# Patient Record
Sex: Female | Born: 1950
Health system: Southern US, Community
[De-identification: ages and names within clinical notes are randomized; demographics above are authoritative.]

## PROBLEM LIST (undated history)

## (undated) DIAGNOSIS — D649 Anemia, unspecified: Secondary | ICD-10-CM

## (undated) DIAGNOSIS — F32A Depression, unspecified: Secondary | ICD-10-CM

## (undated) DIAGNOSIS — T7840XA Allergy, unspecified, initial encounter: Secondary | ICD-10-CM

## (undated) DIAGNOSIS — E119 Type 2 diabetes mellitus without complications: Secondary | ICD-10-CM

## (undated) DIAGNOSIS — E785 Hyperlipidemia, unspecified: Secondary | ICD-10-CM

## (undated) DIAGNOSIS — I10 Essential (primary) hypertension: Secondary | ICD-10-CM

## (undated) DIAGNOSIS — N189 Chronic kidney disease, unspecified: Secondary | ICD-10-CM

## (undated) DIAGNOSIS — F329 Major depressive disorder, single episode, unspecified: Secondary | ICD-10-CM

## (undated) DIAGNOSIS — D631 Anemia in chronic kidney disease: Secondary | ICD-10-CM

## (undated) HISTORY — DX: Essential (primary) hypertension: I10

## (undated) HISTORY — DX: Chronic kidney disease, unspecified: N18.9

## (undated) HISTORY — DX: Allergy, unspecified, initial encounter: T78.40XA

## (undated) HISTORY — DX: Depression, unspecified: F32.A

## (undated) HISTORY — DX: Major depressive disorder, single episode, unspecified: F32.9

## (undated) HISTORY — DX: Hyperlipidemia, unspecified: E78.5

## (undated) HISTORY — DX: Anemia in chronic kidney disease: D63.1

## (undated) HISTORY — DX: Anemia, unspecified: D64.9

## (undated) HISTORY — PX: TUBAL LIGATION: SHX77

---

## 1999-12-31 ENCOUNTER — Emergency Department (HOSPITAL_COMMUNITY): Admission: EM | Admit: 1999-12-31 | Discharge: 1999-12-31 | Payer: Self-pay | Admitting: Emergency Medicine

## 2000-04-23 ENCOUNTER — Emergency Department (HOSPITAL_COMMUNITY): Admission: EM | Admit: 2000-04-23 | Discharge: 2000-04-23 | Payer: Self-pay | Admitting: Emergency Medicine

## 2000-05-08 ENCOUNTER — Encounter: Admission: RE | Admit: 2000-05-08 | Discharge: 2000-05-08 | Payer: Self-pay | Admitting: Internal Medicine

## 2001-12-27 ENCOUNTER — Emergency Department (HOSPITAL_COMMUNITY): Admission: EM | Admit: 2001-12-27 | Discharge: 2001-12-27 | Payer: Self-pay

## 2003-12-18 ENCOUNTER — Ambulatory Visit (HOSPITAL_COMMUNITY): Admission: RE | Admit: 2003-12-18 | Discharge: 2003-12-18 | Payer: Self-pay | Admitting: Internal Medicine

## 2004-02-25 ENCOUNTER — Ambulatory Visit: Payer: Self-pay | Admitting: *Deleted

## 2004-03-23 ENCOUNTER — Ambulatory Visit: Payer: Self-pay | Admitting: Family Medicine

## 2004-05-23 ENCOUNTER — Emergency Department (HOSPITAL_COMMUNITY): Admission: EM | Admit: 2004-05-23 | Discharge: 2004-05-23 | Payer: Self-pay | Admitting: Emergency Medicine

## 2004-05-28 ENCOUNTER — Ambulatory Visit: Payer: Self-pay | Admitting: Family Medicine

## 2004-06-08 ENCOUNTER — Ambulatory Visit: Payer: Self-pay | Admitting: Family Medicine

## 2004-08-06 ENCOUNTER — Ambulatory Visit: Payer: Self-pay | Admitting: Family Medicine

## 2004-08-11 ENCOUNTER — Encounter (HOSPITAL_BASED_OUTPATIENT_CLINIC_OR_DEPARTMENT_OTHER): Admission: RE | Admit: 2004-08-11 | Discharge: 2004-09-10 | Payer: Self-pay | Admitting: Internal Medicine

## 2005-01-14 ENCOUNTER — Ambulatory Visit: Payer: Self-pay | Admitting: Nurse Practitioner

## 2005-07-06 ENCOUNTER — Ambulatory Visit: Payer: Self-pay | Admitting: Family Medicine

## 2005-11-07 ENCOUNTER — Ambulatory Visit: Payer: Self-pay | Admitting: Family Medicine

## 2006-06-06 ENCOUNTER — Ambulatory Visit: Payer: Self-pay | Admitting: Family Medicine

## 2007-01-11 ENCOUNTER — Ambulatory Visit: Payer: Self-pay | Admitting: Family Medicine

## 2007-01-11 ENCOUNTER — Encounter (INDEPENDENT_AMBULATORY_CARE_PROVIDER_SITE_OTHER): Payer: Self-pay | Admitting: Nurse Practitioner

## 2007-01-11 LAB — CONVERTED CEMR LAB
Albumin: 4 g/dL (ref 3.5–5.2)
Basophils Absolute: 0 10*3/uL (ref 0.0–0.1)
Basophils Relative: 0 % (ref 0–1)
Chloride: 103 meq/L (ref 96–112)
Creatinine, Ser: 0.8 mg/dL (ref 0.40–1.20)
Eosinophils Relative: 2 % (ref 0–5)
Glucose, Bld: 223 mg/dL — ABNORMAL HIGH (ref 70–99)
Lymphocytes Relative: 34 % (ref 12–46)
Lymphs Abs: 1.9 10*3/uL (ref 0.7–3.3)
Neutro Abs: 3.2 10*3/uL (ref 1.7–7.7)
Platelets: 130 10*3/uL — ABNORMAL LOW (ref 150–400)
Sodium: 141 meq/L (ref 135–145)
Total Bilirubin: 0.9 mg/dL (ref 0.3–1.2)

## 2007-01-31 ENCOUNTER — Ambulatory Visit: Payer: Self-pay | Admitting: Internal Medicine

## 2007-02-27 DIAGNOSIS — N949 Unspecified condition associated with female genital organs and menstrual cycle: Secondary | ICD-10-CM

## 2007-02-27 DIAGNOSIS — N925 Other specified irregular menstruation: Secondary | ICD-10-CM | POA: Insufficient documentation

## 2007-02-27 DIAGNOSIS — E669 Obesity, unspecified: Secondary | ICD-10-CM

## 2007-02-27 DIAGNOSIS — G609 Hereditary and idiopathic neuropathy, unspecified: Secondary | ICD-10-CM | POA: Insufficient documentation

## 2007-02-27 DIAGNOSIS — E118 Type 2 diabetes mellitus with unspecified complications: Secondary | ICD-10-CM

## 2007-02-27 DIAGNOSIS — E785 Hyperlipidemia, unspecified: Secondary | ICD-10-CM | POA: Insufficient documentation

## 2007-02-27 DIAGNOSIS — I10 Essential (primary) hypertension: Secondary | ICD-10-CM

## 2007-03-07 ENCOUNTER — Encounter (INDEPENDENT_AMBULATORY_CARE_PROVIDER_SITE_OTHER): Payer: Self-pay | Admitting: *Deleted

## 2007-03-09 ENCOUNTER — Ambulatory Visit: Payer: Self-pay | Admitting: Internal Medicine

## 2007-04-04 ENCOUNTER — Ambulatory Visit: Payer: Self-pay | Admitting: Internal Medicine

## 2007-09-16 ENCOUNTER — Emergency Department (HOSPITAL_COMMUNITY): Admission: EM | Admit: 2007-09-16 | Discharge: 2007-09-16 | Payer: Self-pay | Admitting: Emergency Medicine

## 2007-09-18 ENCOUNTER — Ambulatory Visit: Payer: Self-pay | Admitting: Internal Medicine

## 2008-04-06 ENCOUNTER — Emergency Department (HOSPITAL_COMMUNITY): Admission: EM | Admit: 2008-04-06 | Discharge: 2008-04-06 | Payer: Self-pay | Admitting: Emergency Medicine

## 2008-04-17 ENCOUNTER — Inpatient Hospital Stay (HOSPITAL_COMMUNITY): Admission: AD | Admit: 2008-04-17 | Discharge: 2008-04-25 | Payer: Self-pay | Admitting: Cardiology

## 2008-04-17 ENCOUNTER — Encounter: Payer: Self-pay | Admitting: Emergency Medicine

## 2008-04-18 ENCOUNTER — Encounter (INDEPENDENT_AMBULATORY_CARE_PROVIDER_SITE_OTHER): Payer: Self-pay | Admitting: Cardiology

## 2008-05-29 ENCOUNTER — Encounter (INDEPENDENT_AMBULATORY_CARE_PROVIDER_SITE_OTHER): Payer: Self-pay | Admitting: Internal Medicine

## 2008-05-29 ENCOUNTER — Ambulatory Visit: Payer: Self-pay | Admitting: Family Medicine

## 2008-05-29 LAB — CONVERTED CEMR LAB
Basophils Relative: 0 % (ref 0–1)
Eosinophils Absolute: 0.2 10*3/uL (ref 0.0–0.7)
HCT: 37.2 % (ref 36.0–46.0)
Hemoglobin: 11.8 g/dL — ABNORMAL LOW (ref 12.0–15.0)
Lymphocytes Relative: 19 % (ref 12–46)
MCV: 70.6 fL — ABNORMAL LOW (ref 78.0–100.0)
Platelets: 168 10*3/uL (ref 150–400)
RBC: 5.27 M/uL — ABNORMAL HIGH (ref 3.87–5.11)
WBC: 11 10*3/uL — ABNORMAL HIGH (ref 4.0–10.5)

## 2008-06-05 ENCOUNTER — Ambulatory Visit: Payer: Self-pay | Admitting: Internal Medicine

## 2009-04-14 ENCOUNTER — Encounter (INDEPENDENT_AMBULATORY_CARE_PROVIDER_SITE_OTHER): Payer: Self-pay | Admitting: Internal Medicine

## 2009-04-14 ENCOUNTER — Ambulatory Visit: Payer: Self-pay | Admitting: Internal Medicine

## 2009-04-24 ENCOUNTER — Encounter (INDEPENDENT_AMBULATORY_CARE_PROVIDER_SITE_OTHER): Payer: Self-pay | Admitting: Internal Medicine

## 2009-04-24 ENCOUNTER — Ambulatory Visit: Payer: Self-pay | Admitting: Internal Medicine

## 2009-04-24 LAB — CONVERTED CEMR LAB
Cholesterol: 260 mg/dL — ABNORMAL HIGH (ref 0–200)
HDL: 63 mg/dL (ref >39)
LDL Cholesterol: 163 mg/dL — ABNORMAL HIGH (ref 0–99)
Total CHOL/HDL Ratio: 4.1
Triglycerides: 168 mg/dL — ABNORMAL HIGH (ref <150)
VLDL: 34 mg/dL (ref 0–40)

## 2009-04-29 ENCOUNTER — Ambulatory Visit: Payer: Self-pay | Admitting: Internal Medicine

## 2009-08-21 ENCOUNTER — Inpatient Hospital Stay (HOSPITAL_COMMUNITY): Admission: EM | Admit: 2009-08-21 | Discharge: 2009-08-24 | Payer: Self-pay | Admitting: Emergency Medicine

## 2009-08-21 ENCOUNTER — Ambulatory Visit: Payer: Self-pay | Admitting: Cardiology

## 2010-01-28 ENCOUNTER — Ambulatory Visit: Payer: Self-pay | Admitting: Internal Medicine

## 2010-09-12 LAB — CARDIAC PANEL(CRET KIN+CKTOT+MB+TROPI)
CK, MB: 0.7 ng/mL (ref 0.3–4.0)
CK, MB: 0.8 ng/mL (ref 0.3–4.0)
CK, MB: 0.9 ng/mL (ref 0.3–4.0)
CK, MB: 1 ng/mL (ref 0.3–4.0)
Relative Index: 0.8 (ref 0.0–2.5)
Relative Index: INVALID (ref 0.0–2.5)
Total CK: 105 U/L (ref 7–177)
Total CK: 86 U/L (ref 7–177)
Troponin I: 0.09 ng/mL — ABNORMAL HIGH (ref 0.00–0.06)
Troponin I: 0.1 ng/mL — ABNORMAL HIGH (ref 0.00–0.06)

## 2010-09-12 LAB — GLUCOSE, CAPILLARY
Glucose-Capillary: 137 mg/dL — ABNORMAL HIGH (ref 70–99)
Glucose-Capillary: 144 mg/dL — ABNORMAL HIGH (ref 70–99)
Glucose-Capillary: 216 mg/dL — ABNORMAL HIGH (ref 70–99)
Glucose-Capillary: 229 mg/dL — ABNORMAL HIGH (ref 70–99)
Glucose-Capillary: 247 mg/dL — ABNORMAL HIGH (ref 70–99)
Glucose-Capillary: 261 mg/dL — ABNORMAL HIGH (ref 70–99)
Glucose-Capillary: 272 mg/dL — ABNORMAL HIGH (ref 70–99)
Glucose-Capillary: 281 mg/dL — ABNORMAL HIGH (ref 70–99)
Glucose-Capillary: 333 mg/dL — ABNORMAL HIGH (ref 70–99)
Glucose-Capillary: 371 mg/dL — ABNORMAL HIGH (ref 70–99)
Glucose-Capillary: 382 mg/dL — ABNORMAL HIGH (ref 70–99)

## 2010-09-12 LAB — COMPREHENSIVE METABOLIC PANEL
ALT: 17 U/L (ref 0–35)
Albumin: 2.8 g/dL — ABNORMAL LOW (ref 3.5–5.2)
Alkaline Phosphatase: 75 U/L (ref 39–117)
BUN: 16 mg/dL (ref 6–23)
BUN: 16 mg/dL (ref 6–23)
BUN: 25 mg/dL — ABNORMAL HIGH (ref 6–23)
CO2: 27 mEq/L (ref 19–32)
Calcium: 8.3 mg/dL — ABNORMAL LOW (ref 8.4–10.5)
Calcium: 9 mg/dL (ref 8.4–10.5)
Chloride: 102 mEq/L (ref 96–112)
Creatinine, Ser: 0.87 mg/dL (ref 0.4–1.2)
Creatinine, Ser: 1.36 mg/dL — ABNORMAL HIGH (ref 0.4–1.2)
GFR calc non Af Amer: 46 mL/min — ABNORMAL LOW (ref >60)
Glucose, Bld: 264 mg/dL — ABNORMAL HIGH (ref 70–99)
Glucose, Bld: 316 mg/dL — ABNORMAL HIGH (ref 70–99)
Potassium: 3.4 mEq/L — ABNORMAL LOW (ref 3.5–5.1)
Potassium: 3.4 mEq/L — ABNORMAL LOW (ref 3.5–5.1)
Sodium: 135 mEq/L (ref 135–145)
Sodium: 136 mEq/L (ref 135–145)
Total Bilirubin: 1.2 mg/dL (ref 0.3–1.2)

## 2010-09-12 LAB — CBC
HCT: 38.5 % (ref 36.0–46.0)
Hemoglobin: 11.2 g/dL — ABNORMAL LOW (ref 12.0–15.0)
Hemoglobin: 11.9 g/dL — ABNORMAL LOW (ref 12.0–15.0)
Hemoglobin: 13 g/dL (ref 12.0–15.0)
MCHC: 30.4 g/dL (ref 30.0–36.0)
MCHC: 30.9 g/dL (ref 30.0–36.0)
MCHC: 32.9 g/dL (ref 30.0–36.0)
MCV: 71.6 fL — ABNORMAL LOW (ref 78.0–100.0)
MCV: 72.3 fL — ABNORMAL LOW (ref 78.0–100.0)
RBC: 4.81 MIL/uL (ref 3.87–5.11)
RBC: 5.27 MIL/uL — ABNORMAL HIGH (ref 3.87–5.11)
RDW: 15.2 % (ref 11.5–15.5)
RDW: 16.1 % — ABNORMAL HIGH (ref 11.5–15.5)
WBC: 6.8 10*3/uL (ref 4.0–10.5)

## 2010-09-12 LAB — BASIC METABOLIC PANEL
BUN: 16 mg/dL (ref 6–23)
CO2: 25 mEq/L (ref 19–32)
Calcium: 8.4 mg/dL (ref 8.4–10.5)
Creatinine, Ser: 0.76 mg/dL (ref 0.4–1.2)
Creatinine, Ser: 0.97 mg/dL (ref 0.4–1.2)
GFR calc Af Amer: >60 mL/min (ref >60)
Glucose, Bld: 206 mg/dL — ABNORMAL HIGH (ref 70–99)
Potassium: 3.5 mEq/L (ref 3.5–5.1)

## 2010-09-12 LAB — DIFFERENTIAL
Basophils Absolute: 0 10*3/uL (ref 0.0–0.1)
Basophils Absolute: 0.1 10*3/uL (ref 0.0–0.1)
Basophils Relative: 0 % (ref 0–1)
Eosinophils Absolute: 0.1 10*3/uL (ref 0.0–0.7)
Eosinophils Absolute: 0.1 10*3/uL (ref 0.0–0.7)
Eosinophils Absolute: 0.1 10*3/uL (ref 0.0–0.7)
Eosinophils Relative: 1 % (ref 0–5)
Lymphocytes Relative: 28 % (ref 12–46)
Lymphs Abs: 1.9 10*3/uL (ref 0.7–4.0)
Monocytes Absolute: 0.8 10*3/uL (ref 0.1–1.0)
Monocytes Relative: 10 % (ref 3–12)
Monocytes Relative: 11 % (ref 3–12)
Neutro Abs: 4 10*3/uL (ref 1.7–7.7)
Neutro Abs: 4.1 10*3/uL (ref 1.7–7.7)
Neutro Abs: 4.7 10*3/uL (ref 1.7–7.7)
Neutrophils Relative %: 59 % (ref 43–77)
Neutrophils Relative %: 61 % (ref 43–77)
Neutrophils Relative %: 61 % (ref 43–77)

## 2010-09-12 LAB — FOLATE: Folate: >20 ng/mL

## 2010-09-12 LAB — IRON AND TIBC
Saturation Ratios: 25 % (ref 20–55)
TIBC: 283 ug/dL (ref 250–470)

## 2010-09-12 LAB — MRSA PCR SCREENING: MRSA by PCR: NEGATIVE

## 2010-09-12 LAB — LIPID PANEL
HDL: 57 mg/dL (ref >39)
Triglycerides: 116 mg/dL (ref <150)
VLDL: 23 mg/dL (ref 0–40)

## 2010-09-12 LAB — POCT CARDIAC MARKERS

## 2010-09-12 LAB — TSH: TSH: 1.757 u[IU]/mL (ref 0.350–4.500)

## 2010-09-12 LAB — HEMOGLOBIN A1C: Hgb A1c MFr Bld: 14.4 % — ABNORMAL HIGH (ref 4.6–6.1)

## 2010-09-12 LAB — FERRITIN: Ferritin: 214 ng/mL (ref 10–291)

## 2010-11-02 NOTE — Consult Note (Signed)
NAMELAMETRIA, BIGALKE             ACCOUNT NO.:  [id number]   MEDICAL RECORD NO.:  [id number]          PATIENT TYPE:  INP   LOCATION:  [id number]                         FACILITY:  [facility]   PHYSICIAN:  Felizardo Hoffmann, M.D.  DATE OF BIRTH:  [birth year]   DATE OF CONSULTATION:  04/17/2008  DATE OF DISCHARGE:                                 CONSULTATION   REQUESTING PHYSICIAN:  Bryson Dames, M.D. of cardiology.   REASON FOR CONSULTATION:  Psychosis, agitation.   HISTORY OF PRESENT ILLNESS:  Phyllis Hernandez is a 60 year old female admitted  to the [facility] on October 29 due to chest pain, hyperglycemia and  hypertensive crisis.   She developed 4 days of acute mental status changes.  Her CBG was  initially 400.  She has been severely agitated, throwing objects.  She  has been making bizarre statements of delusions with ideas of reference  about channel 2 news but this has waxed and waned with her being calm 1  minute and then threatening the nurses the next minute.   Prior to admission her daughter found squalor at home where normally the  patient has things very neat.  The patient also was demonstrating very  poor self-care.  She was not taking her medications for days.  She had  developed paranoia.   FAMILY PSYCHIATRIC HISTORY:  None known.   PAST PSYCHIATRIC HISTORY:  The husband reported that 10 years prior the  patient had a major breakdown.  She does have a history of anxiety and  depression.   SOCIAL HISTORY:  Separated from husband.  No known alcohol or illegal  drugs.   PAST MEDICAL HISTORY:  No known drug allergies.  Hypertension, diabetes  mellitus.   MEDICATIONS:  Psychotropics.  MAR is reviewed.  Ambien 10 mg q.h.s.  p.r.n.   LABORATORY DATA:  Unremarkable.  Her QTC on serial EKGs has been running  between 500 and 520 milliseconds.  CT of the brain without contrast  showed no acute abnormalities.   REVIEW OF SYSTEMS:  Noncontributory.   PHYSICAL EXAMINATION:   VITAL SIGNS:  Temperature 98.4, pulse 114,  respiratory rate 32, blood pressure 225/118, O2 saturation on 2 liters  100%.  MENTAL STATUS:  Ms. Ross is lying in a supine position at the time of  exam with no abnormal involuntary movements.  She is mostly mute.  She  states that the month is September.  She is clearly having  hallucinations.  Thought process - disorganized; when asked what year it  is she states my year.  When she is asked about what month it is she  states [facility].  Her insight and judgment are impaired.  Her affect is  anxious.  Mood is anxious.  Memory is grossly impaired.  She does have  clouding of consciousness.   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  AXIS II:  Deferred.  AXIS III:  See past medical history.  AXIS IV:  General medical.  AXIS V:  15.   RECOMMENDATIONS:  1. Would discontinue the Xanax.  2. Ativan 2 mg b.i.d., hold for sedation.  3. Ativan 0.5 to 2 mg p.o. IM/IV q.1 hour p.r.n.  4. With the elevated QTC would allow more time for psychosis to clear      under general medical treatment and would consider proceeding with      cardiac procedures under sedation.   If the patient continues with severe psychosis with hallucinations,  ideas of reference, agitation, combativeness, the risks versus benefits  of starting Zyprexa would change to where the risk was worth the benefit  if the QTC was cleared by cardiology.   In that instance would utilize Zyprexa given that Zyprexa does have a  relatively lower risk of QTC prolongation compared to other  antipsychotics.  Zyprexa could be started at 2.5 mg p.o. or IM q.1600  with rechecking QTC.   If the QTC decreases in her hospital course this will make starting  Zyprexa much more indicated in the  context of continuing psychosis.   Would keep memory and orientation cues in the room and use low  stimulation ego support.  If the patient does continue with her  hallucinations, delusions, ideas of reference, agitation once she is  medically cleared would ask the social worker to obtain an inpatient  psychiatric bed for further evaluation and treatment.   If her psychosis clears and her mood is stable without suicidal ideation  or thoughts of harming others she could be discharged to one of the  outpatient clinics attached to [facility], [facility] or  [facility].      Felizardo Hoffmann, M.D.  Electronically Signed     JW/MEDQ  D:  04/18/2008  T:  04/18/2008  Job:  [id number]

## 2010-11-02 NOTE — Consult Note (Signed)
NAMECRESENCIA, Phyllis Hernandez             ACCOUNT NO.:  [id number]   MEDICAL RECORD NO.:  [id number]          PATIENT TYPE:  INP   LOCATION:  2013                         FACILITY:  [facility]   PHYSICIAN:  Felizardo Hoffmann, M.D.  DATE OF BIRTH:  [birth year]   DATE OF CONSULTATION:  04/24/2008  DATE OF DISCHARGE:                                 CONSULTATION   [name] has not required any antipsychotics.  She is not having any  thoughts of harming herself or others.  She is not having any delusions  or hallucinations.  Her hemoglobin is 9.9 and she is having significant  fatigue.  However, she has normal interests, future hope.  She looks  forward to getting back to church eventually and looks forward to  listening to church music and she also has benefited from fellowship  with church members and looks forward to talking to a chaplain in the  hospital.   [place]:  Phyllis Hernandez is alert.  Her eye contact is good.  Her  attention span is normal.  Her concentration is slightly decreased.  Affect is mildly flat at baseline but with a broad appropriate range.  Mood within normal limits.  She is oriented to all spheres.  Her memory  function is now intact to immediate recent and remote.  Speech is soft  but normal prosody.  There is no dysarthria.  Thought process is  logical, coherent, goal-directed.  No looseness of associations.  Thought content:  No thoughts of harming herself, no thoughts of harming  others, no delusions, no hallucinations.  Insight is good, judgment is  intact.   ASSESSMENT:  293.00  Delirium, not otherwise specified, now resolved.  Phyllis Hernandez agrees to call emergency services for any thoughts of harming  herself or others or any hallucinations.  She is psychiatrically cleared  for discharge.   No psychotropic medication needed unless she does reenter delirium  symptoms, which seems unlikely at this point of her general medical  course.   She is interested in ongoing  religious support and requests that a  chaplain come to see her while she is in the hospital.   There is no necessary scheduling of psychiatric follow-up as an  outpatient.  However, if desired, follow-up is available at one of the  clinics attached to [facility], [facility] or [facility].      Felizardo Hoffmann, M.D.  Electronically Signed     JW/MEDQ  D:  04/25/2008  T:  04/25/2008  Job:  [id number]

## 2010-11-02 NOTE — Discharge Summary (Signed)
NAMEHARKIRAT, DOBIAS             ACCOUNT NO.:  [id number]   MEDICAL RECORD NO.:  [id number]          PATIENT TYPE:  INP   LOCATION:  2013                         FACILITY:  [facility]   PHYSICIAN:  Shelva Majestic, M.D.     DATE OF BIRTH:  [birth year]   DATE OF ADMISSION:  04/17/2008  DATE OF DISCHARGE:  04/25/2008                               DISCHARGE SUMMARY   DISCHARGE DIAGNOSES:  1. Hypertensive crisis.  2. An ST elevation myocardial infarction secondary to hypertensive      crisis.  3. Cardiac catheterization revealing normal coronary arteries.  4. 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  She      has some minimal-to-moderate cavity obliteration.  Her ejection      fraction was greater than 70%.  5. Mitral valve prolapse.  6. Hypertension.  7. Hyperlipidemia.  8. Non-insulin-dependent diabetes mellitus.  The patient on the day of      discharge was placed back on p.o. medications.  When she was      admitted, her blood sugar was 435.  However, she had not taken any      of her medications for apparently quite some time.  She was placed      on Lantus insulin and sliding scale insulin.  However, after      repeated attempts at teaching her how to give herself an injection,      she was unable to do this.  Thus, we have put her back on p.o.      medications at this time.  9. Psychiatric disorder.  When she was admitted, she was agitated and      psychotic with hallucinations.  She was seen by Dr. Rhona Raider.  She      was stabilized.  She was placed on Ativan initially 2 mg b.i.d.      Her QTc was prolonged.  Thus, no antipsychotics were used.      Fortunately, she stabilized and hallucination ending.  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  10.Anemia.   LABORATORY DATA:  Hemoglobin 9.9, hematocrit 30.6, WBCs 8.0, and  platelets 124.  She received 1 unit of packed red blood cells on  April 23, 2008.  On April 23, 2008, hemoglobin 8.2, hematocrit 24.7,  WBC 7.9, and platelets 109.  Sodium 138, potassium 3.7, chloride 107,  CO2 of 24, glucose 130, BUN 5, and creatinine 0.80.  Ferritin 333.  Vitamin B12 is 895, total iron is 78, TIBC is 330, percent sat 24, and  UIBC is 252.  Head screen is negative.  Negative guaiac stool.  TSH  0.776 and a.m. cortisol 15.5.  Hemoglobin A1c is 12.4.  RPR is  nonreactive.  Total cholesterol is 166, triglycerides 81, HDL 49, and  LDL 101.  Chest x-ray, April 17, 2008, showed stable  mild  cardiomegaly, no acute cardiopulmonary disease.  CT of the head showed  no acute intracranial abnormalities, minimal and significant right  posterior ethmoid sinusitis.   DISCHARGE MEDICATIONS:  1. Lisinopril 5 mg daily.  2. Clonidine 0.1 mg twice per day.  3. Glipizide 5 mg twice per day.  4. Metformin 500 mg twice per day.  5. Aspirin 81 mg a day.  6. Pravachol 40 mg daily at bedtime.  7. Amlodipine 5 mg every day.  8. Lorazepam 1 mg 3 times per day.  9. Metoprolol 50 mg twice per day.   HOSPITAL COURSE:  Ms. Youngkin is a 60 year old African American female  patient who came into the emergency room at [facility] ED with slurred  speech, agitation, and hypertensive urgency with associated chest pain.  She stated that she had been treated at Urgent Care for flu symptoms for  approximately 2 weeks earlier.  At that time, her lipids and blood  sugars were good.  Later per her daughter, she stated that she had been  on a 40-day fast and had not been taking any of her medications for that  period of time.  She has apparently been under a lot of stress recently.  Her blood pressure on arrival to the ED was 220/174.  Nitroglycerin and  labetalol were given.  Her glucose was 450.  Her blood pressure came  down, and she  was given insulin.  Her blood pressure dropped to 120/75.  She apparently became more agitated and hysterical and a psychiatric  consult was called.  Her troponins were elevated.  Thus, she was  admitted to further rule out cardiac etiology.  She was also seen by  [facility] during her hospitalization for her diabetes.  She had  been started on an insulin drip.  This was discontinued when her blood  sugars were stabilized.  She was seen by Dr. Rhona Raider who recommended  stabilizing her with Ativan and because of her QTc not to give her any  antipsychotics, she did not stabilize, then a low dose of Zyprexa was  recommended.  However, the patient did stabilize and antipsychotics were  not used.  Her blood pressure did remain somewhat labile over the next  several days.  Medications were adjusted.  It was decided she should  undergo cardiac catheterization.  This was done on April 21, 2008, by  Dr. Shelva Majestic.  She had clean coronary arteries.  She had some  bridging of her LAD.  Dr. Claiborne Billings recommended adding Norvasc.  EF was  greater than 78%.  She did have some cavity obliteration and she also  had LVH and mitral valve prolapse per his preliminary report.  Please  see his complete report for details.  During her hospitalization, she  was seen by cardiac rehab team.  She was seen by nutrition.  She was  seen by the diabetic team, and she was seen by case managers and social  workers.  She has multiple social issues including problems with getting  health care because of her unemployed status and not having any finances  for her medications.  Apparently, per the patient, she has plight for  disability but has been denied.  On April 25, 2008, she was considered  stable for discharge home.  Her blood pressure was 133/81, heart rate  was 65, respirations were 18, temperature was 98.2, and O2 saturations  were 96%.  To prepare her for discharge, we again tried to teach her  how  to give  her insulin injection on the day of discharge, she was unable to  do this.  I called the [facility] and they recommended stopping  the insulin and putting her back on p.o. medications which we have done.  I also spoke with Dr. Rhona Raider about her Ativan.  She was on 2 mg  b.i.d.  He recommended decreasing it down to 1 mg 3 times per day and  that she should follow up with the [facility] first.  They will need to  help taper down her Ativan while she is getting cognitive behavioral  therapy through the [facility] at the [facility].  These appointments were made prior to her discharge, and we reiterated  again the importance of her going to the appointments that were made.  She has an appointment with [facility] on April 29, 2008, at 2:00  p.m. and with the [facility] Department on May 01, 2008,  at 12:45.  She was also told that if she does not go to these  appointments that we would not be able to give her any more anxiety  medications.  She was told also is she has any psychiatric or mental  crisis that she should just call 911 per  Dr. Daylene Katayama advice.  We have not made an appointment for her to be  seen back in Cardiology.  At this time, she has no coronary artery  disease.  If [facility] would like [place] to see her, they may refer her  back at any time.  They should be able to take care of her hypertension,  her diabetes, and her hyperlipidemia.      Cyndia Bent, N.P.    ______________________________  Shelva Majestic, M.D.    BB/MEDQ  D:  04/25/2008  T:  04/26/2008  Job:  [id number]   cc:   Felizardo Hoffmann, M.D.  [facility]

## 2010-11-02 NOTE — Cardiovascular Report (Signed)
NAMEWALDENE, HEGGER NO.:  [id number]   MEDICAL RECORD NO.:  [id number]          PATIENT TYPE:  INP   LOCATION:  2013                         FACILITY:  [facility]   PHYSICIAN:  Shelva Majestic, M.D.     DATE OF BIRTH:  [birth year]   DATE OF PROCEDURE:  DATE OF DISCHARGE:                            CARDIAC CATHETERIZATION   INDICATIONS:  Phyllis Hernandez is a 60 year old African American  female with a history of obesity, hypertension, diabetes mellitus, and  dyslipidemia.  She had been recently admitted with hypertensive urgency.  She did have some mild chest pain.  ECG showed LVH with nonspecific ST-T  changes.  Troponin was 0.13.  CK 251 and CK-MB 2.2.  The patient also  had mental status changes and also underwent psychiatric evaluation for  possible psychosis versus delirium.  She has stabilized.  She now is  referred for cardiac catheterization.   PROCEDURE:  After premedication with Versed 2 mg intravenously, the  patient was prepped and draped in the usual fashion.  Her right femoral  artery was punctured anteriorly and a 5-French sheath was inserted.  Diagnostic catheterization was done utilizing 5-French Judkins #4 left  and right coronary catheters.  A 200 mcg of intracoronary nitroglycerin  was selectively administered down the left coronary system to see if  this improved the midsystolic bridging that was noted.  A 5-French  pigtail catheter was used for biplane cine left ventriculography  as  well as distal aortography.  Distal aortography was done to make sure  she did not have any renovascular etiology to her hypertension and  recent hypertensive urgency.  Hemostasis was obtained by direct manual  pressure.  The patient tolerated the procedure well.   HEMODYNAMIC DATA:  Central aortic pressure was 120/63.  Left ventricular  pressure 120/13.   ANGIOGRAPHIC DATA:  The left main coronary artery was angiographically  normal and bifurcated into the LAD  and left circumflex system.   The LAD gave rise two major septal perforating arteries and one large  bifurcating diagonal vessel.  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  Following IC nitroglycerin, this did improve.  There was not any fixed  obstructive lesion.   Left circumflex coronary artery gave rise two marginal vessels and was  free of significant disease.   The right coronary artery was a large-caliber dominant vessel that  supplied the PDA and posterolateral vessel.   Biplane cine left ventriculography revealed left ventricular  hypertrophy.  The LV function was hyperdynamic and there was almost  midcavity obliteration.  Ejection fraction was greater than 70%.  There  was a suggestion of mild 1+ angiographic mitral valve prolapse.   Distal aortography did not reveal any renal artery stenosis.  There was  no significant aortoiliac disease.   IMPRESSION:  1. Left ventricle hypertrophy with hyperdynamic left ventricular      function and evidence for near moderate-to-mid systolic cavity      obliteration, ejection fraction greater than 70%.  2. Mild angiographic mitral valve prolapse.  3. 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   RECOMMENDATIONS:  Medical therapy.           ______________________________  Shelva Majestic, M.D.     TK/MEDQ  D:  04/21/2008  T:  04/22/2008  Job:  [id number]   cc:   Eden Lathe. Einar Gip, MD  [facility]

## 2010-11-02 NOTE — Consult Note (Signed)
NAMEERSIE, KUNTZ             ACCOUNT NO.:  [id number]   MEDICAL RECORD NO.:  [id number]          PATIENT TYPE:  INP   LOCATION:  [id number]                         FACILITY:  [facility]   PHYSICIAN:  Sharlet Salina, M.D.   DATE OF BIRTH:  04-19-1951   DATE OF CONSULTATION:  04/17/2008  DATE OF DISCHARGE:                                 CONSULTATION   Thanks to Dr. Einar Gip for this consult.   HISTORY OF PRESENT ILLNESS:  The patient is a 60 year old African  American female who was brought to the hospital by her family secondary  to chest pain.  The patient refuses to talk.  Most of the information  was from her daughter.  Per her daughter, she has been under a lot of  stress recently.  She has been separated from her husband and she has  moved in with her son, and her son causes her a lot of stress.  She also  has been trying to get medical help as far as medication for a while,  and has been unsuccessful.  She has been trying to get disability and  has not been successful.  Daughter states that she has been on a 40-day  fast and has not been taking her medications.  She stated that, when she  went over there to see her, yesterday evening, normally she has the  place well kept and everything was just everywhere, thrown all over the  place, which is very unusual for her.  The patient states that she is  under a lot of stress and depressed, and does not really want to talk.  She was admitted to the ER with elevated blood pressures and increased  troponin, and has been admitted to the cardiology service for further  workup, possibly cath.  The patient is not sure of what her dose of  Glucotrol or metformin are on an outpatient basis.  She has not taken  them for a while.   PAST MEDICAL HISTORY:  1. Significant for hypertension.  2. Diabetes.  3. Dyslipidemia.  4. Obesity.   FAMILY HISTORY:  Mother had heart failure, hypertension, diabetes.   SOCIAL HISTORY:  She does not smoke.  She  does not drink alcohol.  She  has 3 sons and a daughter, no tobacco or alcohol use, or any drug use.   MEDICATIONS:  1. Clonidine 0.1 twice daily.  2. Toprol XL 50 mg daily.  3. Glucotrol unsure of the dose.  4. Metformin unsure of the dose.   ALLERGIES:  No known drug allergies.   REVIEW OF SYSTEMS:  The patient complains of some diffuse abdominal pain  that has been going on for a long time and sometimes knee pain that has  been going on for a long time.  Otherwise, she is without any other  significant complaints.   PHYSICAL EXAM:  VITAL SIGNS:  Temperature 98.4, pulse 95, respirations  22, blood pressure 142/70, pulse ox 100% on 2L.  Blood sugar over 300.  GENERAL:  The patient is lying on the bed.  Does not appear to be in any  acute  distress.  HEENT:  Normocephalic, atraumatic.  Pupils are reactive to light.  CARDIOVASCULAR:  She is slightly tachycardic.  LUNGS:  Clear bilaterally.  ABDOMEN:  Soft, nontender, nondistended.  Positive bowel sounds.  EXTREMITIES:  Without edema.   LABORATORY STUDIES:  Head CT no acute disease.  Troponin 0.12.  Magnesium 1.8.  Sodium 142, potassium 4, chloride 103, CO2 26, glucose  339, BUN 12, creatinine 0.97.  TSH and lipid panel pending.   ASSESSMENT AND PLAN:  1. Diabetes.  2. Acute change in mental status.  3. Chest pain/non-ST-elevation myocardial infarction.  4. Hypertensive urgency.  5. Depression.  6. Anxiety.  7. Obesity.  8. Gilbert's syndrome.   PLAN:  To start on an insulin drip for stabilization of her blood sugar.  When blood sugar is stabilized, will switch her to Lantus on sliding  scale.  Prior to discharge, will change her back to her p.o. meds and  try to adjust accordingly.  Will check for her mental status changes.  I  discussed with family, it is very acute.  She normally is very pleasant,  never raises her voice, and all this just started yesterday, so we will  check RBC, folate, cortisol level.  TSH already  checked.  Will check B12  level for any possible cause of acute mental status changes.  Her head  CT was fine.  Will also get Dr. Rhona Raider to see her.  Discussed this  with her family.  Also, will get care manager to help with medications  as the family states it has been a problem for her to try to get some  help.      Sharlet Salina, M.D.  Electronically Signed     NJ/MEDQ  D:  04/17/2008  T:  04/17/2008  Job:  [id number]   cc:   Eden Lathe. Einar Gip, MD

## 2010-11-05 NOTE — Discharge Summary (Signed)
Phyllis Hernandez, Phyllis Hernandez             ACCOUNT NO.:  [id number]   MEDICAL RECORD NO.:  [id number]          PATIENT TYPE:  REC   LOCATION:  FOOT                         FACILITY:  [facility]   PHYSICIAN:  Epifania Gore. Nils Pyle, M.D.DATE OF BIRTH:  1951-03-05   DATE OF ADMISSION:  08/11/2004  DATE OF DISCHARGE:  09/09/2004                                 DISCHARGE SUMMARY   REFERRING DIAGNOSES:  Nonhealing ulceration right lower extremity.   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   HISTORY OF PRESENT ILLNESS:  [name] is a 60 year old female who is  initially referred for a right leg venous stasis ulcer.  Complicating  factors included psoriasis and diabetes.  The patient underwent a course of  leg wrappings utilizing both pro-4 and an Engineer, civil (consulting).  At the time  of this dictation, the ulcerations have completely resolved.   DISPOSITION:  We have counseled the patient regarding venous hypertension  and its relationship with the development of ulcerations.  We have  prescribed bilateral below the knee 30 to 40 mmHg compression hose.  We  provided the patient with a prescription and she will procure these garments  independently.  We expressed the willingness to reevaluate her for  recurrence of an ulceration.  In the interim, we have encouraged her to  continue to keep her appointments with [facility] for the management of  her medical problems including her diabetes and her psoriasis.  The patient  seems to understand and is appreciative.      HAN/MEDQ  D:  09/09/2004  T:  09/09/2004  Job:  [id number]   cc:   [facility]

## 2010-11-05 NOTE — Consult Note (Signed)
NAMEBRENLIE, Phyllis Hernandez             ACCOUNT NO.:  [id number]   MEDICAL RECORD NO.:  [id number]          PATIENT TYPE:  REC   LOCATION:  FOOT                         FACILITY:  [facility]   PHYSICIAN:  Epifania Gore. Nils Pyle, M.D.DATE OF BIRTH:  05/23/1951   DATE OF CONSULTATION:  08/11/2004  DATE OF DISCHARGE:                                   CONSULTATION   REASON FOR CONSULTATION:  Phyllis Hernandez is a 60 year old female referred by  the [facility] through Dr. __________ for evaluation of ulceration  of her right lower extremity.   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   RECOMMENDATIONS:  Moist compression wrap with debridement (sequential) and  if the above measures do not result in stabilization/resolution, we will  recommend proceeding with a skin biopsy and/or dermatologic consultation.   SUBJECTIVE:  Phyllis Hernandez is a 60 year old female who is unemployed who has  complained of severe itching and scaling of her right lower extremity.  These lesions have occurred over the past several years, have become  somewhat worse on the right lower extremity.  She has similar lesions  present on the left as well as on her trunk and the intertriginous areas.   PAST MEDICAL HISTORY:  1.  Hypertension.  2.  Diabetes.  3.  Exogenous obesity.  4.  She has been treated for venous stasis in the past.   PAST SURGICAL HISTORY:  Tubal ligation.   ALLERGIES:  She denies allergies.   CURRENT MEDICATIONS:  1.  Actos 30 mg daily.  2.  Prinzide 25 mg.  3.  Glucotrol XL 10 mg daily.  4.  Ferrous sulfate.  5.  Atarax.  6.  Triamcinolone cream.   FAMILY HISTORY:  Positive for diabetes, hypertension and stroke.   SOCIAL HISTORY:  She is single with children.   REVIEW OF SYSTEMS:  Remarkable in that the patient has never checked her  blood sugars at home.  She is somewhat elusive when questioned about the  ownership of a Glucometer or its  utilization.  She denies syncope. She  denies chest pain. She denies bowel or bladder dysfunction.  The remainder  of the review of systems is negative.   PHYSICAL EXAMINATION:  GENERAL:  She is alert with a flat affect.  EXTREMITIES:  Markedly abnormal. There is severe exfoliative changes in the  right lower extremity with areas of malodor and separation beneath an easily  separated layer of desquamated skin.  These areas were debrided without  difficulty and cleansed with antiseptic soap.  The pedal pulses are 3+  bilaterally. There is 1+ edema.  The protective sensation is preserved.   DISCUSSION:  We will proceed with a Unna Boot protocol, applying a  lubricating wetting agent directly to the skin.  The calamine __________ gel  should result in some relief of the itching.  Over the course of serial  changes and debridements, we should achieve a stabilization.  We have  explained this approach to management to the patient in terms that she seems  to understand.  She is agreeable with our recommendations  and appears to be  appreciative.      HAN/MEDQ  D:  08/12/2004  T:  08/12/2004  Job:  [id number]   cc:   [place], M.D.  [facility]

## 2011-03-21 LAB — POCT I-STAT, CHEM 8
BUN: 6
Chloride: 104
Creatinine, Ser: 0.8
Creatinine, Ser: 1.1
Glucose, Bld: 487 — ABNORMAL HIGH
Hemoglobin: 14.3
Potassium: 3.9
Potassium: 5.3 — ABNORMAL HIGH
Sodium: 138

## 2011-03-21 LAB — GLUCOSE, CAPILLARY
Glucose-Capillary: 174 — ABNORMAL HIGH
Glucose-Capillary: 182 — ABNORMAL HIGH
Glucose-Capillary: 214 — ABNORMAL HIGH
Glucose-Capillary: 229 — ABNORMAL HIGH
Glucose-Capillary: 232 — ABNORMAL HIGH
Glucose-Capillary: 232 — ABNORMAL HIGH
Glucose-Capillary: 255 — ABNORMAL HIGH
Glucose-Capillary: 272 — ABNORMAL HIGH
Glucose-Capillary: 295 — ABNORMAL HIGH
Glucose-Capillary: 309 — ABNORMAL HIGH

## 2011-03-21 LAB — CK TOTAL AND CKMB (NOT AT ARMC)
CK, MB: 1.8
CK, MB: 2.2
Relative Index: 0.8
Relative Index: 0.9
Relative Index: 0.9

## 2011-03-21 LAB — LIPID PANEL
LDL Cholesterol: 101 — ABNORMAL HIGH
Triglycerides: 81
VLDL: 16

## 2011-03-21 LAB — FOLATE: Folate: 14.9

## 2011-03-21 LAB — CBC
HCT: 33.4 — ABNORMAL LOW
HCT: 39.8
Hemoglobin: 10.6 — ABNORMAL LOW
Hemoglobin: 10.8 — ABNORMAL LOW
Hemoglobin: 12.7
MCHC: 31.5
MCHC: 31.6
MCHC: 31.9
MCV: 72.2 — ABNORMAL LOW
MCV: 72.3 — ABNORMAL LOW
RBC: 4.63
RBC: 4.74
RBC: 5.57 — ABNORMAL HIGH
RDW: 16.4 — ABNORMAL HIGH
WBC: 8.1

## 2011-03-21 LAB — BASIC METABOLIC PANEL
BUN: 17
CO2: 25
CO2: 25
CO2: 25
Calcium: 8.1 — ABNORMAL LOW
Calcium: 8.5
Calcium: 9.7
Chloride: 104
Chloride: 104
Creatinine, Ser: 1.19
Creatinine, Ser: 1.7 — ABNORMAL HIGH
Creatinine, Ser: 1.06
GFR calc Af Amer: 38 — ABNORMAL LOW
Glucose, Bld: 112 — ABNORMAL HIGH
Glucose, Bld: 472 — ABNORMAL HIGH
Sodium: 141

## 2011-03-21 LAB — DIFFERENTIAL
Basophils Relative: 0
Lymphocytes Relative: 21
Monocytes Relative: 9
Neutro Abs: 6.5

## 2011-03-21 LAB — COMPREHENSIVE METABOLIC PANEL
ALT: 20
AST: 24
Alkaline Phosphatase: 78
CO2: 26
Chloride: 103
Creatinine, Ser: 0.97
GFR calc Af Amer: >60
GFR calc non Af Amer: 60 — ABNORMAL LOW
Potassium: 4
Sodium: 142
Total Bilirubin: 2 — ABNORMAL HIGH

## 2011-03-21 LAB — POCT CARDIAC MARKERS: Troponin i, poc: <0.05

## 2011-03-21 LAB — HEPARIN LEVEL (UNFRACTIONATED): Heparin Unfractionated: 0.61

## 2011-03-21 LAB — CORTISOL-AM, BLOOD: Cortisol - AM: 15.5

## 2011-03-21 LAB — KETONES, QUALITATIVE: Acetone, Bld: NEGATIVE

## 2011-03-21 LAB — TROPONIN I: Troponin I: 0.13 — ABNORMAL HIGH

## 2011-03-21 LAB — IRON AND TIBC: Saturation Ratios: 24

## 2011-03-22 LAB — BASIC METABOLIC PANEL
BUN: 10
CO2: 21
CO2: 24
CO2: 25
Calcium: 8.2 — ABNORMAL LOW
Calcium: 8.8
Chloride: 107
Chloride: 107
Chloride: 112
GFR calc Af Amer: >60
GFR calc Af Amer: >60
GFR calc Af Amer: >60
GFR calc non Af Amer: >60
Glucose, Bld: 130 — ABNORMAL HIGH
Glucose, Bld: 156 — ABNORMAL HIGH
Potassium: 3.5
Potassium: 3.5
Potassium: 3.7
Sodium: 138
Sodium: 138
Sodium: 141

## 2011-03-22 LAB — CBC
HCT: 24.7 — ABNORMAL LOW
HCT: 30.6 — ABNORMAL LOW
HCT: 32 — ABNORMAL LOW
Hemoglobin: 10.2 — ABNORMAL LOW
Hemoglobin: 10.3 — ABNORMAL LOW
Hemoglobin: 8.2 — ABNORMAL LOW
Hemoglobin: 8.5 — ABNORMAL LOW
MCHC: 32.3
MCHC: 32.5
MCHC: 33.1
MCV: 72 — ABNORMAL LOW
MCV: 74.3 — ABNORMAL LOW
Platelets: 124 — ABNORMAL LOW
RBC: 3.43 — ABNORMAL LOW
RBC: 3.6 — ABNORMAL LOW
RBC: 4.42
RDW: 16 — ABNORMAL HIGH
RDW: 16.4 — ABNORMAL HIGH
WBC: 5.8
WBC: 8
WBC: 8.6

## 2011-03-22 LAB — IRON AND TIBC
Saturation Ratios: 24
TIBC: 330
UIBC: 252

## 2011-03-22 LAB — GLUCOSE, CAPILLARY
Glucose-Capillary: 165 — ABNORMAL HIGH
Glucose-Capillary: 166 — ABNORMAL HIGH
Glucose-Capillary: 170 — ABNORMAL HIGH
Glucose-Capillary: 172 — ABNORMAL HIGH
Glucose-Capillary: 181 — ABNORMAL HIGH
Glucose-Capillary: 202 — ABNORMAL HIGH
Glucose-Capillary: 206 — ABNORMAL HIGH
Glucose-Capillary: 210 — ABNORMAL HIGH
Glucose-Capillary: 211 — ABNORMAL HIGH
Glucose-Capillary: 212 — ABNORMAL HIGH
Glucose-Capillary: 293 — ABNORMAL HIGH

## 2011-03-22 LAB — DIFFERENTIAL
Basophils Relative: 0
Eosinophils Absolute: 0.1
Eosinophils Relative: 1
Lymphs Abs: 1.9
Neutrophils Relative %: 63

## 2011-03-22 LAB — CROSSMATCH

## 2011-03-22 LAB — VITAMIN B12: Vitamin B-12: 895 (ref 211–911)

## 2011-03-22 LAB — OCCULT BLOOD X 1 CARD TO LAB, STOOL: Fecal Occult Bld: NEGATIVE

## 2015-01-19 ENCOUNTER — Ambulatory Visit: Payer: Medicaid Other | Admitting: Dietician

## 2015-02-17 ENCOUNTER — Encounter: Payer: Medicaid Other | Attending: *Deleted | Admitting: Dietician

## 2015-02-17 ENCOUNTER — Encounter: Payer: Self-pay | Admitting: Dietician

## 2015-02-17 VITALS — Ht 64.0 in | Wt 214.0 lb

## 2015-02-17 DIAGNOSIS — Z713 Dietary counseling and surveillance: Secondary | ICD-10-CM | POA: Diagnosis not present

## 2015-02-17 DIAGNOSIS — E669 Obesity, unspecified: Secondary | ICD-10-CM | POA: Diagnosis not present

## 2015-02-17 DIAGNOSIS — Z794 Long term (current) use of insulin: Secondary | ICD-10-CM | POA: Diagnosis not present

## 2015-02-17 DIAGNOSIS — E118 Type 2 diabetes mellitus with unspecified complications: Secondary | ICD-10-CM | POA: Diagnosis not present

## 2015-02-17 DIAGNOSIS — Z6836 Body mass index (BMI) 36.0-36.9, adult: Secondary | ICD-10-CM | POA: Insufficient documentation

## 2015-02-17 NOTE — Progress Notes (Signed)
Diabetes Self-Management Education  Visit Type: First/Initial  Appt. Start Time: 1100 Appt. End Time: 1230  02/17/2015  [name], identified by name and date of birth, is a 64 y.o. female with a diagnosis of Diabetes: Type 2.   Patient is here alone.  She would like to lose weight to reduce her medication needs.  Hx includes type 2 diabetes since 2004 (HgbA1C is not known and patient is to get labs tomorrow.  It was >14 in 2011.)  HTN, hyperlipidemia, states that she needs a colonoscopy and has been referred to a nephrologist.  She also struggles with anemia.  She had   ASSESSMENT  Height 5\' 4"  (1.626 m), weight 214 lb (97.07 kg). Body mass index is 36.72 kg/(m^2).      Diabetes Self-Management Education - 02/17/15 1118    Visit Information   Visit Type First/Initial   Initial Visit   Diabetes Type Type 2   Are you currently following a meal plan? No  "just cutting back"   Are you taking your medications as prescribed? Yes   Date Diagnosed 2004   Health Coping   How would you rate your overall health? Fair   Psychosocial Assessment   Patient Belief/Attitude about Diabetes Motivated to manage diabetes  but also frustrated and afraid at times.  She is motivated to manage to get off some of the meds   Self-care barriers Unable to determine   Self-management support Doctor's office;Friends;Family   Other persons present Patient   Patient Concerns Weight Control;Other (comment);Nutrition/Meal planning  Anemia   Special Needs None   Preferred Learning Style No preference indicated   Learning Readiness Ready   How often do you need to have someone help you when you read instructions, pamphlets, or other written materials from your doctor or pharmacy? 2 - Rarely  sometimes does not understand what she reads.   What is the last grade level you completed in school? 11th grade education   Complications   How often do you check your blood sugar? 1-2 times/day  difficulty  getting strips because [employer] has been out of stock of the Relion brand   Fasting Blood glucose range (mg/dL) 70-129   Number of hypoglycemic episodes per month 3   Can you tell when your blood sugar is low? Yes   What do you do if your blood sugar is low? drink juice or eat   Number of hyperglycemic episodes per week 0   Have you had a dilated eye exam in the past 12 months? No   Have you had a dental exam in the past 12 months? No   Are you checking your feet? Yes   How many days per week are you checking your feet? 1   Dietary Intake   Breakfast skips if too busy (2 times per week), OR canteloup and cottage cheese OR mini bagel with strawberry cream cheese, [place] bacon or sausage and boiled egg OR soup (chicken noodle soup) and crackers and fruit OR applesauce and crackers this am so she could take meds   10-after 12   Snack (morning) none   Lunch homemade chicken salad with [employer] bread or crackers with banana   Snack (afternoon) container of regular banana pudding OR sugar free jello or popcorn   Dinner frozen meal ($1 each)  before 8:00 in front of TV in bedroom   Snack (evening) crackers   Beverage(s) water, sweet tea (1X per week), coffee with cream and splenda  Exercise   Exercise Type Other (comment);Light (walking / raking leaves)  squats and arm exercises occasionally.  has not been feeling well.   How many days per week to you exercise? 1   How many minutes per day do you exercise? 20   Total minutes per week of exercise 20   Patient Education   Previous Diabetes Education No   Disease state  Definition of diabetes, type 1 and 2, and the diagnosis of diabetes;Factors that contribute to the development of diabetes   Nutrition management  Role of diet in the treatment of diabetes and the relationship between the three main macronutrients and blood glucose level;Food label reading, portion sizes and measuring food.;Carbohydrate counting   Physical activity and exercise  Role of  exercise on diabetes management, blood pressure control and cardiac health.;Helped patient identify appropriate exercises in relation to his/her diabetes, diabetes complications and other health issue.   Monitoring Identified appropriate SMBG and/or A1C goals.;Daily foot exams;Yearly dilated eye exam   Acute complications Taught treatment of hypoglycemia - the 15 rule.   Chronic complications Relationship between chronic complications and blood glucose control;Dental care;Retinopathy and reason for yearly dilated eye exams   Psychosocial adjustment Role of stress on diabetes   Personal strategies to promote health Lifestyle issues that need to be addressed for better diabetes care   Individualized Goals (developed by patient)   Nutrition General guidelines for healthy choices and portions discussed;Follow meal plan discussed   Physical Activity Exercise 3-5 times per week;15 minutes per day;30 minutes per day   Medications take my medication as prescribed   Monitoring  test my blood glucose as discussed   Reducing Risk do foot checks daily;get labs drawn;treat hypoglycemia with 15 grams of carbs if blood glucose less than 70mg /dL   Outcomes   Expected Outcomes Demonstrated interest in learning. Expect positive outcomes   Future DMSE 4-6 wks   Program Status Completed      Individualized Plan for Diabetes Self-Management Training:   Learning Objective:  Patient will have a greater understanding of diabetes self-management. Patient education plan is to attend individual and/or group sessions per assessed needs and concerns.   Plan:   Patient Instructions  Avoid drinking carbohydrates. Consider walking.  Start with 15 minutes and increase as tolerated to 30 minutes most days of the week.   Aim for 2-3 Carb Choices per meal (30-45 grams) +/- 1 either way  Aim for 0-1 Carbs per snack if hungry  Include protein in moderation with your meals and snacks Consider reading food labels for Total  Carbohydrate and Fat Grams of foods Consider checking BG at alternate times per day as directed by MD  Consider taking medication as directed by MD    Expected Outcomes:  Demonstrated interest in learning. Expect positive outcomes  Education material provided: Living Well with Diabetes, Food label handouts, A1C conversion sheet, Meal plan card, My Plate and Snack sheet, Breakfast  If problems or questions, patient to contact team via:  Phone  Future DSME appointment: 4-6 wks

## 2015-02-17 NOTE — Patient Instructions (Signed)
Avoid drinking carbohydrates. Consider walking.  Start with 15 minutes and increase as tolerated to 30 minutes most days of the week.   Aim for 2-3 Carb Choices per meal (30-45 grams) +/- 1 either way  Aim for 0-1 Carbs per snack if hungry  Include protein in moderation with your meals and snacks Consider reading food labels for Total Carbohydrate and Fat Grams of foods Consider checking BG at alternate times per day as directed by MD  Consider taking medication as directed by MD

## 2015-03-04 ENCOUNTER — Other Ambulatory Visit: Payer: Self-pay | Admitting: Nephrology

## 2015-03-04 DIAGNOSIS — N184 Chronic kidney disease, stage 4 (severe): Secondary | ICD-10-CM

## 2015-03-11 ENCOUNTER — Other Ambulatory Visit: Payer: Medicaid Other

## 2015-04-07 ENCOUNTER — Ambulatory Visit: Payer: Medicaid Other | Admitting: Dietician

## 2015-06-23 ENCOUNTER — Other Ambulatory Visit: Payer: Medicaid Other

## 2016-02-29 ENCOUNTER — Other Ambulatory Visit (HOSPITAL_COMMUNITY): Payer: Self-pay | Admitting: *Deleted

## 2016-02-29 ENCOUNTER — Encounter: Payer: Self-pay | Admitting: Nephrology

## 2016-03-01 ENCOUNTER — Inpatient Hospital Stay (HOSPITAL_COMMUNITY): Admission: RE | Admit: 2016-03-01 | Payer: Medicaid Other | Source: Ambulatory Visit

## 2016-12-09 ENCOUNTER — Inpatient Hospital Stay (HOSPITAL_COMMUNITY)
Admission: EM | Admit: 2016-12-09 | Discharge: 2016-12-28 | DRG: 264 | Disposition: A | Discharge status: Home Health | Payer: Medicaid Other | Attending: Internal Medicine | Admitting: Internal Medicine

## 2016-12-09 ENCOUNTER — Encounter (HOSPITAL_COMMUNITY): Payer: Self-pay

## 2016-12-09 ENCOUNTER — Emergency Department (HOSPITAL_COMMUNITY): Payer: Medicaid Other

## 2016-12-09 DIAGNOSIS — E669 Obesity, unspecified: Secondary | ICD-10-CM | POA: Diagnosis present

## 2016-12-09 DIAGNOSIS — N184 Chronic kidney disease, stage 4 (severe): Secondary | ICD-10-CM | POA: Diagnosis not present

## 2016-12-09 DIAGNOSIS — Z419 Encounter for procedure for purposes other than remedying health state, unspecified: Secondary | ICD-10-CM

## 2016-12-09 DIAGNOSIS — N2581 Secondary hyperparathyroidism of renal origin: Secondary | ICD-10-CM | POA: Diagnosis present

## 2016-12-09 DIAGNOSIS — E1122 Type 2 diabetes mellitus with diabetic chronic kidney disease: Secondary | ICD-10-CM | POA: Diagnosis present

## 2016-12-09 DIAGNOSIS — D631 Anemia in chronic kidney disease: Secondary | ICD-10-CM | POA: Diagnosis present

## 2016-12-09 DIAGNOSIS — I5033 Acute on chronic diastolic (congestive) heart failure: Secondary | ICD-10-CM | POA: Diagnosis present

## 2016-12-09 DIAGNOSIS — I161 Hypertensive emergency: Secondary | ICD-10-CM | POA: Diagnosis present

## 2016-12-09 DIAGNOSIS — Z833 Family history of diabetes mellitus: Secondary | ICD-10-CM

## 2016-12-09 DIAGNOSIS — J810 Acute pulmonary edema: Secondary | ICD-10-CM

## 2016-12-09 DIAGNOSIS — N186 End stage renal disease: Secondary | ICD-10-CM

## 2016-12-09 DIAGNOSIS — N185 Chronic kidney disease, stage 5: Secondary | ICD-10-CM | POA: Diagnosis not present

## 2016-12-09 DIAGNOSIS — E118 Type 2 diabetes mellitus with unspecified complications: Secondary | ICD-10-CM | POA: Diagnosis present

## 2016-12-09 DIAGNOSIS — I248 Other forms of acute ischemic heart disease: Secondary | ICD-10-CM | POA: Diagnosis present

## 2016-12-09 DIAGNOSIS — K5900 Constipation, unspecified: Secondary | ICD-10-CM

## 2016-12-09 DIAGNOSIS — D696 Thrombocytopenia, unspecified: Secondary | ICD-10-CM | POA: Diagnosis not present

## 2016-12-09 DIAGNOSIS — E871 Hypo-osmolality and hyponatremia: Secondary | ICD-10-CM | POA: Diagnosis present

## 2016-12-09 DIAGNOSIS — I10 Essential (primary) hypertension: Secondary | ICD-10-CM | POA: Diagnosis not present

## 2016-12-09 DIAGNOSIS — E872 Acidosis, unspecified: Secondary | ICD-10-CM

## 2016-12-09 DIAGNOSIS — Z7984 Long term (current) use of oral hypoglycemic drugs: Secondary | ICD-10-CM

## 2016-12-09 DIAGNOSIS — E785 Hyperlipidemia, unspecified: Secondary | ICD-10-CM | POA: Diagnosis present

## 2016-12-09 DIAGNOSIS — Z7982 Long term (current) use of aspirin: Secondary | ICD-10-CM | POA: Diagnosis not present

## 2016-12-09 DIAGNOSIS — N171 Acute kidney failure with acute cortical necrosis: Secondary | ICD-10-CM | POA: Diagnosis not present

## 2016-12-09 DIAGNOSIS — I509 Heart failure, unspecified: Secondary | ICD-10-CM | POA: Diagnosis not present

## 2016-12-09 DIAGNOSIS — Z794 Long term (current) use of insulin: Secondary | ICD-10-CM | POA: Diagnosis not present

## 2016-12-09 DIAGNOSIS — N179 Acute kidney failure, unspecified: Secondary | ICD-10-CM | POA: Diagnosis present

## 2016-12-09 DIAGNOSIS — R778 Other specified abnormalities of plasma proteins: Secondary | ICD-10-CM

## 2016-12-09 DIAGNOSIS — N189 Chronic kidney disease, unspecified: Secondary | ICD-10-CM

## 2016-12-09 DIAGNOSIS — Z6836 Body mass index (BMI) 36.0-36.9, adult: Secondary | ICD-10-CM

## 2016-12-09 DIAGNOSIS — Z8249 Family history of ischemic heart disease and other diseases of the circulatory system: Secondary | ICD-10-CM

## 2016-12-09 DIAGNOSIS — I132 Hypertensive heart and chronic kidney disease with heart failure and with stage 5 chronic kidney disease, or end stage renal disease: Secondary | ICD-10-CM | POA: Diagnosis present

## 2016-12-09 DIAGNOSIS — R7989 Other specified abnormal findings of blood chemistry: Secondary | ICD-10-CM

## 2016-12-09 DIAGNOSIS — F329 Major depressive disorder, single episode, unspecified: Secondary | ICD-10-CM | POA: Diagnosis present

## 2016-12-09 DIAGNOSIS — I701 Atherosclerosis of renal artery: Secondary | ICD-10-CM | POA: Diagnosis not present

## 2016-12-09 DIAGNOSIS — Z01810 Encounter for preprocedural cardiovascular examination: Secondary | ICD-10-CM | POA: Diagnosis not present

## 2016-12-09 DIAGNOSIS — R601 Generalized edema: Secondary | ICD-10-CM | POA: Diagnosis not present

## 2016-12-09 DIAGNOSIS — Z9851 Tubal ligation status: Secondary | ICD-10-CM | POA: Diagnosis not present

## 2016-12-09 DIAGNOSIS — N183 Chronic kidney disease, stage 3 (moderate): Secondary | ICD-10-CM | POA: Diagnosis not present

## 2016-12-09 DIAGNOSIS — Z9889 Other specified postprocedural states: Secondary | ICD-10-CM

## 2016-12-09 DIAGNOSIS — E1165 Type 2 diabetes mellitus with hyperglycemia: Secondary | ICD-10-CM | POA: Diagnosis present

## 2016-12-09 DIAGNOSIS — D638 Anemia in other chronic diseases classified elsewhere: Secondary | ICD-10-CM

## 2016-12-09 DIAGNOSIS — N3289 Other specified disorders of bladder: Secondary | ICD-10-CM | POA: Diagnosis present

## 2016-12-09 DIAGNOSIS — E1121 Type 2 diabetes mellitus with diabetic nephropathy: Secondary | ICD-10-CM | POA: Diagnosis not present

## 2016-12-09 DIAGNOSIS — R748 Abnormal levels of other serum enzymes: Secondary | ICD-10-CM | POA: Diagnosis not present

## 2016-12-09 DIAGNOSIS — Z452 Encounter for adjustment and management of vascular access device: Secondary | ICD-10-CM

## 2016-12-09 DIAGNOSIS — N170 Acute kidney failure with tubular necrosis: Secondary | ICD-10-CM | POA: Diagnosis not present

## 2016-12-09 DIAGNOSIS — Z992 Dependence on renal dialysis: Secondary | ICD-10-CM

## 2016-12-09 DIAGNOSIS — Z79899 Other long term (current) drug therapy: Secondary | ICD-10-CM

## 2016-12-09 LAB — COMPREHENSIVE METABOLIC PANEL
ALT: 21 U/L (ref 14–54)
AST: 24 U/L (ref 15–41)
Albumin: 3.6 g/dL (ref 3.5–5.0)
Alkaline Phosphatase: 73 U/L (ref 38–126)
Anion gap: 12 (ref 5–15)
BILIRUBIN TOTAL: 0.6 mg/dL (ref 0.3–1.2)
BUN: 57 mg/dL — AB (ref 6–20)
CO2: 18 mmol/L — ABNORMAL LOW (ref 22–32)
CREATININE: 5.16 mg/dL — AB (ref 0.44–1.00)
Calcium: 9.3 mg/dL (ref 8.9–10.3)
Chloride: 107 mmol/L (ref 101–111)
GFR, EST AFRICAN AMERICAN: 9 mL/min — AB (ref >60)
GFR, EST NON AFRICAN AMERICAN: 8 mL/min — AB (ref >60)
Glucose, Bld: 219 mg/dL — ABNORMAL HIGH (ref 65–99)
POTASSIUM: 4.9 mmol/L (ref 3.5–5.1)
Sodium: 137 mmol/L (ref 135–145)
TOTAL PROTEIN: 8 g/dL (ref 6.5–8.1)

## 2016-12-09 LAB — GLUCOSE, CAPILLARY
GLUCOSE-CAPILLARY: 213 mg/dL — AB (ref 65–99)
Glucose-Capillary: 198 mg/dL — ABNORMAL HIGH (ref 65–99)
Glucose-Capillary: 170 mg/dL — ABNORMAL HIGH (ref 65–99)
Glucose-Capillary: 185 mg/dL — ABNORMAL HIGH (ref 65–99)

## 2016-12-09 LAB — I-STAT ARTERIAL BLOOD GAS, ED
Acid-base deficit: 8 mmol/L — ABNORMAL HIGH (ref 0.0–2.0)
Bicarbonate: 17.5 mmol/L — ABNORMAL LOW (ref 20.0–28.0)
O2 SAT: 100 %
PH ART: 7.29 — AB (ref 7.350–7.450)
TCO2: 19 mmol/L (ref 0–100)
pCO2 arterial: 36.4 mmHg (ref 32.0–48.0)
pO2, Arterial: 195 mmHg — ABNORMAL HIGH (ref 83.0–108.0)

## 2016-12-09 LAB — CBC WITH DIFFERENTIAL/PLATELET
Basophils Absolute: 0 10*3/uL (ref 0.0–0.1)
Basophils Relative: 0 %
Eosinophils Absolute: 0.1 10*3/uL (ref 0.0–0.7)
Eosinophils Relative: 1 %
HCT: 34.4 % — ABNORMAL LOW (ref 36.0–46.0)
Hemoglobin: 10.9 g/dL — ABNORMAL LOW (ref 12.0–15.0)
LYMPHS ABS: 1.4 10*3/uL (ref 0.7–4.0)
LYMPHS PCT: 14 %
MCH: 22.6 pg — AB (ref 26.0–34.0)
MCHC: 31.7 g/dL (ref 30.0–36.0)
MCV: 71.2 fL — AB (ref 78.0–100.0)
MONO ABS: 0.4 10*3/uL (ref 0.1–1.0)
Monocytes Relative: 3 %
Neutro Abs: 8.5 10*3/uL — ABNORMAL HIGH (ref 1.7–7.7)
Neutrophils Relative %: 82 %
PLATELETS: 148 10*3/uL — AB (ref 150–400)
RBC: 4.83 MIL/uL (ref 3.87–5.11)
RDW: 16.1 % — AB (ref 11.5–15.5)
WBC: 10.4 10*3/uL (ref 4.0–10.5)

## 2016-12-09 LAB — I-STAT CHEM 8, ED
BUN: 55 mg/dL — ABNORMAL HIGH (ref 6–20)
CALCIUM ION: 1.16 mmol/L (ref 1.15–1.40)
CHLORIDE: 109 mmol/L (ref 101–111)
Creatinine, Ser: 5.3 mg/dL — ABNORMAL HIGH (ref 0.44–1.00)
Glucose, Bld: 219 mg/dL — ABNORMAL HIGH (ref 65–99)
HEMATOCRIT: 36 % (ref 36.0–46.0)
Hemoglobin: 12.2 g/dL (ref 12.0–15.0)
Potassium: 4.8 mmol/L (ref 3.5–5.1)
SODIUM: 140 mmol/L (ref 135–145)
TCO2: 20 mmol/L (ref 0–100)

## 2016-12-09 LAB — LACTIC ACID, PLASMA
LACTIC ACID, VENOUS: 3.1 mmol/L — AB (ref 0.5–1.9)
Lactic Acid, Venous: 3.4 mmol/L (ref 0.5–1.9)

## 2016-12-09 LAB — MRSA PCR SCREENING: MRSA by PCR: NEGATIVE

## 2016-12-09 LAB — I-STAT TROPONIN, ED: TROPONIN I, POC: 0.01 ng/mL (ref 0.00–0.08)

## 2016-12-09 LAB — I-STAT CG4 LACTIC ACID, ED: Lactic Acid, Venous: 2.2 mmol/L (ref 0.5–1.9)

## 2016-12-09 LAB — BRAIN NATRIURETIC PEPTIDE: B Natriuretic Peptide: 170.3 pg/mL — ABNORMAL HIGH (ref 0.0–100.0)

## 2016-12-09 LAB — CG4 I-STAT (LACTIC ACID): Lactic Acid, Venous: 2.1 mmol/L (ref 0.5–1.9)

## 2016-12-09 LAB — TROPONIN I
TROPONIN I: 0.13 ng/mL — AB (ref <0.03)
Troponin I: 0.13 ng/mL (ref <0.03)

## 2016-12-09 MED ORDER — INSULIN ASPART 100 UNIT/ML ~~LOC~~ SOLN
0.0000 [IU] | Freq: Every day | SUBCUTANEOUS | Status: DC
  Administered 2016-12-10 – 2016-12-12 (×2): 2 [IU] via SUBCUTANEOUS
  Administered 2016-12-13 – 2016-12-14 (×2): 3 [IU] via SUBCUTANEOUS
  Administered 2016-12-15: 2 [IU] via SUBCUTANEOUS
  Administered 2016-12-16: 3 [IU] via SUBCUTANEOUS
  Administered 2016-12-17 – 2016-12-27 (×6): 2 [IU] via SUBCUTANEOUS

## 2016-12-09 MED ORDER — INSULIN ASPART 100 UNIT/ML ~~LOC~~ SOLN
0.0000 [IU] | Freq: Three times a day (TID) | SUBCUTANEOUS | Status: DC
  Administered 2016-12-09: 5 [IU] via SUBCUTANEOUS
  Administered 2016-12-09 (×2): 3 [IU] via SUBCUTANEOUS
  Administered 2016-12-10: 8 [IU] via SUBCUTANEOUS
  Administered 2016-12-10: 5 [IU] via SUBCUTANEOUS
  Administered 2016-12-10: 3 [IU] via SUBCUTANEOUS
  Administered 2016-12-11: 5 [IU] via SUBCUTANEOUS
  Administered 2016-12-11 (×2): 3 [IU] via SUBCUTANEOUS
  Administered 2016-12-12: 5 [IU] via SUBCUTANEOUS
  Administered 2016-12-12: 3 [IU] via SUBCUTANEOUS
  Administered 2016-12-12: 2 [IU] via SUBCUTANEOUS
  Administered 2016-12-13: 3 [IU] via SUBCUTANEOUS
  Administered 2016-12-13: 8 [IU] via SUBCUTANEOUS
  Administered 2016-12-13: 3 [IU] via SUBCUTANEOUS
  Administered 2016-12-14: 11 [IU] via SUBCUTANEOUS
  Administered 2016-12-14: 3 [IU] via SUBCUTANEOUS
  Administered 2016-12-14: 2 [IU] via SUBCUTANEOUS
  Administered 2016-12-15 (×2): 3 [IU] via SUBCUTANEOUS
  Administered 2016-12-15: 8 [IU] via SUBCUTANEOUS
  Administered 2016-12-17: 5 [IU] via SUBCUTANEOUS
  Administered 2016-12-17: 8 [IU] via SUBCUTANEOUS
  Administered 2016-12-18: 5 [IU] via SUBCUTANEOUS
  Administered 2016-12-18 – 2016-12-19 (×3): 3 [IU] via SUBCUTANEOUS
  Administered 2016-12-19: 2 [IU] via SUBCUTANEOUS
  Administered 2016-12-19: 3 [IU] via SUBCUTANEOUS
  Administered 2016-12-20: 2 [IU] via SUBCUTANEOUS
  Administered 2016-12-20: 5 [IU] via SUBCUTANEOUS
  Administered 2016-12-20: 2 [IU] via SUBCUTANEOUS
  Administered 2016-12-21 (×2): 3 [IU] via SUBCUTANEOUS
  Administered 2016-12-21: 2 [IU] via SUBCUTANEOUS
  Administered 2016-12-22: 3 [IU] via SUBCUTANEOUS
  Administered 2016-12-22: 5 [IU] via SUBCUTANEOUS
  Administered 2016-12-23 (×2): 2 [IU] via SUBCUTANEOUS
  Administered 2016-12-24: 3 [IU] via SUBCUTANEOUS
  Administered 2016-12-24 – 2016-12-25 (×2): 2 [IU] via SUBCUTANEOUS
  Administered 2016-12-25: 3 [IU] via SUBCUTANEOUS
  Administered 2016-12-26: 5 [IU] via SUBCUTANEOUS
  Administered 2016-12-26: 3 [IU] via SUBCUTANEOUS
  Administered 2016-12-26 – 2016-12-27 (×2): 2 [IU] via SUBCUTANEOUS
  Administered 2016-12-27: 5 [IU] via SUBCUTANEOUS

## 2016-12-09 MED ORDER — NITROGLYCERIN IN D5W 200-5 MCG/ML-% IV SOLN
10.0000 ug/min | INTRAVENOUS | Status: DC
  Administered 2016-12-09 (×2): 10 ug/min via INTRAVENOUS
  Administered 2016-12-10: 70 ug/min via INTRAVENOUS
  Administered 2016-12-10: 80 ug/min via INTRAVENOUS
  Filled 2016-12-09 (×4): qty 250

## 2016-12-09 MED ORDER — POTASSIUM CHLORIDE CRYS ER 20 MEQ PO TBCR
40.0000 meq | EXTENDED_RELEASE_TABLET | Freq: Every day | ORAL | Status: DC
  Administered 2016-12-09: 40 meq via ORAL
  Filled 2016-12-09: qty 2

## 2016-12-09 MED ORDER — ENOXAPARIN SODIUM 30 MG/0.3ML ~~LOC~~ SOLN
30.0000 mg | Freq: Every day | SUBCUTANEOUS | Status: DC
  Administered 2016-12-09 – 2016-12-11 (×4): 30 mg via SUBCUTANEOUS
  Filled 2016-12-09 (×4): qty 0.3

## 2016-12-09 MED ORDER — ACETAMINOPHEN 325 MG PO TABS
650.0000 mg | ORAL_TABLET | Freq: Four times a day (QID) | ORAL | Status: DC | PRN
  Administered 2016-12-11 – 2016-12-25 (×2): 650 mg via ORAL
  Filled 2016-12-09 (×2): qty 2

## 2016-12-09 MED ORDER — HYDRALAZINE HCL 20 MG/ML IJ SOLN
10.0000 mg | Freq: Once | INTRAMUSCULAR | Status: AC
  Administered 2016-12-09: 10 mg via INTRAVENOUS
  Filled 2016-12-09: qty 1

## 2016-12-09 MED ORDER — ORAL CARE MOUTH RINSE
15.0000 mL | Freq: Two times a day (BID) | OROMUCOSAL | Status: DC
  Administered 2016-12-09 – 2016-12-27 (×20): 15 mL via OROMUCOSAL

## 2016-12-09 MED ORDER — FUROSEMIDE 10 MG/ML IJ SOLN
80.0000 mg | Freq: Every day | INTRAMUSCULAR | Status: DC
  Administered 2016-12-09 – 2016-12-10 (×2): 80 mg via INTRAVENOUS
  Filled 2016-12-09 (×2): qty 8

## 2016-12-09 MED ORDER — FUROSEMIDE 10 MG/ML IJ SOLN
40.0000 mg | Freq: Once | INTRAMUSCULAR | Status: AC
  Administered 2016-12-09: 40 mg via INTRAVENOUS
  Filled 2016-12-09: qty 4

## 2016-12-09 MED ORDER — PRAVASTATIN SODIUM 40 MG PO TABS
40.0000 mg | ORAL_TABLET | Freq: Every day | ORAL | Status: DC
  Administered 2016-12-09 – 2016-12-28 (×20): 40 mg via ORAL
  Filled 2016-12-09 (×20): qty 1

## 2016-12-09 MED ORDER — SODIUM CHLORIDE 0.9% FLUSH
3.0000 mL | Freq: Two times a day (BID) | INTRAVENOUS | Status: DC
  Administered 2016-12-09 – 2016-12-27 (×32): 3 mL via INTRAVENOUS

## 2016-12-09 MED ORDER — ASPIRIN 81 MG PO CHEW
81.0000 mg | CHEWABLE_TABLET | Freq: Every day | ORAL | Status: DC
  Administered 2016-12-09 – 2016-12-28 (×20): 81 mg via ORAL
  Filled 2016-12-09 (×20): qty 1

## 2016-12-09 MED ORDER — ACETAMINOPHEN 650 MG RE SUPP: 650.0000 mg | Freq: Four times a day (QID) | RECTAL | Status: DC | PRN

## 2016-12-09 NOTE — Progress Notes (Signed)
Lab called critical lactic acid 3.1. Improved from 3.4

## 2016-12-09 NOTE — H&P (Signed)
History and Physical  Patient Name: Phyllis Hernandez     ZJQ:[id number]    DOB: 1950/08/01    DOA: 12/09/2016 PCP: Everardo Beals, NP  Patient coming from: Home  Chief Complaint: Dyspnea      HPI: Phyllis Hernandez is a 66 y.o. female with a past medical history significant for NIDDM and HTN and CKD IV? who presents with acute dyspnea.  The patient was in her usual state of health until a week or so ago when she was talking on the phone, had an episode which she could hardly breathing had to sit down, and the feeling slowly passed by itself.  Since then she has been fine, had noticed a little leg swelling yesterday, but has been taking her BP meds normally, going about her normal business today (went to the store, walked around, did some housework, all without difficulty) then tonight around midnight, she was getting ready for bed and started to feel SOB, sweaty and nervous.  She tried to cough but couldn't cough anything up, and the SOB got worse and worse until she panicked and called 9-1-1.  EMS found her SpO2 70% and BP 250/140 and transported.  ED course: -Afebrile, heart rate 91, respirations 25, blood pressure 253/143, pulse oximetry 100% on BiPAP -Na 137, K 4.9, Cr 5.16 (baseline unknown, she thinks she recalls "4" from Dr. Sanda Klein office), WBC 10K, Hgb 10.9 -Troponin normal -BNP 170 -Lactate 2.2 -CXR showed edema -ECG showed sinus tachycardia -She was started on Nitroglycerin gtt and given IV Lasix, her breathing was comfortable off BiPAP,  and TRH were asked to evaluate for hypertensive urgency/emergency     ROS: Review of Systems  Constitutional: Negative for chills, fever and malaise/fatigue.  Respiratory: Positive for shortness of breath. Negative for cough and sputum production.   Cardiovascular: Positive for leg swelling. Negative for chest pain, orthopnea and PND.  Genitourinary: Negative for dysuria, frequency and urgency.  All other systems reviewed and are  negative.         Past Medical History:  Diagnosis Date  . Allergy   . Anemia   . Anemia in CKD (chronic kidney disease)   . Depression   . Hyperlipidemia   . Hypertension     Past Surgical History:  Procedure Laterality Date  . TUBAL LIGATION      Social History: Patient lives with her son.  The patient walks unassisted.  Nonsmoker.  She is from [place].  Worked in Ambulance person.  No Known Allergies  Family history: family history includes Diabetes in her maternal grandmother and mother; Hypertension in her maternal grandmother and mother.  Prior to Admission medications   Medication Sig Start Date End Date Taking? Authorizing Provider  amLODipine (NORVASC) 10 MG tablet Take 10 mg by mouth daily.   Yes [provider]  aspirin 81 MG chewable tablet Chew 81 mg by mouth daily.   Yes [provider]  ferrous sulfate 325 (65 FE) MG tablet Take 325 mg by mouth daily with breakfast.   Yes [provider]  glipiZIDE (GLUCOTROL) 10 MG tablet Take 10 mg by mouth 2 (two) times daily before a meal.   Yes [provider]  indapamide (LOZOL) 1.25 MG tablet Take 1.25 mg by mouth daily.   Yes [provider]  metFORMIN (GLUCOPHAGE) 1000 MG tablet Take 1,000 mg by mouth 2 (two) times daily with a meal.   Yes [provider]  metoprolol (LOPRESSOR) 50 MG tablet Take 50 mg  by mouth 2 (two) times daily.   Yes [provider]  pravastatin (PRAVACHOL) 40 MG tablet Take 40 mg by mouth daily.   Yes [provider]       Physical Exam: BP (!) 203/97 (BP Location: Right Arm)   Pulse 89   Temp 98.7 F (37.1 C) (Oral)   Resp (!) 29   Ht 5\' 5"  (1.651 m)   Wt 102 kg (224 lb 12.8 oz)   SpO2 97%   BMI 37.41 kg/m  General appearance: Well-developed, obese adult female, alert and in mild distress from dyspnea.   Eyes: Anicteric, conjunctiva pink, lids and lashes normal. PERRL.    ENT: No nasal deformity, discharge,  epistaxis.  Hearing normal. OP moist without lesions.   Neck: No neck masses.  Trachea midline.  No thyromegaly/tenderness. Lymph: No cervical or supraclavicular lymphadenopathy. Skin: Warm and dry.  No suspicious rashes or lesions. Cardiac: RRR, nl S1-S2, no murmurs appreciated.  Capillary refill is brisk.  EJs distended.  1+ LE edema.  Radial and DP pulses 2+ and symmetric. Respiratory: Normal respiratory rate and rhythm.  No wheezing.  Crackles at bases. Abdomen: Abdomen soft.  No TTP. No ascites, distension, hepatosplenomegaly.   MSK: No deformities or effusions.  No cyanosis or clubbing. Neuro: Cranial nerves normal.  Sensation intact to light touch. Speech is fluent.  Muscle strength normal.    Psych: Sensorium intact and responding to questions, attention normal.  Behavior appropriate.  Affect nromal.  Judgment and insight appear normal, memory seems a little impaired "out of it".     Labs on Admission:  I have personally reviewed following labs and imaging studies: CBC:  Recent Labs Lab 12/09/16 0148 12/09/16 0424  WBC  --  10.4  NEUTROABS  --  8.5*  HGB 12.2 10.9*  HCT 36.0 34.4*  MCV  --  71.2*  PLT  --  409*   Basic Metabolic Panel:  Recent Labs Lab 12/09/16 0148  NA 137  140  K 4.9  4.8  CL 107  109  CO2 18*  GLUCOSE 219*  219*  BUN 57*  55*  CREATININE 5.16*  5.30*  CALCIUM 9.3   GFR: Estimated Creatinine Clearance: 13.2 mL/min (A) (by C-G formula based on SCr of 5.16 mg/dL (H)).  Liver Function Tests:  Recent Labs Lab 12/09/16 0148  AST 24  ALT 21  ALKPHOS 73  BILITOT 0.6  PROT 8.0  ALBUMIN 3.6   Sepsis Labs: Lactate 2.2      Radiological Exams on Admission: Personally reviewed CXR shows edema: Dg Chest Portable 1 View  Result Date: 12/09/2016 CLINICAL DATA:  Acute onset of respiratory distress. Initial encounter. EXAM: PORTABLE CHEST 1 VIEW COMPARISON:  Chest radiograph performed 04/17/2008 FINDINGS: The lungs are well-aerated.  Vascular congestion is noted. Bilateral airspace opacification likely reflects pulmonary edema. No pleural effusion or pneumothorax is seen. The cardiomediastinal silhouette is borderline enlarged. No acute osseous abnormalities are seen. A loose body is noted at the inferior right glenoid. IMPRESSION: Vascular congestion and borderline cardiomegaly. Bilateral airspace opacification likely reflects pulmonary edema. Electronically Signed   By: Garald Balding M.D.   On: 12/09/2016 01:34    EKG: Independently reviewed. Rate 101, QTc 479, sinus.  Echocardiogram 2004: Report reviewed EF 55-60% Moderate concentric LVH        Assessment/Plan  1. Hypertensive emergency with acute diastolic CHF:    -Nitroglycerin gtt -Goal BP 20% reduction reached already, goal additional 15% reduction to 170/100 with nitroglycerin gtt  over next 23 hours -Obtain echocardiogram -Lasix 100 mg IV  -Strict I/Os, daily weights -Daily BMP -Cycle enzymes   2. Acute on chronic renal failure:  Unclear baseline in our records, she suspects baseline Cr is ~4?  Follows with nephrology.  Mild non-gap acidosis. -Consult to Nephrology  3. Hypertension:  -Hold indapamide, metoprolol, amlodipine for now pending nitro goals above -Continue aspirin, statin  4. Diabetes:  -Hold orals -SSI with meals           DVT prophylaxis: Lovenox  Code Status: FULL  Family Communication: Sons at bedside  Disposition Plan: Anticipate IV nitro drip, goal reduction to 170/100 for now, can begin oral BP meds over next 24 hours Consults called: Nephrology Admission status: INPATIENT    Medical decision making: Patient seen at 3:30 AM on 12/09/2016.  The patient was discussed with Dr. Leonides Schanz.  What exists of the patient's chart was reviewed in depth and summarized above.  Clinical condition: stable, improving.        Edwin Dada [facility] Pager [phone]

## 2016-12-09 NOTE — Consult Note (Signed)
[facility] Consult Note     Date: 12/09/2016                  Patient Name:  Phyllis Hernandez  MRN: [id number]  DOB: 1950-10-25  Age / Sex: 66 y.o., female         PCP: Everardo Beals, NP                 Service Requesting Consult: [facility] Dr Sherral Hammers                 Reason for Consult: Acute kidney injury superimposed on CKD            Chief Complaint: dyspnea  HPI: Pt is a 72F with a PMH significant for HTN, DM II, and dyspnea who is now seen in consultation at the request of Dr. Sherral Hammers for evaluation and recommendations surrounding acute on chronic kidney injury.    It appears that pt's baseline creatinine is 2.67 noted in our clinic 01/2016.    Pt presented with dyspnea and HTNsive emergency.  She's on a nitro gtt.  Her creatinine on admission was 5.3.  She's making urine.  She notes that her legs were swelling about a week ago.    She doesn't have any complaints now.  No CP, HA, SOB.    Past Medical History:  Diagnosis Date  . Allergy   . Anemia   . Anemia in CKD (chronic kidney disease)   . Depression   . Hyperlipidemia   . Hypertension     Past Surgical History:  Procedure Laterality Date  . TUBAL LIGATION      Family History  Problem Relation Age of Onset  . Hypertension Mother   . Diabetes Mother   . Hypertension Maternal Grandmother   . Diabetes Maternal Grandmother    Social History:  reports that she has never smoked. She has never used smokeless tobacco. Her alcohol and drug histories are not on file.  Allergies: No Known Allergies  Medications Prior to Admission  Medication Sig Dispense Refill  . amLODipine (NORVASC) 10 MG tablet Take 10 mg by mouth daily.    Marland Kitchen aspirin 81 MG chewable tablet Chew 81 mg by mouth daily.    . ferrous sulfate 325 (65 FE) MG tablet Take 325 mg by mouth daily with breakfast.    . glipiZIDE (GLUCOTROL) 10 MG tablet Take 10 mg by mouth 2 (two) times daily before a meal.    . indapamide  (LOZOL) 1.25 MG tablet Take 1.25 mg by mouth daily.    . metFORMIN (GLUCOPHAGE) 1000 MG tablet Take 1,000 mg by mouth 2 (two) times daily with a meal.    . metoprolol (LOPRESSOR) 50 MG tablet Take 50 mg by mouth 2 (two) times daily.    . pravastatin (PRAVACHOL) 40 MG tablet Take 40 mg by mouth daily.      Results for orders placed or performed during the hospital encounter of 12/09/16 (from the past 48 hour(s))  I-Stat Arterial Blood Gas, ED - (order at [facility] and [facility] only)     Status: Abnormal   Collection Time: 12/09/16  1:33 AM  Result Value Ref Range   pH, Arterial 7.290 (L) 7.350 - 7.450   pCO2 arterial 36.4 32.0 - 48.0 mmHg   pO2, Arterial 195.0 (H) 83.0 - 108.0 mmHg   Bicarbonate 17.5 (L) 20.0 - 28.0 mmol/L   TCO2 19 0 - 100 mmol/L   O2 Saturation 100.0 %  Acid-base deficit 8.0 (H) 0.0 - 2.0 mmol/L   Patient temperature 98.6 F    Collection site RADIAL, ALLEN'S TEST ACCEPTABLE    Drawn by RT    Sample type ARTERIAL   I-stat troponin, ED     Status: None   Collection Time: 12/09/16  1:47 AM  Result Value Ref Range   Troponin i, poc 0.01 0.00 - 0.08 ng/mL   Comment 3            Comment: Due to the release kinetics of cTnI, a negative result within the first hours of the onset of symptoms does not rule out myocardial infarction with certainty. If myocardial infarction is still suspected, repeat the test at appropriate intervals.   Comprehensive metabolic panel     Status: Abnormal   Collection Time: 12/09/16  1:48 AM  Result Value Ref Range   Sodium 137 135 - 145 mmol/L   Potassium 4.9 3.5 - 5.1 mmol/L   Chloride 107 101 - 111 mmol/L   CO2 18 (L) 22 - 32 mmol/L   Glucose, Bld 219 (H) 65 - 99 mg/dL   BUN 57 (H) 6 - 20 mg/dL   Creatinine, Ser 5.16 (H) 0.44 - 1.00 mg/dL   Calcium 9.3 8.9 - 10.3 mg/dL   Total Protein 8.0 6.5 - 8.1 g/dL   Albumin 3.6 3.5 - 5.0 g/dL   AST 24 15 - 41 U/L   ALT 21 14 - 54 U/L   Alkaline Phosphatase 73 38 - 126 U/L   Total Bilirubin 0.6 0.3 -  1.2 mg/dL   GFR calc non Af Amer 8 (L) >60 mL/min   GFR calc Af Amer 9 (L) >60 mL/min    Comment: (NOTE) The eGFR has been calculated using the CKD EPI equation. This calculation has not been validated in all clinical situations. eGFR's persistently <60 mL/min signify possible Chronic Kidney Disease.    Anion gap 12 5 - 15  I-stat chem 8, ed     Status: Abnormal   Collection Time: 12/09/16  1:48 AM  Result Value Ref Range   Sodium 140 135 - 145 mmol/L   Potassium 4.8 3.5 - 5.1 mmol/L   Chloride 109 101 - 111 mmol/L   BUN 55 (H) 6 - 20 mg/dL   Creatinine, Ser 5.30 (H) 0.44 - 1.00 mg/dL   Glucose, Bld 219 (H) 65 - 99 mg/dL   Calcium, Ion 1.16 1.15 - 1.40 mmol/L   TCO2 20 0 - 100 mmol/L   Hemoglobin 12.2 12.0 - 15.0 g/dL   HCT 36.0 36.0 - 46.0 %  Brain natriuretic peptide     Status: Abnormal   Collection Time: 12/09/16  1:48 AM  Result Value Ref Range   B Natriuretic Peptide 170.3 (H) 0.0 - 100.0 pg/mL  I-Stat CG4 Lactic Acid, ED     Status: Abnormal   Collection Time: 12/09/16  1:49 AM  Result Value Ref Range   Lactic Acid, Venous 2.20 (HH) 0.5 - 1.9 mmol/L   Comment NOTIFIED PHYSICIAN   CBC with Differential/Platelet     Status: Abnormal   Collection Time: 12/09/16  4:24 AM  Result Value Ref Range   WBC 10.4 4.0 - 10.5 K/uL   RBC 4.83 3.87 - 5.11 MIL/uL   Hemoglobin 10.9 (L) 12.0 - 15.0 g/dL   HCT 34.4 (L) 36.0 - 46.0 %   MCV 71.2 (L) 78.0 - 100.0 fL   MCH 22.6 (L) 26.0 - 34.0 pg   MCHC 31.7 30.0 -  36.0 g/dL   RDW 16.1 (H) 11.5 - 15.5 %   Platelets 148 (L) 150 - 400 K/uL   Neutrophils Relative % 82 %   Neutro Abs 8.5 (H) 1.7 - 7.7 K/uL   Lymphocytes Relative 14 %   Lymphs Abs 1.4 0.7 - 4.0 K/uL   Monocytes Relative 3 %   Monocytes Absolute 0.4 0.1 - 1.0 K/uL   Eosinophils Relative 1 %   Eosinophils Absolute 0.1 0.0 - 0.7 K/uL   Basophils Relative 0 %   Basophils Absolute 0.0 0.0 - 0.1 K/uL  CG4 I-STAT (Lactic acid)     Status: Abnormal   Collection Time:  12/09/16  4:33 AM  Result Value Ref Range   Lactic Acid, Venous 2.10 (HH) 0.5 - 1.9 mmol/L   Comment NOTIFIED PHYSICIAN   MRSA PCR Screening     Status: None   Collection Time: 12/09/16  4:55 AM  Result Value Ref Range   MRSA by PCR NEGATIVE NEGATIVE    Comment:        The GeneXpert MRSA Assay (FDA approved for NASAL specimens only), is one component of a comprehensive MRSA colonization surveillance program. It is not intended to diagnose MRSA infection nor to guide or monitor treatment for MRSA infections.   Glucose, capillary     Status: Abnormal   Collection Time: 12/09/16  7:20 AM  Result Value Ref Range   Glucose-Capillary 213 (H) 65 - 99 mg/dL  Troponin I (q 6hr x 3)     Status: Abnormal   Collection Time: 12/09/16 10:29 AM  Result Value Ref Range   Troponin I 0.13 (HH) <0.03 ng/mL    Comment: CRITICAL RESULT CALLED TO, READ BACK BY AND VERIFIED WITH: TURNER,L RN @ 2549 12/09/16 LEONARD,A   Glucose, capillary     Status: Abnormal   Collection Time: 12/09/16 11:07 AM  Result Value Ref Range   Glucose-Capillary 198 (H) 65 - 99 mg/dL   Dg Chest Portable 1 View  Result Date: 12/09/2016 CLINICAL DATA:  Acute onset of respiratory distress. Initial encounter. EXAM: PORTABLE CHEST 1 VIEW COMPARISON:  Chest radiograph performed 04/17/2008 FINDINGS: The lungs are well-aerated. Vascular congestion is noted. Bilateral airspace opacification likely reflects pulmonary edema. No pleural effusion or pneumothorax is seen. The cardiomediastinal silhouette is borderline enlarged. No acute osseous abnormalities are seen. A loose body is noted at the inferior right glenoid. IMPRESSION: Vascular congestion and borderline cardiomegaly. Bilateral airspace opacification likely reflects pulmonary edema. Electronically Signed   By: Garald Balding M.D.   On: 12/09/2016 01:34    ROS: all other systems reviewed and are negative except as per HPI  Blood pressure (!) 186/92, pulse 98, temperature 98.8  F (37.1 C), temperature source Oral, resp. rate 19, height 5' 5" (1.651 m), weight 102 kg (224 lb 12.8 oz), SpO2 94 %. Physical Exam  GEN NAD, lying in bed HEENT sclerae anicteric NECK no JVD PULM scattered crackles CV RRR ABD soft EXT 2+ LE edema NEURO nonfocal SKIN no rashes  Assessment/Plan  1.  AKI on CKD: likely due to [facility] emergency.  Fortunately is making urine.  Expect some improvement with control of BP.  No uremic symptoms, no indication for dialysis at present.    2.  HTNsive emergency: pt reports taking meds.  Home meds include indapamide, amlodipine, metop.  Agree with Lasix as you are doing.  Could consider switching metop for coreg when oral meds started.     3.  DM II: per primary  4.  Troponemia: likely demand.  Trending, per primary  Madelon Lips, MD [facility] pgr [phone] 12/09/2016, 4:19 PM

## 2016-12-09 NOTE — ED Triage Notes (Signed)
Pt comes from home via [facility], became SOB, rales in all fields. PTA received 2 nitro, denies CP

## 2016-12-09 NOTE — Progress Notes (Signed)
PROGRESS NOTE    Phyllis Hernandez  OEU:[id number] DOB: 06/26/1950 DOA: 12/09/2016 PCP: Everardo Beals, NP   Brief Narrative:  Phyllis Hernandez is a 66 y.o. BF PMHx NIDDM and HTN and CKD IV? who presents with acute dyspnea.  The patient was in her usual state of health until a week or so ago when she was talking on the phone, had an episode which she could hardly breathing had to sit down, and the feeling slowly passed by itself.  Since then she has been fine, had noticed a little leg swelling yesterday, but has been taking her BP meds normally, going about her normal business today (went to the store, walked around, did some housework, all without difficulty) then tonight around midnight, she was getting ready for bed and started to feel SOB, sweaty and nervous.  She tried to cough but couldn't cough anything up, and the SOB got worse and worse until she panicked and called 9-1-1.  EMS found her SpO2 70% and BP 250/140 and transported.  ED course: -Afebrile, heart rate 91, respirations 25, blood pressure 253/143, pulse oximetry 100% on BiPAP -Na 137, K 4.9, Cr 5.16 (baseline unknown, she thinks she recalls "4" from Dr. Sanda Klein office), WBC 10K, Hgb 10.9 -Troponin normal -BNP 170 -Lactate 2.2 -CXR showed edema -ECG showed sinus tachycardia -She was started on Nitroglycerin gtt and given IV Lasix, her breathing was comfortable off BiPAP,  and TRH were asked to evaluate for hypertensive urgency/emergency   Subjective: Patient admitted on 6/22 A/O 4, negative CP, negative SOB, negative N/V. States was taking her BP medication appropriately. Not on home O2.   Assessment & Plan:   Principal Problem:   Acute on chronic renal failure (HCC) Active Problems:   Type 2 diabetes mellitus with complication, without long-term current use of insulin ([place])   Essential hypertension   Hypertensive emergency   1. Hypertensive emergency with acute diastolic CHF:    -Nitroglycerin  gtt -Goal BP 20% reduction reached already, goal additional 15% reduction to 170/100 with nitroglycerin gtt over next 23 hours -Obtain echocardiogram -Lasix 100 mg IV  -Strict I/Os, daily weights -Daily BMP -Cycle enzymes   2. Acute on chronic renal failure:  Unclear baseline in our records, she suspects baseline Cr is ~4?  Follows with nephrology.  Mild non-gap acidosis. -Consult to Nephrology  3. Hypertension:  -Hold indapamide, metoprolol, amlodipine for now pending nitro goals above -Continue aspirin, statin  4. Diabetes:  -Hold orals -SSI with meals   DVT prophylaxis: Lovenox Code Status: Full Family Communication: None Disposition Plan: TBD   Consultants:  None  Procedures/Significant Events:  None   VENTILATOR SETTINGS: None   Cultures None  Antimicrobials: Anti-infectives    None       Devices None   LINES / TUBES:  None    Continuous Infusions: . nitroGLYCERIN 10 mcg/min (12/09/16 1606)     Objective: Vitals:   12/09/16 0845 12/09/16 1149 12/09/16 1414 12/09/16 1944  BP: (!) 184/89 (!) 170/89 (!) 186/92 (!) 166/102  Pulse: 78 98  91  Resp:  19  20  Temp: 97.7 F (36.5 C)  98.8 F (37.1 C) 99.2 F (37.3 C)  TempSrc: Oral  Oral Oral  SpO2:  94%  99%  Weight:      Height:        Intake/Output Summary (Last 24 hours) at 12/09/16 2055 Last data filed at 12/09/16 0854  Gross per 24 hour  Intake  513.73 ml  Output             1700 ml  Net         -1186.27 ml   Filed Weights   12/09/16 0453  Weight: 224 lb 12.8 oz (102 kg)    Examination:  General: A/O 4, No acute respiratory distress Eyes: negative scleral hemorrhage, negative anisocoria, negative icterus ENT: Negative Runny nose, negative gingival bleeding, Neck:  Negative scars, masses, torticollis, lymphadenopathy, JVD Lungs: Clear to auscultation bilaterally without wheezes or crackles Cardiovascular: Regular rate and rhythm without murmur gallop or  rub normal S1 and S2 Abdomen: negative abdominal pain, nondistended, positive soft, bowel sounds, no rebound, no ascites, no appreciable mass Extremities: No significant cyanosis, clubbing, or edema bilateral lower extremities  .     Data Reviewed: Care during the described time interval was provided by me .  I have reviewed this patient's available data, including medical history, events of note, physical examination, and all test results as part of my evaluation. I have personally reviewed and interpreted all radiology studies.  CBC:  Recent Labs Lab 12/09/16 0148 12/09/16 0424  WBC  --  10.4  NEUTROABS  --  8.5*  HGB 12.2 10.9*  HCT 36.0 34.4*  MCV  --  71.2*  PLT  --  569*   Basic Metabolic Panel:  Recent Labs Lab 12/09/16 0148  NA 137  140  K 4.9  4.8  CL 107  109  CO2 18*  GLUCOSE 219*  219*  BUN 57*  55*  CREATININE 5.16*  5.30*  CALCIUM 9.3   GFR: Estimated Creatinine Clearance: 13.2 mL/min (A) (by C-G formula based on SCr of 5.16 mg/dL (H)). Liver Function Tests:  Recent Labs Lab 12/09/16 0148  AST 24  ALT 21  ALKPHOS 73  BILITOT 0.6  PROT 8.0  ALBUMIN 3.6   No results for input(s): LIPASE, AMYLASE in the last 168 hours. No results for input(s): AMMONIA in the last 168 hours. Coagulation Profile: No results for input(s): INR, PROTIME in the last 168 hours. Cardiac Enzymes:  Recent Labs Lab 12/09/16 1029 12/09/16 1800  TROPONINI 0.13* 0.13*   BNP (last 3 results) No results for input(s): PROBNP in the last 8760 hours. HbA1C: No results for input(s): HGBA1C in the last 72 hours. CBG:  Recent Labs Lab 12/09/16 0720 12/09/16 1107 12/09/16 1619  GLUCAP 213* 198* 170*   Lipid Profile: No results for input(s): CHOL, HDL, LDLCALC, TRIG, CHOLHDL, LDLDIRECT in the last 72 hours. Thyroid Function Tests: No results for input(s): TSH, T4TOTAL, FREET4, T3FREE, THYROIDAB in the last 72 hours. Anemia Panel: No results for input(s):  VITAMINB12, FOLATE, FERRITIN, TIBC, IRON, RETICCTPCT in the last 72 hours. Urine analysis: No results found for: COLORURINE, APPEARANCEUR, LABSPEC, PHURINE, GLUCOSEU, HGBUR, BILIRUBINUR, KETONESUR, PROTEINUR, UROBILINOGEN, NITRITE, LEUKOCYTESUR Sepsis Labs: @LABRCNTIP (procalcitonin:4,lacticidven:4)  ) Recent Results (from the past 240 hour(s))  MRSA PCR Screening     Status: None   Collection Time: 12/09/16  4:55 AM  Result Value Ref Range Status   MRSA by PCR NEGATIVE NEGATIVE Final    Comment:        The GeneXpert MRSA Assay (FDA approved for NASAL specimens only), is one component of a comprehensive MRSA colonization surveillance program. It is not intended to diagnose MRSA infection nor to guide or monitor treatment for MRSA infections.          Radiology Studies: Dg Chest Portable 1 View  Result Date: 12/09/2016 CLINICAL DATA:  Acute  onset of respiratory distress. Initial encounter. EXAM: PORTABLE CHEST 1 VIEW COMPARISON:  Chest radiograph performed 04/17/2008 FINDINGS: The lungs are well-aerated. Vascular congestion is noted. Bilateral airspace opacification likely reflects pulmonary edema. No pleural effusion or pneumothorax is seen. The cardiomediastinal silhouette is borderline enlarged. No acute osseous abnormalities are seen. A loose body is noted at the inferior right glenoid. IMPRESSION: Vascular congestion and borderline cardiomegaly. Bilateral airspace opacification likely reflects pulmonary edema. Electronically Signed   By: Garald Balding M.D.   On: 12/09/2016 01:34        Scheduled Meds: . aspirin  81 mg Oral Daily  . enoxaparin (LOVENOX) injection  30 mg Subcutaneous Daily  . furosemide  80 mg Intravenous Daily  . insulin aspart  0-15 Units Subcutaneous TID WC  . insulin aspart  0-5 Units Subcutaneous QHS  . mouth rinse  15 mL Mouth Rinse BID  . potassium chloride  40 mEq Oral Daily  . pravastatin  40 mg Oral Daily  . sodium chloride flush  3 mL  Intravenous Q12H   Continuous Infusions: . nitroGLYCERIN 10 mcg/min (12/09/16 1606)     LOS: 0 days    Time spent: 10 minutes    Tyke Outman, Geraldo Docker, MD [facility] Pager [phone]   If 7PM-7AM, please contact night-coverage [email or web address] Password [email or web address] 12/09/2016, 8:55 PM

## 2016-12-09 NOTE — ED Notes (Signed)
Attempted report 

## 2016-12-09 NOTE — ED Notes (Signed)
Admitting at bedside 

## 2016-12-09 NOTE — Progress Notes (Signed)
Reviewed elevated troponin with Dr Clydene Laming, also reviewed BP, nitroglycerin drip.

## 2016-12-09 NOTE — ED Provider Notes (Signed)
By signing my name below, I, Theresia Bough, attest that this documentation has been prepared under the direction and in the presence of Akayla Brass, Delice Bison, DO. Electronically Signed: Theresia Bough, ED Scribe. 12/09/16. 1:29 AM.  TIME SEEN: 1:20 AM  CHIEF COMPLAINT: SOB  HPI: HPI Comments: Phyllis Hernandez is a 66 y.o. female with a PMHx of HTN, chronic kidney disease who presents to the Emergency Department via EMS, complaining of constant, severe SOB onset this evening. Pt reports associated dry cough, and bilateral feet and leg swelling for 1 week. Pt states she has never had SOB this bad before. Pt was given 2 NTG PTA with EMS. Pt states she is compliant with her HTN medications. Pt denies use of O2 at home. Pt denies hx of asthma, COPD or CHF. Pt denies tobacco use. Pt denies fever, chills or any other complaints at this time. States she has had a dry cough.  With EMS, patient was found to have an oxygen level of 73% on room air. Her blood pressures when the 240/120s. She was placed on C Pap as they heard diffuse rales.  ROS: LEVEL 5 CAVEAT DUE TO Respiratory distress   PAST MEDICAL HISTORY/PAST SURGICAL HISTORY:  Past Medical History:  Diagnosis Date  . Allergy   . Anemia   . Anemia in CKD (chronic kidney disease)   . Depression   . Hyperlipidemia   . Hypertension     MEDICATIONS:  Prior to Admission medications   Medication Sig Start Date End Date Taking? Authorizing Provider  amLODipine (NORVASC) 10 MG tablet Take 10 mg by mouth daily.    [provider]  ferrous sulfate (FER-IN-SOL) 75 (15 FE) MG/ML SOLN Take by mouth.    [provider]  glipiZIDE (GLUCOTROL) 10 MG tablet Take 10 mg by mouth 2 (two) times daily before a meal.    [provider]  indapamide (LOZOL) 1.25 MG tablet Take 1.25 mg by mouth daily.    [provider]  insulin detemir (LEVEMIR) 100 UNIT/ML injection Inject 10 Units into the skin at bedtime.    [provider]  metFORMIN (GLUCOPHAGE) 1000 MG tablet Take 1,000 mg by mouth 2 (two) times daily with a meal.    [provider]  metoprolol (LOPRESSOR) 50 MG tablet Take 50 mg by mouth 2 (two) times daily.    [provider]  pravastatin (PRAVACHOL) 40 MG tablet Take 40 mg by mouth daily.    [provider]    ALLERGIES:  Not on File  SOCIAL HISTORY:  Social History  Substance Use Topics  . Smoking status: Not on file  . Smokeless tobacco: Not on file  . Alcohol use Not on file    FAMILY HISTORY: No family history on file.  EXAM: BP (!) 253/143   Pulse 91   Temp 98.7 F (37.1 C) (Oral)   Resp (!) 25   Ht 5\' 5"  (1.651 m)   Wt 102 kg (224 lb 12.8 oz)   SpO2 98%   BMI 37.41 kg/m  CONSTITUTIONAL: Alert and oriented and responds appropriately to questions. Obese, in severe respiratory distress HEAD: Normocephalic EYES: Conjunctivae clear, pupils appear equal, EOMI ENT: normal nose; moist mucous membranes NECK: Supple, no meningismus, no nuchal rigidity, no LAD; I do not appreciate any JVD CARD: RRR; S1 and S2 appreciated; no murmurs, no clicks, no rubs, no gallops RESP: Patient in severe respiratory distress, only able to answer one-word at a time or nod/shake her head  to questioning, she is tachypneic, hypoxic on room air, currently on C Pap, diffuse rales and rhonchi, no wheezing ABD/GI: Normal bowel sounds; non-distended; soft, non-tender, no rebound, no guarding, no peritoneal signs, no hepatosplenomegaly BACK:  The back appears normal and is non-tender to palpation, there is no CVA tenderness EXT: Normal ROM in all joints; non-tender to palpation; no edema; normal capillary refill; no cyanosis, no calf tenderness or swelling    SKIN: Normal color for age and race; warm; no rash NEURO: Moves all extremities equally PSYCH: The patient's mood and manner are appropriate. Grooming and personal hygiene are appropriate.  MEDICAL DECISION MAKING:  Patient here with hypertensive emergency causing CHF exacerbation. Last echocardiogram was in 2009. Will obtain labs, chest x-ray. We'll give IV Lasix, nitroglycerin drip. Will continue BiPAP. She states at this time she is not sure if she would want intubation if needed. She will need admission.  ED PROGRESS: Patient's labs show creatinine of 5.16. She has a metabolic acidosis likely from uremia. Lactate mildly elevated likely in the setting of respiratory failure. She is unsure what her creatinine normally runs but states that she is followed by Dr. Lorrene Reid and they have discussed the possibility of dialysis in the future. She does not currently have a dialysis catheter, fistula. Her blood gas shows a PCO2 of 36.4, PO2 of 195, pH of 7.29 and a bicarbonate of 17.5 again suggestive of metabolic acidosis. Her chest x-ray shows vascular congestion and borderline cardiomegaly. There is bilateral airspace opacities reflecting pulmonary edema. Patient's blood pressure slowly improving with nitroglycerin. Will continue to increase the rate as appropriate and give a dose of IV hydralazine.   Patient seems to be improving significantly. Her rales have improved as well as her tachypnea and she is now able to speak full sentences on BiPAP.  Blood pressure is now 180/90's. She is diuresing. We'll discuss with hospitalist for admission to step down.   3:23 AM Discussed patient's case with hospitalist, Dr. Loleta Books.  I have recommended admission and patient (and family if present) agree with this plan. Admitting physician will place admission orders.   I reviewed all nursing notes, vitals, pertinent previous records, EKGs, lab and urine results, imaging (as available).    EKG Interpretation  Date/Time:  Friday December 09 2016 01:19:41 EDT Ventricular Rate:  101 PR Interval:    QRS Duration: 90 QT Interval:  369 QTC Calculation: 479 R Axis:   -15 Text Interpretation:  Sinus tachycardia Borderline left axis  deviation Borderline repolarization abnormality Confirmed by Pryor Curia [phone]) on 12/09/2016 1:24:08 AM        CRITICAL CARE Performed by: Nyra Jabs   Total critical care time: 55 minutes  Critical care time was exclusive of separately billable procedures and treating other patients.  Critical care was necessary to treat or prevent imminent or life-threatening deterioration.  Critical care was time spent personally by me on the following activities: development of treatment plan with patient and/or surrogate as well as nursing, discussions with consultants, evaluation of patient's response to treatment, examination of patient, obtaining history from patient or surrogate, ordering and performing treatments and interventions, ordering and review of laboratory studies, ordering and review of radiographic studies, pulse oximetry and re-evaluation of patient's condition.  I personally performed the services described in this documentation, which was scribed in my presence. The recorded information has been reviewed and is accurate.      Laverna Dossett, Delice Bison, DO 12/09/16 [phone]

## 2016-12-09 NOTE — Care Management Note (Addendum)
Case Management Note  Patient Details  Name: Phyllis Hernandez MRN: [id number] Date of Birth: October 03, 1950  Subjective/Objective: Pt presented for Dyspnea-Acute on Chronic Renal Failure/ CHF. Pt initiated on IV Lasix. Pt is from home alone. Pt has Assigned PCP at [facility]. Pt is able to get medications without any problems.                   Action/Plan: CM did provide pt information in regards to Life Alert. CM did discuss the need for a scale to weigh daily. Pt is without a scale. Unable to get a scale from the clinic at this time. Pt may benefit from [facility] once stable. CM did make a referral to [facility] [place] resources. CM will continue to monitor.   Expected Discharge Date:                  Expected Discharge Plan:  [place]  In-House Referral:  Clinical Social Worker [employer] logistics/support/administrative officer)  Discharge planning Services  CM Consult  Post Acute Care Choice:   Home Health Choice offered to:   Patient  DME Arranged:   N/A DME Agency:   N/A  HH Arranged:    RN [place] Agency:   Alvis Lemmings  Status of Service:  Completed, signed off If discussed at [place] of Stay Meetings, dates discussed:  12-15-16, 12-20-16, 12-22-16, 12-27-16  Additional Comments: 12-28-16 [place], [place] [phone] Plan for d/c home today. Pt will be restarted on Insulin Pens- Preferred with Medicaid List. Pt states she has used the Pens in the past. Patient states she may need HH RN to come out to make visits for Medication management initially. CM did provide pt with an agency list-pt chose [facility]. Referral made to Liaison [facility] and [facility] to begin 12-29-16. CSW was consulted for transportation needs, however pt has a ride home now.    12-28-16 [address] [place] [phone] Staff RN noted that pt needs PCP- Pt has a Medicaid Assigned PCP via the [facility].    1512 12-27-16 Jacqlyn Krauss, RN,BSN  [phone] Per Adela Lank in HD:  Accepted at [facility] 1st treatment Friday July 13,2018 at 09:55 am. Tentative schedule is  Monday,Wednesday,Friday- Chairtime at 10:15am 2nd shift    1354 12-27-16 Jacqlyn Krauss, [place] [phone] 3rd HD treatment 12-27-16. Awaiting Clip Process. CM will continue to monitor for additional needs.    1553 12-23-16 Jacqlyn Krauss, RN,BSN [phone] Pt is now agreeable to start HD treatments. Pt will need CLIP process and access to be placed 12-23-16. CM will continue to monitor for additional needs.     1656 12-20-16 Jacqlyn Krauss, RN,BSN [phone] Cr increased- Nephrology following. Question Vein Mapping. Pt may benefit from [facility] RN for medication management.  CM will continue to monitor.    [address], [place] Plan for Renal Artery Stent. CM will continue to monitor for additional needs. Bethena Roys, RN 12/09/2016, 3:43 PM

## 2016-12-09 NOTE — Progress Notes (Signed)
Jonette Eva paged re: lactic acid 3.4, returned page, acknowledged level, and reviewed BP

## 2016-12-09 NOTE — Progress Notes (Signed)
   12/09/16 1000  Clinical Encounter Type  Visited With Patient  Visit Type Other (Comment) ([place] consult)  Spiritual Encounters  Spiritual Needs Emotional  Stress Factors  Patient Stress Factors None identified  Introduction to Pt. Explained AD. Pt reported she did not request documentation. No other needs.

## 2016-12-10 ENCOUNTER — Inpatient Hospital Stay (HOSPITAL_COMMUNITY): Payer: Medicaid Other

## 2016-12-10 DIAGNOSIS — I509 Heart failure, unspecified: Secondary | ICD-10-CM

## 2016-12-10 LAB — GLUCOSE, CAPILLARY
GLUCOSE-CAPILLARY: 224 mg/dL — AB (ref 65–99)
GLUCOSE-CAPILLARY: 203 mg/dL — AB (ref 65–99)
Glucose-Capillary: 261 mg/dL — ABNORMAL HIGH (ref 65–99)
Glucose-Capillary: 194 mg/dL — ABNORMAL HIGH (ref 65–99)

## 2016-12-10 LAB — BASIC METABOLIC PANEL
Anion gap: 11 (ref 5–15)
BUN: 59 mg/dL — AB (ref 6–20)
CALCIUM: 9.2 mg/dL (ref 8.9–10.3)
CHLORIDE: 105 mmol/L (ref 101–111)
CO2: 17 mmol/L — AB (ref 22–32)
CREATININE: 5.28 mg/dL — AB (ref 0.44–1.00)
GFR calc Af Amer: 9 mL/min — ABNORMAL LOW (ref >60)
GFR calc non Af Amer: 8 mL/min — ABNORMAL LOW (ref >60)
Glucose, Bld: 246 mg/dL — ABNORMAL HIGH (ref 65–99)
Potassium: 5.1 mmol/L (ref 3.5–5.1)
Sodium: 133 mmol/L — ABNORMAL LOW (ref 135–145)

## 2016-12-10 LAB — ECHOCARDIOGRAM COMPLETE
Height: 65 in
Weight: 3547.2 oz

## 2016-12-10 LAB — CBC WITH DIFFERENTIAL/PLATELET
Basophils Absolute: 0 10*3/uL (ref 0.0–0.1)
Basophils Relative: 0 %
Eosinophils Absolute: 0 10*3/uL (ref 0.0–0.7)
Eosinophils Relative: 0 %
HEMATOCRIT: 30.1 % — AB (ref 36.0–46.0)
HEMOGLOBIN: 9.4 g/dL — AB (ref 12.0–15.0)
LYMPHS ABS: 1.2 10*3/uL (ref 0.7–4.0)
LYMPHS PCT: 12 %
MCH: 22 pg — AB (ref 26.0–34.0)
MCHC: 31.2 g/dL (ref 30.0–36.0)
MCV: 70.3 fL — AB (ref 78.0–100.0)
Monocytes Absolute: 1.1 10*3/uL — ABNORMAL HIGH (ref 0.1–1.0)
Monocytes Relative: 11 %
NEUTROS ABS: 7.6 10*3/uL (ref 1.7–7.7)
NEUTROS PCT: 77 %
Platelets: 140 10*3/uL — ABNORMAL LOW (ref 150–400)
RBC: 4.28 MIL/uL (ref 3.87–5.11)
RDW: 15.6 % — ABNORMAL HIGH (ref 11.5–15.5)
WBC: 9.9 10*3/uL (ref 4.0–10.5)

## 2016-12-10 LAB — MAGNESIUM: Magnesium: 1.5 mg/dL — ABNORMAL LOW (ref 1.7–2.4)

## 2016-12-10 LAB — HIV ANTIBODY (ROUTINE TESTING W REFLEX): HIV Screen 4th Generation wRfx: NONREACTIVE

## 2016-12-10 MED ORDER — MAGNESIUM SULFATE 2 GM/50ML IV SOLN
2.0000 g | Freq: Once | INTRAVENOUS | Status: AC
  Administered 2016-12-10: 2 g via INTRAVENOUS
  Filled 2016-12-10: qty 50

## 2016-12-10 MED ORDER — ISOSORB DINITRATE-HYDRALAZINE 20-37.5 MG PO TABS
1.0000 | ORAL_TABLET | Freq: Three times a day (TID) | ORAL | Status: DC
  Administered 2016-12-11 – 2016-12-18 (×22): 1 via ORAL
  Filled 2016-12-10 (×23): qty 1

## 2016-12-10 MED ORDER — CARVEDILOL 6.25 MG PO TABS
6.2500 mg | ORAL_TABLET | Freq: Two times a day (BID) | ORAL | Status: DC
  Administered 2016-12-10 – 2016-12-11 (×2): 6.25 mg via ORAL
  Filled 2016-12-10 (×2): qty 1

## 2016-12-10 MED ORDER — FUROSEMIDE 80 MG PO TABS
80.0000 mg | ORAL_TABLET | Freq: Two times a day (BID) | ORAL | Status: DC
  Administered 2016-12-11: 80 mg via ORAL
  Filled 2016-12-10: qty 1

## 2016-12-10 MED ORDER — INSULIN GLARGINE 100 UNIT/ML ~~LOC~~ SOLN
10.0000 [IU] | Freq: Every day | SUBCUTANEOUS | Status: DC
  Administered 2016-12-10: 10 [IU] via SUBCUTANEOUS
  Filled 2016-12-10: qty 0.1

## 2016-12-10 MED ORDER — CARVEDILOL 3.125 MG PO TABS: 3.1250 mg | ORAL_TABLET | Freq: Two times a day (BID) | ORAL | Status: DC

## 2016-12-10 NOTE — Progress Notes (Signed)
  Echocardiogram 2D Echocardiogram has been performed.  Phyllis Hernandez 12/10/2016, 2:35 PM

## 2016-12-10 NOTE — Progress Notes (Signed)
[facility] Progress Note    Assessment/ Plan:   1.  AKI on CKD: likely due to [facility] emergency.  Fortunately is making urine.  Expect some improvement with control of BP.  No uremic symptoms, no indication for dialysis at present.  Creatinine is stable.  Diuresing well; will switch to oral Lasix for tomorrow.    2.  HTNsive emergency: pt reports taking meds; occurring in the setting of acute diastolic CHF exac.  Home meds include indapamide, amlodipine, metop.  Have switched to coreg from metop.  Would not restart indapamide at present; Lasix to supplant.  Renal function prohibitive of RAAS blockade.     3.  DM II: per primary  4.  Troponemia: likely demand.  TTE performed 6/23, read pending.  Subjective:    No complaints today.  Remains on nitro gtt, started coreg.  Weight is down.     Objective:   BP (!) 146/91 (BP Location: Right Arm)   Pulse (!) 104   Temp 98.6 F (37 C) (Oral)   Resp (!) 21   Ht 5\' 5"  (1.651 m)   Wt 100.6 kg (221 lb 11.2 oz)   SpO2 98%   BMI 36.89 kg/m   Intake/Output Summary (Last 24 hours) at 12/10/16 1411 Last data filed at 12/10/16 1200  Gross per 24 hour  Intake           690.95 ml  Output              900 ml  Net          -209.05 ml   Weight change: -1.406 kg (-3 lb 1.6 oz)  Physical Exam: GEN NAD, lying in bed HEENT sclerae anicteric NECK no JVD PULM clear bilaterally CV RRR ABD soft EXT 1+ LE edema NEURO nonfocal SKIN no rashes, some dry skin on legs  Imaging: Dg Chest Portable 1 View  Result Date: 12/09/2016 CLINICAL DATA:  Acute onset of respiratory distress. Initial encounter. EXAM: PORTABLE CHEST 1 VIEW COMPARISON:  Chest radiograph performed 04/17/2008 FINDINGS: The lungs are well-aerated. Vascular congestion is noted. Bilateral airspace opacification likely reflects pulmonary edema. No pleural effusion or pneumothorax is seen. The cardiomediastinal silhouette is borderline enlarged. No acute osseous  abnormalities are seen. A loose body is noted at the inferior right glenoid. IMPRESSION: Vascular congestion and borderline cardiomegaly. Bilateral airspace opacification likely reflects pulmonary edema. Electronically Signed   By: Garald Balding M.D.   On: 12/09/2016 01:34    Labs: BMET  Recent Labs Lab 12/09/16 0148 12/10/16 0322  NA 137  140 133*  K 4.9  4.8 5.1  CL 107  109 105  CO2 18* 17*  GLUCOSE 219*  219* 246*  BUN 57*  55* 59*  CREATININE 5.16*  5.30* 5.28*  CALCIUM 9.3 9.2   CBC  Recent Labs Lab 12/09/16 0148 12/09/16 0424 12/10/16 0322  WBC  --  10.4 9.9  NEUTROABS  --  8.5* 7.6  HGB 12.2 10.9* 9.4*  HCT 36.0 34.4* 30.1*  MCV  --  71.2* 70.3*  PLT  --  148* 140*    Medications:    . aspirin  81 mg Oral Daily  . carvedilol  6.25 mg Oral BID WC  . enoxaparin (LOVENOX) injection  30 mg Subcutaneous Daily  . furosemide  80 mg Intravenous Daily  . insulin aspart  0-15 Units Subcutaneous TID WC  . insulin aspart  0-5 Units Subcutaneous QHS  . insulin glargine  10 Units Subcutaneous QHS  . [  START ON 12/11/2016] isosorbide-hydrALAZINE  1 tablet Oral TID  . mouth rinse  15 mL Mouth Rinse BID  . pravastatin  40 mg Oral Daily  . sodium chloride flush  3 mL Intravenous Q12H      Madelon Lips, MD [facility] pgr [phone] 12/10/2016, 2:11 PM

## 2016-12-10 NOTE — Progress Notes (Signed)
[place] TEAM 1 - Stepdown/ICU TEAM  LORINDA COPLAND  QIH:[id number] DOB: 1951-04-02 DOA: 12/09/2016 PCP: Everardo Beals, NP    Brief Narrative:  66 y.o.F Hx DM, HTN, and CKD IV who presented with acute dyspnea.  started to feel SOB, sweaty and nervous. She summoned EMS, who found her SpO2 to be 70% and BP 250/140.  Subjective: Patient is resting comfortably in bed.  She denies shortness of breath and states her breathing is improved significant.  She denies chest pain or abdominal pain.  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 Baseline crt 2.67 as of Aug 2017 - nephrology following - diurese - follow renal function  Recent Labs Lab 12/09/16 0148 12/10/16 0322  CREATININE 5.16*  5.30* 5.28*   DM2 CBG not at goal - adjust tx and follow - on oral tx only at home   Hypomagnesemia Replace and follow - goal 2.0  Elevated troponin  Most c/w demand ischemia in setting of HTN emergency - look for focal WMA on TTE   DVT prophylaxis: Lovenox Code Status: FULL CODE Family Communication: no family present at time of exam  Disposition Plan:   Consultants:  Nephrology   Procedures: None  Antimicrobials:  None  Objective: Blood pressure (!) 146/91, pulse (!) 104, temperature 98.6 F (37 C), temperature source Oral, resp. rate (!) 21, height 5\' 5"  (1.651 m), weight 100.6 kg (221 lb 11.2 oz), SpO2 98 %.  Intake/Output Summary (Last 24 hours) at 12/10/16 1339 Last data filed at 12/10/16 1200  Gross per 24 hour  Intake           690.95 ml  Output              900 ml  Net          -209.05 ml   Filed Weights   12/09/16 0453 12/10/16 0500  Weight: 102 kg (224 lb 12.8 oz) 100.6 kg (221 lb 11.2 oz)    Examination: General:  No acute respiratory distress Lungs: Clear to auscultation bilaterally without wheezes or crackles Cardiovascular: Regular rate and rhythm without murmur gallop or rub normal S1 and S2 Abdomen: Nontender, nondistended, soft, bowel sounds positive, no rebound, no ascites, no appreciable mass Extremities: 1+ edema bilateral lower extremities  CBC:  Recent Labs Lab 12/09/16 0148 12/09/16 0424 12/10/16 0322  WBC  --  10.4 9.9  NEUTROABS  --  8.5* 7.6  HGB 12.2 10.9* 9.4*  HCT 36.0 34.4* 30.1*  MCV  --  71.2* 70.3*  PLT  --  148* 875*   Basic Metabolic Panel:  Recent Labs Lab 12/09/16 0148 12/10/16 0322  NA 137  140 133*  K 4.9  4.8 5.1  CL 107  109 105  CO2 18* 17*  GLUCOSE 219*  219* 246*  BUN 57*  55* 59*  CREATININE 5.16*  5.30* 5.28*  CALCIUM 9.3 9.2  MG  --  1.5*   GFR: Estimated Creatinine Clearance: 12.8 mL/min (A) (by C-G formula based on SCr of 5.28 mg/dL (H)).  Liver Function Tests:  Recent Labs Lab 12/09/16 0148  AST 24  ALT 21  ALKPHOS 73  BILITOT 0.6  PROT 8.0  ALBUMIN 3.6   Cardiac Enzymes:  Recent Labs Lab 12/09/16 1029 12/09/16 1800  TROPONINI  0.13* 0.13*    CBG:  Recent Labs Lab 12/09/16 1107 12/09/16 1619 12/09/16 2201 12/10/16 0759 12/10/16 1118  GLUCAP 198* 170* 185* 261* 194*    Recent Results (from the past 240 hour(s))  MRSA PCR Screening     Status: None   Collection Time: 12/09/16  4:55 AM  Result Value Ref Range Status   MRSA by PCR NEGATIVE NEGATIVE Final    Comment:        The GeneXpert MRSA Assay (FDA approved for NASAL specimens only), is one component of a comprehensive MRSA colonization surveillance program. It is not intended to diagnose MRSA infection nor to guide or monitor treatment for MRSA infections.      Scheduled Meds: . aspirin  81 mg Oral Daily  . enoxaparin (LOVENOX) injection  30 mg Subcutaneous Daily  . furosemide  80 mg Intravenous Daily  . insulin aspart  0-15 Units  Subcutaneous TID WC  . insulin aspart  0-5 Units Subcutaneous QHS  . mouth rinse  15 mL Mouth Rinse BID  . pravastatin  40 mg Oral Daily  . sodium chloride flush  3 mL Intravenous Q12H   Continuous Infusions: . nitroGLYCERIN 10 mcg/min (12/10/16 1113)     LOS: 1 day   Cherene Altes, MD [facility] Office  [phone] Pager - Text Page per Amion as per below:  On-Call/Text Page:      [email or web address]      password [email or web address]  If 7PM-7AM, please contact night-coverage [email or web address] Password [email or web address] 12/10/2016, 1:39 PM

## 2016-12-11 LAB — RENAL FUNCTION PANEL
Albumin: 3 g/dL — ABNORMAL LOW (ref 3.5–5.0)
Anion gap: 10 (ref 5–15)
BUN: 58 mg/dL — ABNORMAL HIGH (ref 6–20)
CHLORIDE: 104 mmol/L (ref 101–111)
CO2: 18 mmol/L — ABNORMAL LOW (ref 22–32)
Calcium: 9 mg/dL (ref 8.9–10.3)
Creatinine, Ser: 5.83 mg/dL — ABNORMAL HIGH (ref 0.44–1.00)
GFR, EST AFRICAN AMERICAN: 8 mL/min — AB (ref >60)
GFR, EST NON AFRICAN AMERICAN: 7 mL/min — AB (ref >60)
Glucose, Bld: 166 mg/dL — ABNORMAL HIGH (ref 65–99)
POTASSIUM: 4.8 mmol/L (ref 3.5–5.1)
Phosphorus: 4.3 mg/dL (ref 2.5–4.6)
Sodium: 132 mmol/L — ABNORMAL LOW (ref 135–145)

## 2016-12-11 LAB — GLUCOSE, CAPILLARY
GLUCOSE-CAPILLARY: 178 mg/dL — AB (ref 65–99)
GLUCOSE-CAPILLARY: 176 mg/dL — AB (ref 65–99)
Glucose-Capillary: 163 mg/dL — ABNORMAL HIGH (ref 65–99)
Glucose-Capillary: 224 mg/dL — ABNORMAL HIGH (ref 65–99)

## 2016-12-11 LAB — CBC
HEMATOCRIT: 29.9 % — AB (ref 36.0–46.0)
Hemoglobin: 9.4 g/dL — ABNORMAL LOW (ref 12.0–15.0)
MCH: 22 pg — ABNORMAL LOW (ref 26.0–34.0)
MCHC: 31.4 g/dL (ref 30.0–36.0)
MCV: 69.9 fL — AB (ref 78.0–100.0)
Platelets: 123 10*3/uL — ABNORMAL LOW (ref 150–400)
RBC: 4.28 MIL/uL (ref 3.87–5.11)
RDW: 15.3 % (ref 11.5–15.5)
WBC: 9.5 10*3/uL (ref 4.0–10.5)

## 2016-12-11 LAB — HEMOGLOBIN A1C
Hgb A1c MFr Bld: 7.3 % — ABNORMAL HIGH (ref 4.8–5.6)
Mean Plasma Glucose: 163 mg/dL

## 2016-12-11 LAB — MAGNESIUM: Magnesium: 2.1 mg/dL (ref 1.7–2.4)

## 2016-12-11 MED ORDER — CARVEDILOL 3.125 MG PO TABS
3.1250 mg | ORAL_TABLET | Freq: Two times a day (BID) | ORAL | Status: DC
  Administered 2016-12-11 – 2016-12-12 (×3): 3.125 mg via ORAL
  Filled 2016-12-11 (×3): qty 1

## 2016-12-11 MED ORDER — INSULIN GLARGINE 100 UNIT/ML ~~LOC~~ SOLN
18.0000 [IU] | Freq: Every day | SUBCUTANEOUS | Status: DC
  Administered 2016-12-11 – 2016-12-13 (×3): 18 [IU] via SUBCUTANEOUS
  Filled 2016-12-11 (×4): qty 0.18

## 2016-12-11 MED ORDER — HEPARIN SODIUM (PORCINE) 5000 UNIT/ML IJ SOLN: 5000.0000 [IU] | Freq: Three times a day (TID) | INTRAMUSCULAR | Status: DC

## 2016-12-11 MED ORDER — HEPARIN SODIUM (PORCINE) 5000 UNIT/ML IJ SOLN
5000.0000 [IU] | Freq: Three times a day (TID) | INTRAMUSCULAR | Status: DC
  Administered 2016-12-12 – 2016-12-15 (×10): 5000 [IU] via SUBCUTANEOUS
  Filled 2016-12-11 (×11): qty 1

## 2016-12-11 MED ORDER — INSULIN GLARGINE 100 UNIT/ML ~~LOC~~ SOLN
14.0000 [IU] | Freq: Every day | SUBCUTANEOUS | Status: DC
  Filled 2016-12-11: qty 0.14

## 2016-12-11 MED ORDER — FUROSEMIDE 40 MG PO TABS: 40.0000 mg | ORAL_TABLET | Freq: Every day | ORAL | Status: DC

## 2016-12-11 NOTE — Progress Notes (Signed)
[facility] Progress Note    Assessment/ Plan:   1.  AKI on CKD: likely due to [facility] emergency.  Fortunately is making urine.  Expect some improvement with control of BP.  No uremic symptoms, no indication for dialysis at present.  Cr with a little bump today; Weights are down, diuresing well, will hold Lasix dosing for tomorrow and will add back if appropriate --> would do Lasix 40 mg daily especially with the addition of new BP meds.   2.  HTNsive emergency: pt reports taking meds; occurring in the setting of acute diastolic CHF exac.  Home meds include indapamide, amlodipine, metop.  Have switched to coreg from metop; bidil added today 6/24.  Would not restart indapamide at present; Lasix to supplant.  Renal function prohibitive of RAAS blockade.     3.  DM II: per primary  4.  Troponemia: likely demand.  TTE performed 6/23, EF 60-65% without WMA and mild LVH.  Subjective:    No complaints.  Off nitro gtt.      Objective:   BP (!) 109/50   Pulse 84   Temp 98.5 F (36.9 C) (Oral)   Resp (!) 24   Ht 5\' 5"  (1.651 m)   Wt 99.8 kg (220 lb)   SpO2 95%   BMI 36.61 kg/m   Intake/Output Summary (Last 24 hours) at 12/11/16 0918 Last data filed at 12/11/16 0047  Gross per 24 hour  Intake           1171.4 ml  Output             2500 ml  Net          -1328.6 ml   Weight change: -0.771 kg (-1 lb 11.2 oz)  Physical Exam: GEN NAD, lying in bed HEENT sclerae anicteric NECK no JVD PULM clear bilaterally CV RRR ABD soft EXT 1+ LE edema NEURO nonfocal SKIN no rashes, some dry skin on legs  Imaging: No results found.  Labs: BMET  Recent Labs Lab 12/09/16 0148 12/10/16 0322 12/11/16 0424  NA 137  140 133* 132*  K 4.9  4.8 5.1 4.8  CL 107  109 105 104  CO2 18* 17* 18*  GLUCOSE 219*  219* 246* 166*  BUN 57*  55* 59* 58*  CREATININE 5.16*  5.30* 5.28* 5.83*  CALCIUM 9.3 9.2 9.0  PHOS  --   --  4.3   CBC  Recent Labs Lab 12/09/16 0148  12/09/16 0424 12/10/16 0322 12/11/16 0424  WBC  --  10.4 9.9 9.5  NEUTROABS  --  8.5* 7.6  --   HGB 12.2 10.9* 9.4* 9.4*  HCT 36.0 34.4* 30.1* 29.9*  MCV  --  71.2* 70.3* 69.9*  PLT  --  148* 140* 123*    Medications:    . aspirin  81 mg Oral Daily  . carvedilol  6.25 mg Oral BID WC  . [START ON 12/12/2016] furosemide  40 mg Oral Daily  . heparin subcutaneous  5,000 Units Subcutaneous Q8H  . insulin aspart  0-15 Units Subcutaneous TID WC  . insulin aspart  0-5 Units Subcutaneous QHS  . insulin glargine  10 Units Subcutaneous QHS  . isosorbide-hydrALAZINE  1 tablet Oral TID  . mouth rinse  15 mL Mouth Rinse BID  . pravastatin  40 mg Oral Daily  . sodium chloride flush  3 mL Intravenous Q12H      Madelon Lips, MD [facility], [place] pgr [phone] 12/11/2016, 9:18 AM

## 2016-12-11 NOTE — Progress Notes (Signed)
[place] TEAM 1 - Stepdown/ICU TEAM  Phyllis Hernandez  NAT:[id number] DOB: [birth year] DOA: 12/09/2016 PCP: Everardo Beals, NP    Brief Narrative:  66 y.o.F Hx DM, HTN, and CKD IV who presented with acute dyspnea.  started to feel SOB, sweaty and nervous. She summoned EMS, who found her SpO2 to be 70% and BP 250/140.  Subjective: The patient is resting comfortably in bed.  She feels somewhat better in general.  She denies significant shortness of breath at this time.  She denies chest pain nausea or vomiting.  Assessment & Plan:  Hypertensive emergency with acute diastolic CHF exacerbation BP improved - transitioned to oral medications - attempt to avoid Norvasc with patient's complaints of lower extremity edema - no ACEi/ARB due to kidney disease - avoid overaggressive correction   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 Baseline crt 2.67 as of Aug 2017 - Nephrology following - diurese - follow renal function - hoping for recovery - no indication for HD at this time   Recent Labs Lab 12/09/16 0148 12/10/16 0322 12/11/16 0424  CREATININE 5.16*  5.30* 5.28* 5.83*   DM2 CBG remains variable - adjust tx again and follow   Hypomagnesemia Corrected and now at goal   Elevated troponin  Most c/w demand ischemia in setting of HTN emergency - no WMA on TTE - no indication for further inpt w/u   DVT prophylaxis: Lovenox Code Status: FULL CODE Family Communication: no family present at time of exam  Disposition Plan: transfer to tele - follow BP - ambulate - follow renal fxn   Consultants:  Nephrology   Procedures: None  Antimicrobials:  None  Objective: Blood pressure (!) 115/52, pulse 81, temperature 98.5 F (36.9 C), temperature source Oral, resp. rate (!) 24, height 5\' 5"  (1.651 m), weight 99.8 kg (220 lb), SpO2 100 %.  Intake/Output Summary (Last 24 hours) at 12/11/16 1327 Last data filed at 12/11/16 0900  Gross per 24 hour  Intake           1200.45 ml  Output             2750 ml  Net         -1549.55 ml   Filed Weights   12/09/16 0453 12/10/16 0500 12/11/16 0546  Weight: 102 kg (224 lb 12.8 oz) 100.6 kg (221 lb 11.2 oz) 99.8 kg (220 lb)    Examination: General: No acute respiratory distress - alert  Lungs: CTA B w/o wheeze  Cardiovascular: RRR w/o M or rub  Abdomen: Nontender, nondistended, soft, bowel sounds positive, no rebound, no ascites, no appreciable mass Extremities: 1+ edema bilateral lower extremities persists   CBC:  Recent Labs Lab 12/09/16 0148 12/09/16 0424 12/10/16 0322 12/11/16 0424  WBC  --  10.4 9.9 9.5  NEUTROABS  --  8.5* 7.6  --   HGB 12.2 10.9* 9.4* 9.4*  HCT 36.0 34.4* 30.1* 29.9*  MCV  --  71.2* 70.3* 69.9*  PLT  --  148* 140* 427*   Basic Metabolic Panel:  Recent Labs Lab 12/09/16 0148 12/10/16 0322 12/11/16 0424  NA 137  140 133* 132*  K 4.9  4.8 5.1 4.8  CL 107  109 105 104  CO2 18* 17* 18*  GLUCOSE 219*  219* 246* 166*  BUN 57*  55* 59* 58*  CREATININE 5.16*  5.30* 5.28* 5.83*  CALCIUM 9.3 9.2 9.0  MG  --  1.5* 2.1  PHOS  --   --  4.3   GFR: Estimated Creatinine Clearance: 11.6 mL/min (A) (by C-G formula based on SCr of 5.83 mg/dL (H)).  Liver Function Tests:  Recent Labs Lab 12/09/16 0148 12/11/16 0424  AST 24  --   ALT 21  --   ALKPHOS 73  --   BILITOT 0.6  --   PROT 8.0  --   ALBUMIN 3.6 3.0*   Cardiac Enzymes:  Recent Labs Lab 12/09/16 1029 12/09/16 1800  TROPONINI 0.13* 0.13*    CBG:  Recent Labs Lab 12/10/16 1118 12/10/16 1652 12/10/16 2047 12/11/16 0749 12/11/16 1122  GLUCAP 194* 224* 203* 163* 224*    Recent Results (from the past 240 hour(s))  MRSA PCR Screening     Status: None   Collection Time: 12/09/16  4:55 AM  Result Value Ref Range Status   MRSA by PCR NEGATIVE NEGATIVE Final    Comment:        The GeneXpert MRSA Assay (FDA approved for NASAL specimens only), is one component of a comprehensive MRSA  colonization surveillance program. It is not intended to diagnose MRSA infection nor to guide or monitor treatment for MRSA infections.      Scheduled Meds: . aspirin  81 mg Oral Daily  . carvedilol  6.25 mg Oral BID WC  . [START ON 12/12/2016] heparin subcutaneous  5,000 Units Subcutaneous Q8H  . insulin aspart  0-15 Units Subcutaneous TID WC  . insulin aspart  0-5 Units Subcutaneous QHS  . insulin glargine  10 Units Subcutaneous QHS  . isosorbide-hydrALAZINE  1 tablet Oral TID  . mouth rinse  15 mL Mouth Rinse BID  . pravastatin  40 mg Oral Daily  . sodium chloride flush  3 mL Intravenous Q12H     LOS: 2 days   Cherene Altes, MD [facility] Office  [phone] Pager - Text Page per Amion as per below:  On-Call/Text Page:      [email or web address]      password [email or web address]  If 7PM-7AM, please contact night-coverage [email or web address] Password [email or web address] 12/11/2016, 1:27 PM

## 2016-12-12 LAB — RENAL FUNCTION PANEL
Albumin: 2.6 g/dL — ABNORMAL LOW (ref 3.5–5.0)
Anion gap: 12 (ref 5–15)
BUN: 67 mg/dL — ABNORMAL HIGH (ref 6–20)
CALCIUM: 8.3 mg/dL — AB (ref 8.9–10.3)
CHLORIDE: 103 mmol/L (ref 101–111)
CO2: 17 mmol/L — AB (ref 22–32)
CREATININE: 6.51 mg/dL — AB (ref 0.44–1.00)
GFR calc non Af Amer: 6 mL/min — ABNORMAL LOW (ref >60)
GFR, EST AFRICAN AMERICAN: 7 mL/min — AB (ref >60)
GLUCOSE: 148 mg/dL — AB (ref 65–99)
Phosphorus: 5.5 mg/dL — ABNORMAL HIGH (ref 2.5–4.6)
Potassium: 4.4 mmol/L (ref 3.5–5.1)
SODIUM: 132 mmol/L — AB (ref 135–145)

## 2016-12-12 LAB — GLUCOSE, CAPILLARY
GLUCOSE-CAPILLARY: 187 mg/dL — AB (ref 65–99)
GLUCOSE-CAPILLARY: 232 mg/dL — AB (ref 65–99)
Glucose-Capillary: 151 mg/dL — ABNORMAL HIGH (ref 65–99)
Glucose-Capillary: 229 mg/dL — ABNORMAL HIGH (ref 65–99)

## 2016-12-12 NOTE — Progress Notes (Signed)
[place] TEAM 1 - Stepdown/ICU TEAM  Phyllis Hernandez  EQA:[id number] DOB: Dec 23, 1950 DOA: 12/09/2016 PCP: Everardo Beals, NP    Brief Narrative:  66 y.o.F Hx DM, HTN, and CKD IV who presented with acute dyspnea.  started to feel SOB, sweaty and nervous. She summoned EMS, who found her SpO2 to be 70% and BP 250/140.  Subjective: The patient denies shortness of breath or chest pain at this time.  Assessment & Plan:  Hypertensive emergency with acute diastolic CHF exacerbation BP improved - transitioned to oral medications - attempt to avoid Norvasc with patient's complaints of lower extremity edema - no ACEi/ARB due to kidney disease - avoid over-aggressive correction   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 Baseline crt 2.67 as of Aug 2017 - Nephrology following - diurese - follow renal function - hoping for recovery but presently creatinine worsening - no indication for HD at this time   Recent Labs Lab 12/09/16 0148 12/10/16 0322 12/11/16 0424 12/12/16 0440  CREATININE 5.16*  5.30* 5.28* 5.83* 6.51*   DM2 no change in tx plan today  Hypomagnesemia Corrected   Elevated troponin  Most c/w demand ischemia in setting of HTN emergency - no WMA on TTE - no indication for further inpt w/u   DVT prophylaxis: Lovenox Code Status: FULL CODE Family Communication: no family present at time of exam  Disposition Plan: follow renal fxn   Consultants:  Nephrology   Procedures: None  Antimicrobials:  None  Objective: Blood pressure 138/69, pulse 89, temperature 99.5 F (37.5 C), temperature source Oral, resp. rate 18, height 5\' 5"  (1.651 m), weight 99.8 kg (220 lb), SpO2 98 %. No intake or output data in the 24 hours ending 12/12/16 1620 Filed Weights   12/09/16 0453 12/10/16 0500 12/11/16 0546  Weight: 102 kg (224 lb 12.8 oz) 100.6 kg (221 lb 11.2 oz) 99.8 kg (220 lb)    Examination: General: No acute respiratory distress  Lungs: CTA B w/o  wheeze  Cardiovascular: RRR   CBC:  Recent Labs Lab 12/09/16 0148 12/09/16 0424 12/10/16 0322 12/11/16 0424  WBC  --  10.4 9.9 9.5  NEUTROABS  --  8.5* 7.6  --   HGB 12.2 10.9* 9.4* 9.4*  HCT 36.0 34.4* 30.1* 29.9*  MCV  --  71.2* 70.3* 69.9*  PLT  --  148* 140* 979*   Basic Metabolic Panel:  Recent Labs Lab 12/09/16 0148 12/10/16 0322 12/11/16 0424 12/12/16 0440  NA 137  140 133* 132* 132*  K 4.9  4.8 5.1 4.8 4.4  CL 107  109 105 104 103  CO2 18* 17* 18* 17*  GLUCOSE 219*  219* 246* 166* 148*  BUN 57*  55* 59* 58* 67*  CREATININE 5.16*  5.30* 5.28* 5.83* 6.51*  CALCIUM 9.3 9.2 9.0 8.3*  MG  --  1.5* 2.1  --   PHOS  --   --  4.3 5.5*   GFR: Estimated Creatinine Clearance: 10.3 mL/min (A) (by C-G formula based on SCr of 6.51 mg/dL (H)).  Liver Function Tests:  Recent Labs Lab 12/09/16 0148 12/11/16 0424 12/12/16 0440  AST 24  --   --   ALT 21  --   --   ALKPHOS 73  --   --   BILITOT 0.6  --   --   PROT 8.0  --   --   ALBUMIN 3.6 3.0* 2.6*   Cardiac Enzymes:  Recent Labs Lab 12/09/16 1029 12/09/16 1800  TROPONINI 0.13* 0.13*    CBG:  Recent Labs Lab 12/11/16 1122 12/11/16 1701 12/11/16 2130 12/12/16 0850 12/12/16 1252  GLUCAP 224* 178* 176* 187* 151*    Recent Results (from the past 240 hour(s))  MRSA PCR Screening     Status: None   Collection Time: 12/09/16  4:55 AM  Result Value Ref Range Status   MRSA by PCR NEGATIVE NEGATIVE Final    Comment:        The GeneXpert MRSA Assay (FDA approved for NASAL specimens only), is one component of a comprehensive MRSA colonization surveillance program. It is not intended to diagnose MRSA infection nor to guide or monitor treatment for MRSA infections.      Scheduled Meds: . aspirin  81 mg Oral Daily  . carvedilol  3.125 mg Oral BID WC  . heparin subcutaneous  5,000 Units Subcutaneous Q8H  . insulin aspart  0-15 Units Subcutaneous TID WC  . insulin aspart  0-5 Units  Subcutaneous QHS  . insulin glargine  18 Units Subcutaneous QHS  . isosorbide-hydrALAZINE  1 tablet Oral TID  . mouth rinse  15 mL Mouth Rinse BID  . pravastatin  40 mg Oral Daily  . sodium chloride flush  3 mL Intravenous Q12H     LOS: 3 days   Cherene Altes, MD [facility] Office  [phone] Pager - Text Page per Amion as per below:  On-Call/Text Page:      [email or web address]      password [email or web address]  If 7PM-7AM, please contact night-coverage [email or web address] Password [email or web address] 12/12/2016, 4:20 PM

## 2016-12-12 NOTE — Progress Notes (Signed)
  Assessment/ Plan:   1. AKI on CKD: likely due to [facility] emergency. Worse--- Fortunately is making urine. Will watch for plateau. 2. HTNsive emergency: with acute diastolic CHF exac.  3. DM II: per primary 4. Troponinemia: likely demand.  TTE performed 6/23, EF 60-65% without WMA and mild LVH.  Subjective: Interval History: Good UOP  Objective: Vital signs in last 24 hours: Temp:  [97.7 F (36.5 C)-99.5 F (37.5 C)] 99.5 F (37.5 C) (06/25 0538) Pulse Rate:  [73-95] 89 (06/25 1017) Resp:  [16-21] 18 (06/25 0538) BP: (91-160)/(50-90) 138/69 (06/25 1017) SpO2:  [97 %-100 %] 98 % (06/25 0538) Weight change:   Intake/Output from previous day: 06/24 0701 - 06/25 0700 In: 840 [P.O.:840] Out: 700 [Urine:700] Intake/Output this shift: No intake/output data recorded.  General appearance: alert and cooperative Resp: clear to auscultation bilaterally Chest wall: no tenderness Cardio: regular rate and rhythm, S1, S2 normal, no murmur, click, rub or gallop Extremities: edema tr  Lab Results:  Recent Labs  12/10/16 0322 12/11/16 0424  WBC 9.9 9.5  HGB 9.4* 9.4*  HCT 30.1* 29.9*  PLT 140* 123*   BMET:  Recent Labs  12/11/16 0424 12/12/16 0440  NA 132* 132*  K 4.8 4.4  CL 104 103  CO2 18* 17*  GLUCOSE 166* 148*  BUN 58* 67*  CREATININE 5.83* 6.51*  CALCIUM 9.0 8.3*   No results for input(s): PTH in the last 72 hours. Iron Studies: No results for input(s): IRON, TIBC, TRANSFERRIN, FERRITIN in the last 72 hours. Studies/Results: No results found.  Scheduled: . aspirin  81 mg Oral Daily  . carvedilol  3.125 mg Oral BID WC  . heparin subcutaneous  5,000 Units Subcutaneous Q8H  . insulin aspart  0-15 Units Subcutaneous TID WC  . insulin aspart  0-5 Units Subcutaneous QHS  . insulin glargine  18 Units Subcutaneous QHS  . isosorbide-hydrALAZINE  1 tablet Oral TID  . mouth rinse  15 mL Mouth Rinse BID  . pravastatin  40 mg Oral Daily  . sodium chloride flush   3 mL Intravenous Q12H     LOS: 3 days   Caffie Sotto C 12/12/2016,11:23 AM

## 2016-12-13 DIAGNOSIS — E1121 Type 2 diabetes mellitus with diabetic nephropathy: Secondary | ICD-10-CM

## 2016-12-13 DIAGNOSIS — N170 Acute kidney failure with tubular necrosis: Secondary | ICD-10-CM

## 2016-12-13 DIAGNOSIS — E1165 Type 2 diabetes mellitus with hyperglycemia: Secondary | ICD-10-CM

## 2016-12-13 LAB — RENAL FUNCTION PANEL
ALBUMIN: 2.7 g/dL — AB (ref 3.5–5.0)
ANION GAP: 11 (ref 5–15)
BUN: 71 mg/dL — ABNORMAL HIGH (ref 6–20)
CALCIUM: 8.5 mg/dL — AB (ref 8.9–10.3)
CO2: 18 mmol/L — ABNORMAL LOW (ref 22–32)
Chloride: 102 mmol/L (ref 101–111)
Creatinine, Ser: 6.67 mg/dL — ABNORMAL HIGH (ref 0.44–1.00)
GFR calc Af Amer: 7 mL/min — ABNORMAL LOW (ref >60)
GFR, EST NON AFRICAN AMERICAN: 6 mL/min — AB (ref >60)
GLUCOSE: 174 mg/dL — AB (ref 65–99)
PHOSPHORUS: 5.6 mg/dL — AB (ref 2.5–4.6)
Potassium: 4.3 mmol/L (ref 3.5–5.1)
SODIUM: 131 mmol/L — AB (ref 135–145)

## 2016-12-13 LAB — GLUCOSE, CAPILLARY
GLUCOSE-CAPILLARY: 173 mg/dL — AB (ref 65–99)
Glucose-Capillary: 200 mg/dL — ABNORMAL HIGH (ref 65–99)
Glucose-Capillary: 259 mg/dL — ABNORMAL HIGH (ref 65–99)
Glucose-Capillary: 259 mg/dL — ABNORMAL HIGH (ref 65–99)

## 2016-12-13 MED ORDER — METOPROLOL TARTRATE 50 MG PO TABS
50.0000 mg | ORAL_TABLET | Freq: Two times a day (BID) | ORAL | Status: DC
  Administered 2016-12-13 – 2016-12-28 (×31): 50 mg via ORAL
  Filled 2016-12-13 (×8): qty 1
  Filled 2016-12-13: qty 2
  Filled 2016-12-13 (×22): qty 1

## 2016-12-13 NOTE — Progress Notes (Signed)
Inpatient Diabetes Program Recommendations  AACE/ADA: New Consensus Statement on Inpatient Glycemic Control (2015)  Target Ranges:  Prepandial:   less than 140 mg/dL      Peak postprandial:   less than 180 mg/dL (1-2 hours)      Critically ill patients:  140 - 180 mg/dL   Lab Results  Component Value Date   GLUCAP 259 (H) 12/13/2016   HGBA1C 7.3 (H) 12/10/2016    Review of Glycemic ControlResults for IDONIA, ZOLLINGER (MRN [id number]) as of 12/13/2016 12:18  Ref. Range 12/12/2016 17:38 12/12/2016 22:21 12/13/2016 08:03 12/13/2016 11:26  Glucose-Capillary Latest Ref Range: 65 - 99 mg/dL 232 (H) 229 (H) 200 (H) 259 (H)   Diabetes history: Type 2 diabetes Outpatient Diabetes medications: Glipizide 10 mg bid, Metformin 1000 mg bid Current orders for Inpatient glycemic control:  Novolog moderate tid with meals and HS Lanuts 18 units q HS  Inpatient Diabetes Program Recommendations:   Consider adding Novolog meal coverage 3 units tid with meals (hold if patient eats less than 50%).  Thanks, Adah Perl, RN, BC-ADM Inpatient Diabetes Coordinator Pager [phone] (8a-5p)

## 2016-12-13 NOTE — Evaluation (Signed)
Occupational Therapy Evaluation Patient Details Name: Phyllis Hernandez MRN: [id number] DOB: February 22, 1951 Today's Date: 12/13/2016    History of Present Illness Patient is a 66 y/o female who presents from home with dyspnea. Found to have HTN emergency with acute diastolic CHF. PMH includes HTN, HLD, depression.   Clinical Impression   Pt was independent prior to admission, but reports fatiguing easily and struggling to manage LB dressing and IADL. Pt requires and extended amount of time and fatigues with donning socks this visit. She requires supervision for mobility due to extended hospitalization. Pt lives alone and needs to function independently to return home. Will educate in energy conservation and LB AE. Do not anticipate pt will need post acute OT. Recommended mobility tech see this pt to RN.    Follow Up Recommendations  No OT follow up    Equipment Recommendations  None recommended by OT    Recommendations for Other Services       Precautions / Restrictions Precautions Precautions: Fall Restrictions Weight Bearing Restrictions: No      Mobility Bed Mobility Overal bed mobility: Modified Independent              Transfers Overall transfer level: Needs assistance Equipment used: None Transfers: Sit to/from Stand Sit to Stand: Supervision         General transfer comment: Supervision for safety.     Balance Overall balance assessment: Needs assistance Sitting-balance support: Feet supported;No upper extremity supported Sitting balance-Leahy Scale: Fair     Standing balance support: During functional activity Standing balance-Leahy Scale: Fair                             ADL either performed or assessed with clinical judgement   ADL Overall ADL's : Needs assistance/impaired Eating/Feeding: Independent;Sitting   Grooming: Supervision/safety;Standing;Wash/dry hands   Upper Body Bathing: Set up;Sitting   Lower Body Bathing:  Supervison/ safety;Sitting/lateral leans;Sit to/from stand   Upper Body Dressing : Set up;Sitting   Lower Body Dressing: Supervision/safety;Sit to/from stand   Toilet Transfer: Min guard;Ambulation;BSC   Toileting- Water quality scientist and Hygiene: Supervision/safety;Sit to/from stand       Functional mobility during ADLs: Min guard General ADL Comments: Pt with decreased activity tolerance and requires an extended period of time for LB dressing.     Vision Patient Visual Report: No change from baseline Vision Assessment?: No apparent visual deficits     Perception     Praxis      Pertinent Vitals/Pain Pain Assessment: No/denies pain     Hand Dominance Right   Extremity/Trunk Assessment Upper Extremity Assessment Upper Extremity Assessment: Overall WFL for tasks assessed   Lower Extremity Assessment Lower Extremity Assessment: Defer to PT evaluation RLE Sensation: decreased light touch LLE Sensation: decreased light touch       Communication Communication Communication: No difficulties   Cognition Arousal/Alertness: Awake/alert Behavior During Therapy: Flat affect Overall Cognitive Status: Within Functional Limits for tasks assessed                                 General Comments: Slow to respond at times, but seems [facility] for basic tasks.   General Comments  RR up to 45. Sitting BP 150/73; 87 bpm. Post activity BP 195/68, 109 bpm.    Exercises     Shoulder Instructions      Home Living Family/patient expects to be discharged  to:: Private residence Living Arrangements: Alone Available Help at Discharge: Available PRN/intermittently Type of Home: House Home Access: Level entry     [place]: One level     Bathroom Shower/Tub: Tub/shower unit;Curtain   [place]: Grab bars - tub/shower   Additional Comments: pt prefers to get down in tub to bathe, does not like a shower      Prior  Functioning/Environment Level of Independence: Independent        Comments: Does not drive.         OT Problem List: Decreased strength;Decreased activity tolerance;Impaired balance (sitting and/or standing);Cardiopulmonary status limiting activity;Obesity;Decreased knowledge of use of DME or AE      OT Treatment/Interventions: Self-care/ADL training;DME and/or AE instruction;Energy conservation;Patient/family education;Balance training;Therapeutic activities    OT Goals(Current goals can be found in the care plan section) Acute Rehab OT Goals Patient Stated Goal: to feel better OT Goal Formulation: With patient Time For Goal Achievement: 12/27/16 Potential to Achieve Goals: Good ADL Goals Pt Will Perform Grooming: Independently;standing Pt Will Perform Lower Body Bathing: with modified independence;sit to/from stand;with adaptive equipment Pt Will Perform Lower Body Dressing: with modified independence;with adaptive equipment;sit to/from stand Pt Will Transfer to Toilet: with modified independence;ambulating;regular height toilet Pt Will Perform Toileting - Clothing Manipulation and hygiene: with modified independence;sit to/from stand Pt Will Perform Tub/Shower Transfer: Tub transfer;with modified independence;ambulating Additional ADL Goal #1: Pt will state at least 3 technique for conserving energy as instructed.  OT Frequency: Min 2X/week   Barriers to D/C:            Co-evaluation              AM-PAC PT "6 Clicks" Daily Activity     Outcome Measure Help from another person eating meals?: None Help from another person taking care of personal grooming?: A Little Help from another person toileting, which includes using toliet, bedpan, or urinal?: A Little Help from another person bathing (including washing, rinsing, drying)?: A Little Help from another person to put on and taking off regular upper body clothing?: None Help from another person to put on and taking  off regular lower body clothing?: A Little 6 Click Score: 20   End of Session    Activity Tolerance: Patient tolerated treatment well Patient left: in bed;with call bell/phone within reach  OT Visit Diagnosis: Unsteadiness on feet (R26.81);Muscle weakness (generalized) (M62.81) (decreased activity tolerance)                Time: 2263-3354 OT Time Calculation (min): 16 min Charges:  OT General Charges $OT Visit: 1 Procedure OT Evaluation $OT Eval Moderate Complexity: 1 Procedure G-Codes:      Malka So 12/13/2016, 2:45 PM  [phone]

## 2016-12-13 NOTE — Progress Notes (Signed)
Tech offered Pt a bath. Pt stated she would like to wait until later.

## 2016-12-13 NOTE — Evaluation (Signed)
Physical Therapy Evaluation Patient Details Name: CHARNITA TRUDEL MRN: [id number] DOB: 1951-06-03 Today's Date: 12/13/2016   History of Present Illness  Patient is a 66 y/o female who presents from home with dyspnea. Found to have HTN emergency with acute diastolic CHF. PMH includes HTN, HLD, depression.  Clinical Impression  Patient presents with generalized weakness, dyspnea on exertion and impaired mobility s/p above. Tolerated gait training with Min guard assist for safety with staggering and instability noted but no overt LOB. Pt independent PTA but does not drive. Will plan for higher level balance activities next session to ensure safe discharge home alone. Pt with elevated BP and RR during mobility. Will follow to maximize independence and mobility.    Follow Up Recommendations No PT follow up;Supervision - Intermittent    Equipment Recommendations  None recommended by PT    Recommendations for Other Services OT consult     Precautions / Restrictions Precautions Precautions: Fall Restrictions Weight Bearing Restrictions: No      Mobility  Bed Mobility               General bed mobility comments: Sitting EOB upon PT arrival.   Transfers Overall transfer level: Needs assistance Equipment used: None Transfers: Sit to/from Stand Sit to Stand: Supervision         General transfer comment: Supervision for safety.   Ambulation/Gait Ambulation/Gait assistance: Min guard Ambulation Distance (Feet): 140 Feet Assistive device: None Gait Pattern/deviations: Step-through pattern;Decreased stride length;Staggering left;Staggering right Gait velocity: decreased   General Gait Details: Slow, mildly unsteady gait with staggering noted left/right but no overt LOB. 2/4 DOE.  Stairs            Wheelchair Mobility    Modified Rankin (Stroke Patients Only)       Balance Overall balance assessment: Needs assistance Sitting-balance support: Feet  supported;No upper extremity supported Sitting balance-Leahy Scale: Good     Standing balance support: During functional activity Standing balance-Leahy Scale: Fair                               Pertinent Vitals/Pain Pain Assessment: No/denies pain    Home Living Family/patient expects to be discharged to:: Private residence Living Arrangements: Alone Available Help at Discharge: Available PRN/intermittently Type of Home: House Home Access: Level entry     Home Layout: One level Home Equipment: None      Prior Function Level of Independence: Independent         Comments: Does not drive.      Hand Dominance        Extremity/Trunk Assessment   Upper Extremity Assessment Upper Extremity Assessment: Defer to OT evaluation    Lower Extremity Assessment Lower Extremity Assessment: Generalized weakness;LLE deficits/detail;RLE deficits/detail RLE Sensation: decreased light touch LLE Sensation: decreased light touch       Communication   Communication: No difficulties  Cognition Arousal/Alertness: Awake/alert Behavior During Therapy: Flat affect Overall Cognitive Status: Within Functional Limits for tasks assessed                                 General Comments: Slow to respond at times, but seems [facility] for basic tasks.      General Comments General comments (skin integrity, edema, etc.): RR up to 45. Sitting BP 150/73; 87 bpm. Post activity BP 195/68, 109 bpm.    Exercises  Assessment/Plan    PT Assessment Patient needs continued PT services  PT Problem List Decreased mobility;Impaired sensation;Decreased balance;Cardiopulmonary status limiting activity;Decreased activity tolerance       PT Treatment Interventions Therapeutic activities;Gait training;Therapeutic exercise;Patient/family education;Balance training;Functional mobility training    PT Goals (Current goals can be found in the Care Plan section)  Acute Rehab PT  Goals Patient Stated Goal: to feel better PT Goal Formulation: With patient Time For Goal Achievement: 12/27/16 Potential to Achieve Goals: Fair    Frequency Min 3X/week   Barriers to discharge Decreased caregiver support lives alone    Co-evaluation               AM-PAC PT "6 Clicks" Daily Activity  Outcome Measure Difficulty turning over in bed (including adjusting bedclothes, sheets and blankets)?: None Difficulty moving from lying on back to sitting on the side of the bed? : None Difficulty sitting down on and standing up from a chair with arms (e.g., wheelchair, bedside commode, etc,.)?: None Help needed moving to and from a bed to chair (including a wheelchair)?: A Little Help needed walking in hospital room?: A Little Help needed climbing 3-5 steps with a railing? : A Little 6 Click Score: 21    End of Session Equipment Utilized During Treatment: Gait belt Activity Tolerance: Patient limited by fatigue Patient left: in bed;with call bell/phone within reach (sitting EOB.) Nurse Communication: Mobility status PT Visit Diagnosis: Unsteadiness on feet (R26.81)    Time: 6333-5456 PT Time Calculation (min) (ACUTE ONLY): 17 min   Charges:   PT Evaluation $PT Eval Low Complexity: 1 Procedure     PT G Codes:        Wray Kearns, PT, DPT [phone]    Lia.Love A Cyniah Gossard 12/13/2016, 11:44 AM

## 2016-12-13 NOTE — Progress Notes (Signed)
PROGRESS NOTE    Phyllis Hernandez  FBP:[id number] DOB: June 12, 1951 DOA: 12/09/2016 PCP: Everardo Beals, NP   Brief Narrative:  Phyllis Hernandez is a 66 y.o. BF PMHx NIDDM and HTN and CKD IV? who presents with acute dyspnea.  The patient was in her usual state of health until a week or so ago when she was talking on the phone, had an episode which she could hardly breathing had to sit down, and the feeling slowly passed by itself.  Since then she has been fine, had noticed a little leg swelling yesterday, but has been taking her BP meds normally, going about her normal business today (went to the store, walked around, did some housework, all without difficulty) then tonight around midnight, she was getting ready for bed and started to feel SOB, sweaty and nervous.  She tried to cough but couldn't cough anything up, and the SOB got worse and worse until she panicked and called 9-1-1.  EMS found her SpO2 70% and BP 250/140 and transported.    Subjective: 6/26  A/O 4, negative CP, negative SOB, negative N/V. States was taking her BP medication appropriately. Not on home O2.   Assessment & Plan:   Principal Problem:   Acute on chronic renal failure (HCC) Active Problems:   Type 2 diabetes mellitus with complication, without long-term current use of insulin (HCC)   Essential hypertension   Hypertensive emergency   Hypertensive emergency  -Off Nitroglycerin gtt -Echocardiogram: Normal see results below -Strict I/Os, since admission -2.7 L -daily weights Filed Weights   12/11/16 0546 12/12/16 2000 12/13/16 0500  Weight: 220 lb (99.8 kg) 228 lb 9.9 oz (103.7 kg) 221 lb 9.6 oz (100.5 kg)     Hypertension:  -Bidil 20-30 7.5 mg TID -Metoprolol 50 mg BID  Acute on chronic renal failure (Baseline Cr 2.67 as of Aug 2017)    -Unclear baseline in our records, she suspects baseline Cr is ~4?  Follows with nephrology.  Mild non-gap acidosis.  Lab Results  Component Value Date   CREATININE 6.67 (H) 12/13/2016   CREATININE 6.51 (H) 12/12/2016   CREATININE 5.83 (H) 12/11/2016  -nephrology following HD not indicated at this point   DM type II   uncontrolled with renal complications -Lantus 18 units daily -Moderate SSI    DVT prophylaxis: Lovenox Code Status: Full Family Communication: None Disposition Plan: TBD   Consultants:  Nephrology Dr. Erling Cruz    Procedures/Significant Events:  6/23 Echocardiogram: LVEF=  60% to 65%.    VENTILATOR SETTINGS: None   Cultures None  Antimicrobials: Anti-infectives    None       Devices None   LINES / TUBES:  None    Continuous Infusions:    Objective: Vitals:   12/12/16 2221 12/12/16 2244 12/13/16 0000 12/13/16 0500  BP: (!) 132/117 (!) 142/59 (!) 161/86 (!) 142/82  Pulse: 79  71 90  Resp: (!) 22  (!) 26 (!) 21  Temp: 98.7 F (37.1 C)  98.6 F (37 C) 99.6 F (37.6 C)  TempSrc: Oral  Oral Oral  SpO2: 100%  100% 100%  Weight:    221 lb 9.6 oz (100.5 kg)  Height:        Intake/Output Summary (Last 24 hours) at 12/13/16 0801 Last data filed at 12/13/16 0600  Gross per 24 hour  Intake                0 ml  Output  1 ml  Net               -1 ml   Filed Weights   12/11/16 0546 12/12/16 2000 12/13/16 0500  Weight: 220 lb (99.8 kg) 228 lb 9.9 oz (103.7 kg) 221 lb 9.6 oz (100.5 kg)    Examination:  General: A/O 4, No acute respiratory distress Eyes: negative scleral hemorrhage, negative anisocoria, negative icterus ENT: Negative Runny nose, negative gingival bleeding, Neck:  Negative scars, masses, torticollis, lymphadenopathy, JVD Lungs: Clear to auscultation bilaterally without wheezes or crackles Cardiovascular: Regular rate and rhythm without murmur gallop or rub normal S1 and S2 Abdomen: negative abdominal pain, nondistended, positive soft, bowel sounds, no rebound, no ascites, no appreciable mass Extremities: No significant cyanosis, clubbing, or edema  bilateral lower extremities  .     Data Reviewed: Care during the described time interval was provided by me .  I have reviewed this patient's available data, including medical history, events of note, physical examination, and all test results as part of my evaluation. I have personally reviewed and interpreted all radiology studies.  CBC:  Recent Labs Lab 12/09/16 0148 12/09/16 0424 12/10/16 0322 12/11/16 0424  WBC  --  10.4 9.9 9.5  NEUTROABS  --  8.5* 7.6  --   HGB 12.2 10.9* 9.4* 9.4*  HCT 36.0 34.4* 30.1* 29.9*  MCV  --  71.2* 70.3* 69.9*  PLT  --  148* 140* 536*   Basic Metabolic Panel:  Recent Labs Lab 12/09/16 0148 12/10/16 0322 12/11/16 0424 12/12/16 0440 12/13/16 0544  NA 137  140 133* 132* 132* 131*  K 4.9  4.8 5.1 4.8 4.4 4.3  CL 107  109 105 104 103 102  CO2 18* 17* 18* 17* 18*  GLUCOSE 219*  219* 246* 166* 148* 174*  BUN 57*  55* 59* 58* 67* 71*  CREATININE 5.16*  5.30* 5.28* 5.83* 6.51* 6.67*  CALCIUM 9.3 9.2 9.0 8.3* 8.5*  MG  --  1.5* 2.1  --   --   PHOS  --   --  4.3 5.5* 5.6*   GFR: Estimated Creatinine Clearance: 10.1 mL/min (A) (by C-G formula based on SCr of 6.67 mg/dL (H)). Liver Function Tests:  Recent Labs Lab 12/09/16 0148 12/11/16 0424 12/12/16 0440 12/13/16 0544  AST 24  --   --   --   ALT 21  --   --   --   ALKPHOS 73  --   --   --   BILITOT 0.6  --   --   --   PROT 8.0  --   --   --   ALBUMIN 3.6 3.0* 2.6* 2.7*   No results for input(s): LIPASE, AMYLASE in the last 168 hours. No results for input(s): AMMONIA in the last 168 hours. Coagulation Profile: No results for input(s): INR, PROTIME in the last 168 hours. Cardiac Enzymes:  Recent Labs Lab 12/09/16 1029 12/09/16 1800  TROPONINI 0.13* 0.13*   BNP (last 3 results) No results for input(s): PROBNP in the last 8760 hours. HbA1C: No results for input(s): HGBA1C in the last 72 hours. CBG:  Recent Labs Lab 12/11/16 2130 12/12/16 0850 12/12/16 1252  12/12/16 1738 12/12/16 2221  GLUCAP 176* 187* 151* 232* 229*   Lipid Profile: No results for input(s): CHOL, HDL, LDLCALC, TRIG, CHOLHDL, LDLDIRECT in the last 72 hours. Thyroid Function Tests: No results for input(s): TSH, T4TOTAL, FREET4, T3FREE, THYROIDAB in the last 72 hours. Anemia Panel: No results for  input(s): VITAMINB12, FOLATE, FERRITIN, TIBC, IRON, RETICCTPCT in the last 72 hours. Urine analysis: No results found for: COLORURINE, APPEARANCEUR, LABSPEC, PHURINE, GLUCOSEU, HGBUR, BILIRUBINUR, KETONESUR, PROTEINUR, UROBILINOGEN, NITRITE, LEUKOCYTESUR Sepsis Labs: @LABRCNTIP (procalcitonin:4,lacticidven:4)  ) Recent Results (from the past 240 hour(s))  MRSA PCR Screening     Status: None   Collection Time: 12/09/16  4:55 AM  Result Value Ref Range Status   MRSA by PCR NEGATIVE NEGATIVE Final    Comment:        The GeneXpert MRSA Assay (FDA approved for NASAL specimens only), is one component of a comprehensive MRSA colonization surveillance program. It is not intended to diagnose MRSA infection nor to guide or monitor treatment for MRSA infections.          Radiology Studies: No results found.      Scheduled Meds: . aspirin  81 mg Oral Daily  . carvedilol  3.125 mg Oral BID WC  . heparin subcutaneous  5,000 Units Subcutaneous Q8H  . insulin aspart  0-15 Units Subcutaneous TID WC  . insulin aspart  0-5 Units Subcutaneous QHS  . insulin glargine  18 Units Subcutaneous QHS  . isosorbide-hydrALAZINE  1 tablet Oral TID  . mouth rinse  15 mL Mouth Rinse BID  . pravastatin  40 mg Oral Daily  . sodium chloride flush  3 mL Intravenous Q12H   Continuous Infusions:    LOS: 4 days    Time spent: 10 minutes    Fynn Vanblarcom, Geraldo Docker, MD [facility] Pager [phone]   If 7PM-7AM, please contact night-coverage [email or web address] Password [email or web address] 12/13/2016, 8:01 AM

## 2016-12-13 NOTE — Progress Notes (Signed)
Assessment/ Plan:   1. AKI on CKD: due to [facility] emergency. Non-oliguric and plateauing  2. HTNsive emergency: with acute diastolic CHF exac, much improved on minimal meds (atypical); will check duplex renal scan.    Subjective: Interval History: Ok  Objective: Vital signs in last 24 hours: Temp:  [97.8 F (36.6 C)-99.6 F (37.6 C)] 98.8 F (37.1 C) (06/26 0804) Pulse Rate:  [71-90] 82 (06/26 0804) Resp:  [20-26] 20 (06/26 0804) BP: (132-175)/(58-117) 137/58 (06/26 0804) SpO2:  [96 %-100 %] 98 % (06/26 0804) Weight:  [100.5 kg (221 lb 9.6 oz)-103.7 kg (228 lb 9.9 oz)] 100.5 kg (221 lb 9.6 oz) (06/26 0500) Weight change:   Intake/Output from previous day: 06/25 0701 - 06/26 0700 In: -  Out: 1 [Urine:1] Intake/Output this shift: No intake/output data recorded.  General appearance: alert and cooperative Resp: clear to auscultation bilaterally Cardio: regular rate and rhythm, S1, S2 normal, no murmur, click, rub or gallop Extremities: edema tr chronic changes on right  Lab Results:  Recent Labs  12/11/16 0424  WBC 9.5  HGB 9.4*  HCT 29.9*  PLT 123*   BMET:  Recent Labs  12/12/16 0440 12/13/16 0544  NA 132* 131*  K 4.4 4.3  CL 103 102  CO2 17* 18*  GLUCOSE 148* 174*  BUN 67* 71*  CREATININE 6.51* 6.67*  CALCIUM 8.3* 8.5*   No results for input(s): PTH in the last 72 hours. Iron Studies: No results for input(s): IRON, TIBC, TRANSFERRIN, FERRITIN in the last 72 hours. Studies/Results: No results found.  Scheduled: . aspirin  81 mg Oral Daily  . carvedilol  3.125 mg Oral BID WC  . heparin subcutaneous  5,000 Units Subcutaneous Q8H  . insulin aspart  0-15 Units Subcutaneous TID WC  . insulin aspart  0-5 Units Subcutaneous QHS  . insulin glargine  18 Units Subcutaneous QHS  . isosorbide-hydrALAZINE  1 tablet Oral TID  . mouth rinse  15 mL Mouth Rinse BID  . pravastatin  40 mg Oral Daily  . sodium chloride flush  3 mL Intravenous Q12H    LOS: 4 days    Angelik Walls C 12/13/2016,8:08 AM

## 2016-12-13 NOTE — Plan of Care (Signed)
Problem: Safety: Goal: Ability to remain free from injury will improve Outcome: Progressing No falls during this admission. Call bell within reach. Bed in low and locked position. Patient alert and oriented. Nonskid footwear being utilized. 3/4 siderails in place. Clean and clear environment maintained. Patient verbalized understanding of safety instruction.  Problem: Activity: Goal: Capacity to carry out activities will improve Outcome: Progressing Patient able to move around her room independently and without SOB.

## 2016-12-14 ENCOUNTER — Inpatient Hospital Stay (HOSPITAL_COMMUNITY): Payer: Medicaid Other

## 2016-12-14 DIAGNOSIS — N179 Acute kidney failure, unspecified: Secondary | ICD-10-CM

## 2016-12-14 DIAGNOSIS — R601 Generalized edema: Secondary | ICD-10-CM

## 2016-12-14 LAB — CBC
HCT: 29.1 % — ABNORMAL LOW (ref 36.0–46.0)
HEMOGLOBIN: 9.4 g/dL — AB (ref 12.0–15.0)
MCH: 22.8 pg — AB (ref 26.0–34.0)
MCHC: 32.3 g/dL (ref 30.0–36.0)
MCV: 70.6 fL — ABNORMAL LOW (ref 78.0–100.0)
Platelets: 160 10*3/uL (ref 150–400)
RBC: 4.12 MIL/uL (ref 3.87–5.11)
RDW: 15.2 % (ref 11.5–15.5)
WBC: 7 10*3/uL (ref 4.0–10.5)

## 2016-12-14 LAB — RENAL FUNCTION PANEL
ANION GAP: 11 (ref 5–15)
Albumin: 2.7 g/dL — ABNORMAL LOW (ref 3.5–5.0)
BUN: 70 mg/dL — ABNORMAL HIGH (ref 6–20)
CALCIUM: 8.5 mg/dL — AB (ref 8.9–10.3)
CO2: 18 mmol/L — AB (ref 22–32)
Chloride: 100 mmol/L — ABNORMAL LOW (ref 101–111)
Creatinine, Ser: 6.51 mg/dL — ABNORMAL HIGH (ref 0.44–1.00)
GFR calc non Af Amer: 6 mL/min — ABNORMAL LOW (ref >60)
GFR, EST AFRICAN AMERICAN: 7 mL/min — AB (ref >60)
Glucose, Bld: 408 mg/dL — ABNORMAL HIGH (ref 65–99)
Phosphorus: 6.1 mg/dL — ABNORMAL HIGH (ref 2.5–4.6)
Potassium: 4.6 mmol/L (ref 3.5–5.1)
SODIUM: 129 mmol/L — AB (ref 135–145)

## 2016-12-14 LAB — GLUCOSE, CAPILLARY
GLUCOSE-CAPILLARY: 149 mg/dL — AB (ref 65–99)
Glucose-Capillary: 153 mg/dL — ABNORMAL HIGH (ref 65–99)
Glucose-Capillary: 325 mg/dL — ABNORMAL HIGH (ref 65–99)
Glucose-Capillary: 287 mg/dL — ABNORMAL HIGH (ref 65–99)

## 2016-12-14 MED ORDER — INSULIN GLARGINE 100 UNIT/ML ~~LOC~~ SOLN
20.0000 [IU] | Freq: Every day | SUBCUTANEOUS | Status: DC
  Administered 2016-12-14 – 2016-12-17 (×4): 20 [IU] via SUBCUTANEOUS
  Filled 2016-12-14 (×5): qty 0.2

## 2016-12-14 MED ORDER — FUROSEMIDE 80 MG PO TABS
80.0000 mg | ORAL_TABLET | Freq: Once | ORAL | Status: AC
  Administered 2016-12-14: 80 mg via ORAL
  Filled 2016-12-14: qty 1

## 2016-12-14 MED ORDER — INSULIN ASPART 100 UNIT/ML ~~LOC~~ SOLN
3.0000 [IU] | Freq: Three times a day (TID) | SUBCUTANEOUS | Status: DC
  Administered 2016-12-15 – 2016-12-18 (×7): 3 [IU] via SUBCUTANEOUS

## 2016-12-14 NOTE — Progress Notes (Signed)
Assessment/ Plan:   1. AKI on CKD: due to [facility] emergency.Non-oliguric and plateauing  2. HTNsive emergency: withacute diastolic CHF exac, much improved on minimal meds (atypical); will F/u duplex renal scan.  3. Hyponatremia.  Furosemide x 1, cont fluid restrict  Subjective: Interval History: UOP not properly recorded  Objective: Vital signs in last 24 hours: Temp:  [97.5 F (36.4 C)-98.7 F (37.1 C)] 98.5 F (36.9 C) (06/27 0741) Pulse Rate:  [60-69] 69 (06/27 0741) Resp:  [20-24] 24 (06/27 0741) BP: (122-162)/(58-78) 162/66 (06/27 0741) SpO2:  [96 %-100 %] 96 % (06/27 0741) Weight:  [101.8 kg (224 lb 6.4 oz)] 101.8 kg (224 lb 6.4 oz) (06/27 0508) Weight change: -1.913 kg (-4 lb 3.5 oz)  Intake/Output from previous day: 06/26 0701 - 06/27 0700 In: 240 [P.O.:240] Out: -  Intake/Output this shift: No intake/output data recorded.  General appearance: alert and cooperative Resp: clear to auscultation bilaterally Chest wall: no tenderness Cardio: regular rate and rhythm, S1, S2 normal, no murmur, click, rub or gallop  Lab Results: No results for input(s): WBC, HGB, HCT, PLT in the last 72 hours. BMET:  Recent Labs  12/12/16 0440 12/13/16 0544  NA 132* 131*  K 4.4 4.3  CL 103 102  CO2 17* 18*  GLUCOSE 148* 174*  BUN 67* 71*  CREATININE 6.51* 6.67*  CALCIUM 8.3* 8.5*   6/27 BUN 70, creat 6.51  No results for input(s): PTH in the last 72 hours. Iron Studies: No results for input(s): IRON, TIBC, TRANSFERRIN, FERRITIN in the last 72 hours. Studies/Results: No results found.  Scheduled: . aspirin  81 mg Oral Daily  . heparin subcutaneous  5,000 Units Subcutaneous Q8H  . insulin aspart  0-15 Units Subcutaneous TID WC  . insulin aspart  0-5 Units Subcutaneous QHS  . insulin glargine  18 Units Subcutaneous QHS  . isosorbide-hydrALAZINE  1 tablet Oral TID  . mouth rinse  15 mL Mouth Rinse BID  . metoprolol tartrate  50 mg Oral BID  . pravastatin  40 mg  Oral Daily  . sodium chloride flush  3 mL Intravenous Q12H     LOS: 5 days   Jaizon Deroos C 12/14/2016,10:09 AM

## 2016-12-14 NOTE — Progress Notes (Signed)
Physical Therapy Treatment Patient Details Name: Phyllis Hernandez MRN: [id number] DOB: 1950/11/12 Today's Date: 12/14/2016    History of Present Illness Patient is a 67 y/o female who presents from home with dyspnea. Found to have HTN emergency with acute diastolic CHF. PMH includes HTN, HLD, depression.    PT Comments    Patient tolerated higher level balance challenges with deviations in gait and staggering noted but no overt LOB. Pt continues to demonstrate dyspnea on exertion- 3/4 during activity today. Fatigues quickly and not able to tolerate distances >100 feet very well. Education re: energy conservation techniques and importance of slowly increasing activity level/exercise as well as doing shorter bouts of activity. Will continue to follow.   Follow Up Recommendations  No PT follow up;Supervision - Intermittent     Equipment Recommendations  None recommended by PT    Recommendations for Other Services       Precautions / Restrictions Precautions Precautions: Fall Restrictions Weight Bearing Restrictions: No    Mobility  Bed Mobility Overal bed mobility: Modified Independent             General bed mobility comments: Sitting EOB upon PT arrival.   Transfers Overall transfer level: Needs assistance Equipment used: None Transfers: Sit to/from Stand Sit to Stand: Supervision         General transfer comment: Supervision for safety/lines.  Ambulation/Gait Ambulation/Gait assistance: Min guard Ambulation Distance (Feet): 200 Feet Assistive device: None Gait Pattern/deviations: Step-through pattern;Decreased stride length;Drifts right/left Gait velocity: decreased Gait velocity interpretation: Below normal speed for age/gender General Gait Details: Slow, mildly unsteady gait with drifting noted in both directions- reports toes feel stiff. 3/4 DOE. vSS. Fatigues.    Stairs            Wheelchair Mobility    Modified Rankin (Stroke Patients  Only)       Balance Overall balance assessment: Needs assistance Sitting-balance support: Feet supported;No upper extremity supported Sitting balance-Leahy Scale: Good     Standing balance support: During functional activity Standing balance-Leahy Scale: Fair               High level balance activites: Sudden stops;Head turns;Turns;Direction changes High Level Balance Comments: tolerated above with deviations in gait path and staggering but no overt LOB. Standardized Balance Assessment Standardized Balance Assessment : Dynamic Gait Index   Dynamic Gait Index Level Surface: Mild Impairment Gait with Horizontal Head Turns: Mild Impairment Gait with Vertical Head Turns: Mild Impairment Gait and Pivot Turn: Mild Impairment      Cognition Arousal/Alertness: Awake/alert Behavior During Therapy: Flat affect Overall Cognitive Status: Within Functional Limits for tasks assessed                                 General Comments: continues to have some slow response speed      Exercises      General Comments        Pertinent Vitals/Pain Pain Assessment: No/denies pain    Home Living                      Prior Function            PT Goals (current goals can now be found in the care plan section) Acute Rehab PT Goals Patient Stated Goal: to feel better Progress towards PT goals: Progressing toward goals    Frequency    Min 3X/week  PT Plan Current plan remains appropriate    Co-evaluation              AM-PAC PT "6 Clicks" Daily Activity  Outcome Measure  Difficulty turning over in bed (including adjusting bedclothes, sheets and blankets)?: None Difficulty moving from lying on back to sitting on the side of the bed? : None Difficulty sitting down on and standing up from a chair with arms (e.g., wheelchair, bedside commode, etc,.)?: None Help needed moving to and from a bed to chair (including a wheelchair)?: None Help  needed walking in hospital room?: A Little Help needed climbing 3-5 steps with a railing? : A Little 6 Click Score: 22    End of Session Equipment Utilized During Treatment: Gait belt Activity Tolerance: Patient limited by fatigue;Other (comment) (SOB.) Patient left: in bed;with call bell/phone within reach;Other (comment) (Blood draw tech) Nurse Communication: Mobility status PT Visit Diagnosis: Unsteadiness on feet (R26.81)     Time: 0071-2197 PT Time Calculation (min) (ACUTE ONLY): 13 min  Charges:  $Gait Training: 8-22 mins                    G Codes:       Wray Kearns, PT, DPT [phone]     [place] 12/14/2016, 1:48 PM

## 2016-12-14 NOTE — Progress Notes (Signed)
PT Cancellation Note  Patient Details Name: Phyllis Hernandez MRN: [id number] DOB: 1950/08/15   Cancelled Treatment:    Reason Eval/Treat Not Completed: Patient at procedure or test/unavailable Pt off floor at Vascular. Will follow up as time allows.   Marguarite Arbour A Ryleigh Esqueda 12/14/2016, 11:00 AM Wray Kearns, [place], DPT [phone]

## 2016-12-14 NOTE — Plan of Care (Signed)
Problem: Safety: Goal: Ability to remain free from injury will improve Outcome: Progressing Fall risk bundle in place. No injuries, falls or skin breakdown this shift. Will continue to monitor this shift.

## 2016-12-14 NOTE — Progress Notes (Signed)
Occupational Therapy Treatment Patient Details Name: Phyllis Hernandez MRN: [id number] DOB: April 22, 1951 Today's Date: 12/14/2016    History of present illness Patient is a 66 y/o female who presents from home with dyspnea. Found to have HTN emergency with acute diastolic CHF. PMH includes HTN, HLD, depression.   OT comments  Educated in energy conservation, provided handout. Pt issued and educated in use of AE and benefits of seated showering. Pt agreeable to recommendation for shower seat.   Follow Up Recommendations  No OT follow up    Equipment Recommendations  Tub/shower seat    Recommendations for Other Services      Precautions / Restrictions         Mobility Bed Mobility Overal bed mobility: Modified Independent                Transfers Overall transfer level: Needs assistance Equipment used: None Transfers: Sit to/from Stand Sit to Stand: Supervision         General transfer comment: Supervision for safety/lines    Balance     Sitting balance-Leahy Scale: Good     Standing balance support: During functional activity Standing balance-Leahy Scale: Fair                             ADL either performed or assessed with clinical judgement   ADL Overall ADL's : Needs assistance/impaired               Lower Body Bathing Details (indicate cue type and reason): educated in use of long handled bath sponge and reacher for washing and drying feet       Lower Body Dressing Details (indicate cue type and reason): educated in use of reacher, sock aide and long shoe horn               General ADL Comments: Instructed in energy conservation and reinforced with handout. Educated in benefits of seated showering. Pt agreeable to shower seat.     Vision       Perception     Praxis      Cognition Arousal/Alertness: Awake/alert Behavior During Therapy: Flat affect Overall Cognitive Status: Within Functional Limits for tasks  assessed                                 General Comments: continues to have some slow response speed        Exercises     Shoulder Instructions       General Comments      Pertinent Vitals/ Pain       Pain Assessment: No/denies pain  Home Living                                          Prior Functioning/Environment              Frequency  Min 2X/week        Progress Toward Goals  OT Goals(current goals can now be found in the care plan section)  Progress towards OT goals: Progressing toward goals  Acute Rehab OT Goals Patient Stated Goal: to feel better OT Goal Formulation: With patient Time For Goal Achievement: 12/27/16 Potential to Achieve Goals: Good  Plan Discharge plan remains appropriate    Co-evaluation  AM-PAC PT "6 Clicks" Daily Activity     Outcome Measure   Help from another person eating meals?: None Help from another person taking care of personal grooming?: A Little Help from another person toileting, which includes using toliet, bedpan, or urinal?: A Little Help from another person bathing (including washing, rinsing, drying)?: A Little Help from another person to put on and taking off regular upper body clothing?: None Help from another person to put on and taking off regular lower body clothing?: A Little 6 Click Score: 20    End of Session    OT Visit Diagnosis: Unsteadiness on feet (R26.81);Muscle weakness (generalized) (M62.81)   Activity Tolerance Patient tolerated treatment well   Patient Left in bed;with call bell/phone within reach   Nurse Communication          Time: 1610-9604 OT Time Calculation (min): 19 min  Charges: OT General Charges $OT Visit: 1 Procedure OT Treatments $Self Care/Home Management : 8-22 mins    Malka So 12/14/2016, 11:14 AM  [phone]

## 2016-12-14 NOTE — Progress Notes (Signed)
*  PRELIMINARY RESULTS* Vascular Ultrasound Renal Artery Duplex has been completed.   Findings suggest 1-59% right renal artery stenosis and >60% left renal artery stenosis. 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  12/14/2016 10:58 AM Maudry Mayhew, BS, RVT, RDCS, RDMS

## 2016-12-14 NOTE — Progress Notes (Signed)
[place] TEAM 1 - Stepdown/ICU TEAM  THEREASA Hernandez  YQM:[id number] DOB: 1950/12/28 DOA: 12/09/2016 PCP: Everardo Beals, NP    Brief Narrative:  66 y.o.F Hx DM, HTN, and CKD IV who presented with acute dyspnea. She summoned EMS, who found her SpO2 to be 70% and BP 250/140.  Subjective: Phyllis patient is resting comfortably in bed.  She is disappointed that she is not yet ready for discharge home.  She denies shortness of breath or chest pain.  She reports a good appetite.  Assessment & Plan:  Hypertensive emergency with acute diastolic CHF exacerbation BP improved - transitioned to oral medications - attempt to avoid Norvasc with patient's complaints of lower extremity edema - no ACEi/ARB due to kidney disease - avoid over-aggressive correction - net negative ~4L since admit  Filed Weights   12/12/16 2000 12/13/16 0500 12/14/16 0508  Weight: 103.7 kg (228 lb 9.9 oz) 100.5 kg (221 lb 9.6 oz) 101.8 kg (224 lb 6.4 oz)  .  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 Baseline crt 2.67 as of Aug 2017 - Nephrology following - hopeful that crt has now plateaud - cont to follow - Neph suggests trending further before d/c home    Recent Labs Lab 12/10/16 0322 12/11/16 0424 12/12/16 0440 12/13/16 0544 12/14/16 1417  CREATININE 5.28* 5.83* 6.51* 6.67* 6.51*   DM2 CBG not at goal - adjust tx and follow   Hypomagnesemia Corrected   Elevated troponin  Most c/w demand ischemia in setting of HTN emergency - no WMA on TTE - no indication for further inpt w/u   DVT prophylaxis: Lovenox Code Status: FULL CODE Family Communication: no family present at time of exam  Disposition Plan: follow renal fxn   Consultants:  Nephrology   Procedures: None  Antimicrobials:  None  Objective: Blood pressure (!) 148/67, pulse 67, temperature 98.2 F (36.8 C), temperature source Oral, resp. rate 14, height 5\' 5"  (1.651 m), weight 101.8 kg (224 lb 6.4 oz), SpO2 100  %.  Intake/Output Summary (Last 24 hours) at 12/14/16 1615 Last data filed at 12/14/16 1500  Gross per 24 hour  Intake                0 ml  Output             1300 ml  Net            -1300 ml   Filed Weights   12/12/16 2000 12/13/16 0500 12/14/16 0508  Weight: 103.7 kg (228 lb 9.9 oz) 100.5 kg (221 lb 9.6 oz) 101.8 kg (224 lb 6.4 oz)    Examination: General: No acute respiratory distress - alert  Lungs: CTA th/o w/o wheezing  Cardiovascular: RRR  Ext: Trace B LE edema   CBC:  Recent Labs Lab 12/09/16 0148 12/09/16 0424 12/10/16 0322 12/11/16 0424 12/14/16 1417  WBC  --  10.4 9.9 9.5 7.0  NEUTROABS  --  8.5* 7.6  --   --   HGB 12.2 10.9* 9.4* 9.4* 9.4*  HCT 36.0 34.4* 30.1* 29.9* 29.1*  MCV  --  71.2* 70.3* 69.9* 70.6*  PLT  --  148* 140* 123* 528   Basic Metabolic Panel:  Recent Labs Lab 12/10/16 0322 12/11/16 0424 12/12/16 0440 12/13/16 0544 12/14/16 1417  NA 133* 132* 132* 131* 129*  K 5.1 4.8 4.4 4.3 4.6  CL 105 104 103 102 100*  CO2 17* 18* 17* 18* 18*  GLUCOSE 246* 166* 148* 174* 408*  BUN 59* 58* 67* 71* 70*  CREATININE 5.28* 5.83* 6.51* 6.67* 6.51*  CALCIUM 9.2 9.0 8.3* 8.5* 8.5*  MG 1.5* 2.1  --   --   --   PHOS  --  4.3 5.5* 5.6* 6.1*   GFR: Estimated Creatinine Clearance: 10.5 mL/min (A) (by C-G formula based on SCr of 6.51 mg/dL (H)).  Liver Function Tests:  Recent Labs Lab 12/09/16 0148 12/11/16 0424 12/12/16 0440 12/13/16 0544 12/14/16 1417  AST 24  --   --   --   --   ALT 21  --   --   --   --   ALKPHOS 73  --   --   --   --   BILITOT 0.6  --   --   --   --   PROT 8.0  --   --   --   --   ALBUMIN 3.6 3.0* 2.6* 2.7* 2.7*   Cardiac Enzymes:  Recent Labs Lab 12/09/16 1029 12/09/16 1800  TROPONINI 0.13* 0.13*    CBG:  Recent Labs Lab 12/13/16 1126 12/13/16 1624 12/13/16 2134 12/14/16 0740 12/14/16 1142  GLUCAP 259* 173* 259* 153* 149*    Recent Results (from Phyllis past 240 hour(s))  MRSA PCR Screening     Status:  None   Collection Time: 12/09/16  4:55 AM  Result Value Ref Range Status   MRSA by PCR NEGATIVE NEGATIVE Final    Comment:        Phyllis GeneXpert MRSA Assay (FDA approved for NASAL specimens only), is one component of a comprehensive MRSA colonization surveillance program. It is not intended to diagnose MRSA infection nor to guide or monitor treatment for MRSA infections.      Scheduled Meds: . aspirin  81 mg Oral Daily  . furosemide  80 mg Oral Once  . heparin subcutaneous  5,000 Units Subcutaneous Q8H  . insulin aspart  0-15 Units Subcutaneous TID WC  . insulin aspart  0-5 Units Subcutaneous QHS  . insulin glargine  18 Units Subcutaneous QHS  . isosorbide-hydrALAZINE  1 tablet Oral TID  . mouth rinse  15 mL Mouth Rinse BID  . metoprolol tartrate  50 mg Oral BID  . pravastatin  40 mg Oral Daily  . sodium chloride flush  3 mL Intravenous Q12H     LOS: 5 days   Cherene Altes, MD [facility] Office  [phone] Pager - Text Page per Amion as per below:  On-Call/Text Page:      [email or web address]      password [email or web address]  If 7PM-7AM, please contact night-coverage [email or web address] Password [facility] 12/14/2016, 4:15 PM

## 2016-12-15 ENCOUNTER — Encounter (HOSPITAL_COMMUNITY): Payer: Self-pay | Admitting: General Surgery

## 2016-12-15 DIAGNOSIS — N183 Chronic kidney disease, stage 3 (moderate): Secondary | ICD-10-CM

## 2016-12-15 LAB — BASIC METABOLIC PANEL
ANION GAP: 12 (ref 5–15)
BUN: 73 mg/dL — AB (ref 6–20)
CALCIUM: 8.6 mg/dL — AB (ref 8.9–10.3)
CO2: 17 mmol/L — AB (ref 22–32)
Chloride: 102 mmol/L (ref 101–111)
Creatinine, Ser: 6.63 mg/dL — ABNORMAL HIGH (ref 0.44–1.00)
GFR calc Af Amer: 7 mL/min — ABNORMAL LOW (ref >60)
GFR calc non Af Amer: 6 mL/min — ABNORMAL LOW (ref >60)
GLUCOSE: 157 mg/dL — AB (ref 65–99)
Potassium: 4.3 mmol/L (ref 3.5–5.1)
Sodium: 131 mmol/L — ABNORMAL LOW (ref 135–145)

## 2016-12-15 LAB — GLUCOSE, CAPILLARY
GLUCOSE-CAPILLARY: 155 mg/dL — AB (ref 65–99)
GLUCOSE-CAPILLARY: 289 mg/dL — AB (ref 65–99)
GLUCOSE-CAPILLARY: 218 mg/dL — AB (ref 65–99)
Glucose-Capillary: 166 mg/dL — ABNORMAL HIGH (ref 65–99)

## 2016-12-15 LAB — PROTIME-INR
INR: 1.01
Prothrombin Time: 13.3 seconds (ref 11.4–15.2)

## 2016-12-15 MED ORDER — HEPARIN SODIUM (PORCINE) 5000 UNIT/ML IJ SOLN
5000.0000 [IU] | Freq: Three times a day (TID) | INTRAMUSCULAR | Status: DC
  Administered 2016-12-16 – 2016-12-22 (×15): 5000 [IU] via SUBCUTANEOUS
  Filled 2016-12-15 (×15): qty 1

## 2016-12-15 MED ORDER — LORAZEPAM 2 MG/ML IJ SOLN
1.0000 mg | Freq: Once | INTRAMUSCULAR | Status: DC
  Filled 2016-12-15 (×2): qty 1

## 2016-12-15 NOTE — Consult Note (Signed)
Chief Complaint: RAS  Referring Physician:Dr. Erling Cruz  Supervising Physician: Sandi Mariscal  Patient Status: [facility] - In-pt  HPI: Phyllis Hernandez is a 66 y.o. female who was admitted after feeling SOB, sweaty, and nervous.  She called 911 and her O2 was found to be 70% and her BP was 250/140.  She was admitted to the hospital for a hypertensive emergency.  She has been found to have acute on chronic renal failure as well.  She had a renal duplex that suggested RAS.  We have been asked to see the patient for possible angiogram and stenting.    Past Medical History:  Past Medical History:  Diagnosis Date  . Allergy   . Anemia   . Anemia in CKD (chronic kidney disease)   . Depression   . Hyperlipidemia   . Hypertension     Past Surgical History:  Past Surgical History:  Procedure Laterality Date  . TUBAL LIGATION      Family History:  Family History  Problem Relation Age of Onset  . Hypertension Mother   . Diabetes Mother   . Hypertension Maternal Grandmother   . Diabetes Maternal Grandmother     Social History:  reports that she has never smoked. She has never used smokeless tobacco. Her alcohol and drug histories are not on file.  Allergies: No Known Allergies  Medications: Medications reviewed in epic  Please HPI for pertinent positives, otherwise complete 10 system ROS negative.  Physical Exam: BP (!) 115/42 (BP Location: Right Arm)   Pulse 65   Temp 97.9 F (36.6 C) (Oral)   Resp 17   Ht '5\' 5"'  (1.651 m)   Wt 224 lb 8 oz (101.8 kg)   SpO2 98%   BMI 37.36 kg/m  Body mass index is 37.36 kg/m. General: pleasant, obese black female who is laying in bed in NAD HEENT: head is normocephalic, atraumatic.  Sclera are noninjected.  PERRL.  Ears and nose without any masses or lesions.  Mouth is pink and moist Heart: regular, rate, and rhythm.  Normal s1,s2. No obvious murmurs, gallops, or rubs noted.  Palpable radial and pedal pulses bilaterally Lungs: CTAB,  no wheezes, rhonchi, or rales noted.  Respiratory effort nonlabored Abd: soft, NT, ND, +BS, no masses, hernias, or organomegaly Psych: A&Ox3 with an appropriate affect.   Labs: Results for orders placed or performed during the hospital encounter of 12/09/16 (from the past 48 hour(s))  Glucose, capillary     Status: Abnormal   Collection Time: 12/13/16  4:24 PM  Result Value Ref Range   Glucose-Capillary 173 (H) 65 - 99 mg/dL  Glucose, capillary     Status: Abnormal   Collection Time: 12/13/16  9:34 PM  Result Value Ref Range   Glucose-Capillary 259 (H) 65 - 99 mg/dL  Glucose, capillary     Status: Abnormal   Collection Time: 12/14/16  7:40 AM  Result Value Ref Range   Glucose-Capillary 153 (H) 65 - 99 mg/dL   Comment 1 Document in Chart   Glucose, capillary     Status: Abnormal   Collection Time: 12/14/16 11:42 AM  Result Value Ref Range   Glucose-Capillary 149 (H) 65 - 99 mg/dL   Comment 1 Document in Chart   Renal function panel     Status: Abnormal   Collection Time: 12/14/16  2:17 PM  Result Value Ref Range   Sodium 129 (L) 135 - 145 mmol/L   Potassium 4.6 3.5 - 5.1 mmol/L  Chloride 100 (L) 101 - 111 mmol/L   CO2 18 (L) 22 - 32 mmol/L   Glucose, Bld 408 (H) 65 - 99 mg/dL   BUN 70 (H) 6 - 20 mg/dL   Creatinine, Ser 6.51 (H) 0.44 - 1.00 mg/dL   Calcium 8.5 (L) 8.9 - 10.3 mg/dL   Phosphorus 6.1 (H) 2.5 - 4.6 mg/dL   Albumin 2.7 (L) 3.5 - 5.0 g/dL   GFR calc non Af Amer 6 (L) >60 mL/min   GFR calc Af Amer 7 (L) >60 mL/min    Comment: (NOTE) The eGFR has been calculated using the CKD EPI equation. This calculation has not been validated in all clinical situations. eGFR's persistently <60 mL/min signify possible Chronic Kidney Disease.    Anion gap 11 5 - 15  CBC     Status: Abnormal   Collection Time: 12/14/16  2:17 PM  Result Value Ref Range   WBC 7.0 4.0 - 10.5 K/uL   RBC 4.12 3.87 - 5.11 MIL/uL   Hemoglobin 9.4 (L) 12.0 - 15.0 g/dL   HCT 29.1 (L) 36.0 - 46.0 %     MCV 70.6 (L) 78.0 - 100.0 fL   MCH 22.8 (L) 26.0 - 34.0 pg   MCHC 32.3 30.0 - 36.0 g/dL   RDW 15.2 11.5 - 15.5 %   Platelets 160 150 - 400 K/uL  Glucose, capillary     Status: Abnormal   Collection Time: 12/14/16  4:20 PM  Result Value Ref Range   Glucose-Capillary 325 (H) 65 - 99 mg/dL   Comment 1 Notify RN    Comment 2 Document in Chart   Glucose, capillary     Status: Abnormal   Collection Time: 12/14/16  9:13 PM  Result Value Ref Range   Glucose-Capillary 287 (H) 65 - 99 mg/dL  Basic metabolic panel     Status: Abnormal   Collection Time: 12/15/16  5:20 AM  Result Value Ref Range   Sodium 131 (L) 135 - 145 mmol/L   Potassium 4.3 3.5 - 5.1 mmol/L   Chloride 102 101 - 111 mmol/L   CO2 17 (L) 22 - 32 mmol/L   Glucose, Bld 157 (H) 65 - 99 mg/dL   BUN 73 (H) 6 - 20 mg/dL   Creatinine, Ser 6.63 (H) 0.44 - 1.00 mg/dL   Calcium 8.6 (L) 8.9 - 10.3 mg/dL   GFR calc non Af Amer 6 (L) >60 mL/min   GFR calc Af Amer 7 (L) >60 mL/min    Comment: (NOTE) The eGFR has been calculated using the CKD EPI equation. This calculation has not been validated in all clinical situations. eGFR's persistently <60 mL/min signify possible Chronic Kidney Disease.    Anion gap 12 5 - 15  Glucose, capillary     Status: Abnormal   Collection Time: 12/15/16  7:19 AM  Result Value Ref Range   Glucose-Capillary 166 (H) 65 - 99 mg/dL  Protime-INR     Status: None   Collection Time: 12/15/16  9:39 AM  Result Value Ref Range   Prothrombin Time 13.3 11.4 - 15.2 seconds   INR 1.01   Glucose, capillary     Status: Abnormal   Collection Time: 12/15/16 11:08 AM  Result Value Ref Range   Glucose-Capillary 155 (H) 65 - 99 mg/dL   Comment 1 Document in Chart     Imaging: No results found.  Assessment/Plan 1. Renal artery stenosis  I have discussed this case with Dr. Pascal Lux and Dr. Laurence Ferrari.  Dr. Laurence Ferrari has requested an MRA without contrast of her abdomen to assess her anatomy prior to any type of  intervention.  This will help him plan her procedure.  We will make her NPO p MN in case this can be done tomorrow.  Heparin will be held as well.    The patient had initially requested this procedure be done as an outpatient, but Dr. Florene Glen would like for it to be done as an inpatient.  The patient will need to be seen again and consented for this procedure.  Thank you for this interesting consult.  I greatly enjoyed meeting Phyllis Hernandez and look forward to participating in their care.  A copy of this report was sent to the requesting provider on this date.  Electronically Signed: Henreitta Cea 12/15/2016, 1:09 PM   I spent a total of 20 Minutes    in face to face in clinical consultation, greater than 50% of which was counseling/coordinating care for renal artery stenosis

## 2016-12-15 NOTE — Progress Notes (Signed)
I spoke with Claiborne Billings PA with Interventional Radiology who states patient wants Renal Angiogram done as OP.  Dr. Florene Glen paged and notified.  Sanda Linger

## 2016-12-15 NOTE — Plan of Care (Signed)
Problem: Physical Regulation: Goal: Ability to maintain clinical measurements within normal limits will improve Outcome: Not Progressing Vital Signs stable throughout shift. Denies any CP or SOB. Educated in diet and Fluid Restriction. Labs monitored.

## 2016-12-15 NOTE — Progress Notes (Signed)
Notified interventional radiology for preprocedure  Concerns. Patient is to remain NPO until radiology PA come up to discuss procedure and will obtain consent at that time. Time of procedure is unknown at this time. Patient is remaining NPO.

## 2016-12-15 NOTE — Progress Notes (Signed)
Assessment/ Plan:   1. AKI on CKD: due to [facility] emergency.Non-oliguric and plateauing  2. HTNsive emergency: withacute diastolic CHF exac, much improved on minimal meds (atypical); Duplex scan suggest > 60%L RAS and some R RAS.  Given clinical presentation and impending ESRD potential, I think CO2 renal angio worth pursuing.  IR consulted 3. Hyponatremia, improving  Subjective: Interval History: ok  Objective: Vital signs in last 24 hours: Temp:  [97.8 F (36.6 C)-98.7 F (37.1 C)] 97.9 F (36.6 C) (06/28 0721) Pulse Rate:  [62-72] 65 (06/28 0721) Resp:  [14-24] 17 (06/28 0721) BP: (115-148)/(42-67) 115/42 (06/28 0721) SpO2:  [95 %-100 %] 98 % (06/28 0721) Weight:  [101.8 kg (224 lb 8 oz)] 101.8 kg (224 lb 8 oz) (06/28 0406) Weight change: 0.045 kg (1.6 oz)  Intake/Output from previous day: 06/27 0701 - 06/28 0700 In: 940 [P.O.:940] Out: 2150 [Urine:2150] Intake/Output this shift: No intake/output data recorded.  General appearance: alert and cooperative Resp: clear to auscultation bilaterally Chest wall: no tenderness Cardio: regular rate and rhythm, S1, S2 normal, no murmur, click, rub or gallop  Lab Results:  Recent Labs  12/14/16 1417  WBC 7.0  HGB 9.4*  HCT 29.1*  PLT 160   BMET:  Recent Labs  12/14/16 1417 12/15/16 0520  NA 129* 131*  K 4.6 4.3  CL 100* 102  CO2 18* 17*  GLUCOSE 408* 157*  BUN 70* 73*  CREATININE 6.51* 6.63*  CALCIUM 8.5* 8.6*   No results for input(s): PTH in the last 72 hours. Iron Studies: No results for input(s): IRON, TIBC, TRANSFERRIN, FERRITIN in the last 72 hours. Studies/Results: No results found.  Scheduled: . aspirin  81 mg Oral Daily  . heparin subcutaneous  5,000 Units Subcutaneous Q8H  . insulin aspart  0-15 Units Subcutaneous TID WC  . insulin aspart  0-5 Units Subcutaneous QHS  . insulin aspart  3 Units Subcutaneous TID WC  . insulin glargine  20 Units Subcutaneous QHS  . isosorbide-hydrALAZINE  1  tablet Oral TID  . mouth rinse  15 mL Mouth Rinse BID  . metoprolol tartrate  50 mg Oral BID  . pravastatin  40 mg Oral Daily  . sodium chloride flush  3 mL Intravenous Q12H     LOS: 6 days   Messiah Rovira C 12/15/2016,10:53 AM

## 2016-12-15 NOTE — Progress Notes (Signed)
PROGRESS NOTE    Phyllis Hernandez  YJE:[id number] DOB: 22-Sep-1950 DOA: 12/09/2016 PCP: Everardo Beals, NP   Brief Narrative:  Phyllis Hernandez is a 66 y.o. BF PMHx NIDDM and HTN and CKD IV? who presents with acute dyspnea.  The patient was in her usual state of health until a week or so ago when she was talking on the phone, had an episode which she could hardly breathing had to sit down, and the feeling slowly passed by itself.  Since then she has been fine, had noticed a little leg swelling yesterday, but has been taking her BP meds normally, going about her normal business today (went to the store, walked around, did some housework, all without difficulty) then tonight around midnight, she was getting ready for bed and started to feel SOB, sweaty and nervous.  She tried to cough but couldn't cough anything up, and the SOB got worse and worse until she panicked and called 9-1-1.  EMS found her SpO2 70% and BP 250/140 and transported.    Subjective: 6/28  A/O 4, negative CP, negative SOB, negative N/V. States was taking her BP medication appropriately. Not on home O2. Patient refusing to have recommended CO2 renal angiogram. Stating she does not want to make any sort of decision. Dr. Adriana Mccallum Nephrology has been notified by RN   Assessment & Plan:   Principal Problem:   Acute on chronic renal failure [facility]) Active Problems:   Type 2 diabetes mellitus with complication, without long-term current use of insulin [facility])   Essential hypertension   Hypertensive emergency   Hypertensive emergency  -Off Nitroglycerin gtt -Echocardiogram: Normal see results below -Strict I/Os, since admission -5.3 L -daily weights Filed Weights   12/13/16 0500 12/14/16 0508 12/15/16 0406  Weight: 221 lb 9.6 oz (100.5 kg) 224 lb 6.4 oz (101.8 kg) 224 lb 8 oz (101.8 kg)  -Bidil 20-30 7.5 mg TID -Metoprolol 50 mg BID  Acute on chronic renal failure (Baseline Cr 2.67 as of Aug 2017)    -Unclear  baseline in our records, she suspects baseline Cr is ~4?  Follows with nephrology.  Mild non-gap acidosis.  Lab Results  Component Value Date   CREATININE 6.63 (H) 12/15/2016   CREATININE 6.51 (H) 12/14/2016   CREATININE 6.67 (H) 12/13/2016  -Patient has serious risk of ESRD, nephrology has recommendedCO2 Renal Angiogram. Patient refusing study, stating she wants to have study performed as an outpatient. -Counseled patient strongly that considering her current kidney function she should have procedure performed now as an inpatient and that waiting for an outpatient appointment put her at GRAVE risk of ESRD. - Nephrology has been paged, will await further recommendation  DM type II   uncontrolled with renal complications -Lantus 20 units daily -NovoLog 3 units QAC -Moderate SSI    DVT prophylaxis: Lovenox Code Status: Full Family Communication: None Disposition Plan: TBD   Consultants:  Nephrology Dr. Erling Cruz    Procedures/Significant Events:  6/23 Echocardiogram: LVEF=  60% to 65%.    VENTILATOR SETTINGS: None   Cultures None  Antimicrobials: Anti-infectives    None       Devices None   LINES / TUBES:  None    Continuous Infusions:    Objective: Vitals:   12/14/16 2141 12/14/16 2339 12/15/16 0406 12/15/16 0721  BP: (!) 146/64 129/60 (!) 129/57 (!) 115/42  Pulse: 72 62 63 65  Resp:   19 17  Temp:  98.6 F (37 C) 98.7 F (37.1 C)  97.9 F (36.6 C)  TempSrc:  Oral Oral Oral  SpO2:  100% 100% 98%  Weight:   224 lb 8 oz (101.8 kg)   Height:        Intake/Output Summary (Last 24 hours) at 12/15/16 0800 Last data filed at 12/15/16 0414  Gross per 24 hour  Intake              940 ml  Output             1850 ml  Net             -910 ml   Filed Weights   12/13/16 0500 12/14/16 0508 12/15/16 0406  Weight: 221 lb 9.6 oz (100.5 kg) 224 lb 6.4 oz (101.8 kg) 224 lb 8 oz (101.8 kg)    Examination:  General: A/O 4, No acute respiratory  distress Eyes: negative scleral hemorrhage, negative anisocoria, negative icterus ENT: Negative Runny nose, negative gingival bleeding, Neck:  Negative scars, masses, torticollis, lymphadenopathy, JVD Lungs: Clear to auscultation bilaterally without wheezes or crackles Cardiovascular: Regular rate and rhythm without murmur gallop or rub normal S1 and S2 Abdomen: negative abdominal pain, nondistended, positive soft, bowel sounds, no rebound, no ascites, no appreciable mass Extremities: No significant cyanosis, clubbing, or edema bilateral lower extremities  .     Data Reviewed: Care during the described time interval was provided by me .  I have reviewed this patient's available data, including medical history, events of note, physical examination, and all test results as part of my evaluation. I have personally reviewed and interpreted all radiology studies.  CBC:  Recent Labs Lab 12/09/16 0148 12/09/16 0424 12/10/16 0322 12/11/16 0424 12/14/16 1417  WBC  --  10.4 9.9 9.5 7.0  NEUTROABS  --  8.5* 7.6  --   --   HGB 12.2 10.9* 9.4* 9.4* 9.4*  HCT 36.0 34.4* 30.1* 29.9* 29.1*  MCV  --  71.2* 70.3* 69.9* 70.6*  PLT  --  148* 140* 123* 373   Basic Metabolic Panel:  Recent Labs Lab 12/10/16 0322 12/11/16 0424 12/12/16 0440 12/13/16 0544 12/14/16 1417 12/15/16 0520  NA 133* 132* 132* 131* 129* 131*  K 5.1 4.8 4.4 4.3 4.6 4.3  CL 105 104 103 102 100* 102  CO2 17* 18* 17* 18* 18* 17*  GLUCOSE 246* 166* 148* 174* 408* 157*  BUN 59* 58* 67* 71* 70* 73*  CREATININE 5.28* 5.83* 6.51* 6.67* 6.51* 6.63*  CALCIUM 9.2 9.0 8.3* 8.5* 8.5* 8.6*  MG 1.5* 2.1  --   --   --   --   PHOS  --  4.3 5.5* 5.6* 6.1*  --    GFR: Estimated Creatinine Clearance: 10.3 mL/min (A) (by C-G formula based on SCr of 6.63 mg/dL (H)). Liver Function Tests:  Recent Labs Lab 12/09/16 0148 12/11/16 0424 12/12/16 0440 12/13/16 0544 12/14/16 1417  AST 24  --   --   --   --   ALT 21  --   --   --    --   ALKPHOS 73  --   --   --   --   BILITOT 0.6  --   --   --   --   PROT 8.0  --   --   --   --   ALBUMIN 3.6 3.0* 2.6* 2.7* 2.7*   No results for input(s): LIPASE, AMYLASE in the last 168 hours. No results for input(s): AMMONIA in the last 168 hours. Coagulation  Profile: No results for input(s): INR, PROTIME in the last 168 hours. Cardiac Enzymes:  Recent Labs Lab 12/09/16 1029 12/09/16 1800  TROPONINI 0.13* 0.13*   BNP (last 3 results) No results for input(s): PROBNP in the last 8760 hours. HbA1C: No results for input(s): HGBA1C in the last 72 hours. CBG:  Recent Labs Lab 12/14/16 0740 12/14/16 1142 12/14/16 1620 12/14/16 2113 12/15/16 0719  GLUCAP 153* 149* 325* 287* 166*   Lipid Profile: No results for input(s): CHOL, HDL, LDLCALC, TRIG, CHOLHDL, LDLDIRECT in the last 72 hours. Thyroid Function Tests: No results for input(s): TSH, T4TOTAL, FREET4, T3FREE, THYROIDAB in the last 72 hours. Anemia Panel: No results for input(s): VITAMINB12, FOLATE, FERRITIN, TIBC, IRON, RETICCTPCT in the last 72 hours. Urine analysis: No results found for: COLORURINE, APPEARANCEUR, LABSPEC, PHURINE, GLUCOSEU, HGBUR, BILIRUBINUR, KETONESUR, PROTEINUR, UROBILINOGEN, NITRITE, LEUKOCYTESUR Sepsis Labs: @LABRCNTIP (procalcitonin:4,lacticidven:4)  ) Recent Results (from the past 240 hour(s))  MRSA PCR Screening     Status: None   Collection Time: 12/09/16  4:55 AM  Result Value Ref Range Status   MRSA by PCR NEGATIVE NEGATIVE Final    Comment:        The GeneXpert MRSA Assay (FDA approved for NASAL specimens only), is one component of a comprehensive MRSA colonization surveillance program. It is not intended to diagnose MRSA infection nor to guide or monitor treatment for MRSA infections.          Radiology Studies: No results found.      Scheduled Meds: . aspirin  81 mg Oral Daily  . heparin subcutaneous  5,000 Units Subcutaneous Q8H  . insulin aspart  0-15  Units Subcutaneous TID WC  . insulin aspart  0-5 Units Subcutaneous QHS  . insulin aspart  3 Units Subcutaneous TID WC  . insulin glargine  20 Units Subcutaneous QHS  . isosorbide-hydrALAZINE  1 tablet Oral TID  . mouth rinse  15 mL Mouth Rinse BID  . metoprolol tartrate  50 mg Oral BID  . pravastatin  40 mg Oral Daily  . sodium chloride flush  3 mL Intravenous Q12H   Continuous Infusions:    LOS: 6 days    Time spent: 10 minutes    WOODS, Geraldo Docker, MD [facility] Pager [phone]   If 7PM-7AM, please contact night-coverage [email or web address] Password [email or web address] 12/15/2016, 8:00 AM

## 2016-12-15 NOTE — Progress Notes (Signed)
Patient has some anxiety over having Interventional renal procedure, notified Doctor Florene Glen that patient needed to speak with concerning procedure. Dr. Florene Glen promptly, less than a minute called patient to discuss procedure.

## 2016-12-16 LAB — GLUCOSE, CAPILLARY
GLUCOSE-CAPILLARY: 275 mg/dL — AB (ref 65–99)
Glucose-Capillary: 112 mg/dL — ABNORMAL HIGH (ref 65–99)
Glucose-Capillary: 139 mg/dL — ABNORMAL HIGH (ref 65–99)
Glucose-Capillary: 116 mg/dL — ABNORMAL HIGH (ref 65–99)

## 2016-12-16 LAB — RENAL FUNCTION PANEL
Albumin: 2.9 g/dL — ABNORMAL LOW (ref 3.5–5.0)
Anion gap: 11 (ref 5–15)
BUN: 82 mg/dL — AB (ref 6–20)
CHLORIDE: 107 mmol/L (ref 101–111)
CO2: 15 mmol/L — ABNORMAL LOW (ref 22–32)
Calcium: 9.1 mg/dL (ref 8.9–10.3)
Creatinine, Ser: 6.85 mg/dL — ABNORMAL HIGH (ref 0.44–1.00)
GFR calc Af Amer: 7 mL/min — ABNORMAL LOW (ref >60)
GFR calc non Af Amer: 6 mL/min — ABNORMAL LOW (ref >60)
GLUCOSE: 140 mg/dL — AB (ref 65–99)
POTASSIUM: 5.3 mmol/L — AB (ref 3.5–5.1)
Phosphorus: 7 mg/dL — ABNORMAL HIGH (ref 2.5–4.6)
Sodium: 133 mmol/L — ABNORMAL LOW (ref 135–145)

## 2016-12-16 LAB — PROTIME-INR
INR: 1.03
PROTHROMBIN TIME: 13.6 s (ref 11.4–15.2)

## 2016-12-16 MED ORDER — ONDANSETRON HCL 4 MG/2ML IJ SOLN
4.0000 mg | Freq: Four times a day (QID) | INTRAMUSCULAR | Status: DC | PRN
  Administered 2016-12-25: 4 mg via INTRAVENOUS
  Filled 2016-12-16 (×2): qty 2

## 2016-12-16 NOTE — Progress Notes (Signed)
PT Cancellation Note  Patient Details Name: Phyllis Hernandez MRN: [id number] DOB: 1950/08/04   Cancelled Treatment:    Reason Eval/Treat Not Completed: Fatigue/lethargy limiting ability to participate;Other (comment) (NPO for a renal a stent, declined the walk).  Nursing stated the procedure is not definite, pt will wait until she can eat again to walk.   Ramond Dial 12/16/2016, 3:30 PM   Mee Hives, PT MS Acute Rehab Dept. Number: [place] and [place]

## 2016-12-16 NOTE — Plan of Care (Signed)
Problem: Physical Regulation: Goal: Ability to maintain clinical measurements within normal limits will improve Outcome: Progressing VSS throughout night. Denies any CP or SOB. Currently NPO for MRA and possible IR today. Continue to monitor.

## 2016-12-16 NOTE — Progress Notes (Signed)
Patient ID: Phyllis Hernandez, female   DOB: [birth year], 66 y.o.   MRN: [id number]    Referring Physician(s): Dr. Erling Cruz  Supervising Physician: Jacqulynn Cadet  Patient Status: [facility] - In-pt  Chief Complaint: RAS  Subjective: Patient with no complaints this morning.    Allergies: Patient has no known allergies.  Medications: Prior to Admission medications   Medication Sig Start Date End Date Taking? Authorizing Provider  amLODipine (NORVASC) 10 MG tablet Take 10 mg by mouth daily.   Yes [provider]  aspirin 81 MG chewable tablet Chew 81 mg by mouth daily.   Yes [provider]  ferrous sulfate 325 (65 FE) MG tablet Take 325 mg by mouth daily with breakfast.   Yes [provider]  glipiZIDE (GLUCOTROL) 10 MG tablet Take 10 mg by mouth 2 (two) times daily before a meal.   Yes [provider]  indapamide (LOZOL) 1.25 MG tablet Take 1.25 mg by mouth daily.   Yes [provider]  metFORMIN (GLUCOPHAGE) 1000 MG tablet Take 1,000 mg by mouth 2 (two) times daily with a meal.   Yes [provider]  metoprolol (LOPRESSOR) 50 MG tablet Take 50 mg by mouth 2 (two) times daily.   Yes [provider]  pravastatin (PRAVACHOL) 40 MG tablet Take 40 mg by mouth daily.   Yes [provider]    Vital Signs: BP 136/60 (BP Location: Right Arm)   Pulse 64   Temp 98.5 F (36.9 C) (Oral)   Resp (!) 21   Ht 5\' 5"  (1.651 m)   Wt 223 lb 11.2 oz (101.5 kg)   SpO2 98%   BMI 37.23 kg/m   Physical Exam: Heart: regular Lungs: CTAB Abd: soft, NT, obese  Imaging: No results found.  Labs:  CBC:  Recent Labs  12/09/16 0424 12/10/16 0322 12/11/16 0424 12/14/16 1417  WBC 10.4 9.9 9.5 7.0  HGB 10.9* 9.4* 9.4* 9.4*  HCT 34.4* 30.1* 29.9* 29.1*  PLT 148* 140* 123* 160    COAGS:  Recent Labs  12/15/16 0939 12/16/16 0335  INR 1.01 1.03    BMP:  Recent Labs  12/12/16 0440 12/13/16 0544  12/14/16 1417 12/15/16 0520  NA 132* 131* 129* 131*  K 4.4 4.3 4.6 4.3  CL 103 102 100* 102  CO2 17* 18* 18* 17*  GLUCOSE 148* 174* 408* 157*  BUN 67* 71* 70* 73*  CALCIUM 8.3* 8.5* 8.5* 8.6*  CREATININE 6.51* 6.67* 6.51* 6.63*  GFRNONAA 6* 6* 6* 6*  GFRAA 7* 7* 7* 7*    LIVER FUNCTION TESTS:  Recent Labs  12/09/16 0148 12/11/16 0424 12/12/16 0440 12/13/16 0544 12/14/16 1417  BILITOT 0.6  --   --   --   --   AST 24  --   --   --   --   ALT 21  --   --   --   --   ALKPHOS 73  --   --   --   --   PROT 8.0  --   --   --   --   ALBUMIN 3.6 3.0* 2.6* 2.7* 2.7*    Assessment and Plan: 1. RAS  The MRA has not been able to be completed thus far.  Dr. Laurence Ferrari would like to have this to help with anatomy planning for this procedure, but given it is unclear when this will be done, we will cancel this.  We will plan to proceed today with renal  angiogram and possible stenting if scheduling allows.  She is NPO and all of her labs have been reviewed.  Risks and Benefits discussed with the patient including, but not limited to bleeding, infection, vascular injury or contrast induced renal failure. All of the patient's questions were answered, patient is agreeable to proceed. Consent signed and in chart.  Electronically Signed: Henreitta Cea 12/16/2016, 8:32 AM   I spent a total of 15 Minutes at the the patient's bedside AND on the patient's hospital floor or unit, greater than 50% of which was counseling/coordinating care for RAS

## 2016-12-16 NOTE — Progress Notes (Signed)
Assessment/ Plan:   1. AKI on CKD: due to [facility] emergency.Non-oliguric and plateauing  2. HTNsive emergency: withacute diastolic CHF exac, much improved on minimal meds (atypical); Duplex scan suggest > 60%L RAS and some R RAS.  Given clinical presentation and impending ESRD potential, I think CO2 renal angio worth pursuing.  IR has planned for today. 3. Hyponatremia, improving  Subjective: Interval History: agrees to angio  Objective: Vital signs in last 24 hours: Temp:  [98 F (36.7 C)-98.5 F (36.9 C)] 98.1 F (36.7 C) (06/29 0847) Pulse Rate:  [61-64] 63 (06/29 0847) Resp:  [18-21] 21 (06/29 0551) BP: (123-148)/(55-62) 148/62 (06/29 0847) SpO2:  [98 %-100 %] 100 % (06/29 0847) Weight:  [101.5 kg (223 lb 11.2 oz)] 101.5 kg (223 lb 11.2 oz) (06/29 0551) Weight change: -0.363 kg (-12.8 oz)  Intake/Output from previous day: 06/28 0701 - 06/29 0700 In: 500 [P.O.:500] Out: 2950 [Urine:2950] Intake/Output this shift: No intake/output data recorded.  General appearance: alert and cooperative Resp: clear to auscultation bilaterally Cardio: regular rate and rhythm, S1, S2 normal, no murmur, click, rub or gallop Extremities: edema 1+  Lab Results:  Recent Labs  12/14/16 1417  WBC 7.0  HGB 9.4*  HCT 29.1*  PLT 160   BMET:  Recent Labs  12/14/16 1417 12/15/16 0520  NA 129* 131*  K 4.6 4.3  CL 100* 102  CO2 18* 17*  GLUCOSE 408* 157*  BUN 70* 73*  CREATININE 6.51* 6.63*  CALCIUM 8.5* 8.6*   No results for input(s): PTH in the last 72 hours. Iron Studies: No results for input(s): IRON, TIBC, TRANSFERRIN, FERRITIN in the last 72 hours. Studies/Results: No results found.  Scheduled: . aspirin  81 mg Oral Daily  . heparin subcutaneous  5,000 Units Subcutaneous Q8H  . insulin aspart  0-15 Units Subcutaneous TID WC  . insulin aspart  0-5 Units Subcutaneous QHS  . insulin aspart  3 Units Subcutaneous TID WC  . insulin glargine  20 Units Subcutaneous QHS  .  isosorbide-hydrALAZINE  1 tablet Oral TID  . LORazepam  1 mg Intravenous Once  . mouth rinse  15 mL Mouth Rinse BID  . metoprolol tartrate  50 mg Oral BID  . pravastatin  40 mg Oral Daily  . sodium chloride flush  3 mL Intravenous Q12H    LOS: 7 days   Charlee Squibb C 12/16/2016,10:39 AM

## 2016-12-16 NOTE — Progress Notes (Signed)
PROGRESS NOTE    Phyllis Hernandez  UXN:[id number] DOB: August 05, 1950 DOA: 12/09/2016 PCP: Everardo Beals, NP   Brief Narrative:  Phyllis Hernandez is a 66 y.o. BF PMHx NIDDM and HTN and CKD IV? who presents with acute dyspnea.  The patient was in her usual state of health until a week or so ago when she was talking on the phone, had an episode which she could hardly breathing had to sit down, and the feeling slowly passed by itself.  Since then she has been fine, had noticed a little leg swelling yesterday, but has been taking her BP meds normally, going about her normal business today (went to the store, walked around, did some housework, all without difficulty) then tonight around midnight, she was getting ready for bed and started to feel SOB, sweaty and nervous.  She tried to cough but couldn't cough anything up, and the SOB got worse and worse until she panicked and called 9-1-1.  EMS found her SpO2 70% and BP 250/140 and transported.    Subjective: 6/29  A/O 4, negative CP, negative SOB, negative N/V. States was taking her BP medication appropriately. Not on home O2. Patient refusing to have recommended CO2 renal angiogram. Stating she has changed her mind and will accept recommendation by nephrology to have sCO2 renal angiogram. Scheduled for today      Assessment & Plan:   Principal Problem:   Acute on chronic renal failure (HCC) Active Problems:   Type 2 diabetes mellitus with complication, without long-term current use of insulin (HCC)   Essential hypertension   Hypertensive emergency   Hypertensive emergency  -Off Nitroglycerin gtt -Echocardiogram: Normal see results below -Strict I/Os, since admission - 7.8 L -daily weights Filed Weights   12/14/16 0508 12/15/16 0406 12/16/16 0551  Weight: 224 lb 6.4 oz (101.8 kg) 224 lb 8 oz (101.8 kg) 223 lb 11.2 oz (101.5 kg)  -Bidil 20-30 7.5 mg TID -Metoprolol 50 mg BID  Acute on chronic renal failure (Baseline Cr 2.67  as of Aug 2017)    -Unclear baseline in our records, she suspects baseline Cr is ~4?  Follows with nephrology.  Mild non-gap acidosis.  Lab Results  Component Value Date   CREATININE 6.85 (H) 12/16/2016   CREATININE 6.63 (H) 12/15/2016   CREATININE 6.51 (H) 12/14/2016  -Patient has serious risk of ESRD, nephrology has recommendedCO2 Renal Angiogram. -Patient has reconsidered and will have CO2 Renal Angiogram this afternoon  DM type II   uncontrolled with renal complications -Lantus 20 units daily -NovoLog 3 units QAC -Moderate SSI    DVT prophylaxis: Lovenox Code Status: Full Family Communication: None Disposition Plan: TBD   Consultants:  Nephrology Dr. Erling Cruz    Procedures/Significant Events:  6/23 Echocardiogram: LVEF=  60% to 65%.    VENTILATOR SETTINGS: None   Cultures None  Antimicrobials: Anti-infectives    None       Devices None   LINES / TUBES:  None    Continuous Infusions:    Objective: Vitals:   12/16/16 0551 12/16/16 0847 12/16/16 1232 12/16/16 1730  BP: 136/60 (!) 148/62 (!) 124/52 135/61  Pulse: 64 63    Resp: (!) 21   19  Temp: 98.5 F (36.9 C) 98.1 F (36.7 C) 98.5 F (36.9 C)   TempSrc: Oral Oral Oral   SpO2: 98% 100% 100% 96%  Weight: 223 lb 11.2 oz (101.5 kg)     Height:        Intake/Output Summary (Last  24 hours) at 12/16/16 1931 Last data filed at 12/16/16 1800  Gross per 24 hour  Intake              222 ml  Output             2700 ml  Net            -2478 ml   Filed Weights   12/14/16 0508 12/15/16 0406 12/16/16 0551  Weight: 224 lb 6.4 oz (101.8 kg) 224 lb 8 oz (101.8 kg) 223 lb 11.2 oz (101.5 kg)    Examination:  General: A/O 4, No acute respiratory distress Eyes: negative scleral hemorrhage, negative anisocoria, negative icterus ENT: Negative Runny nose, negative gingival bleeding, Neck:  Negative scars, masses, torticollis, lymphadenopathy, JVD Lungs: Clear to auscultation bilaterally  without wheezes or crackles Cardiovascular: Regular rate and rhythm without murmur gallop or rub normal S1 and S2 Abdomen: negative abdominal pain, nondistended, positive soft, bowel sounds, no rebound, no ascites, no appreciable mass Extremities: No significant cyanosis, clubbing, or edema bilateral lower extremities  .     Data Reviewed: Care during the described time interval was provided by me .  I have reviewed this patient's available data, including medical history, events of note, physical examination, and all test results as part of my evaluation. I have personally reviewed and interpreted all radiology studies.  CBC:  Recent Labs Lab 12/10/16 0322 12/11/16 0424 12/14/16 1417  WBC 9.9 9.5 7.0  NEUTROABS 7.6  --   --   HGB 9.4* 9.4* 9.4*  HCT 30.1* 29.9* 29.1*  MCV 70.3* 69.9* 70.6*  PLT 140* 123* 761   Basic Metabolic Panel:  Recent Labs Lab 12/10/16 0322 12/11/16 0424 12/12/16 0440 12/13/16 0544 12/14/16 1417 12/15/16 0520 12/16/16 1325  NA 133* 132* 132* 131* 129* 131* 133*  K 5.1 4.8 4.4 4.3 4.6 4.3 5.3*  CL 105 104 103 102 100* 102 107  CO2 17* 18* 17* 18* 18* 17* 15*  GLUCOSE 246* 166* 148* 174* 408* 157* 140*  BUN 59* 58* 67* 71* 70* 73* 82*  CREATININE 5.28* 5.83* 6.51* 6.67* 6.51* 6.63* 6.85*  CALCIUM 9.2 9.0 8.3* 8.5* 8.5* 8.6* 9.1  MG 1.5* 2.1  --   --   --   --   --   PHOS  --  4.3 5.5* 5.6* 6.1*  --  7.0*   GFR: Estimated Creatinine Clearance: 9.9 mL/min (A) (by C-G formula based on SCr of 6.85 mg/dL (H)). Liver Function Tests:  Recent Labs Lab 12/11/16 0424 12/12/16 0440 12/13/16 0544 12/14/16 1417 12/16/16 1325  ALBUMIN 3.0* 2.6* 2.7* 2.7* 2.9*   No results for input(s): LIPASE, AMYLASE in the last 168 hours. No results for input(s): AMMONIA in the last 168 hours. Coagulation Profile:  Recent Labs Lab 12/15/16 0939 12/16/16 0335  INR 1.01 1.03   Cardiac Enzymes: No results for input(s): CKTOTAL, CKMB, CKMBINDEX, TROPONINI  in the last 168 hours. BNP (last 3 results) No results for input(s): PROBNP in the last 8760 hours. HbA1C: No results for input(s): HGBA1C in the last 72 hours. CBG:  Recent Labs Lab 12/15/16 1654 12/15/16 2037 12/16/16 0740 12/16/16 1131 12/16/16 1718  GLUCAP 289* 218* 112* 139* 116*   Lipid Profile: No results for input(s): CHOL, HDL, LDLCALC, TRIG, CHOLHDL, LDLDIRECT in the last 72 hours. Thyroid Function Tests: No results for input(s): TSH, T4TOTAL, FREET4, T3FREE, THYROIDAB in the last 72 hours. Anemia Panel: No results for input(s): VITAMINB12, FOLATE, FERRITIN, TIBC, IRON, RETICCTPCT  in the last 72 hours. Urine analysis: No results found for: COLORURINE, APPEARANCEUR, LABSPEC, PHURINE, GLUCOSEU, HGBUR, BILIRUBINUR, KETONESUR, PROTEINUR, UROBILINOGEN, NITRITE, LEUKOCYTESUR Sepsis Labs: @LABRCNTIP (procalcitonin:4,lacticidven:4)  ) Recent Results (from the past 240 hour(s))  MRSA PCR Screening     Status: None   Collection Time: 12/09/16  4:55 AM  Result Value Ref Range Status   MRSA by PCR NEGATIVE NEGATIVE Final    Comment:        The GeneXpert MRSA Assay (FDA approved for NASAL specimens only), is one component of a comprehensive MRSA colonization surveillance program. It is not intended to diagnose MRSA infection nor to guide or monitor treatment for MRSA infections.          Radiology Studies: No results found.      Scheduled Meds: . aspirin  81 mg Oral Daily  . heparin subcutaneous  5,000 Units Subcutaneous Q8H  . insulin aspart  0-15 Units Subcutaneous TID WC  . insulin aspart  0-5 Units Subcutaneous QHS  . insulin aspart  3 Units Subcutaneous TID WC  . insulin glargine  20 Units Subcutaneous QHS  . isosorbide-hydrALAZINE  1 tablet Oral TID  . LORazepam  1 mg Intravenous Once  . mouth rinse  15 mL Mouth Rinse BID  . metoprolol tartrate  50 mg Oral BID  . pravastatin  40 mg Oral Daily  . sodium chloride flush  3 mL Intravenous Q12H    Continuous Infusions:    LOS: 7 days    Time spent: 10 minutes    Jacqulyn Barresi, Geraldo Docker, MD [facility] Pager [phone]   If 7PM-7AM, please contact night-coverage [email or web address] Password [facility] 12/16/2016, 7:31 PM

## 2016-12-17 DIAGNOSIS — I701 Atherosclerosis of renal artery: Secondary | ICD-10-CM

## 2016-12-17 LAB — BASIC METABOLIC PANEL
Anion gap: 12 (ref 5–15)
BUN: 82 mg/dL — AB (ref 6–20)
CALCIUM: 9 mg/dL (ref 8.9–10.3)
CHLORIDE: 106 mmol/L (ref 101–111)
CO2: 18 mmol/L — ABNORMAL LOW (ref 22–32)
CREATININE: 6.66 mg/dL — AB (ref 0.44–1.00)
GFR calc Af Amer: 7 mL/min — ABNORMAL LOW (ref >60)
GFR calc non Af Amer: 6 mL/min — ABNORMAL LOW (ref >60)
Glucose, Bld: 128 mg/dL — ABNORMAL HIGH (ref 65–99)
Potassium: 4.2 mmol/L (ref 3.5–5.1)
SODIUM: 136 mmol/L (ref 135–145)

## 2016-12-17 LAB — MAGNESIUM: MAGNESIUM: 2.3 mg/dL (ref 1.7–2.4)

## 2016-12-17 LAB — GLUCOSE, CAPILLARY
GLUCOSE-CAPILLARY: 256 mg/dL — AB (ref 65–99)
GLUCOSE-CAPILLARY: 208 mg/dL — AB (ref 65–99)
Glucose-Capillary: 115 mg/dL — ABNORMAL HIGH (ref 65–99)
Glucose-Capillary: 232 mg/dL — ABNORMAL HIGH (ref 65–99)
Glucose-Capillary: 303 mg/dL — ABNORMAL HIGH (ref 65–99)

## 2016-12-17 MED ORDER — CALCIUM ACETATE (PHOS BINDER) 667 MG PO CAPS
1334.0000 mg | ORAL_CAPSULE | Freq: Three times a day (TID) | ORAL | Status: DC
  Administered 2016-12-17 – 2016-12-28 (×27): 1334 mg via ORAL
  Filled 2016-12-17 (×28): qty 2

## 2016-12-17 NOTE — Progress Notes (Signed)
Assessment/ Plan:   1. AKI on CKD: due to [facility] emergency.Non-oliguric and plateauing  2. HTNsive emergency: withacute diastolic CHF exac, much improved on minimal meds (atypical); Duplex scan suggest > 60%L RAS and some R RAS. Given clinical presentation and impending ESRD potential, I think CO2 renal angio worth pursuing. IR has planned for planned for MRA first. 3. Hyponatremia, improving   Subjective: Interval History: Frustrated by plans.  Objective: Vital signs in last 24 hours: Temp:  [98.1 F (36.7 C)-99.1 F (37.3 C)] 98.3 F (36.8 C) (06/30 1216) Pulse Rate:  [58-70] 68 (06/30 1216) Resp:  [15-19] 19 (06/30 1216) BP: (135-176)/(53-69) 176/63 (06/30 1216) SpO2:  [96 %-100 %] 98 % (06/30 1216) Weight change:   Intake/Output from previous day: 06/29 0701 - 06/30 0700 In: 222 [P.O.:222] Out: 2200 [Urine:2200] Intake/Output this shift: Total I/O In: 480 [P.O.:480] Out: 650 [Urine:650]  General appearance: alert and cooperative Back: symmetric, no curvature. ROM normal. No CVA tenderness. Resp: clear to auscultation bilaterally Cardio: regular rate and rhythm, S1, S2 normal, no murmur, click, rub or gallop Extremities: extremities normal, atraumatic, no cyanosis or edema  Lab Results: No results for input(s): WBC, HGB, HCT, PLT in the last 72 hours. BMET:  Recent Labs  12/16/16 1325 12/17/16 0825  NA 133* 136  K 5.3* 4.2  CL 107 106  CO2 15* 18*  GLUCOSE 140* 128*  BUN 82* 82*  CREATININE 6.85* 6.66*  CALCIUM 9.1 9.0   No results for input(s): PTH in the last 72 hours. Iron Studies: No results for input(s): IRON, TIBC, TRANSFERRIN, FERRITIN in the last 72 hours. Studies/Results: No results found.  Scheduled: . aspirin  81 mg Oral Daily  . heparin subcutaneous  5,000 Units Subcutaneous Q8H  . insulin aspart  0-15 Units Subcutaneous TID WC  . insulin aspart  0-5 Units Subcutaneous QHS  . insulin aspart  3 Units Subcutaneous TID WC  . insulin  glargine  20 Units Subcutaneous QHS  . isosorbide-hydrALAZINE  1 tablet Oral TID  . LORazepam  1 mg Intravenous Once  . mouth rinse  15 mL Mouth Rinse BID  . metoprolol tartrate  50 mg Oral BID  . pravastatin  40 mg Oral Daily  . sodium chloride flush  3 mL Intravenous Q12H    LOS: 8 days   Paxten Appelt C 12/17/2016,2:22 PM

## 2016-12-17 NOTE — Progress Notes (Signed)
Patient ID: Phyllis Hernandez, female   DOB: 1951-04-27, 66 y.o.   MRN: [id number]    Referring Physician(s): Dr. Erling Cruz  Supervising Physician: Markus Daft  Patient Status: [facility] - In-pt  Chief Complaint: HTN/RAS  Subjective: Patient has no complaints.  She would like to eat.  Allergies: Patient has no known allergies.  Medications: Prior to Admission medications   Medication Sig Start Date End Date Taking? Authorizing Provider  amLODipine (NORVASC) 10 MG tablet Take 10 mg by mouth daily.   Yes [provider]  aspirin 81 MG chewable tablet Chew 81 mg by mouth daily.   Yes [provider]  ferrous sulfate 325 (65 FE) MG tablet Take 325 mg by mouth daily with breakfast.   Yes [provider]  glipiZIDE (GLUCOTROL) 10 MG tablet Take 10 mg by mouth 2 (two) times daily before a meal.   Yes [provider]  indapamide (LOZOL) 1.25 MG tablet Take 1.25 mg by mouth daily.   Yes [provider]  metFORMIN (GLUCOPHAGE) 1000 MG tablet Take 1,000 mg by mouth 2 (two) times daily with a meal.   Yes [provider]  metoprolol (LOPRESSOR) 50 MG tablet Take 50 mg by mouth 2 (two) times daily.   Yes [provider]  pravastatin (PRAVACHOL) 40 MG tablet Take 40 mg by mouth daily.   Yes [provider]    Vital Signs: BP (!) 169/69   Pulse 69   Temp 98.4 F (36.9 C) (Oral)   Resp 15   Ht 5\' 5"  (1.651 m)   Wt 223 lb 11.2 oz (101.5 kg)   SpO2 98%   BMI 37.23 kg/m   Physical Exam: Heart: RRR Lungs: CTAB  Imaging: No results found.  Labs:  CBC:  Recent Labs  12/09/16 0424 12/10/16 0322 12/11/16 0424 12/14/16 1417  WBC 10.4 9.9 9.5 7.0  HGB 10.9* 9.4* 9.4* 9.4*  HCT 34.4* 30.1* 29.9* 29.1*  PLT 148* 140* 123* 160    COAGS:  Recent Labs  12/15/16 0939 12/16/16 0335  INR 1.01 1.03    BMP:  Recent Labs  12/14/16 1417 12/15/16 0520 12/16/16 1325 12/17/16 0825  NA 129* 131* 133* 136  K  4.6 4.3 5.3* 4.2  CL 100* 102 107 106  CO2 18* 17* 15* 18*  GLUCOSE 408* 157* 140* 128*  BUN 70* 73* 82* 82*  CALCIUM 8.5* 8.6* 9.1 9.0  CREATININE 6.51* 6.63* 6.85* 6.66*  GFRNONAA 6* 6* 6* 6*  GFRAA 7* 7* 7* 7*    LIVER FUNCTION TESTS:  Recent Labs  12/09/16 0148  12/12/16 0440 12/13/16 0544 12/14/16 1417 12/16/16 1325  BILITOT 0.6  --   --   --   --   --   AST 24  --   --   --   --   --   ALT 21  --   --   --   --   --   ALKPHOS 73  --   --   --   --   --   PROT 8.0  --   --   --   --   --   ALBUMIN 3.6  < > 2.6* 2.7* 2.7* 2.9*  < > = values in this interval not displayed.  Assessment and Plan: 1. Left-sided RAS, HTN emergency on admission  Dr. Anselm Pancoast has spoken to Dr. Jonnie Finner today.  This is not felt to be an emergency that needs to be done over the  weekend as she is currently well managed medically as her BP is controlled.  Given this isn't going to be done this weekend, we would like to once again ordered an MRA of her abdomen to evaluate her anatomy.  The patient has been made aware and understands.  Electronically Signed: Henreitta Cea 12/17/2016, 9:44 AM   I spent a total of 15 Minutes at the the patient's bedside AND on the patient's hospital floor or unit, greater than 50% of which was counseling/coordinating care for HTN, RAS

## 2016-12-17 NOTE — Progress Notes (Signed)
PROGRESS NOTE    Phyllis Hernandez  DZH:[id number] DOB: 1951-05-23 DOA: 12/09/2016 PCP: Everardo Beals, NP   Brief Narrative:  Phyllis Hernandez is a 66 y.o. BF PMHx NIDDM and HTN and CKD IV? who presents with acute dyspnea.  The patient was in her usual state of health until a week or so ago when she was talking on the phone, had an episode which she could hardly breathing had to sit down, and the feeling slowly passed by itself.  Since then she has been fine, had noticed a little leg swelling yesterday, but has been taking her BP meds normally, going about her normal business today (went to the store, walked around, did some housework, all without difficulty) then tonight around midnight, she was getting ready for bed and started to feel SOB, sweaty and nervous.  She tried to cough but couldn't cough anything up, and the SOB got worse and worse until she panicked and called 9-1-1.  EMS found her SpO2 70% and BP 250/140 and transported.    Subjective: 6/30  A/O 4, negative CP, negative SOB, negative N/V. States was taking her BP medication appropriately. Not on home O2. Nephrology has delayed CO2 renal angiogram, and will obtain abdominal MRA first. Patient frustrated with change plans      Assessment & Plan:   Principal Problem:   Acute on chronic renal failure (HCC) Active Problems:   Type 2 diabetes mellitus with complication, without long-term current use of insulin (HCC)   Essential hypertension   Hypertensive emergency   Hypertensive emergency  -Off Nitroglycerin gtt -Echocardiogram: Normal see results below -Strict I/Os, since admission - 8.7 L -daily weights Filed Weights   12/14/16 0508 12/15/16 0406 12/16/16 0551  Weight: 224 lb 6.4 oz (101.8 kg) 224 lb 8 oz (101.8 kg) 223 lb 11.2 oz (101.5 kg)  -Bidil 20-30 7.5 mg TID -Metoprolol 50 mg BID  Acute on chronic renal failure (Baseline Cr 2.67 as of Aug 2017)    -Unclear baseline in our records, she suspects  baseline Cr is ~4?  Follows with nephrology.  Mild non-gap acidosis. Lab Results  Component Value Date   CREATININE 6.66 (H) 12/17/2016   CREATININE 6.85 (H) 12/16/2016   CREATININE 6.63 (H) 12/15/2016  -Patient has serious risk of ESRD, nephrology has recommendedCO2 Renal Angiogram. -Patient has reconsidered and will have CO2 Renal Angiogram. Unfortunately nephrology has postponed study until they obtain abdomen MRA  DM type II   uncontrolled with renal complications -Lantus 20 units daily -NovoLog 3 units QAC -Moderate SSI    DVT prophylaxis: Lovenox Code Status: Full Family Communication: None Disposition Plan: TBD   Consultants:  Nephrology Dr. Erling Cruz    Procedures/Significant Events:  6/23 Echocardiogram: LVEF=  60% to 65%.    VENTILATOR SETTINGS: None   Cultures None  Antimicrobials: Anti-infectives    None       Devices None   LINES / TUBES:  None    Continuous Infusions:    Objective: Vitals:   12/16/16 1730 12/16/16 1953 12/17/16 0043 12/17/16 0451  BP: 135/61 (!) 149/69 (!) 141/53 140/61  Pulse:  70 (!) 58 62  Resp: 19 18 18 15   Temp:  98.1 F (36.7 C) 99.1 F (37.3 C) 98.4 F (36.9 C)  TempSrc:  Oral Oral Oral  SpO2: 96% 100% 99% 98%  Weight:      Height:        Intake/Output Summary (Last 24 hours) at 12/17/16 0756 Last data filed at  12/17/16 0300  Gross per 24 hour  Intake              222 ml  Output             2200 ml  Net            -1978 ml   Filed Weights   12/14/16 0508 12/15/16 0406 12/16/16 0551  Weight: 224 lb 6.4 oz (101.8 kg) 224 lb 8 oz (101.8 kg) 223 lb 11.2 oz (101.5 kg)    Examination:  General: A/O 4, No acute respiratory distress Eyes: negative scleral hemorrhage, negative anisocoria, negative icterus ENT: Negative Runny nose, negative gingival bleeding, Neck:  Negative scars, masses, torticollis, lymphadenopathy, JVD Lungs: Clear to auscultation bilaterally without wheezes or  crackles Cardiovascular: Regular rate and rhythm without murmur gallop or rub normal S1 and S2 Abdomen: negative abdominal pain, nondistended, positive soft, bowel sounds, no rebound, no ascites, no appreciable mass Extremities: No significant cyanosis, clubbing, or edema bilateral lower extremities  .     Data Reviewed: Care during the described time interval was provided by me .  I have reviewed this patient's available data, including medical history, events of note, physical examination, and all test results as part of my evaluation. I have personally reviewed and interpreted all radiology studies.  CBC:  Recent Labs Lab 12/11/16 0424 12/14/16 1417  WBC 9.5 7.0  HGB 9.4* 9.4*  HCT 29.9* 29.1*  MCV 69.9* 70.6*  PLT 123* 270   Basic Metabolic Panel:  Recent Labs Lab 12/11/16 0424 12/12/16 0440 12/13/16 0544 12/14/16 1417 12/15/16 0520 12/16/16 1325  NA 132* 132* 131* 129* 131* 133*  K 4.8 4.4 4.3 4.6 4.3 5.3*  CL 104 103 102 100* 102 107  CO2 18* 17* 18* 18* 17* 15*  GLUCOSE 166* 148* 174* 408* 157* 140*  BUN 58* 67* 71* 70* 73* 82*  CREATININE 5.83* 6.51* 6.67* 6.51* 6.63* 6.85*  CALCIUM 9.0 8.3* 8.5* 8.5* 8.6* 9.1  MG 2.1  --   --   --   --   --   PHOS 4.3 5.5* 5.6* 6.1*  --  7.0*   GFR: Estimated Creatinine Clearance: 9.9 mL/min (A) (by C-G formula based on SCr of 6.85 mg/dL (H)). Liver Function Tests:  Recent Labs Lab 12/11/16 0424 12/12/16 0440 12/13/16 0544 12/14/16 1417 12/16/16 1325  ALBUMIN 3.0* 2.6* 2.7* 2.7* 2.9*   No results for input(s): LIPASE, AMYLASE in the last 168 hours. No results for input(s): AMMONIA in the last 168 hours. Coagulation Profile:  Recent Labs Lab 12/15/16 0939 12/16/16 0335  INR 1.01 1.03   Cardiac Enzymes: No results for input(s): CKTOTAL, CKMB, CKMBINDEX, TROPONINI in the last 168 hours. BNP (last 3 results) No results for input(s): PROBNP in the last 8760 hours. HbA1C: No results for input(s): HGBA1C in  the last 72 hours. CBG:  Recent Labs Lab 12/15/16 2037 12/16/16 0740 12/16/16 1131 12/16/16 1718 12/16/16 2148  GLUCAP 218* 112* 139* 116* 275*   Lipid Profile: No results for input(s): CHOL, HDL, LDLCALC, TRIG, CHOLHDL, LDLDIRECT in the last 72 hours. Thyroid Function Tests: No results for input(s): TSH, T4TOTAL, FREET4, T3FREE, THYROIDAB in the last 72 hours. Anemia Panel: No results for input(s): VITAMINB12, FOLATE, FERRITIN, TIBC, IRON, RETICCTPCT in the last 72 hours. Urine analysis: No results found for: COLORURINE, APPEARANCEUR, LABSPEC, PHURINE, GLUCOSEU, HGBUR, BILIRUBINUR, KETONESUR, PROTEINUR, UROBILINOGEN, NITRITE, LEUKOCYTESUR Sepsis Labs: @LABRCNTIP (procalcitonin:4,lacticidven:4)  ) Recent Results (from the past 240 hour(s))  MRSA PCR Screening  Status: None   Collection Time: 12/09/16  4:55 AM  Result Value Ref Range Status   MRSA by PCR NEGATIVE NEGATIVE Final    Comment:        The GeneXpert MRSA Assay (FDA approved for NASAL specimens only), is one component of a comprehensive MRSA colonization surveillance program. It is not intended to diagnose MRSA infection nor to guide or monitor treatment for MRSA infections.          Radiology Studies: No results found.      Scheduled Meds: . aspirin  81 mg Oral Daily  . heparin subcutaneous  5,000 Units Subcutaneous Q8H  . insulin aspart  0-15 Units Subcutaneous TID WC  . insulin aspart  0-5 Units Subcutaneous QHS  . insulin aspart  3 Units Subcutaneous TID WC  . insulin glargine  20 Units Subcutaneous QHS  . isosorbide-hydrALAZINE  1 tablet Oral TID  . LORazepam  1 mg Intravenous Once  . mouth rinse  15 mL Mouth Rinse BID  . metoprolol tartrate  50 mg Oral BID  . pravastatin  40 mg Oral Daily  . sodium chloride flush  3 mL Intravenous Q12H   Continuous Infusions:    LOS: 8 days    Time spent: 10 minutes    WOODS, Geraldo Docker, MD [facility] Pager [phone]   If  7PM-7AM, please contact night-coverage [email or web address] Password [email or web address] 12/17/2016, 7:56 AM

## 2016-12-18 ENCOUNTER — Inpatient Hospital Stay (HOSPITAL_COMMUNITY): Payer: Medicaid Other

## 2016-12-18 LAB — GLUCOSE, CAPILLARY
GLUCOSE-CAPILLARY: 172 mg/dL — AB (ref 65–99)
GLUCOSE-CAPILLARY: 168 mg/dL — AB (ref 65–99)
GLUCOSE-CAPILLARY: 198 mg/dL — AB (ref 65–99)
Glucose-Capillary: 233 mg/dL — ABNORMAL HIGH (ref 65–99)

## 2016-12-18 MED ORDER — INSULIN ASPART 100 UNIT/ML ~~LOC~~ SOLN
8.0000 [IU] | Freq: Three times a day (TID) | SUBCUTANEOUS | Status: DC
  Administered 2016-12-19 – 2016-12-28 (×25): 8 [IU] via SUBCUTANEOUS

## 2016-12-18 MED ORDER — ISOSORB DINITRATE-HYDRALAZINE 20-37.5 MG PO TABS
2.0000 | ORAL_TABLET | Freq: Three times a day (TID) | ORAL | Status: DC
  Administered 2016-12-18 – 2016-12-27 (×28): 2 via ORAL
  Filled 2016-12-18 (×31): qty 2

## 2016-12-18 MED ORDER — INSULIN GLARGINE 100 UNIT/ML ~~LOC~~ SOLN
22.0000 [IU] | Freq: Every day | SUBCUTANEOUS | Status: DC
  Administered 2016-12-18 – 2016-12-19 (×2): 22 [IU] via SUBCUTANEOUS
  Filled 2016-12-18 (×4): qty 0.22

## 2016-12-18 NOTE — Progress Notes (Signed)
PROGRESS NOTE    ANGELLY SPEARING  ZJI:[id number] DOB: [birth year] DOA: 12/09/2016 PCP: Everardo Beals, NP   Brief Narrative:  Phyllis Hernandez is a 66 y.o. BF PMHx NIDDM and HTN and CKD IV? who presents with acute dyspnea.  The patient was in her usual state of health until a week or so ago when she was talking on the phone, had an episode which she could hardly breathing had to sit down, and the feeling slowly passed by itself.  Since then she has been fine, had noticed a little leg swelling yesterday, but has been taking her BP meds normally, going about her normal business today (went to the store, walked around, did some housework, all without difficulty) then tonight around midnight, she was getting ready for bed and started to feel SOB, sweaty and nervous.  She tried to cough but couldn't cough anything up, and the SOB got worse and worse until she panicked and called 9-1-1.  EMS found her SpO2 70% and BP 250/140 and transported.    Subjective: 7/1   A/O 4, negative CP, negative SOB, negative N/V. States was taking her BP medication appropriately. Not on home O2. Abdominal MRA obtained showed no significant renal artery stenosis.     Assessment & Plan:   Principal Problem:   Acute on chronic renal failure (HCC) Active Problems:   Type 2 diabetes mellitus with complication, without long-term current use of insulin (HCC)   Essential hypertension   Hypertensive emergency   Hypertensive emergency  -Off Nitroglycerin gtt -Echocardiogram: Normal see results below -Strict I/Os, since admission - 9.6 L -daily weights Filed Weights   12/15/16 0406 12/16/16 0551 12/18/16 0653  Weight: 224 lb 8 oz (101.8 kg) 223 lb 11.2 oz (101.5 kg) 222 lb 9.6 oz (101 kg)  -7/1 increased Bidil 20-30 7.5 mg  (2 tablets )TID -Metoprolol 50 mg BID  Acute on chronic renal failure (Baseline Cr 2.67 as of Aug 2017)    -Unclear baseline in our records, she suspects baseline Cr is ~4?  Follows  with nephrology.  Mild non-gap acidosis. Lab Results  Component Value Date   CREATININE 6.66 (H) 12/17/2016   CREATININE 6.85 (H) 12/16/2016   CREATININE 6.63 (H) 12/15/2016  -Patient has serious risk of ESRD, nephrology has recommendedCO2 Renal Angiogram. -Abdomen MRA shows no significant renal artery stenosis see results below -Per Nephrologist he may discharge in 24-48 hours with close follow-up  DM type II   uncontrolled with renal complications -7/1 increased Lantus 22 units daily -7/1 increased NovoLog 8 units QAC -Moderate SSI    DVT prophylaxis: Lovenox Code Status: Full Family Communication: None Disposition Plan: TBD   Consultants:  Nephrology Dr. Erling Cruz    Procedures/Significant Events:  6/23 Echocardiogram: LVEF=  60% to 65%. 7/1 MRI abdomen WO contrast: -Multiple renal arteries. No significant stenosis involving the bilateral main renal arteries.  -There may be a stenosis involving the accessory left renal artery which could account for the abnormality on the renal duplex.   VENTILATOR SETTINGS: None   Cultures None  Antimicrobials: Anti-infectives    None       Devices None   LINES / TUBES:  None    Continuous Infusions:    Objective: Vitals:   12/17/16 2105 12/18/16 0054 12/18/16 0515 12/18/16 0653  BP: (!) 144/53 (!) 161/75 (!) 150/61   Pulse:   (!) 59   Resp: 17 (!) 22 15   Temp: 98.4 F (36.9 C) 98.2 F (36.8 C)  98.1 F (36.7 C)   TempSrc: Oral Oral Oral   SpO2: 98% 99% 98%   Weight:    222 lb 9.6 oz (101 kg)  Height:        Intake/Output Summary (Last 24 hours) at 12/18/16 0744 Last data filed at 12/18/16 0654  Gross per 24 hour  Intake              840 ml  Output             1950 ml  Net            -1110 ml   Filed Weights   12/15/16 0406 12/16/16 0551 12/18/16 0653  Weight: 224 lb 8 oz (101.8 kg) 223 lb 11.2 oz (101.5 kg) 222 lb 9.6 oz (101 kg)    Examination:  General: A/O 4, No acute  respiratory distress Eyes: negative scleral hemorrhage, negative anisocoria, negative icterus ENT: Negative Runny nose, negative gingival bleeding, Neck:  Negative scars, masses, torticollis, lymphadenopathy, JVD Lungs: Clear to auscultation bilaterally without wheezes or crackles Cardiovascular: Regular rate and rhythm without murmur gallop or rub normal S1 and S2 Abdomen: negative abdominal pain, nondistended, positive soft, bowel sounds, no rebound, no ascites, no appreciable mass Extremities: No significant cyanosis, clubbing, or edema bilateral lower extremities  .     Data Reviewed: Care during the described time interval was provided by me .  I have reviewed this patient's available data, including medical history, events of note, physical examination, and all test results as part of my evaluation. I have personally reviewed and interpreted all radiology studies.  CBC:  Recent Labs Lab 12/14/16 1417  WBC 7.0  HGB 9.4*  HCT 29.1*  MCV 70.6*  PLT 161   Basic Metabolic Panel:  Recent Labs Lab 12/12/16 0440 12/13/16 0544 12/14/16 1417 12/15/16 0520 12/16/16 1325 12/17/16 0825  NA 132* 131* 129* 131* 133* 136  K 4.4 4.3 4.6 4.3 5.3* 4.2  CL 103 102 100* 102 107 106  CO2 17* 18* 18* 17* 15* 18*  GLUCOSE 148* 174* 408* 157* 140* 128*  BUN 67* 71* 70* 73* 82* 82*  CREATININE 6.51* 6.67* 6.51* 6.63* 6.85* 6.66*  CALCIUM 8.3* 8.5* 8.5* 8.6* 9.1 9.0  MG  --   --   --   --   --  2.3  PHOS 5.5* 5.6* 6.1*  --  7.0*  --    GFR: Estimated Creatinine Clearance: 10.2 mL/min (A) (by C-G formula based on SCr of 6.66 mg/dL (H)). Liver Function Tests:  Recent Labs Lab 12/12/16 0440 12/13/16 0544 12/14/16 1417 12/16/16 1325  ALBUMIN 2.6* 2.7* 2.7* 2.9*   No results for input(s): LIPASE, AMYLASE in the last 168 hours. No results for input(s): AMMONIA in the last 168 hours. Coagulation Profile:  Recent Labs Lab 12/15/16 0939 12/16/16 0335  INR 1.01 1.03   Cardiac  Enzymes: No results for input(s): CKTOTAL, CKMB, CKMBINDEX, TROPONINI in the last 168 hours. BNP (last 3 results) No results for input(s): PROBNP in the last 8760 hours. HbA1C: No results for input(s): HGBA1C in the last 72 hours. CBG:  Recent Labs Lab 12/17/16 0815 12/17/16 1215 12/17/16 1641 12/17/16 1732 12/17/16 2120  GLUCAP 115* 232* 303* 256* 208*   Lipid Profile: No results for input(s): CHOL, HDL, LDLCALC, TRIG, CHOLHDL, LDLDIRECT in the last 72 hours. Thyroid Function Tests: No results for input(s): TSH, T4TOTAL, FREET4, T3FREE, THYROIDAB in the last 72 hours. Anemia Panel: No results for input(s): VITAMINB12, FOLATE, FERRITIN, TIBC,  IRON, RETICCTPCT in the last 72 hours. Urine analysis: No results found for: COLORURINE, APPEARANCEUR, LABSPEC, PHURINE, GLUCOSEU, HGBUR, BILIRUBINUR, KETONESUR, PROTEINUR, UROBILINOGEN, NITRITE, LEUKOCYTESUR Sepsis Labs: @LABRCNTIP (procalcitonin:4,lacticidven:4)  ) Recent Results (from the past 240 hour(s))  MRSA PCR Screening     Status: None   Collection Time: 12/09/16  4:55 AM  Result Value Ref Range Status   MRSA by PCR NEGATIVE NEGATIVE Final    Comment:        The GeneXpert MRSA Assay (FDA approved for NASAL specimens only), is one component of a comprehensive MRSA colonization surveillance program. It is not intended to diagnose MRSA infection nor to guide or monitor treatment for MRSA infections.          Radiology Studies: No results found.      Scheduled Meds: . aspirin  81 mg Oral Daily  . calcium acetate  1,334 mg Oral TID WC  . heparin subcutaneous  5,000 Units Subcutaneous Q8H  . insulin aspart  0-15 Units Subcutaneous TID WC  . insulin aspart  0-5 Units Subcutaneous QHS  . insulin aspart  3 Units Subcutaneous TID WC  . insulin glargine  20 Units Subcutaneous QHS  . isosorbide-hydrALAZINE  1 tablet Oral TID  . LORazepam  1 mg Intravenous Once  . mouth rinse  15 mL Mouth Rinse BID  . metoprolol  tartrate  50 mg Oral BID  . pravastatin  40 mg Oral Daily  . sodium chloride flush  3 mL Intravenous Q12H   Continuous Infusions:    LOS: 9 days    Time spent: 10 minutes    Mattelyn Imhoff, Geraldo Docker, MD [facility] Pager [phone]   If 7PM-7AM, please contact night-coverage [email or web address] Password [facility] 12/18/2016, 7:44 AM

## 2016-12-18 NOTE — Progress Notes (Signed)
Patient underwent her MRA yesterday to evaluate her renal arteries prior to pursuing a renal angiogram.  She was actually found to have 5 renal arteries, 3 on the right and 2 on the left.  Both main renal arteries show no significant evidence of stenosis.  The accessory vessel on the left does show some stenosis which is likely what was seen on the duplex.    "IMPRESSION: Multiple renal arteries. No significant stenosis involving the bilateral main renal arteries. There may be a stenosis involving the accessory left renal artery which could account for the abnormality on the renal duplex."  Because of these findings, a renal angiogram with intervention for revascularization is not warranted.  No plans for renal angiogram.  This will be discussed with the patient.  Phyllis Hernandez E 8:30 AM 12/18/2016

## 2016-12-18 NOTE — Progress Notes (Signed)
Assessment/ Plan:   1. AKI on CKD: due to [facility] emergency.Non-oliguric and plateauing  2. HTNsive emergency: withacute diastolic CHF exac, much improved on minimal meds (atypical);MRI does not support duplex findings(see below) 3. Hyponatremia, improving  Plan: I think if renal fct stable or improving pt can be released for OP f/u in next day or so.  I d/w her eventual ned for dialysis, access placement and perhaps transplantation.  She reports, " I'm not claiming that"  Subjective: Interval History: Feels ok  Objective: Vital signs in last 24 hours: Temp:  [98 F (36.7 C)-98.4 F (36.9 C)] 98.1 F (36.7 C) (07/01 0515) Pulse Rate:  [59-68] 65 (07/01 0811) Resp:  [15-22] 15 (07/01 0515) BP: (144-176)/(53-75) 151/70 (07/01 0811) SpO2:  [96 %-99 %] 98 % (07/01 0515) Weight:  [101 kg (222 lb 9.6 oz)] 101 kg (222 lb 9.6 oz) (07/01 0653) Weight change:   Intake/Output from previous day: 06/30 0701 - 07/01 0700 In: 840 [P.O.:840] Out: 1950 [Urine:1950] Intake/Output this shift: No intake/output data recorded.  PE Alert Lungs clear Cor RRR Abd soft, obese Ext No CCE  Lab Results: No results for input(s): WBC, HGB, HCT, PLT in the last 72 hours. BMET:  Recent Labs  12/16/16 1325 12/17/16 0825  NA 133* 136  K 5.3* 4.2  CL 107 106  CO2 15* 18*  GLUCOSE 140* 128*  BUN 82* 82*  CREATININE 6.85* 6.66*  CALCIUM 9.1 9.0   No results for input(s): PTH in the last 72 hours. Iron Studies: No results for input(s): IRON, TIBC, TRANSFERRIN, FERRITIN in the last 72 hours. Studies/Results: Mr Jodene Nam Abdomen Wo Contrast  Result Date: 12/18/2016 CLINICAL DATA:  66 year old with hypertension and worsening renal function. Concern for left renal artery stenosis based on renal artery duplex. EXAM: MRA ABDOMEN WITH CONTRAST TECHNIQUE: Multiplanar, multiecho pulse sequences of the abdomen and pelvis were obtained with intravenous contrast. Angiographic images of abdomen and pelvis were  obtained using MRA technique with intravenous contrast. CONTRAST:  None COMPARISON:  Renal artery duplex report 12/14/2016 FINDINGS: Vascular structures This is a high quality noncontrast examination. Normal caliber of the abdominal aorta. Celiac trunk and main branch vessels are patent. SMA is patent. Proximal IMA is patent. Normal caliber of the proximal common iliac arteries. There are multiple renal arteries. Evidence for at least three right renal arteries. The main right renal artery is patent without significant narrowing. Main right renal artery is at the 10 o'clock position on the aorta. There is an accessory inferior and superior right renal artery. Evidence for two left renal arteries. The main left renal artery appears to be widely patent. There accessory left renal artery is just caudal to the main left renal artery. There appears to be a stenosis involving this accessory left renal artery. Main left renal artery is at the 4 o'clock position and the accessory left renal artery is at the 2 o'clock position. Nonvascular structures No gross abnormality to the liver or gallbladder. Normal appearance of the pancreas and spleen. Normal appearance of the kidneys without hydronephrosis. Adrenals appear to be normal. No large pleural effusions or significant ascites. IMPRESSION: Multiple renal arteries. No significant stenosis involving the bilateral main renal arteries. There may be a stenosis involving the accessory left renal artery which could account for the abnormality on the renal duplex. Electronically Signed   By: Markus Daft M.D.   On: 12/18/2016 08:07    Scheduled: . aspirin  81 mg Oral Daily  . calcium acetate  1,334 mg Oral TID WC  . heparin subcutaneous  5,000 Units Subcutaneous Q8H  . insulin aspart  0-15 Units Subcutaneous TID WC  . insulin aspart  0-5 Units Subcutaneous QHS  . insulin aspart  3 Units Subcutaneous TID WC  . insulin glargine  20 Units Subcutaneous QHS  .  isosorbide-hydrALAZINE  1 tablet Oral TID  . LORazepam  1 mg Intravenous Once  . mouth rinse  15 mL Mouth Rinse BID  . metoprolol tartrate  50 mg Oral BID  . pravastatin  40 mg Oral Daily  . sodium chloride flush  3 mL Intravenous Q12H   Continuous:   LOS: 9 days   Ronella Plunk C 12/18/2016,9:54 AM

## 2016-12-19 LAB — BASIC METABOLIC PANEL
Anion gap: 11 (ref 5–15)
BUN: 85 mg/dL — AB (ref 6–20)
CHLORIDE: 105 mmol/L (ref 101–111)
CO2: 17 mmol/L — AB (ref 22–32)
CREATININE: 6.57 mg/dL — AB (ref 0.44–1.00)
Calcium: 9 mg/dL (ref 8.9–10.3)
GFR calc Af Amer: 7 mL/min — ABNORMAL LOW (ref >60)
GFR calc non Af Amer: 6 mL/min — ABNORMAL LOW (ref >60)
Glucose, Bld: 156 mg/dL — ABNORMAL HIGH (ref 65–99)
POTASSIUM: 4.5 mmol/L (ref 3.5–5.1)
Sodium: 133 mmol/L — ABNORMAL LOW (ref 135–145)

## 2016-12-19 LAB — MAGNESIUM: MAGNESIUM: 2.3 mg/dL (ref 1.7–2.4)

## 2016-12-19 LAB — GLUCOSE, CAPILLARY
GLUCOSE-CAPILLARY: 141 mg/dL — AB (ref 65–99)
GLUCOSE-CAPILLARY: 185 mg/dL — AB (ref 65–99)
GLUCOSE-CAPILLARY: 193 mg/dL — AB (ref 65–99)
Glucose-Capillary: 169 mg/dL — ABNORMAL HIGH (ref 65–99)

## 2016-12-19 NOTE — Progress Notes (Signed)
Physical Therapy Treatment Patient Details Name: Phyllis Hernandez MRN: [id number] DOB: 01/31/1951 Today's Date: 12/19/2016    History of Present Illness Patient is a 66 y/o female who presents from home with dyspnea. Found to have HTN emergency with acute diastolic CHF. PMH includes HTN, HLD, depression.    PT Comments    Pt is able to walk with min guard assist and has some missteps but no LOB.  Her plan is to get home at baseline, but recommend she continue to walk regularly, may need to implement formal PT if her progress stalls at home.  Follow acutely for strengthening and gait as she is still off her feet most of the day.   Follow Up Recommendations  No PT follow up;Supervision - Intermittent     Equipment Recommendations  None recommended by PT    Recommendations for Other Services OT consult     Precautions / Restrictions Precautions Precautions: None Restrictions Weight Bearing Restrictions: No    Mobility  Bed Mobility Overal bed mobility: Modified Independent                Transfers Overall transfer level: Modified independent Equipment used: None Transfers: Sit to/from Stand Sit to Stand: Supervision         General transfer comment: Supervision for safety/lines.  Ambulation/Gait Ambulation/Gait assistance: Min guard Ambulation Distance (Feet): 200 Feet Assistive device: None Gait Pattern/deviations: Step-through pattern;Decreased stride length;Drifts right/left Gait velocity: decreased Gait velocity interpretation: Below normal speed for age/gender General Gait Details: mild drift to misstep but no outright LOB.     Stairs            Wheelchair Mobility    Modified Rankin (Stroke Patients Only)       Balance Overall balance assessment: Needs assistance Sitting-balance support: Feet supported Sitting balance-Leahy Scale: Good     Standing balance support: During functional activity Standing balance-Leahy Scale:  Good Standing balance comment: fair+ with gait                            Cognition Arousal/Alertness: Awake/alert Behavior During Therapy: WFL for tasks assessed/performed Overall Cognitive Status: Within Functional Limits for tasks assessed                                        Exercises General Exercises - Lower Extremity Ankle Circles/Pumps: AROM;Both;5 reps Long Arc Quad: Strengthening;Both;10 reps Hip ABduction/ADduction: Strengthening;Both;10 reps    General Comments        Pertinent Vitals/Pain Pain Assessment: No/denies pain    Home Living                      Prior Function            PT Goals (current goals can now be found in the care plan section) Acute Rehab PT Goals Patient Stated Goal: get home and feel stronger Progress towards PT goals: Progressing toward goals    Frequency    Min 3X/week      PT Plan Current plan remains appropriate    Co-evaluation              AM-PAC PT "6 Clicks" Daily Activity  Outcome Measure  Difficulty turning over in bed (including adjusting bedclothes, sheets and blankets)?: None Difficulty moving from lying on back to sitting on the side of the bed? :  None Difficulty sitting down on and standing up from a chair with arms (e.g., wheelchair, bedside commode, etc,.)?: None Help needed moving to and from a bed to chair (including a wheelchair)?: None Help needed walking in hospital room?: A Little Help needed climbing 3-5 steps with a railing? : A Little 6 Click Score: 22    End of Session Equipment Utilized During Treatment: Gait belt Activity Tolerance: Patient limited by fatigue Patient left: in chair;with call bell/phone within reach Nurse Communication: Mobility status PT Visit Diagnosis: Unsteadiness on feet (R26.81)     Time: 5397-6734 PT Time Calculation (min) (ACUTE ONLY): 18 min  Charges:  $Gait Training: 8-22 mins                    G Codes:   Functional Assessment Tool Used: AM-PAC 6 Clicks Basic Mobility     Ramond Dial 12/19/2016, 5:55 PM   Mee Hives, PT MS Acute Rehab Dept. Number: [facility]

## 2016-12-19 NOTE — Progress Notes (Signed)
Occupational Therapy Treatment Patient Details Name: Phyllis Hernandez MRN: [id number] DOB: 10/11/1950 Today's Date: 12/19/2016    History of present illness Patient is a 66 y/o female who presents from home with dyspnea. Found to have HTN emergency with acute diastolic CHF. PMH includes HTN, HLD, depression.   OT comments  Pt demonstrating ability to perform self care and toileting at a modified independent level. Pt placed on portable telemetry so she could move about her room with ease. Agreeable to staying up in recliner at end of session and in much better spirits. VSS throughout session. Will follow to reinforce energy conservation strategies and practice tub transfer.  Follow Up Recommendations  No OT follow up    Equipment Recommendations  Tub/shower seat    Recommendations for Other Services      Precautions / Restrictions Precautions Precautions: None       Mobility Bed Mobility Overal bed mobility: Modified Independent                Transfers Overall transfer level: Modified independent Equipment used: None                  Balance     Sitting balance-Leahy Scale: Good       Standing balance-Leahy Scale: Good                             ADL either performed or assessed with clinical judgement   ADL Overall ADL's : Modified independent                         Toilet Transfer: Modified Independent;Ambulation;Regular Toilet   Toileting- Clothing Manipulation and Hygiene: Modified independent;Sit to/from stand       Functional mobility during ADLs: Modified independent General ADL Comments: Pt placed on portable telemetry and demonstrating modified independence in ability to perform toileting, standing grooming and UB dressing modified independently. Pt has been routinely using BSC independently and performing self care with set up.      Vision       Perception     Praxis      Cognition Arousal/Alertness:  Awake/alert Behavior During Therapy: WFL for tasks assessed/performed Overall Cognitive Status: Within Functional Limits for tasks assessed                                          Exercises     Shoulder Instructions       General Comments      Pertinent Vitals/ Pain       Pain Assessment: No/denies pain  Home Living                                          Prior Functioning/Environment              Frequency  Min 2X/week        Progress Toward Goals  OT Goals(current goals can now be found in the care plan section)  Progress towards OT goals: Progressing toward goals  Acute Rehab OT Goals Patient Stated Goal: to go home OT Goal Formulation: With patient Time For Goal Achievement: 12/27/16 Potential to Achieve Goals: Good  Plan Discharge plan remains appropriate  Co-evaluation                 AM-PAC PT "6 Clicks" Daily Activity     Outcome Measure   Help from another person eating meals?: None Help from another person taking care of personal grooming?: None Help from another person toileting, which includes using toliet, bedpan, or urinal?: None Help from another person bathing (including washing, rinsing, drying)?: None Help from another person to put on and taking off regular upper body clothing?: None Help from another person to put on and taking off regular lower body clothing?: A Little 6 Click Score: 23    End of Session    OT Visit Diagnosis: Other (comment) (decreased activity tolerance)   Activity Tolerance Patient tolerated treatment well   Patient Left in chair;with call bell/phone within reach   Nurse Communication Other (comment) (placed pt on telemetry box)        Time: 1440-1505 OT Time Calculation (min): 25 min  Charges: OT General Charges $OT Visit: 1 Procedure OT Treatments $Self Care/Home Management : 23-37 mins   Malka So 12/19/2016, 3:13 PM  [phone]

## 2016-12-19 NOTE — Progress Notes (Signed)
Patient ID: JEWELIA BOCCHINO, female   DOB: [birth year], 66 y.o.   MRN: [id number] S:No complaints O:BP (!) 182/77 (BP Location: Right Arm)   Pulse 69   Temp 98.4 F (36.9 C) (Oral)   Resp 18   Ht 5\' 5"  (1.651 m)   Wt 102 kg (224 lb 12.8 oz)   SpO2 100%   BMI 37.41 kg/m   Intake/Output Summary (Last 24 hours) at 12/19/16 1455 Last data filed at 12/18/16 2207  Gross per 24 hour  Intake              480 ml  Output             2300 ml  Net            -1820 ml   Intake/Output: I/O last 3 completed shifts: In: 600 [P.O.:600] Out: 3600 [Urine:3600]  Intake/Output this shift:  No intake/output data recorded. Weight change: 0.998 kg (2 lb 3.2 oz) Gen: WD obese AAF in NAD CVS:no rub Resp:cta XTG:GYIRSW Ext: no edema   Recent Labs Lab 12/13/16 0544 12/14/16 1417 12/15/16 0520 12/16/16 1325 12/17/16 0825 12/19/16 0732  NA 131* 129* 131* 133* 136 133*  K 4.3 4.6 4.3 5.3* 4.2 4.5  CL 102 100* 102 107 106 105  CO2 18* 18* 17* 15* 18* 17*  GLUCOSE 174* 408* 157* 140* 128* 156*  BUN 71* 70* 73* 82* 82* 85*  CREATININE 6.67* 6.51* 6.63* 6.85* 6.66* 6.57*  ALBUMIN 2.7* 2.7*  --  2.9*  --   --   CALCIUM 8.5* 8.5* 8.6* 9.1 9.0 9.0  PHOS 5.6* 6.1*  --  7.0*  --   --    Liver Function Tests:  Recent Labs Lab 12/13/16 0544 12/14/16 1417 12/16/16 1325  ALBUMIN 2.7* 2.7* 2.9*   No results for input(s): LIPASE, AMYLASE in the last 168 hours. No results for input(s): AMMONIA in the last 168 hours. CBC:  Recent Labs Lab 12/14/16 1417  WBC 7.0  HGB 9.4*  HCT 29.1*  MCV 70.6*  PLT 160   Cardiac Enzymes: No results for input(s): CKTOTAL, CKMB, CKMBINDEX, TROPONINI in the last 168 hours. CBG:  Recent Labs Lab 12/18/16 1337 12/18/16 1627 12/18/16 2048 12/19/16 0744 12/19/16 1153  GLUCAP 233* 168* 198* 141* 185*    Iron Studies: No results for input(s): IRON, TIBC, TRANSFERRIN, FERRITIN in the last 72 hours. Studies/Results: Mr Jodene Nam Abdomen Wo Contrast  Result  Date: 12/18/2016 CLINICAL DATA:  66 year old with hypertension and worsening renal function. Concern for left renal artery stenosis based on renal artery duplex. EXAM: MRA ABDOMEN WITH CONTRAST TECHNIQUE: Multiplanar, multiecho pulse sequences of the abdomen and pelvis were obtained with intravenous contrast. Angiographic images of abdomen and pelvis were obtained using MRA technique with intravenous contrast. CONTRAST:  None COMPARISON:  Renal artery duplex report 12/14/2016 FINDINGS: Vascular structures This is a high quality noncontrast examination. Normal caliber of the abdominal aorta. Celiac trunk and main branch vessels are patent. SMA is patent. Proximal IMA is patent. Normal caliber of the proximal common iliac arteries. There are multiple renal arteries. Evidence for at least three right renal arteries. The main right renal artery is patent without significant narrowing. Main right renal artery is at the 10 o'clock position on the aorta. There is an accessory inferior and superior right renal artery. Evidence for two left renal arteries. The main left renal artery appears to be widely patent. There accessory left renal artery is just caudal to the main left renal  artery. There appears to be a stenosis involving this accessory left renal artery. Main left renal artery is at the 4 o'clock position and the accessory left renal artery is at the 2 o'clock position. Nonvascular structures No gross abnormality to the liver or gallbladder. Normal appearance of the pancreas and spleen. Normal appearance of the kidneys without hydronephrosis. Adrenals appear to be normal. No large pleural effusions or significant ascites. IMPRESSION: Multiple renal arteries. No significant stenosis involving the bilateral main renal arteries. There may be a stenosis involving the accessory left renal artery which could account for the abnormality on the renal duplex. Electronically Signed   By: Markus Daft M.D.   On: 12/18/2016 08:07    . aspirin  81 mg Oral Daily  . calcium acetate  1,334 mg Oral TID WC  . heparin subcutaneous  5,000 Units Subcutaneous Q8H  . insulin aspart  0-15 Units Subcutaneous TID WC  . insulin aspart  0-5 Units Subcutaneous QHS  . insulin aspart  8 Units Subcutaneous TID WC  . insulin glargine  22 Units Subcutaneous QHS  . isosorbide-hydrALAZINE  2 tablet Oral TID  . LORazepam  1 mg Intravenous Once  . mouth rinse  15 mL Mouth Rinse BID  . metoprolol tartrate  50 mg Oral BID  . pravastatin  40 mg Oral Daily  . sodium chloride flush  3 mL Intravenous Q12H    BMET    Component Value Date/Time   NA 133 (L) 12/19/2016 0732   K 4.5 12/19/2016 0732   CL 105 12/19/2016 0732   CO2 17 (L) 12/19/2016 0732   GLUCOSE 156 (H) 12/19/2016 0732   BUN 85 (H) 12/19/2016 0732   CREATININE 6.57 (H) 12/19/2016 0732   CALCIUM 9.0 12/19/2016 0732   GFRNONAA 6 (L) 12/19/2016 0732   GFRAA 7 (L) 12/19/2016 0732   CBC    Component Value Date/Time   WBC 7.0 12/14/2016 1417   RBC 4.12 12/14/2016 1417   HGB 9.4 (L) 12/14/2016 1417   HCT 29.1 (L) 12/14/2016 1417   PLT 160 12/14/2016 1417   MCV 70.6 (L) 12/14/2016 1417   MCH 22.8 (L) 12/14/2016 1417   MCHC 32.3 12/14/2016 1417   RDW 15.2 12/14/2016 1417   LYMPHSABS 1.2 12/10/2016 0322   MONOABS 1.1 (H) 12/10/2016 0322   EOSABS 0.0 12/10/2016 0322   BASOSABS 0.0 12/10/2016 0322     Assessment/Plan:  1. AKI/CKD vs progressive CKD.  Scr was 3.32 in March 2017 and did not have any follow up with her PCP since.  Likely has progressive CKD and is now nearing ESRD.  I discussed this with the patient as did Dr. Florene Glen, however I believe she is in denial of the severity of her condition and not sure she will follow up once stable for discharge.  Will continue to educate and stress the importance of close monitoring and compliance with BP meds 2. Hypertensive urgency- MRA reveals multiple renal arteries, however no stenosis in main arteries (2 renal arteries to  left kidney with stenosis involving the accessory and 3 renal arteries to the right kidney without stenosis).   3. Acute diastolic CHF- improved with diuresis 4. Hyponatremia- due to CKD and CHF 5. Anemia of CKD- will check iron stores and start ESA 6. SHPTH- will follow ca/phos/iPTH and treat accordingly 7. Nutrition- renal diet 8. DM- per primary 9. Vascular access- will need vein mapping and access placement if she is amenable  Donetta Potts, MD [facility] (  [phone]  

## 2016-12-19 NOTE — Progress Notes (Signed)
[facility] TEAM 1 - Stepdown/ICU TEAM  Phyllis Hernandez  PYK:[id number] DOB: 02-21-1951 DOA: 12/09/2016 PCP: Everardo Beals, NP    Brief Narrative:  66 y.o.F Hx DM, HTN, and CKD IV who presented with acute dyspnea. She summoned EMS, who found her SpO2 to be 70% and BP 250/140.  Subjective: The patient is working with therapy at the time of my visit.  She denies cp, sob, or abdom pain.    Assessment & Plan:  Hypertensive emergency with acute diastolic CHF exacerbation BP variable but improved overall - avoid Norvasc with patient's complaints of lower extremity edema - no ACEi/ARB due to kidney disease Filed Weights   12/16/16 0551 12/18/16 0653 12/19/16 0429  Weight: 101.5 kg (223 lb 11.2 oz) 101 kg (222 lb 9.6 oz) 102 kg (224 lb 12.8 oz)  .  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 Baseline crt 2.67 as of Aug 2017 - Nephrology following and reports pt may be ready for d/c by AM  Recent Labs Lab 12/14/16 1417 12/15/16 0520 12/16/16 1325 12/17/16 0825 12/19/16 0732  CREATININE 6.51* 6.63* 6.85* 6.66* 6.57*   DM2 CBG improved - follow w/o change today   Hypomagnesemia Corrected   Elevated troponin  Most c/w demand ischemia in setting of HTN emergency - no WMA on TTE - no indication for further inpt w/u   DVT prophylaxis: Lovenox Code Status: FULL CODE Family Communication: no family present at time of exam  Disposition Plan: plan for d/c home 7/3 if crt stable and pt does well w/ PT/OT today  Consultants:  Nephrology   Procedures: None  Antimicrobials:  None  Objective: Blood pressure (!) 182/77, pulse 69, temperature 98.4 F (36.9 C), temperature source Oral, resp. rate 18, height 5\' 5"  (1.651 m), weight 102 kg (224 lb 12.8 oz), SpO2 100 %.  Intake/Output Summary (Last 24 hours) at 12/19/16 1456 Last data filed at 12/18/16 2207  Gross per 24 hour  Intake              480 ml  Output             2300 ml  Net            -1820 ml   Filed  Weights   12/16/16 0551 12/18/16 0653 12/19/16 0429  Weight: 101.5 kg (223 lb 11.2 oz) 101 kg (222 lb 9.6 oz) 102 kg (224 lb 12.8 oz)    Examination: General: alert and pleasant  Lungs: CTA w/ no wheezing or crackles Cardiovascular: RRR   CBC:  Recent Labs Lab 12/14/16 1417  WBC 7.0  HGB 9.4*  HCT 29.1*  MCV 70.6*  PLT 539   Basic Metabolic Panel:  Recent Labs Lab 12/13/16 0544 12/14/16 1417 12/15/16 0520 12/16/16 1325 12/17/16 0825 12/19/16 0732  NA 131* 129* 131* 133* 136 133*  K 4.3 4.6 4.3 5.3* 4.2 4.5  CL 102 100* 102 107 106 105  CO2 18* 18* 17* 15* 18* 17*  GLUCOSE 174* 408* 157* 140* 128* 156*  BUN 71* 70* 73* 82* 82* 85*  CREATININE 6.67* 6.51* 6.63* 6.85* 6.66* 6.57*  CALCIUM 8.5* 8.5* 8.6* 9.1 9.0 9.0  MG  --   --   --   --  2.3 2.3  PHOS 5.6* 6.1*  --  7.0*  --   --    GFR: Estimated Creatinine Clearance: 10.4 mL/min (A) (by C-G formula based on SCr of 6.57 mg/dL (H)).  Liver Function Tests:  Recent Labs  Lab 12/13/16 0544 12/14/16 1417 12/16/16 1325  ALBUMIN 2.7* 2.7* 2.9*   CBG:  Recent Labs Lab 12/18/16 1337 12/18/16 1627 12/18/16 2048 12/19/16 0744 12/19/16 1153  GLUCAP 233* 168* 198* 141* 185*    Scheduled Meds: . aspirin  81 mg Oral Daily  . calcium acetate  1,334 mg Oral TID WC  . heparin subcutaneous  5,000 Units Subcutaneous Q8H  . insulin aspart  0-15 Units Subcutaneous TID WC  . insulin aspart  0-5 Units Subcutaneous QHS  . insulin aspart  8 Units Subcutaneous TID WC  . insulin glargine  22 Units Subcutaneous QHS  . isosorbide-hydrALAZINE  2 tablet Oral TID  . LORazepam  1 mg Intravenous Once  . mouth rinse  15 mL Mouth Rinse BID  . metoprolol tartrate  50 mg Oral BID  . pravastatin  40 mg Oral Daily  . sodium chloride flush  3 mL Intravenous Q12H     LOS: 10 days   Cherene Altes, MD [facility] Office  [phone] Pager - Text Page per Amion as per below:  On-Call/Text Page:      [email or web address]       password [email or web address]  If 7PM-7AM, please contact night-coverage [email or web address] Password [email or web address] 12/19/2016, 2:56 PM

## 2016-12-20 ENCOUNTER — Inpatient Hospital Stay (HOSPITAL_COMMUNITY): Payer: Medicaid Other

## 2016-12-20 DIAGNOSIS — N185 Chronic kidney disease, stage 5: Secondary | ICD-10-CM

## 2016-12-20 DIAGNOSIS — N171 Acute kidney failure with acute cortical necrosis: Secondary | ICD-10-CM

## 2016-12-20 LAB — BASIC METABOLIC PANEL
ANION GAP: 11 (ref 5–15)
BUN: 90 mg/dL — ABNORMAL HIGH (ref 6–20)
CALCIUM: 9 mg/dL (ref 8.9–10.3)
CO2: 18 mmol/L — ABNORMAL LOW (ref 22–32)
CREATININE: 6.9 mg/dL — AB (ref 0.44–1.00)
Chloride: 104 mmol/L (ref 101–111)
GFR calc Af Amer: 7 mL/min — ABNORMAL LOW (ref >60)
GFR, EST NON AFRICAN AMERICAN: 6 mL/min — AB (ref >60)
Glucose, Bld: 179 mg/dL — ABNORMAL HIGH (ref 65–99)
Potassium: 4.7 mmol/L (ref 3.5–5.1)
SODIUM: 133 mmol/L — AB (ref 135–145)

## 2016-12-20 LAB — GLUCOSE, CAPILLARY
GLUCOSE-CAPILLARY: 143 mg/dL — AB (ref 65–99)
GLUCOSE-CAPILLARY: 148 mg/dL — AB (ref 65–99)
GLUCOSE-CAPILLARY: 221 mg/dL — AB (ref 65–99)
Glucose-Capillary: 232 mg/dL — ABNORMAL HIGH (ref 65–99)

## 2016-12-20 LAB — CBC
HEMATOCRIT: 30.4 % — AB (ref 36.0–46.0)
Hemoglobin: 9.3 g/dL — ABNORMAL LOW (ref 12.0–15.0)
MCH: 21.7 pg — ABNORMAL LOW (ref 26.0–34.0)
MCHC: 30.6 g/dL (ref 30.0–36.0)
MCV: 70.9 fL — ABNORMAL LOW (ref 78.0–100.0)
PLATELETS: 262 10*3/uL (ref 150–400)
RBC: 4.29 MIL/uL (ref 3.87–5.11)
RDW: 15.1 % (ref 11.5–15.5)
WBC: 7.5 10*3/uL (ref 4.0–10.5)

## 2016-12-20 MED ORDER — INSULIN GLARGINE 100 UNIT/ML ~~LOC~~ SOLN
25.0000 [IU] | Freq: Every day | SUBCUTANEOUS | Status: DC
  Administered 2016-12-20 – 2016-12-27 (×8): 25 [IU] via SUBCUTANEOUS
  Filled 2016-12-20 (×9): qty 0.25

## 2016-12-20 MED ORDER — DARBEPOETIN ALFA 60 MCG/0.3ML IJ SOSY
60.0000 ug | PREFILLED_SYRINGE | INTRAMUSCULAR | Status: DC
  Filled 2016-12-20 (×2): qty 0.3

## 2016-12-20 NOTE — Progress Notes (Signed)
Patient ID: Phyllis Hernandez, female   DOB: 03-10-1951, 66 y.o.   MRN: [id number] S:No new complaints O:BP 135/63 (BP Location: Right Arm)   Pulse 70   Temp 98.1 F (36.7 C) (Oral)   Resp 18   Ht 5\' 5"  (1.651 m)   Wt 101.6 kg (224 lb)   SpO2 100%   BMI 37.28 kg/m   Intake/Output Summary (Last 24 hours) at 12/20/16 1026 Last data filed at 12/19/16 2115  Gross per 24 hour  Intake              240 ml  Output                0 ml  Net              240 ml   Intake/Output: I/O last 3 completed shifts: In: 480 [P.O.:480] Out: 900 [Urine:900]  Intake/Output this shift:  No intake/output data recorded. Weight change: -0.363 kg (-12.8 oz) Gen:NAD CVS:no rub Resp:cta CBJ:SEGBTD VVO:HYWVPXT edema   Recent Labs Lab 12/14/16 1417 12/15/16 0520 12/16/16 1325 12/17/16 0825 12/19/16 0732 12/20/16 0308  NA 129* 131* 133* 136 133* 133*  K 4.6 4.3 5.3* 4.2 4.5 4.7  CL 100* 102 107 106 105 104  CO2 18* 17* 15* 18* 17* 18*  GLUCOSE 408* 157* 140* 128* 156* 179*  BUN 70* 73* 82* 82* 85* 90*  CREATININE 6.51* 6.63* 6.85* 6.66* 6.57* 6.90*  ALBUMIN 2.7*  --  2.9*  --   --   --   CALCIUM 8.5* 8.6* 9.1 9.0 9.0 9.0  PHOS 6.1*  --  7.0*  --   --   --    Liver Function Tests:  Recent Labs Lab 12/14/16 1417 12/16/16 1325  ALBUMIN 2.7* 2.9*   No results for input(s): LIPASE, AMYLASE in the last 168 hours. No results for input(s): AMMONIA in the last 168 hours. CBC:  Recent Labs Lab 12/14/16 1417  WBC 7.0  HGB 9.4*  HCT 29.1*  MCV 70.6*  PLT 160   Cardiac Enzymes: No results for input(s): CKTOTAL, CKMB, CKMBINDEX, TROPONINI in the last 168 hours. CBG:  Recent Labs Lab 12/19/16 0744 12/19/16 1153 12/19/16 1632 12/19/16 1942 12/20/16 0723  GLUCAP 141* 185* 169* 193* 143*    Iron Studies: No results for input(s): IRON, TIBC, TRANSFERRIN, FERRITIN in the last 72 hours. Studies/Results: No results found. Marland Kitchen aspirin  81 mg Oral Daily  . calcium acetate  1,334 mg  Oral TID WC  . heparin subcutaneous  5,000 Units Subcutaneous Q8H  . insulin aspart  0-15 Units Subcutaneous TID WC  . insulin aspart  0-5 Units Subcutaneous QHS  . insulin aspart  8 Units Subcutaneous TID WC  . insulin glargine  22 Units Subcutaneous QHS  . isosorbide-hydrALAZINE  2 tablet Oral TID  . mouth rinse  15 mL Mouth Rinse BID  . metoprolol tartrate  50 mg Oral BID  . pravastatin  40 mg Oral Daily  . sodium chloride flush  3 mL Intravenous Q12H    BMET    Component Value Date/Time   NA 133 (L) 12/20/2016 0308   K 4.7 12/20/2016 0308   CL 104 12/20/2016 0308   CO2 18 (L) 12/20/2016 0308   GLUCOSE 179 (H) 12/20/2016 0308   BUN 90 (H) 12/20/2016 0308   CREATININE 6.90 (H) 12/20/2016 0308   CALCIUM 9.0 12/20/2016 0308   GFRNONAA 6 (L) 12/20/2016 0308   GFRAA 7 (L) 12/20/2016 0308   CBC  Component Value Date/Time   WBC 7.0 12/14/2016 1417   RBC 4.12 12/14/2016 1417   HGB 9.4 (L) 12/14/2016 1417   HCT 29.1 (L) 12/14/2016 1417   PLT 160 12/14/2016 1417   MCV 70.6 (L) 12/14/2016 1417   MCH 22.8 (L) 12/14/2016 1417   MCHC 32.3 12/14/2016 1417   RDW 15.2 12/14/2016 1417   LYMPHSABS 1.2 12/10/2016 0322   MONOABS 1.1 (H) 12/10/2016 0322   EOSABS 0.0 12/10/2016 0322   BASOSABS 0.0 12/10/2016 0322     Assessment/Plan:  1. Non-oliguric AKI/CKD4 vs progressive CKD.  Scr was 3.32 in March 2017 and did not have any follow up with her PCP since.  She has been seen by Dr. Lorrene Reid in our practice, however has not followed up in a year.  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  I discussed this with the patient as did Dr. Florene Glen, however I believe she is in denial of the severity of her condition and not sure she will follow up once stable for discharge.  I discussed the need to proceed with vein mapping and vascular access placement as well as education and preparation to initiate dialysis.  I also stressed compliance with BP meds 2. Hypertensive  urgency- MRA reveals multiple renal arteries, however no stenosis in main arteries (2 renal arteries to left kidney with stenosis involving the accessory and 3 renal arteries to the right kidney without stenosis).   3. Acute diastolic CHF- improved with diuresis 4. Hyponatremia- due to CKD and CHF 5. Anemia of CKD- will check iron stores and start ESA 6. SHPTH- will follow ca/phos/iPTH and treat accordingly 7. Nutrition- renal diet 8. DM- per primary 9. Vascular access- will need vein mapping and access placement if she is amenable  Donetta Potts, MD [employer] [phone]

## 2016-12-20 NOTE — Progress Notes (Signed)
PROGRESS NOTE    Phyllis Hernandez  HUT:[id number] DOB: [birth year] DOA: 12/09/2016 PCP: Everardo Beals, NP   Brief Narrative:  Phyllis Hernandez is a 66 y.o. BF PMHx NIDDM and HTN and CKD IV? who presents with acute dyspnea.  The patient was in her usual state of health until a week or so ago when she was talking on the phone, had an episode which she could hardly breathing had to sit down, and the feeling slowly passed by itself.  Since then she has been fine, had noticed a little leg swelling yesterday, but has been taking her BP meds normally, going about her normal business today (went to the store, walked around, did some housework, all without difficulty) then tonight around midnight, she was getting ready for bed and started to feel SOB, sweaty and nervous.  She tried to cough but couldn't cough anything up, and the SOB got worse and worse until she panicked and called 9-1-1.  EMS found her SpO2 70% and BP 250/140 and transported.    Subjective: 7/3   A/O 4, negative CP, negative SOB, negative N/V. Patient procrastinating on making decision on whether to accept HD.      Assessment & Plan:   Principal Problem:   Acute on chronic renal failure (HCC) Active Problems:   Type 2 diabetes mellitus with complication, without long-term current use of insulin (HCC)   Essential hypertension   Hypertensive emergency   Hypertensive emergency  -Off Nitroglycerin gtt -Echocardiogram: Normal see results below -Strict I/Os, since admission - 12.2 L -daily weights Filed Weights   12/18/16 0653 12/19/16 0429 12/20/16 0511  Weight: 222 lb 9.6 oz (101 kg) 224 lb 12.8 oz (102 kg) 224 lb (101.6 kg)  -Bidil 20-30 7.5 mg  (2 tablets )TID -Metoprolol 50 mg BID  Acute on chronic renal failure (Baseline Cr 2.67 as of Aug 2017)    -Unclear baseline in our records, she suspects baseline Cr is ~4?  Follows with nephrology.  Mild non-gap acidosis. Lab Results  Component Value Date   CREATININE 6.90 (H) 12/20/2016   CREATININE 6.57 (H) 12/19/2016   CREATININE 6.66 (H) 12/17/2016  -Patient has serious risk of ESRD, nephrology has recommendedCO2 Renal Angiogram. -Abdomen MRA shows no significant renal artery stenosis see results below -Counseled patient at length that on 7/4 she needed to decide if she going to proceed with HD or refused treatment. Counseled patient on sequela of refusing treatment, consequences being DEATH. Nephrology has also counseled patient on the need to proceed with HD.  DM type II   uncontrolled with renal complications -7/3 increased Lantus 25 units daily -NovoLog 8 units QAC -Moderate SSI    DVT prophylaxis: Lovenox Code Status: Full Family Communication: None Disposition Plan: TBD   Consultants:  Nephrology Dr. Erling Cruz    Procedures/Significant Events:  6/23 Echocardiogram: LVEF=  60% to 65%. 7/1 MRI abdomen WO contrast: -Multiple renal arteries. No significant stenosis involving the bilateral main renal arteries.  -There may be a stenosis involving the accessory left renal artery which could account for the abnormality on the renal duplex.   VENTILATOR SETTINGS: None   Cultures None  Antimicrobials: Anti-infectives    None       Devices None   LINES / TUBES:  None    Continuous Infusions:    Objective: Vitals:   12/20/16 0511 12/20/16 0900 12/20/16 1300 12/20/16 1725  BP: 135/63   (!) 125/51  Pulse: 70   77  Resp: 18 18  18  Temp: 98.1 F (36.7 C) 98.6 F (37 C) 98 F (36.7 C) 98 F (36.7 C)  TempSrc: Oral Oral Oral Oral  SpO2: 100%  100% 100%  Weight: 224 lb (101.6 kg)     Height:        Intake/Output Summary (Last 24 hours) at 12/20/16 1924 Last data filed at 12/20/16 1700  Gross per 24 hour  Intake              600 ml  Output             1400 ml  Net             -800 ml   Filed Weights   12/18/16 0653 12/19/16 0429 12/20/16 0511  Weight: 222 lb 9.6 oz (101 kg) 224 lb 12.8 oz  (102 kg) 224 lb (101.6 kg)    Examination:  General: A/O 4, No acute respiratory distress Eyes: negative scleral hemorrhage, negative anisocoria, negative icterus ENT: Negative Runny nose, negative gingival bleeding, Neck:  Negative scars, masses, torticollis, lymphadenopathy, JVD Lungs: Clear to auscultation bilaterally without wheezes or crackles Cardiovascular: Regular rate and rhythm without murmur gallop or rub normal S1 and S2 Abdomen: negative abdominal pain, nondistended, positive soft, bowel sounds, no rebound, no ascites, no appreciable mass Extremities: No significant cyanosis, clubbing, or edema bilateral lower extremities  .     Data Reviewed: Care during the described time interval was provided by me .  I have reviewed this patient's available data, including medical history, events of note, physical examination, and all test results as part of my evaluation. I have personally reviewed and interpreted all radiology studies.  CBC:  Recent Labs Lab 12/14/16 1417 12/20/16 1239  WBC 7.0 7.5  HGB 9.4* 9.3*  HCT 29.1* 30.4*  MCV 70.6* 70.9*  PLT 160 818   Basic Metabolic Panel:  Recent Labs Lab 12/14/16 1417 12/15/16 0520 12/16/16 1325 12/17/16 0825 12/19/16 0732 12/20/16 0308  NA 129* 131* 133* 136 133* 133*  K 4.6 4.3 5.3* 4.2 4.5 4.7  CL 100* 102 107 106 105 104  CO2 18* 17* 15* 18* 17* 18*  GLUCOSE 408* 157* 140* 128* 156* 179*  BUN 70* 73* 82* 82* 85* 90*  CREATININE 6.51* 6.63* 6.85* 6.66* 6.57* 6.90*  CALCIUM 8.5* 8.6* 9.1 9.0 9.0 9.0  MG  --   --   --  2.3 2.3  --   PHOS 6.1*  --  7.0*  --   --   --    GFR: Estimated Creatinine Clearance: 9.9 mL/min (A) (by C-G formula based on SCr of 6.9 mg/dL (H)). Liver Function Tests:  Recent Labs Lab 12/14/16 1417 12/16/16 1325  ALBUMIN 2.7* 2.9*   No results for input(s): LIPASE, AMYLASE in the last 168 hours. No results for input(s): AMMONIA in the last 168 hours. Coagulation Profile:  Recent  Labs Lab 12/15/16 0939 12/16/16 0335  INR 1.01 1.03   Cardiac Enzymes: No results for input(s): CKTOTAL, CKMB, CKMBINDEX, TROPONINI in the last 168 hours. BNP (last 3 results) No results for input(s): PROBNP in the last 8760 hours. HbA1C: No results for input(s): HGBA1C in the last 72 hours. CBG:  Recent Labs Lab 12/19/16 1632 12/19/16 1942 12/20/16 0723 12/20/16 1202 12/20/16 1644  GLUCAP 169* 193* 143* 148* 232*   Lipid Profile: No results for input(s): CHOL, HDL, LDLCALC, TRIG, CHOLHDL, LDLDIRECT in the last 72 hours. Thyroid Function Tests: No results for input(s): TSH, T4TOTAL, FREET4, T3FREE, THYROIDAB in  the last 72 hours. Anemia Panel: No results for input(s): VITAMINB12, FOLATE, FERRITIN, TIBC, IRON, RETICCTPCT in the last 72 hours. Urine analysis: No results found for: COLORURINE, APPEARANCEUR, LABSPEC, PHURINE, GLUCOSEU, HGBUR, BILIRUBINUR, KETONESUR, PROTEINUR, UROBILINOGEN, NITRITE, LEUKOCYTESUR Sepsis Labs: @LABRCNTIP (procalcitonin:4,lacticidven:4)  ) No results found for this or any previous visit (from the past 240 hour(s)).       Radiology Studies: US Renal  Result Date: 12/20/2016 CLINICAL DATA:  Acute renal failure EXAM: RENAL / URINARY TRACT ULTRASOUND COMPLETE COMPARISON:  Abdominal MRI December 18, 2016 FINDINGS: Right Kidney: Length: 11.2 cm. Echogenicity is borderline increased. Renal cortical thickness is within normal limits. No mass, perinephric fluid, or hydronephrosis visualized. No sonographically demonstrable calculus or ureterectasis. Left Kidney: Length: 12.4 cm. Echogenicity is increased. Renal cortical thickness is normal. No mass, perinephric fluid, or hydronephrosis visualized. No sonographically demonstrable calculus or ureterectasis. Bladder: Appears normal for degree of bladder distention. IMPRESSION: Increase in renal echogenicity, more notable on the left than on the right. This is a finding that may be seen with medical renal disease.  No obstructing focus in either kidney. Study otherwise unremarkable. Electronically Signed   By: Lowella Grip III M.D.   On: 12/20/2016 13:54        Scheduled Meds: . aspirin  81 mg Oral Daily  . calcium acetate  1,334 mg Oral TID WC  . darbepoetin (ARANESP) injection - NON-DIALYSIS  60 mcg Subcutaneous Q Tue-1800  . heparin subcutaneous  5,000 Units Subcutaneous Q8H  . insulin aspart  0-15 Units Subcutaneous TID WC  . insulin aspart  0-5 Units Subcutaneous QHS  . insulin aspart  8 Units Subcutaneous TID WC  . insulin glargine  22 Units Subcutaneous QHS  . isosorbide-hydrALAZINE  2 tablet Oral TID  . mouth rinse  15 mL Mouth Rinse BID  . metoprolol tartrate  50 mg Oral BID  . pravastatin  40 mg Oral Daily  . sodium chloride flush  3 mL Intravenous Q12H   Continuous Infusions:    LOS: 11 days    Time spent: 10 minutes    WOODS, Geraldo Docker, MD [facility] Pager [phone]   If 7PM-7AM, please contact night-coverage [email or web address] Password [facility] 12/20/2016, 7:24 PM

## 2016-12-21 LAB — BASIC METABOLIC PANEL
ANION GAP: 15 (ref 5–15)
BUN: 88 mg/dL — ABNORMAL HIGH (ref 6–20)
CALCIUM: 9.4 mg/dL (ref 8.9–10.3)
CO2: 16 mmol/L — ABNORMAL LOW (ref 22–32)
Chloride: 105 mmol/L (ref 101–111)
Creatinine, Ser: 6.6 mg/dL — ABNORMAL HIGH (ref 0.44–1.00)
GFR, EST AFRICAN AMERICAN: 7 mL/min — AB (ref >60)
GFR, EST NON AFRICAN AMERICAN: 6 mL/min — AB (ref >60)
Glucose, Bld: 144 mg/dL — ABNORMAL HIGH (ref 65–99)
POTASSIUM: 4.7 mmol/L (ref 3.5–5.1)
SODIUM: 136 mmol/L (ref 135–145)

## 2016-12-21 LAB — GLUCOSE, CAPILLARY
GLUCOSE-CAPILLARY: 140 mg/dL — AB (ref 65–99)
GLUCOSE-CAPILLARY: 173 mg/dL — AB (ref 65–99)
GLUCOSE-CAPILLARY: 151 mg/dL — AB (ref 65–99)
GLUCOSE-CAPILLARY: 190 mg/dL — AB (ref 65–99)

## 2016-12-21 MED ORDER — MENTHOL 3 MG MT LOZG
1.0000 | LOZENGE | OROMUCOSAL | Status: DC | PRN
  Administered 2016-12-21: 3 mg via ORAL
  Filled 2016-12-21: qty 9

## 2016-12-21 NOTE — Plan of Care (Signed)
Problem: Activity: Goal: Risk for activity intolerance will decrease Outcome: Progressing Tolerates ambulation in room. Participates with PT.

## 2016-12-21 NOTE — Progress Notes (Signed)
Physical Therapy Treatment/DC Note Patient Details Name: Phyllis Hernandez MRN: [id number] DOB: 02/19/1951 Today's Date: 12/21/2016    History of Present Illness Patient is a 66 y/o female who presents from home with dyspnea. Found to have HTN emergency with acute diastolic CHF. PMH includes HTN, HLD, depression.    PT Comments    Pt doing well with mobility and no further PT needed.  Ready for dc from PT standpoint.     Follow Up Recommendations  No PT follow up     Equipment Recommendations  None recommended by PT    Recommendations for Other Services       Precautions / Restrictions Precautions Precautions: None    Mobility  Bed Mobility Overal bed mobility: Modified Independent                Transfers Overall transfer level: Modified independent Equipment used: None                Ambulation/Gait Ambulation/Gait assistance: Modified independent (Device/Increase time) Ambulation Distance (Feet): 350 Feet Assistive device: None Gait Pattern/deviations: WFL(Within Functional Limits)     General Gait Details: Steady gait    Stairs            Wheelchair Mobility    Modified Rankin (Stroke Patients Only)       Balance Overall balance assessment: Modified Independent                                          Cognition Arousal/Alertness: Awake/alert Behavior During Therapy: Flat affect Overall Cognitive Status: Within Functional Limits for tasks assessed                                        Exercises      General Comments        Pertinent Vitals/Pain Pain Assessment: No/denies pain    Home Living                      Prior Function            PT Goals (current goals can now be found in the care plan section) Acute Rehab PT Goals Patient Stated Goal: get home and feel stronger Progress towards PT goals: Goals met/education completed, patient discharged from PT     Frequency           PT Plan Current plan remains appropriate    Co-evaluation              AM-PAC PT "6 Clicks" Daily Activity  Outcome Measure  Difficulty turning over in bed (including adjusting bedclothes, sheets and blankets)?: None Difficulty moving from lying on back to sitting on the side of the bed? : None Difficulty sitting down on and standing up from a chair with arms (e.g., wheelchair, bedside commode, etc,.)?: None Help needed moving to and from a bed to chair (including a wheelchair)?: None Help needed walking in hospital room?: None Help needed climbing 3-5 steps with a railing? : None 6 Click Score: 24    End of Session   Activity Tolerance: Patient tolerated treatment well Patient left: in bed;with call bell/phone within reach Nurse Communication: Mobility status PT Visit Diagnosis: Unsteadiness on feet (R26.81)     Time: 2767-0110 PT Time Calculation (min) (ACUTE  ONLY): 14 min  Charges:  $Gait Training: 8-22 mins                    G Codes:       [facility] [place] 12/21/2016, 1:16 PM

## 2016-12-21 NOTE — Progress Notes (Signed)
Occupational Therapy Treatment and Discharge Patient Details Name: Phyllis Hernandez MRN: [id number] DOB: 1951/05/05 Today's Date: 12/21/2016    History of present illness Patient is a 66 y/o female who presents from home with dyspnea. Found to have HTN emergency with acute diastolic CHF. PMH includes HTN, HLD, depression.   OT comments  Pt has been consistently performing ADL and mobility about her room at a modified independent level. Pt with many questions about the logistics of preparing for dialysis and the day to day lifestyle of a dialysis patient. Encouraged pt to talk to MD. No further OT needs at this time. OT will sign off.  Follow Up Recommendations  No OT follow up    Equipment Recommendations  Tub/shower seat    Recommendations for Other Services      Precautions / Restrictions Precautions Precautions: None       Mobility Bed Mobility Overal bed mobility: Modified Independent                Transfers Overall transfer level: Modified independent Equipment used: None                  Balance                                           ADL either performed or assessed with clinical judgement   ADL Overall ADL's : Modified independent                         Toilet Transfer: Modified Independent;Ambulation;Regular Toilet   Toileting- Clothing Manipulation and Hygiene: Modified independent;Sit to/from stand   Tub/ Shower Transfer: Modified independent;Ambulation Tub/Shower Transfer Details (indicate cue type and reason): simulated Functional mobility during ADLs: Modified independent       Vision       Perception     Praxis      Cognition Arousal/Alertness: Awake/alert Behavior During Therapy: Flat affect Overall Cognitive Status: Within Functional Limits for tasks assessed (tearful at times)                                          Exercises     Shoulder Instructions        General Comments      Pertinent Vitals/ Pain       Pain Assessment: No/denies pain  Home Living                                          Prior Functioning/Environment              Frequency           Progress Toward Goals  OT Goals(current goals can now be found in the care plan section)  Progress towards OT goals: Goals met/education completed, patient discharged from OT  Acute Rehab OT Goals Patient Stated Goal: get home and feel stronger OT Goal Formulation: With patient  Plan Discharge plan remains appropriate    Co-evaluation                 AM-PAC PT "6 Clicks" Daily Activity     Outcome Measure   Help from another person  eating meals?: None Help from another person taking care of personal grooming?: None Help from another person toileting, which includes using toliet, bedpan, or urinal?: None Help from another person bathing (including washing, rinsing, drying)?: None Help from another person to put on and taking off regular upper body clothing?: None Help from another person to put on and taking off regular lower body clothing?: None 6 Click Score: 24    End of Session    OT Visit Diagnosis: Other (comment) (decreased activity tolerance)   Activity Tolerance Patient tolerated treatment well   Patient Left in bed;with call bell/phone within reach   Nurse Communication          Time: 9494-4739 OT Time Calculation (min): 28 min  Charges: OT General Charges $OT Visit: 1 Procedure OT Treatments $Self Care/Home Management : 23-37 mins    Malka So 12/21/2016, 10:04 AM  [phone]

## 2016-12-21 NOTE — Progress Notes (Signed)
Patient ID: Phyllis Hernandez, female   DOB: [birth year], 66 y.o.   MRN: [id number] S:No complaints, tearful but more interactive today O:BP (!) 123/50 (BP Location: Right Arm)   Pulse 61   Temp 98.3 F (36.8 C) (Oral)   Resp 18   Ht 5\' 5"  (1.651 m)   Wt 100.1 kg (220 lb 10.9 oz)   SpO2 100%   BMI 36.72 kg/m   Intake/Output Summary (Last 24 hours) at 12/21/16 1423 Last data filed at 12/21/16 0402  Gross per 24 hour  Intake              240 ml  Output             1200 ml  Net             -960 ml   Intake/Output: I/O last 3 completed shifts: In: 840 [P.O.:840] Out: 2200 [Urine:2200]  Intake/Output this shift:  No intake/output data recorded. Weight change: -1.506 kg (-3 lb 5.1 oz) Gen:NAD CVS:no rub Resp:cta XLK:GMWNUU Ext: no edema   Recent Labs Lab 12/15/16 0520 12/16/16 1325 12/17/16 0825 12/19/16 0732 12/20/16 0308 12/21/16 0320  NA 131* 133* 136 133* 133* 136  K 4.3 5.3* 4.2 4.5 4.7 4.7  CL 102 107 106 105 104 105  CO2 17* 15* 18* 17* 18* 16*  GLUCOSE 157* 140* 128* 156* 179* 144*  BUN 73* 82* 82* 85* 90* 88*  CREATININE 6.63* 6.85* 6.66* 6.57* 6.90* 6.60*  ALBUMIN  --  2.9*  --   --   --   --   CALCIUM 8.6* 9.1 9.0 9.0 9.0 9.4  PHOS  --  7.0*  --   --   --   --    Liver Function Tests:  Recent Labs Lab 12/16/16 1325  ALBUMIN 2.9*   No results for input(s): LIPASE, AMYLASE in the last 168 hours. No results for input(s): AMMONIA in the last 168 hours. CBC:  Recent Labs Lab 12/20/16 1239  WBC 7.5  HGB 9.3*  HCT 30.4*  MCV 70.9*  PLT 262   Cardiac Enzymes: No results for input(s): CKTOTAL, CKMB, CKMBINDEX, TROPONINI in the last 168 hours. CBG:  Recent Labs Lab 12/20/16 1202 12/20/16 1644 12/20/16 2118 12/21/16 0759 12/21/16 1142  GLUCAP 148* 232* 221* 140* 173*    Iron Studies: No results for input(s): IRON, TIBC, TRANSFERRIN, FERRITIN in the last 72 hours. Studies/Results: US Renal  Result Date: 12/20/2016 CLINICAL DATA:  Acute  renal failure EXAM: RENAL / URINARY TRACT ULTRASOUND COMPLETE COMPARISON:  Abdominal MRI December 18, 2016 FINDINGS: Right Kidney: Length: 11.2 cm. Echogenicity is borderline increased. Renal cortical thickness is within normal limits. No mass, perinephric fluid, or hydronephrosis visualized. No sonographically demonstrable calculus or ureterectasis. Left Kidney: Length: 12.4 cm. Echogenicity is increased. Renal cortical thickness is normal. No mass, perinephric fluid, or hydronephrosis visualized. No sonographically demonstrable calculus or ureterectasis. Bladder: Appears normal for degree of bladder distention. IMPRESSION: Increase in renal echogenicity, more notable on the left than on the right. This is a finding that may be seen with medical renal disease. No obstructing focus in either kidney. Study otherwise unremarkable. Electronically Signed   By: Lowella Grip III M.D.   On: 12/20/2016 13:54   . aspirin  81 mg Oral Daily  . calcium acetate  1,334 mg Oral TID WC  . darbepoetin (ARANESP) injection - NON-DIALYSIS  60 mcg Subcutaneous Q Tue-1800  . heparin subcutaneous  5,000 Units Subcutaneous Q8H  . insulin aspart  0-15 Units Subcutaneous TID WC  . insulin aspart  0-5 Units Subcutaneous QHS  . insulin aspart  8 Units Subcutaneous TID WC  . insulin glargine  25 Units Subcutaneous QHS  . isosorbide-hydrALAZINE  2 tablet Oral TID  . mouth rinse  15 mL Mouth Rinse BID  . metoprolol tartrate  50 mg Oral BID  . pravastatin  40 mg Oral Daily  . sodium chloride flush  3 mL Intravenous Q12H    BMET    Component Value Date/Time   NA 136 12/21/2016 0320   K 4.7 12/21/2016 0320   CL 105 12/21/2016 0320   CO2 16 (L) 12/21/2016 0320   GLUCOSE 144 (H) 12/21/2016 0320   BUN 88 (H) 12/21/2016 0320   CREATININE 6.60 (H) 12/21/2016 0320   CALCIUM 9.4 12/21/2016 0320   GFRNONAA 6 (L) 12/21/2016 0320   GFRAA 7 (L) 12/21/2016 0320   CBC    Component Value Date/Time   WBC 7.5 12/20/2016 1239   RBC  4.29 12/20/2016 1239   HGB 9.3 (L) 12/20/2016 1239   HCT 30.4 (L) 12/20/2016 1239   PLT 262 12/20/2016 1239   MCV 70.9 (L) 12/20/2016 1239   MCH 21.7 (L) 12/20/2016 1239   MCHC 30.6 12/20/2016 1239   RDW 15.1 12/20/2016 1239   LYMPHSABS 1.2 12/10/2016 0322   MONOABS 1.1 (H) 12/10/2016 0322   EOSABS 0.0 12/10/2016 0322   BASOSABS 0.0 12/10/2016 0322    Assessment/Plan:  1. Non-oliguric AKI/CKD4 vs progressive CKD. Scr was 3.32 in March 2017 and did not have any follow up with her PCP since. She has been seen by Dr. Lorrene Reid in our practice, however has not followed up in a year.  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 I discussed this with the patient as did Dr. Florene Glen, and her primary nephrologist Dr. Lorrene Reid, however she is in denial of the severity of her condition and not sure she will follow up once stable for discharge. I discussed the need to proceed with vein mapping and vascular access placement as well as education and preparation to initiate dialysis.  I also stressed compliance with BP meds.  She was more interactive today and I started a dialysis video before I left her room.  Hopefully she will agree to AVF/AVG placement before discharge. 2. Hypertensive urgency- MRA reveals multiple renal arteries, however no stenosis in main arteries (2 renal arteries to left kidney with stenosis involving the accessory and 3 renal arteries to the right kidney without stenosis).  3. Acute diastolic CHF- improved with diuresis 4. Hyponatremia- due to CKD and CHF 5. Anemia of CKD- will check iron stores and start ESA 6. SHPTH- will follow ca/phos/iPTH and treat accordingly 7. Nutrition- renal diet 8. DM- per primary 9. Vascular access- will need vein mapping and access placement if she is amenable  Donetta Potts, MD [employer] [phone]

## 2016-12-21 NOTE — Progress Notes (Signed)
Phyllis Hernandez  Phyllis Hernandez  ZOX:[id number] DOB: 1951/02/14 DOA: 12/09/2016 PCP: Everardo Beals, NP    Brief Narrative:  66 y.o.F Hx DM, HTN, and CKD IV who presented with acute dyspnea. She summoned EMS, who found her SpO2 to be 70% and BP 250/140.  Subjective: The patient remains unsure as to whether she is willing to commit to hemodialysis at this time or not.  She wishes to speak again to the nephrologist today.  She denies chest pain nausea vomiting shortness of breath or abdominal pain.  I have had a lengthy discussion at the patient's bedside with her regarding her kidney failure and the need for dialysis.  I have explained to her that her kidney function has not been improving and has in fact only been slowly getting worse.  I explained that we expected she would soon reach a point where she would need dialysis and to not get dialysis would mean she would die.  I explained I desire to proceed with obtaining vascular access so that dialysis, when required, could be provided without delay.  I have explained to her that being discharged home without vascular access and without a decision on whether to pursue dialysis or not could potentially result in her death if her kidney function continues to worsen.  I have explained to her that her death could come as a result of electrolyte abnormalities for which she would have no warning.  I have explained my concern that should she choose to further delay the decision on dialysis that she could suffer sudden death.    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 Baseline crt 2.67 as of Aug 2017 - Nephrology  following and now suggesting pursuing vascular access as patient appears to be steadily approaching ESRD and the need for dialysis - patient continues to hesitate - further counseling carried out today  Recent Labs Lab 12/16/16 1325 12/17/16 0825 12/19/16 0732 12/20/16 0308 12/21/16 0320  CREATININE 6.85* 6.66* 6.57* 6.90* 6.60*   DM2 CBG variable - no change in treatment today  Hypomagnesemia Corrected to normal as of 7/2  Elevated troponin  Most c/w demand ischemia in setting of HTN emergency - no WMA on TTE - no indication for further inpt w/u   DVT prophylaxis: Lovenox Code Status: FULL CODE Family Communication: no family present at time of exam  Disposition Plan: Awaiting patient decision on hemodialysis/vascular access  Consultants:  Nephrology   Procedures: None  Antimicrobials:  None  Objective: Blood pressure (!) 123/50, pulse 61, temperature 98.3 F (36.8 C), temperature source Oral, resp. rate 18, height 5\' 5"  (1.651 m), weight 100.1 kg (220 lb 10.9 oz), SpO2 100 %.  Intake/Output Summary (Last 24 hours) at 12/21/16 1100 Last data filed at 12/21/16 0402  Gross per 24 hour  Intake              600 ml  Output             2200 ml  Net            -1600 ml   Filed Weights   12/19/16 0429 12/20/16 0511 12/21/16  0500  Weight: 102 kg (224 lb 12.8 oz) 101.6 kg (224 lb) 100.1 kg (220 lb 10.9 oz)    Examination: General: Awake alert and oriented - no respiratory distress Lungs: Clear to auscultation throughout without wheezing Cardiovascular: RRR - no appreciable rub  CBC:  Recent Labs Lab 12/14/16 1417 12/20/16 1239  WBC 7.0 7.5  HGB 9.4* 9.3*  HCT 29.1* 30.4*  MCV 70.6* 70.9*  PLT 160 110   Basic Metabolic Panel:  Recent Labs Lab 12/14/16 1417  12/16/16 1325 12/17/16 0825 12/19/16 0732 12/20/16 0308 12/21/16 0320  NA 129*  < > 133* 136 133* 133* 136  K 4.6  < > 5.3* 4.2 4.5 4.7 4.7  CL 100*  < > 107 106 105 104 105  CO2 18*  < > 15*  18* 17* 18* 16*  GLUCOSE 408*  < > 140* 128* 156* 179* 144*  BUN 70*  < > 82* 82* 85* 90* 88*  CREATININE 6.51*  < > 6.85* 6.66* 6.57* 6.90* 6.60*  CALCIUM 8.5*  < > 9.1 9.0 9.0 9.0 9.4  MG  --   --   --  2.3 2.3  --   --   PHOS 6.1*  --  7.0*  --   --   --   --   < > = values in this interval not displayed. GFR: Estimated Creatinine Clearance: 10.2 mL/min (A) (by C-G formula based on SCr of 6.6 mg/dL (H)).  Liver Function Tests:  Recent Labs Lab 12/14/16 1417 12/16/16 1325  ALBUMIN 2.7* 2.9*   CBG:  Recent Labs Lab 12/20/16 0723 12/20/16 1202 12/20/16 1644 12/20/16 2118 12/21/16 0759  GLUCAP 143* 148* 232* 221* 140*    Scheduled Meds: . aspirin  81 mg Oral Daily  . calcium acetate  1,334 mg Oral TID WC  . darbepoetin (ARANESP) injection - NON-DIALYSIS  60 mcg Subcutaneous Q Tue-1800  . heparin subcutaneous  5,000 Units Subcutaneous Q8H  . insulin aspart  0-15 Units Subcutaneous TID WC  . insulin aspart  0-5 Units Subcutaneous QHS  . insulin aspart  8 Units Subcutaneous TID WC  . insulin glargine  25 Units Subcutaneous QHS  . isosorbide-hydrALAZINE  2 tablet Oral TID  . mouth rinse  15 mL Mouth Rinse BID  . metoprolol tartrate  50 mg Oral BID  . pravastatin  40 mg Oral Daily  . sodium chloride flush  3 mL Intravenous Q12H     LOS: 12 days   Cherene Altes, MD [facility] Office  [phone] Pager - Text Page per Amion as per below:  On-Call/Text Page:      [email or web address]      password [email or web address]  If 7PM-7AM, please contact night-coverage [email or web address] Password [email or web address] 12/21/2016, 11:00 AM

## 2016-12-22 ENCOUNTER — Ambulatory Visit (HOSPITAL_COMMUNITY): Payer: Medicaid Other

## 2016-12-22 DIAGNOSIS — N185 Chronic kidney disease, stage 5: Secondary | ICD-10-CM

## 2016-12-22 DIAGNOSIS — Z01810 Encounter for preprocedural cardiovascular examination: Secondary | ICD-10-CM

## 2016-12-22 LAB — GLUCOSE, CAPILLARY
GLUCOSE-CAPILLARY: 159 mg/dL — AB (ref 65–99)
Glucose-Capillary: 118 mg/dL — ABNORMAL HIGH (ref 65–99)
Glucose-Capillary: 224 mg/dL — ABNORMAL HIGH (ref 65–99)
Glucose-Capillary: 199 mg/dL — ABNORMAL HIGH (ref 65–99)

## 2016-12-22 LAB — IRON AND TIBC
IRON: 84 ug/dL (ref 28–170)
SATURATION RATIOS: 22 % (ref 10.4–31.8)
TIBC: 384 ug/dL (ref 250–450)
UIBC: 300 ug/dL

## 2016-12-22 LAB — BASIC METABOLIC PANEL
ANION GAP: 10 (ref 5–15)
BUN: 93 mg/dL — ABNORMAL HIGH (ref 6–20)
CHLORIDE: 107 mmol/L (ref 101–111)
CO2: 19 mmol/L — AB (ref 22–32)
Calcium: 9.1 mg/dL (ref 8.9–10.3)
Creatinine, Ser: 6.85 mg/dL — ABNORMAL HIGH (ref 0.44–1.00)
GFR calc non Af Amer: 6 mL/min — ABNORMAL LOW (ref >60)
GFR, EST AFRICAN AMERICAN: 7 mL/min — AB (ref >60)
Glucose, Bld: 124 mg/dL — ABNORMAL HIGH (ref 65–99)
Potassium: 4.6 mmol/L (ref 3.5–5.1)
Sodium: 136 mmol/L (ref 135–145)

## 2016-12-22 LAB — FERRITIN: Ferritin: 46 ng/mL (ref 11–307)

## 2016-12-22 MED ORDER — CEFAZOLIN SODIUM-DEXTROSE 1-4 GM/50ML-% IV SOLN: 1.0000 g | INTRAVENOUS | Status: DC

## 2016-12-22 MED ORDER — CEFAZOLIN SODIUM-DEXTROSE 2-4 GM/100ML-% IV SOLN
2.0000 g | INTRAVENOUS | Status: AC
  Administered 2016-12-23: 2 g via INTRAVENOUS
  Filled 2016-12-22: qty 100

## 2016-12-22 NOTE — Progress Notes (Signed)
Patient ID: Phyllis Hernandez, female   DOB: [birth year], 66 y.o.   MRN: [id number] S:No complaints, reports good appetite O:BP (!) 112/45   Pulse 65   Temp 98.3 F (36.8 C) (Oral)   Resp 18   Ht 5\' 10"  (1.778 m)   Wt 102.3 kg (225 lb 8 oz)   SpO2 98%   BMI 32.36 kg/m   Intake/Output Summary (Last 24 hours) at 12/22/16 0934 Last data filed at 12/21/16 2125  Gross per 24 hour  Intake              723 ml  Output              500 ml  Net              223 ml   Intake/Output: I/O last 3 completed shifts: In: 963 [P.O.:960; I.V.:3] Out: 1300 [Urine:1300]  Intake/Output this shift:  No intake/output data recorded. Weight change: 2.186 kg (4 lb 13.1 oz) Gen:NAD CVS:no rub Resp:cta HER:DEYCXK Ext:1+ edema   Recent Labs Lab 12/16/16 1325 12/17/16 0825 12/19/16 0732 12/20/16 0308 12/21/16 0320 12/22/16 0609  NA 133* 136 133* 133* 136 136  K 5.3* 4.2 4.5 4.7 4.7 4.6  CL 107 106 105 104 105 107  CO2 15* 18* 17* 18* 16* 19*  GLUCOSE 140* 128* 156* 179* 144* 124*  BUN 82* 82* 85* 90* 88* 93*  CREATININE 6.85* 6.66* 6.57* 6.90* 6.60* 6.85*  ALBUMIN 2.9*  --   --   --   --   --   CALCIUM 9.1 9.0 9.0 9.0 9.4 9.1  PHOS 7.0*  --   --   --   --   --    Liver Function Tests:  Recent Labs Lab 12/16/16 1325  ALBUMIN 2.9*   No results for input(s): LIPASE, AMYLASE in the last 168 hours. No results for input(s): AMMONIA in the last 168 hours. CBC:  Recent Labs Lab 12/20/16 1239  WBC 7.5  HGB 9.3*  HCT 30.4*  MCV 70.9*  PLT 262   Cardiac Enzymes: No results for input(s): CKTOTAL, CKMB, CKMBINDEX, TROPONINI in the last 168 hours. CBG:  Recent Labs Lab 12/21/16 0759 12/21/16 1142 12/21/16 1616 12/21/16 2059 12/22/16 0724  GLUCAP 140* 173* 151* 190* 118*    Iron Studies: No results for input(s): IRON, TIBC, TRANSFERRIN, FERRITIN in the last 72 hours. Studies/Results: US Renal  Result Date: 12/20/2016 CLINICAL DATA:  Acute renal failure EXAM: RENAL / URINARY  TRACT ULTRASOUND COMPLETE COMPARISON:  Abdominal MRI December 18, 2016 FINDINGS: Right Kidney: Length: 11.2 cm. Echogenicity is borderline increased. Renal cortical thickness is within normal limits. No mass, perinephric fluid, or hydronephrosis visualized. No sonographically demonstrable calculus or ureterectasis. Left Kidney: Length: 12.4 cm. Echogenicity is increased. Renal cortical thickness is normal. No mass, perinephric fluid, or hydronephrosis visualized. No sonographically demonstrable calculus or ureterectasis. Bladder: Appears normal for degree of bladder distention. IMPRESSION: Increase in renal echogenicity, more notable on the left than on the right. This is a finding that may be seen with medical renal disease. No obstructing focus in either kidney. Study otherwise unremarkable. Electronically Signed   By: Lowella Grip III M.D.   On: 12/20/2016 13:54   . aspirin  81 mg Oral Daily  . calcium acetate  1,334 mg Oral TID WC  . darbepoetin (ARANESP) injection - NON-DIALYSIS  60 mcg Subcutaneous Q Tue-1800  . heparin subcutaneous  5,000 Units Subcutaneous Q8H  . insulin aspart  0-15 Units Subcutaneous  TID WC  . insulin aspart  0-5 Units Subcutaneous QHS  . insulin aspart  8 Units Subcutaneous TID WC  . insulin glargine  25 Units Subcutaneous QHS  . isosorbide-hydrALAZINE  2 tablet Oral TID  . mouth rinse  15 mL Mouth Rinse BID  . metoprolol tartrate  50 mg Oral BID  . pravastatin  40 mg Oral Daily  . sodium chloride flush  3 mL Intravenous Q12H    BMET    Component Value Date/Time   NA 136 12/22/2016 0609   K 4.6 12/22/2016 0609   CL 107 12/22/2016 0609   CO2 19 (L) 12/22/2016 0609   GLUCOSE 124 (H) 12/22/2016 0609   BUN 93 (H) 12/22/2016 0609   CREATININE 6.85 (H) 12/22/2016 0609   CALCIUM 9.1 12/22/2016 0609   GFRNONAA 6 (L) 12/22/2016 0609   GFRAA 7 (L) 12/22/2016 0609   CBC    Component Value Date/Time   WBC 7.5 12/20/2016 1239   RBC 4.29 12/20/2016 1239   HGB 9.3 (L)  12/20/2016 1239   HCT 30.4 (L) 12/20/2016 1239   PLT 262 12/20/2016 1239   MCV 70.9 (L) 12/20/2016 1239   MCH 21.7 (L) 12/20/2016 1239   MCHC 30.6 12/20/2016 1239   RDW 15.1 12/20/2016 1239   LYMPHSABS 1.2 12/10/2016 0322   MONOABS 1.1 (H) 12/10/2016 0322   EOSABS 0.0 12/10/2016 0322   BASOSABS 0.0 12/10/2016 0322    Assessment/Plan:  1. Non-oliguric AKI/CKD4vs progressive CKD. Scr was 3.32 in March 2017 and did not have any follow up with her PCP since. She has been seen by Dr. Lorrene Reid in our practice, however has not followed up in a year. 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 I discussed this with the patient as did Dr. Florene Glen, and her primary nephrologist Dr. Lorrene Reid, however she is in denial of the severity of her condition and not sure she will follow up once stable for discharge. I discussed the need to proceed with vein mapping and vascular access placement as well as education and preparation to initiate dialysis. I also stressedcompliance with BP meds.   1. She is now willing to have access placed and have consulted Dr. Donzetta Matters with [facility] 2. She is not uremic and hopefully we can discharge her after her vascular access placement with close follow up with Dr. Lorrene Reid and avoid an HD catheter 2. Hypertensive urgency- MRA reveals multiple renal arteries, however no stenosis in main arteries (2 renal arteries to left kidney with stenosis involving the accessory and 3 renal arteries to the right kidney without stenosis).  3. Acute diastolic CHF- improved with diuresis 4. Hyponatremia- due to CKD and CHF 5. Anemia of CKD- will check iron stores and start ESA 6. SHPTH- will follow ca/phos/iPTH and treat accordingly 7. Nutrition- renal diet 8. DM- per primary 9. Vascular access- vein mapping and access placement pending now that she is amenable   Donetta Potts, MD [employer] [phone]

## 2016-12-22 NOTE — Progress Notes (Signed)
PROGRESS NOTE    Phyllis Hernandez  DSK:[id number] DOB: [birth year] DOA: 12/09/2016 PCP: Everardo Beals, NP   Brief Narrative:  Phyllis Hernandez is a 66 y.o. BF PMHx NIDDM and HTN and CKD IV? who presents with acute dyspnea.  The patient was in her usual state of health until a week or so ago when she was talking on the phone, had an episode which she could hardly breathing had to sit down, and the feeling slowly passed by itself.  Since then she has been fine, had noticed a little leg swelling yesterday, but has been taking her BP meds normally, going about her normal business today (went to the store, walked around, did some housework, all without difficulty) then tonight around midnight, she was getting ready for bed and started to feel SOB, sweaty and nervous.  She tried to cough but couldn't cough anything up, and the SOB got worse and worse until she panicked and called 9-1-1.  EMS found her SpO2 70% and BP 250/140 and transported.    Subjective: 7/5  A/O 4, negative CP, negative SOB, negative N/V. Patient has agreed to move forward with preliminary HD workup.        Assessment & Plan:   Principal Problem:   Acute on chronic renal failure (HCC) Active Problems:   Type 2 diabetes mellitus with complication, without long-term current use of insulin (HCC)   Essential hypertension   Hypertensive emergency   Hypertensive emergency  -Off Nitroglycerin gtt -Echocardiogram: Normal see results below -Strict I/Os, since admission - 12.5 L -daily weights Filed Weights   12/20/16 0511 12/21/16 0500 12/22/16 0500  Weight: 224 lb (101.6 kg) 220 lb 10.9 oz (100.1 kg) 225 lb 8 oz (102.3 kg)  -Bidil 20-30 7.5 mg  (2 tablets )TID -Metoprolol 50 mg BID  Acute on chronic renal failure (Baseline Cr 2.67 as of Aug 2017)    -Unclear baseline in our records, she suspects baseline Cr is ~4?  Follows with nephrology.  Mild non-gap acidosis. Lab Results  Component Value Date   CREATININE 6.85 (H) 12/22/2016   CREATININE 6.60 (H) 12/21/2016   CREATININE 6.90 (H) 12/20/2016  -Abdomen MRA shows no significant renal artery stenosis see results below -7/5 patient has agreed to proceed with HD workup. Hold heparin, patient NPO. Have spoken with [facility] will arrange for Vas-Cath to be placed in [facility] to perform vein mapping today.  DM type II   uncontrolled with renal complications -Lantus 25 units daily -NovoLog 8 units QAC -Moderate SSI    DVT prophylaxis: Lovenox Code Status: Full Family Communication: None Disposition Plan: TBD   Consultants:  Nephrology Dr. Ollen Gross Powell/Dr.Coladonato    Procedures/Significant Events:  6/23 Echocardiogram: LVEF=  60% to 65%. 7/1 MRI abdomen WO contrast: -Multiple renal arteries. No significant stenosis involving the bilateral main renal arteries.  -There may be a stenosis involving the accessory left renal artery which could account for the abnormality on the renal duplex.   VENTILATOR SETTINGS: None   Cultures None  Antimicrobials: Anti-infectives    None       Devices None   LINES / TUBES:  None    Continuous Infusions:    Objective: Vitals:   12/21/16 2053 12/22/16 0500 12/22/16 0813 12/22/16 0859  BP: (!) 150/69 125/61 (!) 112/45   Pulse: 65 65    Resp:      Temp: (!) 97.4 F (36.3 C) 98.3 F (36.8 C)    TempSrc: Oral Oral  SpO2: 100% 98%    Weight:  225 lb 8 oz (102.3 kg)    Height:    5\' 10"  (1.778 m)    Intake/Output Summary (Last 24 hours) at 12/22/16 1027 Last data filed at 12/21/16 2125  Gross per 24 hour  Intake              723 ml  Output              500 ml  Net              223 ml   Filed Weights   12/20/16 0511 12/21/16 0500 12/22/16 0500  Weight: 224 lb (101.6 kg) 220 lb 10.9 oz (100.1 kg) 225 lb 8 oz (102.3 kg)    Examination:  General: A/O 4, No acute respiratory distress Eyes: negative scleral hemorrhage, negative  anisocoria, negative icterus ENT: Negative Runny nose, negative gingival bleeding, Neck:  Negative scars, masses, torticollis, lymphadenopathy, JVD Lungs: Clear to auscultation bilaterally without wheezes or crackles Cardiovascular: Regular rate and rhythm without murmur gallop or rub normal S1 and S2 Abdomen: negative abdominal pain, nondistended, positive soft, bowel sounds, no rebound, no ascites, no appreciable mass Extremities: No significant cyanosis, clubbing, or edema bilateral lower extremities  .     Data Reviewed: Care during the described time interval was provided by me .  I have reviewed this patient's available data, including medical history, events of note, physical examination, and all test results as part of my evaluation. I have personally reviewed and interpreted all radiology studies.  CBC:  Recent Labs Lab 12/20/16 1239  WBC 7.5  HGB 9.3*  HCT 30.4*  MCV 70.9*  PLT 681   Basic Metabolic Panel:  Recent Labs Lab 12/16/16 1325 12/17/16 0825 12/19/16 0732 12/20/16 0308 12/21/16 0320 12/22/16 0609  NA 133* 136 133* 133* 136 136  K 5.3* 4.2 4.5 4.7 4.7 4.6  CL 107 106 105 104 105 107  CO2 15* 18* 17* 18* 16* 19*  GLUCOSE 140* 128* 156* 179* 144* 124*  BUN 82* 82* 85* 90* 88* 93*  CREATININE 6.85* 6.66* 6.57* 6.90* 6.60* 6.85*  CALCIUM 9.1 9.0 9.0 9.0 9.4 9.1  MG  --  2.3 2.3  --   --   --   PHOS 7.0*  --   --   --   --   --    GFR: Estimated Creatinine Clearance: 10.9 mL/min (A) (by C-G formula based on SCr of 6.85 mg/dL (H)). Liver Function Tests:  Recent Labs Lab 12/16/16 1325  ALBUMIN 2.9*   No results for input(s): LIPASE, AMYLASE in the last 168 hours. No results for input(s): AMMONIA in the last 168 hours. Coagulation Profile:  Recent Labs Lab 12/16/16 0335  INR 1.03   Cardiac Enzymes: No results for input(s): CKTOTAL, CKMB, CKMBINDEX, TROPONINI in the last 168 hours. BNP (last 3 results) No results for input(s): PROBNP in the  last 8760 hours. HbA1C: No results for input(s): HGBA1C in the last 72 hours. CBG:  Recent Labs Lab 12/21/16 0759 12/21/16 1142 12/21/16 1616 12/21/16 2059 12/22/16 0724  GLUCAP 140* 173* 151* 190* 118*   Lipid Profile: No results for input(s): CHOL, HDL, LDLCALC, TRIG, CHOLHDL, LDLDIRECT in the last 72 hours. Thyroid Function Tests: No results for input(s): TSH, T4TOTAL, FREET4, T3FREE, THYROIDAB in the last 72 hours. Anemia Panel: No results for input(s): VITAMINB12, FOLATE, FERRITIN, TIBC, IRON, RETICCTPCT in the last 72 hours. Urine analysis: No results found for: COLORURINE, APPEARANCEUR, [place],  PHURINE, GLUCOSEU, HGBUR, BILIRUBINUR, KETONESUR, PROTEINUR, UROBILINOGEN, NITRITE, LEUKOCYTESUR Sepsis Labs: @LABRCNTIP (procalcitonin:4,lacticidven:4)  ) No results found for this or any previous visit (from the past 240 hour(s)).       Radiology Studies: US Renal  Result Date: 12/20/2016 CLINICAL DATA:  Acute renal failure EXAM: RENAL / URINARY TRACT ULTRASOUND COMPLETE COMPARISON:  Abdominal MRI December 18, 2016 FINDINGS: Right Kidney: Length: 11.2 cm. Echogenicity is borderline increased. Renal cortical thickness is within normal limits. No mass, perinephric fluid, or hydronephrosis visualized. No sonographically demonstrable calculus or ureterectasis. Left Kidney: Length: 12.4 cm. Echogenicity is increased. Renal cortical thickness is normal. No mass, perinephric fluid, or hydronephrosis visualized. No sonographically demonstrable calculus or ureterectasis. Bladder: Appears normal for degree of bladder distention. IMPRESSION: Increase in renal echogenicity, more notable on the left than on the right. This is a finding that may be seen with medical renal disease. No obstructing focus in either kidney. Study otherwise unremarkable. Electronically Signed   By: Lowella Grip III M.D.   On: 12/20/2016 13:54        Scheduled Meds: . aspirin  81 mg Oral Daily  . calcium acetate   1,334 mg Oral TID WC  . darbepoetin (ARANESP) injection - NON-DIALYSIS  60 mcg Subcutaneous Q Tue-1800  . heparin subcutaneous  5,000 Units Subcutaneous Q8H  . insulin aspart  0-15 Units Subcutaneous TID WC  . insulin aspart  0-5 Units Subcutaneous QHS  . insulin aspart  8 Units Subcutaneous TID WC  . insulin glargine  25 Units Subcutaneous QHS  . isosorbide-hydrALAZINE  2 tablet Oral TID  . mouth rinse  15 mL Mouth Rinse BID  . metoprolol tartrate  50 mg Oral BID  . pravastatin  40 mg Oral Daily  . sodium chloride flush  3 mL Intravenous Q12H   Continuous Infusions:    LOS: 13 days    Time spent: 10 minutes    Carlyle Mcelrath, Geraldo Docker, MD [facility] Pager [phone]   If 7PM-7AM, please contact night-coverage [email or web address] Password [email or web address] 12/22/2016, 10:27 AM

## 2016-12-22 NOTE — Progress Notes (Signed)
Right  Upper Extremity Vein Map    Cephalic  Segment Diameter Depth Comment  1. Axilla 3.58mm 36mm   2. Mid upper arm 2.64mm 68mm   3. Above AC 3.30mm 1mm   4. In [facility] 3.21mm 7.38mm   5. Below AC 2.60mm 9.35mm   6. Mid forearm 2.68mm 44mm   7. Wrist 1.44mm 1.74mm    mm mm    mm mm    mm mm     Left Upper Extremity Vein Map    Cephalic  Segment Diameter Depth Comment  1. Axilla 3.43mm 23mm   2. Mid upper arm 3.73mm 37mm   3. Above [facility] 3.44mm 27mm   4. In AC 73mm 6.2mm   5. Below AC 2.54mm 17mm   6. Mid forearm mm mm IV and bandages  7. Wrist mm mm IV and bandages   mm mm    mm mm    mm mm    Basilic  Segment Diameter Depth Comment  1. Axilla mm mm   2. Mid upper arm 3.79mm 19mm   3. Above AC 75mm 4.64mm   4. In AC 3.20mm 24mm   5. Below AC 3.77mm 34mm branch  6. Mid forearm mm mm IV and bandages  7. Wrist mm mm IV and bandages   mm mm    mm mm    mm mm

## 2016-12-22 NOTE — Care Management Note (Signed)
Case Management Note  Patient Details  Name: Phyllis Hernandez MRN: [id number] Date of Birth: [birth year]  Subjective/Objective: Pt presented for Dyspnea-Acute on Chronic Renal Failure/ CHF. Pt initiated on IV Lasix. Pt is from home alone. Pt has PCP at [facility]. Pt is able to get medications without any problems.                   Action/Plan: CM did provide pt information in regards to [facility]. CM did discuss the need for a scale to weigh daily. Pt is without a scale. Unable to get a scale from the clinic at this time. Pt may benefit from [facility] once stable. CM did make a referral to [facility] [place] resources. CM will continue to monitor.   Expected Discharge Date:                  Expected Discharge Plan:  [place]  In-House Referral:  NA  Discharge planning Services  CM Consult  Post Acute Care Choice:    Choice offered to:     DME Arranged:    DME Agency:     HH Arranged:    HH Agency:     Status of Service:  In process, will continue to follow  If discussed at [place] of Stay Meetings, dates discussed:  12-15-16, 12-20-16  Additional Comments 12/22/2016 Elenor Quinones, RN, BSN [phone] Pt is now in agreement to begin process for HD - vein mapping ordered.   1656 12-20-16 Jacqlyn Krauss, RN,BSN [phone] Cr increased- Nephrology following. Question Vein Mapping. Pt may benefit from [facility] RN for medication management.  CM will continue to monitor.    [address], [place] Plan for Renal Artery Stent. CM will continue to monitor for additional needs. Maryclare Labrador, RN 12/22/2016, 11:55 AM

## 2016-12-22 NOTE — Consult Note (Addendum)
Hospital Consult    Reason for Consult:  Hd access Requesting Physician:  Marval Regal MRN #:  [id number]  History of Present Illness: This is a 66 y.o. female with ckd now in need of permanent access. She has not had upper extremity surgery in the past. Does not take blood thinners.  Right hand dominant.   Past Medical History:  Diagnosis Date  . Allergy   . Anemia   . Anemia in CKD (chronic kidney disease)   . Depression   . Hyperlipidemia   . Hypertension     Past Surgical History:  Procedure Laterality Date  . TUBAL LIGATION      No Known Allergies  Prior to Admission medications   Medication Sig Start Date End Date Taking? Authorizing Provider  amLODipine (NORVASC) 10 MG tablet Take 10 mg by mouth daily.   Yes [provider]  aspirin 81 MG chewable tablet Chew 81 mg by mouth daily.   Yes [provider]  ferrous sulfate 325 (65 FE) MG tablet Take 325 mg by mouth daily with breakfast.   Yes [provider]  glipiZIDE (GLUCOTROL) 10 MG tablet Take 10 mg by mouth 2 (two) times daily before a meal.   Yes [provider]  indapamide (LOZOL) 1.25 MG tablet Take 1.25 mg by mouth daily.   Yes [provider]  metFORMIN (GLUCOPHAGE) 1000 MG tablet Take 1,000 mg by mouth 2 (two) times daily with a meal.   Yes [provider]  metoprolol (LOPRESSOR) 50 MG tablet Take 50 mg by mouth 2 (two) times daily.   Yes [provider]  pravastatin (PRAVACHOL) 40 MG tablet Take 40 mg by mouth daily.   Yes [provider]    Social History   Social History  . Marital status: Legally Separated    Spouse name: N/A  . Number of children: N/A  . Years of education: N/A   Occupational History  . Not on file.   Social History Main Topics  . Smoking status: Never Smoker  . Smokeless tobacco: Never Used  . Alcohol use Not on file  . Drug use: Unknown  . Sexual activity: Not on file   Other Topics Concern  . Not on  file   Social History Narrative  . No narrative on file     Family History  Problem Relation Age of Onset  . Hypertension Mother   . Diabetes Mother   . Hypertension Maternal Grandmother   . Diabetes Maternal Grandmother     REVIEW OF SYSTEMS (negative unless checked):   Cardiac:  []  Chest pain or chest pressure? []  Shortness of breath upon activity? []  Shortness of breath when lying flat? []  Irregular heart rhythm?  Vascular:  []  Pain in calf, thigh, or hip brought on by walking? []  Pain in feet at night that wakes you up from your sleep? []  Blood clot in your veins? []  Leg swelling?  Pulmonary:  []  Oxygen at home? []  Productive cough? []  Wheezing?  Neurologic:  []  Sudden weakness in arms or legs? []  Sudden numbness in arms or legs? []  Sudden onset of difficult speaking or slurred speech? []  Temporary loss of vision in one eye? []  Problems with dizziness?  Gastrointestinal:  []  Blood in stool? []  Vomited blood?  Genitourinary:  []  Burning when urinating? []  Blood in urine?  Psychiatric:  []  Major depression  Hematologic:  []  Bleeding problems? []  Problems with blood clotting?  Dermatologic:  []  Rashes or ulcers?  Constitutional:  []   Fever or chills?  Ear/Nose/Throat:  []  Change in hearing? []  Nose bleeds? []  Sore throat?  Musculoskeletal:  []  Back pain? []  Joint pain? []  Muscle pain?    Physical Examination  Vitals:   12/22/16 0500 12/22/16 0813  BP: 125/61 (!) 112/45  Pulse: 65   Resp:    Temp: 98.3 F (36.8 C)    Body mass index is 32.36 kg/m.  General:  WDWN in NAD HENT: WNL, normocephalic Pulmonary: normal non-labored breathing, without Rales, rhonchi,  wheezing Cardiac: rrr Palpable radial pulses bilaterally 2+ bilateral dp Abdomen: soft, NT/ND, no masses Extremities: no previous surgeries, left forearm has iv Neurologic: A&O X 3; SENSATION: normal; MOTOR FUNCTION:  moving all extremities equally. Speech is  fluent/normal Psychiatric:  Appropriate mood and affet  CBC    Component Value Date/Time   WBC 7.5 12/20/2016 1239   RBC 4.29 12/20/2016 1239   HGB 9.3 (L) 12/20/2016 1239   HCT 30.4 (L) 12/20/2016 1239   PLT 262 12/20/2016 1239   MCV 70.9 (L) 12/20/2016 1239   MCH 21.7 (L) 12/20/2016 1239   MCHC 30.6 12/20/2016 1239   RDW 15.1 12/20/2016 1239   LYMPHSABS 1.2 12/10/2016 0322   MONOABS 1.1 (H) 12/10/2016 0322   EOSABS 0.0 12/10/2016 0322   BASOSABS 0.0 12/10/2016 0322    BMET    Component Value Date/Time   NA 136 12/22/2016 0609   K 4.6 12/22/2016 0609   CL 107 12/22/2016 0609   CO2 19 (L) 12/22/2016 0609   GLUCOSE 124 (H) 12/22/2016 0609   BUN 93 (H) 12/22/2016 0609   CREATININE 6.85 (H) 12/22/2016 0609   CALCIUM 9.1 12/22/2016 0609   GFRNONAA 6 (L) 12/22/2016 0609   GFRAA 7 (L) 12/22/2016 0609    COAGS: Lab Results  Component Value Date   INR 1.03 12/16/2016   INR 1.01 12/15/2016   INR 1.0 04/17/2008     Non-Invasive Vascular Imaging:   Vein mapping ordered  ASSESSMENT/PLAN: This is a 67 y.o. female with advanced ckd in need of permanent access. Vein mapping ordered today, has palpable radial pulses bilaterally. Tentatively plan for upper arm avf vs avg with tdc as she is now amenable tomorrow as time allows.   Talar Fraley C. Donzetta Matters, MD [facility] [place] Office: [phone] Pager: [phone]

## 2016-12-23 ENCOUNTER — Inpatient Hospital Stay (HOSPITAL_COMMUNITY): Payer: Medicaid Other | Admitting: Certified Registered Nurse Anesthetist

## 2016-12-23 ENCOUNTER — Inpatient Hospital Stay (HOSPITAL_COMMUNITY): Payer: Medicaid Other

## 2016-12-23 ENCOUNTER — Encounter (HOSPITAL_COMMUNITY): Payer: Self-pay | Admitting: Certified Registered Nurse Anesthetist

## 2016-12-23 ENCOUNTER — Other Ambulatory Visit: Payer: Self-pay

## 2016-12-23 ENCOUNTER — Encounter (HOSPITAL_COMMUNITY)
Admission: EM | Disposition: A | Discharge status: Home Health | Payer: Self-pay | Source: Home / Self Care | Attending: Internal Medicine

## 2016-12-23 DIAGNOSIS — N186 End stage renal disease: Secondary | ICD-10-CM

## 2016-12-23 DIAGNOSIS — Z48812 Encounter for surgical aftercare following surgery on the circulatory system: Secondary | ICD-10-CM

## 2016-12-23 HISTORY — PX: AV FISTULA PLACEMENT: SHX1204

## 2016-12-23 HISTORY — PX: INSERTION OF DIALYSIS CATHETER: SHX1324

## 2016-12-23 LAB — RENAL FUNCTION PANEL
ANION GAP: 10 (ref 5–15)
Albumin: 2.9 g/dL — ABNORMAL LOW (ref 3.5–5.0)
BUN: 100 mg/dL — ABNORMAL HIGH (ref 6–20)
CHLORIDE: 107 mmol/L (ref 101–111)
CO2: 16 mmol/L — AB (ref 22–32)
Calcium: 9.2 mg/dL (ref 8.9–10.3)
Creatinine, Ser: 7.17 mg/dL — ABNORMAL HIGH (ref 0.44–1.00)
GFR, EST AFRICAN AMERICAN: 6 mL/min — AB (ref >60)
GFR, EST NON AFRICAN AMERICAN: 5 mL/min — AB (ref >60)
Glucose, Bld: 167 mg/dL — ABNORMAL HIGH (ref 65–99)
POTASSIUM: 4.7 mmol/L (ref 3.5–5.1)
Phosphorus: 5.9 mg/dL — ABNORMAL HIGH (ref 2.5–4.6)
Sodium: 133 mmol/L — ABNORMAL LOW (ref 135–145)

## 2016-12-23 LAB — CBC
HEMATOCRIT: 27 % — AB (ref 36.0–46.0)
HEMOGLOBIN: 8.5 g/dL — AB (ref 12.0–15.0)
MCH: 22.5 pg — ABNORMAL LOW (ref 26.0–34.0)
MCHC: 31.5 g/dL (ref 30.0–36.0)
MCV: 71.6 fL — AB (ref 78.0–100.0)
PLATELETS: 200 10*3/uL (ref 150–400)
RBC: 3.77 MIL/uL — AB (ref 3.87–5.11)
RDW: 15.4 % (ref 11.5–15.5)
WBC: 6.8 10*3/uL (ref 4.0–10.5)

## 2016-12-23 LAB — GLUCOSE, CAPILLARY
GLUCOSE-CAPILLARY: 150 mg/dL — AB (ref 65–99)
GLUCOSE-CAPILLARY: 139 mg/dL — AB (ref 65–99)
GLUCOSE-CAPILLARY: 143 mg/dL — AB (ref 65–99)
GLUCOSE-CAPILLARY: 110 mg/dL — AB (ref 65–99)
Glucose-Capillary: 141 mg/dL — ABNORMAL HIGH (ref 65–99)
Glucose-Capillary: 145 mg/dL — ABNORMAL HIGH (ref 65–99)

## 2016-12-23 SURGERY — ARTERIOVENOUS (AV) FISTULA CREATION
Anesthesia: General | Site: Neck | Laterality: Right

## 2016-12-23 MED ORDER — EPHEDRINE SULFATE-NACL 50-0.9 MG/10ML-% IV SOSY
PREFILLED_SYRINGE | INTRAVENOUS | Status: DC | PRN
Start: 2016-12-23 — End: 2016-12-23
  Administered 2016-12-23: 10 mg via INTRAVENOUS
  Administered 2016-12-23 (×2): 5 mg via INTRAVENOUS
  Administered 2016-12-23: 10 mg via INTRAVENOUS

## 2016-12-23 MED ORDER — HEPARIN SODIUM (PORCINE) 1000 UNIT/ML IJ SOLN
INTRAMUSCULAR | Status: AC
  Filled 2016-12-23: qty 1

## 2016-12-23 MED ORDER — LIDOCAINE 2% (20 MG/ML) 5 ML SYRINGE
INTRAMUSCULAR | Status: DC | PRN
  Administered 2016-12-23: 60 mg via INTRAVENOUS

## 2016-12-23 MED ORDER — LIDOCAINE-PRILOCAINE 2.5-2.5 % EX CREA: 1.0000 "application " | TOPICAL_CREAM | CUTANEOUS | Status: DC | PRN

## 2016-12-23 MED ORDER — SODIUM CHLORIDE 0.9 % IV SOLN
INTRAVENOUS | Status: DC
  Administered 2016-12-23 (×2): via INTRAVENOUS

## 2016-12-23 MED ORDER — DARBEPOETIN ALFA 60 MCG/0.3ML IJ SOSY
60.0000 ug | PREFILLED_SYRINGE | INTRAMUSCULAR | Status: DC
  Administered 2016-12-24: 60 ug via INTRAVENOUS
  Filled 2016-12-23: qty 0.3

## 2016-12-23 MED ORDER — ONDANSETRON HCL 4 MG/2ML IJ SOLN: 4.0000 mg | Freq: Once | INTRAMUSCULAR | Status: DC | PRN

## 2016-12-23 MED ORDER — SODIUM CHLORIDE 0.9 % IV SOLN: 100.0000 mL | INTRAVENOUS | Status: DC | PRN

## 2016-12-23 MED ORDER — ONDANSETRON HCL 4 MG/2ML IJ SOLN
INTRAMUSCULAR | Status: DC | PRN
  Administered 2016-12-23: 4 mg via INTRAVENOUS

## 2016-12-23 MED ORDER — PROPOFOL 10 MG/ML IV BOLUS
INTRAVENOUS | Status: DC | PRN
  Administered 2016-12-23: 140 mg via INTRAVENOUS

## 2016-12-23 MED ORDER — HEPARIN SODIUM (PORCINE) 1000 UNIT/ML DIALYSIS: 1000.0000 [IU] | INTRAMUSCULAR | Status: DC | PRN

## 2016-12-23 MED ORDER — HEPARIN SODIUM (PORCINE) 1000 UNIT/ML IJ SOLN
INTRAMUSCULAR | Status: DC | PRN
  Administered 2016-12-23: 5000 [IU] via INTRAVENOUS

## 2016-12-23 MED ORDER — HEPARIN SODIUM (PORCINE) 5000 UNIT/ML IJ SOLN
INTRAMUSCULAR | Status: DC | PRN
  Administered 2016-12-23: 11:00:00

## 2016-12-23 MED ORDER — MIDAZOLAM HCL 2 MG/2ML IJ SOLN
INTRAMUSCULAR | Status: AC
  Filled 2016-12-23: qty 2

## 2016-12-23 MED ORDER — FENTANYL CITRATE (PF) 250 MCG/5ML IJ SOLN
INTRAMUSCULAR | Status: AC
  Filled 2016-12-23: qty 5

## 2016-12-23 MED ORDER — ALTEPLASE 2 MG IJ SOLR: 2.0000 mg | Freq: Once | INTRAMUSCULAR | Status: DC | PRN

## 2016-12-23 MED ORDER — 0.9 % SODIUM CHLORIDE (POUR BTL) OPTIME
TOPICAL | Status: DC | PRN
  Administered 2016-12-23: 1000 mL

## 2016-12-23 MED ORDER — FENTANYL CITRATE (PF) 100 MCG/2ML IJ SOLN
INTRAMUSCULAR | Status: DC | PRN
  Administered 2016-12-23 (×4): 25 ug via INTRAVENOUS
  Administered 2016-12-23: 50 ug via INTRAVENOUS
  Administered 2016-12-23 (×2): 25 ug via INTRAVENOUS

## 2016-12-23 MED ORDER — IOPAMIDOL (ISOVUE-300) INJECTION 61%
INTRAVENOUS | Status: AC
  Filled 2016-12-23: qty 50

## 2016-12-23 MED ORDER — LIDOCAINE HCL (PF) 1 % IJ SOLN
INTRAMUSCULAR | Status: AC
  Filled 2016-12-23: qty 30

## 2016-12-23 MED ORDER — MIDAZOLAM HCL 5 MG/5ML IJ SOLN
INTRAMUSCULAR | Status: DC | PRN
  Administered 2016-12-23: 2 mg via INTRAVENOUS

## 2016-12-23 MED ORDER — HEPARIN SODIUM (PORCINE) 1000 UNIT/ML IJ SOLN
INTRAMUSCULAR | Status: DC | PRN
  Administered 2016-12-23: 3400 [IU] via INTRA_ARTERIAL

## 2016-12-23 MED ORDER — PENTAFLUOROPROP-TETRAFLUOROETH EX AERO: 1.0000 "application " | INHALATION_SPRAY | CUTANEOUS | Status: DC | PRN

## 2016-12-23 MED ORDER — PHENYLEPHRINE HCL 10 MG/ML IJ SOLN
INTRAVENOUS | Status: DC | PRN
  Administered 2016-12-23: 25 ug/min via INTRAVENOUS

## 2016-12-23 MED ORDER — LIDOCAINE HCL (CARDIAC) 20 MG/ML IV SOLN
INTRAVENOUS | Status: AC
  Filled 2016-12-23: qty 5

## 2016-12-23 MED ORDER — EPHEDRINE 5 MG/ML INJ
INTRAVENOUS | Status: AC
  Filled 2016-12-23: qty 10

## 2016-12-23 MED ORDER — LIDOCAINE HCL (PF) 1 % IJ SOLN: 5.0000 mL | INTRAMUSCULAR | Status: DC | PRN

## 2016-12-23 MED ORDER — SODIUM CHLORIDE 0.9 % IV SOLN
510.0000 mg | Freq: Once | INTRAVENOUS | Status: DC
  Filled 2016-12-23: qty 17

## 2016-12-23 SURGICAL SUPPLY — 63 items
ARMBAND PINK RESTRICT EXTREMIT (MISCELLANEOUS) ×6 IMPLANT
BAG DECANTER FOR FLEXI CONT (MISCELLANEOUS) ×3 IMPLANT
BIOPATCH RED 1 DISK 7.0 (GAUZE/BANDAGES/DRESSINGS) ×3 IMPLANT
CANISTER SUCT 3000ML PPV (MISCELLANEOUS) ×3 IMPLANT
CANNULA VESSEL 3MM 2 BLNT TIP (CANNULA) ×3 IMPLANT
CATH PALINDROME RT-P 15FX19CM (CATHETERS) IMPLANT
CATH PALINDROME RT-P 15FX23CM (CATHETERS) ×3 IMPLANT
CATH PALINDROME RT-P 15FX28CM (CATHETERS) IMPLANT
CATH PALINDROME RT-P 15FX55CM (CATHETERS) IMPLANT
CATH STRAIGHT 5FR 65CM (CATHETERS) IMPLANT
CHLORAPREP W/TINT 26ML (MISCELLANEOUS) ×3 IMPLANT
COVER PROBE W GEL 5X96 (DRAPES) ×3 IMPLANT
COVER SURGICAL LIGHT HANDLE (MISCELLANEOUS) ×3 IMPLANT
DECANTER SPIKE VIAL GLASS SM (MISCELLANEOUS) ×3 IMPLANT
DERMABOND ADVANCED (GAUZE/BANDAGES/DRESSINGS) ×2
DERMABOND ADVANCED .7 DNX12 (GAUZE/BANDAGES/DRESSINGS) ×4 IMPLANT
DRAIN PENROSE 1/4X12 LTX STRL (WOUND CARE) ×3 IMPLANT
DRAPE C-ARM 42X72 X-RAY (DRAPES) ×3 IMPLANT
DRAPE CHEST BREAST 15X10 FENES (DRAPES) ×3 IMPLANT
ELECT REM PT RETURN 9FT ADLT (ELECTROSURGICAL) ×3
ELECTRODE REM PT RTRN 9FT ADLT (ELECTROSURGICAL) ×2 IMPLANT
GAUZE SPONGE 4X4 16PLY XRAY LF (GAUZE/BANDAGES/DRESSINGS) ×3 IMPLANT
GLOVE BIO SURGEON STRL SZ7.5 (GLOVE) ×3 IMPLANT
GLOVE BIOGEL PI IND STRL 6.5 (GLOVE) ×4 IMPLANT
GLOVE BIOGEL PI IND STRL 7.0 (GLOVE) ×10 IMPLANT
GLOVE BIOGEL PI IND STRL 7.5 (GLOVE) ×2 IMPLANT
GLOVE BIOGEL PI INDICATOR 6.5 (GLOVE) ×2
GLOVE BIOGEL PI INDICATOR 7.0 (GLOVE) ×5
GLOVE BIOGEL PI INDICATOR 7.5 (GLOVE) ×1
GLOVE ECLIPSE 7.0 STRL STRAW (GLOVE) ×3 IMPLANT
GLOVE ECLIPSE 7.5 STRL STRAW (GLOVE) ×3 IMPLANT
GOWN STRL REUS W/ TWL LRG LVL3 (GOWN DISPOSABLE) ×16 IMPLANT
GOWN STRL REUS W/ TWL XL LVL3 (GOWN DISPOSABLE) ×2 IMPLANT
GOWN STRL REUS W/TWL LRG LVL3 (GOWN DISPOSABLE) ×8
GOWN STRL REUS W/TWL XL LVL3 (GOWN DISPOSABLE) ×1
KIT BASIN OR (CUSTOM PROCEDURE TRAY) ×3 IMPLANT
KIT ROOM TURNOVER OR (KITS) ×3 IMPLANT
LOOP VESSEL MINI RED (MISCELLANEOUS) IMPLANT
NEEDLE 18GX1X1/2 (RX/OR ONLY) (NEEDLE) ×3 IMPLANT
NEEDLE HYPO 25GX1X1/2 BEV (NEEDLE) ×3 IMPLANT
NS IRRIG 1000ML POUR BTL (IV SOLUTION) ×3 IMPLANT
PACK CV ACCESS (CUSTOM PROCEDURE TRAY) ×3 IMPLANT
PACK SURGICAL SETUP 50X90 (CUSTOM PROCEDURE TRAY) ×3 IMPLANT
PAD ARMBOARD 7.5X6 YLW CONV (MISCELLANEOUS) ×6 IMPLANT
SET MICROPUNCTURE 5F STIFF (MISCELLANEOUS) IMPLANT
SPONGE SURGIFOAM ABS GEL 100 (HEMOSTASIS) IMPLANT
SUT ETHILON 3 0 PS 1 (SUTURE) ×6 IMPLANT
SUT PROLENE 7 0 BV 1 (SUTURE) ×6 IMPLANT
SUT SILK 0 FSL (SUTURE) IMPLANT
SUT SILK 3 0 (SUTURE) ×1
SUT SILK 3-0 18XBRD TIE 12 (SUTURE) ×2 IMPLANT
SUT VIC AB 3-0 SH 27 (SUTURE) ×1
SUT VIC AB 3-0 SH 27X BRD (SUTURE) ×2 IMPLANT
SUT VICRYL 4-0 PS2 18IN ABS (SUTURE) ×3 IMPLANT
SYR 10ML LL (SYRINGE) ×3 IMPLANT
SYR 20CC LL (SYRINGE) ×6 IMPLANT
SYR 5ML LL (SYRINGE) ×3 IMPLANT
SYR CONTROL 10ML LL (SYRINGE) ×3 IMPLANT
TOWEL OR 17X24 6PK STRL BLUE (TOWEL DISPOSABLE) ×3 IMPLANT
TOWEL OR 17X26 10 PK STRL BLUE (TOWEL DISPOSABLE) ×3 IMPLANT
UNDERPAD 30X30 (UNDERPADS AND DIAPERS) ×3 IMPLANT
WATER STERILE IRR 1000ML POUR (IV SOLUTION) ×3 IMPLANT
WIRE AMPLATZ SS-J .035X180CM (WIRE) IMPLANT

## 2016-12-23 NOTE — Progress Notes (Signed)
[facility] Progress Note    Assessment/ Plan:   1. Non-oliguric AKI/CKD4vs progressive CKD. Scr was 3.32 in March 2017 and did not have any follow up with her PCP since. She has been seen by Dr. Lorrene Reid in our practice, however has not followed up in a year. 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 Her primary nephrologist Dr. Patrice Paradise she is in denial of the severity of her condition and not sure she will follow up once stable for discharge. I discussed the need to proceed with vein mapping and vascular access placement as well as education and preparation to initiate dialysis. I also stressedcompliance with BP meds.  - She is now willing to have access placed; appreciate Dr. Donzetta Matters with [facility] providing access services. - She is not uremic but is not showing much signs of renal improvement; therefore will initiate HD in the hospital after cath is placed by Dr. Donzetta Matters. - Plan for HD only 2 hrs today - Need to CLIP 2. Hypertensive urgency- MRA reveals multiple renal arteries, however no stenosis in main arteries (2 renal arteries to left kidney with stenosis involving the accessory and 3 renal arteries to the right kidney without stenosis).  3. Acute diastolic CHF- improved with diuresis 4. Hyponatremia- due to CKD and CHF 5. Anemia of CKD- will check iron stores and start ESA 6. SHPTH- will follow ca/phos/iPTH and treat accordingly 7. Nutrition- renal diet 8. DM- per primary 9. Vascular access- vein mapping and access placement pending now that she is amenable  Subjective:   Doing better and denies dyspnea, cp, fever, chills. Currently NPO awaiting surgery.   Objective:   BP (!) 115/41 (BP Location: Right Arm)   Pulse (!) 57   Temp 97.9 F (36.6 C) (Oral)   Resp 18   Ht 5\' 10"  (1.778 m)   Wt 102.6 kg (226 lb 1.6 oz)   SpO2 98%   BMI 32.44 kg/m   Intake/Output Summary (Last 24 hours) at 12/23/16 0754 Last data filed at  12/22/16 1820  Gross per 24 hour  Intake              480 ml  Output                1 ml  Net              479 ml   Weight change: 0.272 kg (9.6 oz)  Physical Exam: Gen: NAD, pleasant CVS:no rub, RRR Resp:cta no rales PTW:SFKCLE Ext:tr edema  Imaging: No results found.  Labs: BMET  Recent Labs Lab 12/16/16 1325 12/17/16 0825 12/19/16 0732 12/20/16 0308 12/21/16 0320 12/22/16 0609 12/23/16 0430  NA 133* 136 133* 133* 136 136 133*  K 5.3* 4.2 4.5 4.7 4.7 4.6 4.7  CL 107 106 105 104 105 107 107  CO2 15* 18* 17* 18* 16* 19* 16*  GLUCOSE 140* 128* 156* 179* 144* 124* 167*  BUN 82* 82* 85* 90* 88* 93* 100*  CREATININE 6.85* 6.66* 6.57* 6.90* 6.60* 6.85* 7.17*  CALCIUM 9.1 9.0 9.0 9.0 9.4 9.1 9.2  PHOS 7.0*  --   --   --   --   --  5.9*   CBC  Recent Labs Lab 12/20/16 1239 12/23/16 0430  WBC 7.5 6.8  HGB 9.3* 8.5*  HCT 30.4* 27.0*  MCV 70.9* 71.6*  PLT 262 200    Medications:    . aspirin  81 mg Oral Daily  . calcium acetate  1,334 mg Oral TID WC  . [START ON 12/24/2016] darbepoetin (ARANESP) injection - DIALYSIS  60 mcg Intravenous Q Sat-HD  . insulin aspart  0-15 Units Subcutaneous TID WC  . insulin aspart  0-5 Units Subcutaneous QHS  . insulin aspart  8 Units Subcutaneous TID WC  . insulin glargine  25 Units Subcutaneous QHS  . isosorbide-hydrALAZINE  2 tablet Oral TID  . mouth rinse  15 mL Mouth Rinse BID  . metoprolol tartrate  50 mg Oral BID  . pravastatin  40 mg Oral Daily  . sodium chloride flush  3 mL Intravenous Q12H      [name], MD 12/23/2016, 7:54 AM

## 2016-12-23 NOTE — H&P (View-Only) (Signed)
  Progress Note    12/23/2016 8:20 AM * No surgery date entered *  Subjective:  No complaints  Vitals:   12/22/16 2128 12/23/16 0638  BP: (!) 163/63 (!) 115/41  Pulse: 66 (!) 57  Resp:    Temp: 98 F (36.7 C) 97.9 F (36.6 C)    Physical Exam: aaox3 Non labored respirations 2+ left radial pulse  CBC    Component Value Date/Time   WBC 6.8 12/23/2016 0430   RBC 3.77 (L) 12/23/2016 0430   HGB 8.5 (L) 12/23/2016 0430   HCT 27.0 (L) 12/23/2016 0430   PLT 200 12/23/2016 0430   MCV 71.6 (L) 12/23/2016 0430   MCH 22.5 (L) 12/23/2016 0430   MCHC 31.5 12/23/2016 0430   RDW 15.4 12/23/2016 0430   LYMPHSABS 1.2 12/10/2016 0322   MONOABS 1.1 (H) 12/10/2016 0322   EOSABS 0.0 12/10/2016 0322   BASOSABS 0.0 12/10/2016 0322    BMET    Component Value Date/Time   NA 133 (L) 12/23/2016 0430   K 4.7 12/23/2016 0430   CL 107 12/23/2016 0430   CO2 16 (L) 12/23/2016 0430   GLUCOSE 167 (H) 12/23/2016 0430   BUN 100 (H) 12/23/2016 0430   CREATININE 7.17 (H) 12/23/2016 0430   CALCIUM 9.2 12/23/2016 0430   GFRNONAA 5 (L) 12/23/2016 0430   GFRAA 6 (L) 12/23/2016 0430    INR    Component Value Date/Time   INR 1.03 12/16/2016 0335     Intake/Output Summary (Last 24 hours) at 12/23/16 0820 Last data filed at 12/22/16 1820  Gross per 24 hour  Intake              360 ml  Output                1 ml  Net              359 ml     Assessment:  66 y.o. female is in need of hd access  Plan: OR today for tdc and left arm avf vs avg Will move iv to right arm and remove left forearm access in preparation for surgery She had multiple questions that were answered regarding access.   Leata Dominy C. Donzetta Matters, MD [facility] [place] Office: [phone] Pager: [phone]  12/23/2016 8:20 AM

## 2016-12-23 NOTE — Progress Notes (Signed)
  Progress Note    12/23/2016 8:20 AM * No surgery date entered *  Subjective:  No complaints  Vitals:   12/22/16 2128 12/23/16 0638  BP: (!) 163/63 (!) 115/41  Pulse: 66 (!) 57  Resp:    Temp: 98 F (36.7 C) 97.9 F (36.6 C)    Physical Exam: aaox3 Non labored respirations 2+ left radial pulse  CBC    Component Value Date/Time   WBC 6.8 12/23/2016 0430   RBC 3.77 (L) 12/23/2016 0430   HGB 8.5 (L) 12/23/2016 0430   HCT 27.0 (L) 12/23/2016 0430   PLT 200 12/23/2016 0430   MCV 71.6 (L) 12/23/2016 0430   MCH 22.5 (L) 12/23/2016 0430   MCHC 31.5 12/23/2016 0430   RDW 15.4 12/23/2016 0430   LYMPHSABS 1.2 12/10/2016 0322   MONOABS 1.1 (H) 12/10/2016 0322   EOSABS 0.0 12/10/2016 0322   BASOSABS 0.0 12/10/2016 0322    BMET    Component Value Date/Time   NA 133 (L) 12/23/2016 0430   K 4.7 12/23/2016 0430   CL 107 12/23/2016 0430   CO2 16 (L) 12/23/2016 0430   GLUCOSE 167 (H) 12/23/2016 0430   BUN 100 (H) 12/23/2016 0430   CREATININE 7.17 (H) 12/23/2016 0430   CALCIUM 9.2 12/23/2016 0430   GFRNONAA 5 (L) 12/23/2016 0430   GFRAA 6 (L) 12/23/2016 0430    INR    Component Value Date/Time   INR 1.03 12/16/2016 0335     Intake/Output Summary (Last 24 hours) at 12/23/16 0820 Last data filed at 12/22/16 1820  Gross per 24 hour  Intake              360 ml  Output                1 ml  Net              359 ml     Assessment:  66 y.o. female is in need of hd access  Plan: OR today for tdc and left arm avf vs avg Will move iv to right arm and remove left forearm access in preparation for surgery She had multiple questions that were answered regarding access.   Brandon C. Donzetta Matters, MD [facility] [place] Office: [phone] Pager: [phone]  12/23/2016 8:20 AM

## 2016-12-23 NOTE — Transfer of Care (Signed)
Immediate Anesthesia Transfer of Care Note  Patient: [name]  Procedure(s) Performed: Procedure(s): LEFT UPPER EXTREMITY ARTERIOVENOUS (AV) FISTULA CREATION (Left) INSERTION OF DIALYSIS CATHETER (Right)  Patient Location: PACU  Anesthesia Type:General  Level of Consciousness: oriented, drowsy and patient cooperative  Airway & Oxygen Therapy: Patient Spontanous Breathing and Patient connected to face mask oxygen  Post-op Assessment: Report given to RN and Post -op Vital signs reviewed and stable  Post vital signs: Reviewed and stable  Last Vitals:  Vitals:   12/22/16 2128 12/23/16 0638  BP: (!) 163/63 (!) 115/41  Pulse: 66 (!) 57  Resp:    Temp: 36.7 C 36.6 C    Last Pain:  Vitals:   12/23/16 0830  TempSrc:   PainSc: 0-No pain      Patients Stated Pain Goal: 0 (31/42/76 7011)  Complications: No apparent anesthesia complications

## 2016-12-23 NOTE — Progress Notes (Signed)
Patient arrived to HD unit tearful.  This RN spoke with patient at some length and determined that she was having a difficult time processing the events of her hospital stay.  Education was volunteered.  Dr. Augustin Coupe notified of patient status and agreeable to initiate first HD treatment in AM of 7/7, as HD already planned in addition to this evenings treatment.  Will continue to monitor.

## 2016-12-23 NOTE — Anesthesia Procedure Notes (Signed)
Procedure Name: LMA Insertion Date/Time: 12/23/2016 10:24 AM Performed by: Everlean Cherry A Pre-anesthesia Checklist: Patient identified, Emergency Drugs available, Suction available and Patient being monitored Patient Re-evaluated:Patient Re-evaluated prior to inductionOxygen Delivery Method: Circle system utilized Preoxygenation: Pre-oxygenation with 100% oxygen Intubation Type: IV induction Ventilation: Mask ventilation with difficulty LMA: LMA inserted LMA Size: 4.0 Placement Confirmation: positive ETCO2 and breath sounds checked- equal and bilateral Tube secured with: Tape Dental Injury: Teeth and Oropharynx as per pre-operative assessment

## 2016-12-23 NOTE — Anesthesia Postprocedure Evaluation (Addendum)
Anesthesia Post Note  Patient: BELEM HINTZE  Procedure(s) Performed: Procedure(s) (LRB): LEFT UPPER EXTREMITY ARTERIOVENOUS (AV) FISTULA CREATION (Left) INSERTION OF DIALYSIS CATHETER (Right)     Patient location during evaluation: PACU Anesthesia Type: General Level of consciousness: awake and alert and oriented Pain management: pain level controlled Vital Signs Assessment: post-procedure vital signs reviewed and stable Respiratory status: spontaneous breathing, nonlabored ventilation and respiratory function stable Cardiovascular status: stable and blood pressure returned to baseline Postop Assessment: no signs of nausea or vomiting Anesthetic complications: no    Last Vitals:  Vitals:   12/23/16 1240 12/23/16 1245  BP:  (!) 114/49  Pulse: 62 62  Resp: 12 12  Temp:      Last Pain:  Vitals:   12/23/16 1230  TempSrc:   PainSc: Asleep                 Gerardine Peltz A.

## 2016-12-23 NOTE — Progress Notes (Signed)
[facility] TEAM 1 - Stepdown/ICU TEAM  RAYNA BRENNER  KDX:[id number] DOB: 1950-12-31 DOA: 12/09/2016 PCP: Everardo Beals, NP    Brief Narrative:  66 y.o.F Hx DM, HTN, and CKD IV who presented with acute dyspnea. She summoned EMS, who found her SpO2 to be 70% and BP 250/140.  Subjective: The patient is resting comfortably in her room post vascular surgery today.  Her affect is flat but she denies any complaints at this time.   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 Baseline crt 2.67 as of Aug 2017 - Nephrology following - s/p vascular surgery for access today   Recent Labs Lab 12/19/16 0732 12/20/16 0308 12/21/16 0320 12/22/16 0609 12/23/16 0430  CREATININE 6.57* 6.90* 6.60* 6.85* 7.17*    Recent Labs Lab 12/19/16 0732 12/20/16 0308 12/21/16 0320 12/22/16 0609 12/23/16 0430  BUN 85* 90* 88* 93* 100*    DM2 CBG controlled at present   Elevated troponin  Most c/w demand ischemia in setting of HTN emergency - no WMA on TTE - no indication for further inpt w/u   DVT prophylaxis: Lovenox Code Status: FULL CODE Family Communication: spoke w/ family at bedside  Disposition Plan: may require HD start while still inpt  - following   Consultants:  Nephrology   Procedures: None  Antimicrobials:  None  Objective: Blood pressure (!) 112/55, pulse 63, temperature 97.6 F (36.4 C), resp. rate 14, height 5\' 10"  (1.778 m), weight 102.6 kg (226 lb 1.6 oz), SpO2 99 %.  Intake/Output Summary (Last 24 hours) at 12/23/16 1713 Last data filed at 12/23/16 1300  Gross per 24 hour  Intake              910 ml  Output               25 ml  Net              885 ml   Filed Weights   12/21/16 0500  12/22/16 0500 12/23/16 0638  Weight: 100.1 kg (220 lb 10.9 oz) 102.3 kg (225 lb 8 oz) 102.6 kg (226 lb 1.6 oz)    Examination: General: No acute distress - flat affect Lungs: Clear to auscultation throughout  Cardiovascular: RRR - no rub  CBC:  Recent Labs Lab 12/20/16 1239 12/23/16 0430  WBC 7.5 6.8  HGB 9.3* 8.5*  HCT 30.4* 27.0*  MCV 70.9* 71.6*  PLT 262 341   Basic Metabolic Panel:  Recent Labs Lab 12/17/16 0825 12/19/16 0732 12/20/16 0308 12/21/16 0320 12/22/16 0609 12/23/16 0430  NA 136 133* 133* 136 136 133*  K 4.2 4.5 4.7 4.7 4.6 4.7  CL 106 105 104 105 107 107  CO2 18* 17* 18* 16* 19* 16*  GLUCOSE 128* 156* 179* 144* 124* 167*  BUN 82* 85* 90* 88* 93* 100*  CREATININE 6.66* 6.57* 6.90* 6.60* 6.85* 7.17*  CALCIUM 9.0 9.0 9.0 9.4 9.1 9.2  MG 2.3 2.3  --   --   --   --   PHOS  --   --   --   --   --  5.9*   GFR: Estimated Creatinine Clearance: 10.4 mL/min (A) (by C-G formula based  on SCr of 7.17 mg/dL (H)).  Liver Function Tests:  Recent Labs Lab 12/23/16 0430  ALBUMIN 2.9*   CBG:  Recent Labs Lab 12/23/16 0731 12/23/16 0849 12/23/16 1254 12/23/16 1318 12/23/16 1607  GLUCAP 141* 150* 139* 143* 110*    Scheduled Meds: . aspirin  81 mg Oral Daily  . calcium acetate  1,334 mg Oral TID WC  . [START ON 12/24/2016] darbepoetin (ARANESP) injection - DIALYSIS  60 mcg Intravenous Q Sat-HD  . insulin aspart  0-15 Units Subcutaneous TID WC  . insulin aspart  0-5 Units Subcutaneous QHS  . insulin aspart  8 Units Subcutaneous TID WC  . insulin glargine  25 Units Subcutaneous QHS  . isosorbide-hydrALAZINE  2 tablet Oral TID  . mouth rinse  15 mL Mouth Rinse BID  . metoprolol tartrate  50 mg Oral BID  . pravastatin  40 mg Oral Daily  . sodium chloride flush  3 mL Intravenous Q12H     LOS: 14 days   Cherene Altes, MD [facility] Office  [phone] Pager - Text Page per Amion as per below:  On-Call/Text Page:      [email or web address]       password [email or web address]  If 7PM-7AM, please contact night-coverage [email or web address] Password [email or web address] 12/23/2016, 5:13 PM

## 2016-12-23 NOTE — Anesthesia Preprocedure Evaluation (Addendum)
Anesthesia Evaluation  Patient identified by MRN, date of birth, ID band Patient awake    Reviewed: Allergy & Precautions, NPO status , Patient's Chart, lab work & pertinent test results, reviewed documented beta blocker date and time   Airway Mallampati: III  TM Distance: >3 FB Neck ROM: Full    Dental  (+) Poor Dentition, Missing, Chipped,    Pulmonary neg pulmonary ROS,    Pulmonary exam normal breath sounds clear to auscultation       Cardiovascular hypertension, Pt. on medications and Pt. on home beta blockers Normal cardiovascular exam Rhythm:Regular Rate:Normal     Neuro/Psych PSYCHIATRIC DISORDERS Depression negative neurological ROS     GI/Hepatic negative GI ROS, Neg liver ROS,   Endo/Other  diabetes, Well Controlled, Type 2, Oral Hypoglycemic AgentsMorbid obesity  Renal/GU ESRF and DialysisRenal disease  negative genitourinary   Musculoskeletal negative musculoskeletal ROS (+)   Abdominal (+) + obese,   Peds  Hematology  (+) anemia ,   Anesthesia Other Findings   Reproductive/Obstetrics                            Anesthesia Physical Anesthesia Plan  ASA: IV  Anesthesia Plan: General   Post-op Pain Management:    Induction: Intravenous  PONV Risk Score and Plan: 3 and Ondansetron, Propofol and Midazolam  Airway Management Planned: LMA  Additional Equipment:   Intra-op Plan:   Post-operative Plan: Extubation in OR  Informed Consent: I have reviewed the patients History and Physical, chart, labs and discussed the procedure including the risks, benefits and alternatives for the proposed anesthesia with the patient or authorized representative who has indicated his/her understanding and acceptance.   Dental advisory given  Plan Discussed with: CRNA, Anesthesiologist and Surgeon  Anesthesia Plan Comments:       Anesthesia Quick Evaluation

## 2016-12-23 NOTE — Op Note (Addendum)
Procedure: Ultrasound-guided insertion of Palindrome 23 cm catheter, left brachial cephalic fistula  Preoperative diagnosis: End-stage renal disease  Postoperative diagnosis: Same  Asst: Gerri Lins PA-c  Anesthesia: General  Operative findings: 23 cm Diatek catheter right internal jugular vein  Operative details: After obtaining informed consent, the patient was taken to the operating room. The patient was placed in supine position on the operating room table. After adequate sedation the patient's entire neck and chest were prepped and draped in usual sterile fashion. The patient was placed in Trendelenburg position. Ultrasound was used to identify the patient's right internal jugular vein. This had normal compressibility and respiratory variation. Using ultrasound guidance, the right internal jugular vein was successfully cannulated.  A 0.035 J-tipped guidewire was threaded into the right internal jugular vein and into the superior vena cava followed by the inferior vena cava under fluoroscopic guidance.   Next sequential 12 and 14 dilators were placed over the guidewire into the right atrium.  A 16 French dilator with a peel-away sheath was then placed over the guidewire into the right atrium.   The guidewire and dilator were removed. A 23 cm Palindrome catheter was then placed through the peel away sheath into the right atrium.  The catheter was then tunneled subcutaneously, cut to length, and the hub attached. The catheter was noted to flush and draw easily. The catheter was inspected under fluoroscopy and found with its tip to be in the right atrium without any kinks throughout its course. The catheter was sutured to the skin with nylon sutures. The neck insertion site was closed with Vicryl stitch. The catheter was then loaded with concentrated Heparin solution. A dry sterile dressing was applied.  Next, the left upper extremity was prepped and draped in usual sterile fashion.  A transverse  incision was then made near the antecubital crease the left arm. The incision was carried into the subcutaneous tissues down to level of the cephalic vein. The cephalic vein was approximately 3.5 mm in diameter. It was of good quality. This was dissected free circumferentially and small side branches ligated and divided between silk ties or clips. Next the brachial artery was dissected free in the medial portion of the incision. The artery was  3 mm in diameter. The vessel loops were placed proximal and distal to the planned site of arteriotomy. The patient was given 5000 units of intravenous heparin. After appropriate circulation time, the vessel loops were used to control the artery. A longitudinal opening was made in the brachial artery.  The vein was ligated distally with a 2-0 silk tie. The vein was controlled proximally with a fine bulldog clamp. The vein was then swung over to the artery and sewn end of vein to side of artery using a running 7-0 Prolene suture. Just prior to completion of the anastomosis, everything was fore bled back bled and thoroughly flushed. The anastomosis was secured, vessel loops released, and there was a palpable thrill in the fistula immediately. After hemostasis was obtained, the subcutaneous tissues were reapproximated using a running 3-0 Vicryl suture. The skin was then closed with a 4 0 Vicryl subcuticular stitch. Dermabond was applied to the skin incision.  The patient had a palpable radial pulse at the end of the case.  The patient tolerated procedure well and there were no complications. Instrument sponge and needle counts correct in the case. The patient was taken to the recovery room in stable condition. Chest x-ray will be obtained in the recovery room.  Juanda Crumble  Terril Amaro, MD [facility] Office: [phone] Pager: [phone]

## 2016-12-23 NOTE — Interval H&P Note (Signed)
History and Physical Interval Note:  12/23/2016 9:00 AM  Phyllis Hernandez  has presented today for surgery, with the diagnosis of Chronic Kidney Disease  The various methods of treatment have been discussed with the patient and family. After consideration of risks, benefits and other options for treatment, the patient has consented to  Procedure(s): UPPER EXTREMITY ARTERIOVENOUS (AV) FISTULA CREATION VERSUS INSERTION OF ARTERIOVENOUS GORTEX GRAFT (N/A) INSERTION OF DIALYSIS CATHETER (N/A) as a surgical intervention .  The patient's history has been reviewed, patient examined, no change in status, stable for surgery.  I have reviewed the patient's chart and labs.  Questions were answered to the patient's satisfaction.     Ruta Hinds

## 2016-12-24 ENCOUNTER — Encounter (HOSPITAL_COMMUNITY): Payer: Self-pay | Admitting: Vascular Surgery

## 2016-12-24 LAB — CBC
HCT: 30.7 % — ABNORMAL LOW (ref 36.0–46.0)
Hemoglobin: 9.2 g/dL — ABNORMAL LOW (ref 12.0–15.0)
MCH: 21.5 pg — ABNORMAL LOW (ref 26.0–34.0)
MCHC: 30 g/dL (ref 30.0–36.0)
MCV: 71.7 fL — ABNORMAL LOW (ref 78.0–100.0)
PLATELETS: 179 10*3/uL (ref 150–400)
RBC: 4.28 MIL/uL (ref 3.87–5.11)
RDW: 15.7 % — ABNORMAL HIGH (ref 11.5–15.5)
WBC: 9.9 10*3/uL (ref 4.0–10.5)

## 2016-12-24 LAB — RENAL FUNCTION PANEL
Albumin: 3.2 g/dL — ABNORMAL LOW (ref 3.5–5.0)
Anion gap: 11 (ref 5–15)
BUN: 93 mg/dL — ABNORMAL HIGH (ref 6–20)
CALCIUM: 9.1 mg/dL (ref 8.9–10.3)
CO2: 18 mmol/L — ABNORMAL LOW (ref 22–32)
CREATININE: 7.06 mg/dL — AB (ref 0.44–1.00)
Chloride: 104 mmol/L (ref 101–111)
GFR, EST AFRICAN AMERICAN: 6 mL/min — AB (ref >60)
GFR, EST NON AFRICAN AMERICAN: 6 mL/min — AB (ref >60)
Glucose, Bld: 231 mg/dL — ABNORMAL HIGH (ref 65–99)
Phosphorus: 5.9 mg/dL — ABNORMAL HIGH (ref 2.5–4.6)
Potassium: 5.7 mmol/L — ABNORMAL HIGH (ref 3.5–5.1)
SODIUM: 133 mmol/L — AB (ref 135–145)

## 2016-12-24 LAB — GLUCOSE, CAPILLARY
GLUCOSE-CAPILLARY: 106 mg/dL — AB (ref 65–99)
GLUCOSE-CAPILLARY: 202 mg/dL — AB (ref 65–99)
Glucose-Capillary: 160 mg/dL — ABNORMAL HIGH (ref 65–99)
Glucose-Capillary: 140 mg/dL — ABNORMAL HIGH (ref 65–99)

## 2016-12-24 LAB — ALT: ALT: 16 U/L (ref 14–54)

## 2016-12-24 MED ORDER — DARBEPOETIN ALFA 60 MCG/0.3ML IJ SOSY
PREFILLED_SYRINGE | INTRAMUSCULAR | Status: AC
  Filled 2016-12-24: qty 0.3

## 2016-12-24 MED ORDER — HEPARIN SODIUM (PORCINE) 5000 UNIT/ML IJ SOLN
5000.0000 [IU] | Freq: Three times a day (TID) | INTRAMUSCULAR | Status: DC
  Administered 2016-12-24 – 2016-12-27 (×10): 5000 [IU] via SUBCUTANEOUS
  Filled 2016-12-24 (×9): qty 1

## 2016-12-24 NOTE — Plan of Care (Signed)
Problem: Education: Goal: Knowledge of [facility] Education information/materials will improve Outcome: Progressing Patient aware of plan of care.  RN provided medication education on all medications administered thus far this shift prior to administration.  Patient stated understanding.  Patient originally denied pain and then complained of left arm soreness.  RN offered PRN Tylenol but patient refused stating she is already taking so many medications.

## 2016-12-24 NOTE — Progress Notes (Addendum)
[facility]  Subjective  - Doing OK   Objective 135/62 68 (!) 97.4 F (36.3 C) (Oral) 18 99%  Intake/Output Summary (Last 24 hours) at 12/24/16 3817 Last data filed at 12/24/16 0830  Gross per 24 hour  Intake              670 ml  Output             1525 ml  Net             -855 ml    Palpable thrill in fistula at anastomosis Radial pulse palpable with grip 5/5 left  Assessment/Planning: POD # 1 Procedure: Ultrasound-guided insertion of Palindrome 23 cm catheter, left brachial cephalic fistula  F/U in our office in 6 weeks with Dr. Carvel Getting, EMMA [facility] 12/24/2016 9:37 AM -- Some mild numbness in hand.  Discussed with pt to call me if this worsens over the next few days Agree with above Will sign off  Ruta Hinds, MD [facility]: [phone] Pager: [phone]  Laboratory Lab Results:  Recent Labs  12/23/16 0430 12/23/16 2332  WBC 6.8 9.9  HGB 8.5* 9.2*  HCT 27.0* 30.7*  PLT 200 179   BMET  Recent Labs  12/23/16 0430 12/23/16 2332  NA 133* 133*  K 4.7 5.7*  CL 107 104  CO2 16* 18*  GLUCOSE 167* 231*  BUN 100* 93*  CREATININE 7.17* 7.06*  CALCIUM 9.2 9.1    COAG Lab Results  Component Value Date   INR 1.03 12/16/2016   INR 1.01 12/15/2016   INR 1.0 04/17/2008   No results found for: PTT

## 2016-12-24 NOTE — Progress Notes (Addendum)
[name] TEAM 1 - Stepdown/ICU TEAM  Phyllis Hernandez  OQH:[id number] DOB: [birth year] DOA: 12/09/2016 PCP: Everardo Beals, NP    Brief Narrative:  66 y.o.F Hx DM, HTN, and CKD IV who presented with acute dyspnea. She summoned EMS, who found her SpO2 to be 70% and BP 250/140.  Subjective: The patient is resting comfortably in bed post her first dialysis treatment.  She states she tolerated it well.  She denies chest pain nausea or vomiting.  Her affect remains quite flat.  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 Baseline crt 2.67 as of Aug 2017 - Nephrology following - s/p vascular surgery for access 7/6 - dialysis initiated today with plan for another treatment on Monday  Recent Labs Lab 12/20/16 0308 12/21/16 0320 12/22/16 0609 12/23/16 0430 12/23/16 2332  CREATININE 6.90* 6.60* 6.85* 7.17* 7.06*    Recent Labs Lab 12/20/16 0308 12/21/16 0320 12/22/16 0609 12/23/16 0430 12/23/16 2332  BUN 90* 88* 93* 100* 93*    DM2 CBG well controlled at present   Elevated troponin  Most c/w demand ischemia in setting of HTN emergency - no WMA on TTE - no indication for further inpt w/u   DVT prophylaxis: SQ heparin Code Status: FULL CODE Family Communication: No family present at time of exam today Disposition Plan: Discharge when cleared for same by nephrology - will need to complete process to arrange for outpatient dialysis  Consultants:  Nephrology  Vasc Surgery   Procedures: 7/6 23 cm Diatek catheter right internal jugular vein + left brachial cephalic fistula   Antimicrobials:  None  Objective: Blood pressure 135/62, pulse 72, temperature 98.4 F (36.9 C), temperature source Oral,  resp. rate 18, height 5\' 10"  (1.778 m), weight 101 kg (222 lb 10.6 oz), SpO2 100 %.  Intake/Output Summary (Last 24 hours) at 12/24/16 1113 Last data filed at 12/24/16 0830  Gross per 24 hour  Intake              670 ml  Output             1510 ml  Net             -840 ml   Filed Weights   12/24/16 0438 12/24/16 0518 12/24/16 0800  Weight: 102.9 kg (226 lb 12.8 oz) 102.5 kg (225 lb 15.5 oz) 101 kg (222 lb 10.6 oz)    Examination: General: No acute distress - flat affect persists  Lungs: CTA w/o wheezing or crackles  Cardiovascular: RRR   CBC:  Recent Labs Lab 12/20/16 1239 12/23/16 0430 12/23/16 2332  WBC 7.5 6.8 9.9  HGB 9.3* 8.5* 9.2*  HCT 30.4* 27.0* 30.7*  MCV 70.9* 71.6* 71.7*  PLT 262 200 546   Basic Metabolic Panel:  Recent Labs Lab 12/19/16 0732 12/20/16 0308 12/21/16 0320 12/22/16 0609 12/23/16 0430 12/23/16 2332  NA 133* 133* 136 136 133* 133*  K 4.5 4.7 4.7 4.6 4.7 5.7*  CL 105 104 105 107 107 104  CO2 17* 18* 16* 19* 16* 18*  GLUCOSE 156* 179* 144* 124* 167* 231*  BUN 85* 90* 88* 93* 100* 93*  CREATININE 6.57* 6.90* 6.60* 6.85* 7.17* 7.06*  CALCIUM 9.0  9.0 9.4 9.1 9.2 9.1  MG 2.3  --   --   --   --   --   PHOS  --   --   --   --  5.9* 5.9*   GFR: Estimated Creatinine Clearance: 10.5 mL/min (A) (by C-G formula based on SCr of 7.06 mg/dL (H)).  Liver Function Tests:  Recent Labs Lab 12/23/16 0430 12/23/16 2332  ALT  --  16  ALBUMIN 2.9* 3.2*   CBG:  Recent Labs Lab 12/23/16 1254 12/23/16 1318 12/23/16 1607 12/23/16 2152 12/24/16 0916  GLUCAP 139* 143* 110* 145* 106*    Scheduled Meds: . aspirin  81 mg Oral Daily  . calcium acetate  1,334 mg Oral TID WC  . darbepoetin (ARANESP) injection - DIALYSIS  60 mcg Intravenous Q Sat-HD  . insulin aspart  0-15 Units Subcutaneous TID WC  . insulin aspart  0-5 Units Subcutaneous QHS  . insulin aspart  8 Units Subcutaneous TID WC  . insulin glargine  25 Units Subcutaneous QHS  .  isosorbide-hydrALAZINE  2 tablet Oral TID  . mouth rinse  15 mL Mouth Rinse BID  . metoprolol tartrate  50 mg Oral BID  . pravastatin  40 mg Oral Daily  . sodium chloride flush  3 mL Intravenous Q12H     LOS: 15 days   Cherene Altes, MD [facility] Office  [phone] Pager - Text Page per Amion as per below:  On-Call/Text Page:      [email or web address]      password [email or web address]  If 7PM-7AM, please contact night-coverage [email or web address] Password [email or web address] 12/24/2016, 11:13 AM

## 2016-12-24 NOTE — Progress Notes (Signed)
[facility] Progress Note    Assessment/ Plan:   1. Non-oliguric AKI/CKD4vs progressive CKD. Scr was 3.32 in March 2017 and did not have any follow up with her PCP since. She has been seen by Dr. Lorrene Reid in our practice, however has not followed up in a year. 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 Her primary nephrologist Dr. Patrice Paradise she is in denial of the severity of her condition and not sure she will follow up once stable for discharge. I discussed the need to proceed with vein mapping and vascular access placement as well as education and preparation to initiate dialysis. I also stressedcompliance with BP meds.  - She is now willing to have access placed; appreciate Dr. Donzetta Matters with [facility] providing access services. - She was confused last night and anxious; therefore, we waited till this AM for the 1st treatment. - Seen on HD at 820AM: 150/600 UF 2L, 2K bath 300/500 RIJ cath. Plan for HD 3 hrs today.   - Next HD Mon.  - Nice thrill in left BCF (appreciate Dr. Oneida Alar placing the access for this pt) - Need to CLIP (requested) 2. Hypertensive urgency- MRA reveals multiple renal arteries, however no stenosis in main arteries (2 renal arteries to left kidney with stenosis involving the accessory and 3 renal arteries to the right kidney without stenosis).  3. Acute diastolic CHF- improved with diuresis 4. Hyponatremia- due to CKD and CHF 5. Anemia of CKD- will check iron stores and start ESA 6. SHPTH- will follow ca/phos/iPTH and treat accordingly 7. Nutrition- renal diet 8. DM- per primary   Subjective:   Seen on dialysis. Slightly flat affect but she denies any dyspnea, nausea, vomiting, fevers.   Objective:   BP 131/66   Pulse 66   Temp 98.6 F (37 C) (Oral)   Resp 19   Ht 5\' 10"  (1.778 m)   Wt 102.5 kg (225 lb 15.5 oz)   SpO2 99%   BMI 32.42 kg/m   Intake/Output Summary (Last 24 hours) at 12/24/16 1610 Last data  filed at 12/23/16 1300  Gross per 24 hour  Intake              790 ml  Output               25 ml  Net              765 ml   Weight change: 0.318 kg (11.2 oz)  Physical Exam: Gen: NAD, pleasant CVS:no rub, RRR Resp:cta no rales RUE:[id number] Ext:tr edema Access: lt BCF strong thrill RIJ TC  Imaging: Dg Chest Port 1 View  Result Date: 12/23/2016 CLINICAL DATA:  Right catheter placement EXAM: PORTABLE CHEST 1 VIEW COMPARISON:  12/09/2016 FINDINGS: Right side dialysis catheter is in place with the tip in the right atrium. No pneumothorax. Cardiomegaly with vascular congestion. No confluent opacities or effusions. IMPRESSION: Right dialysis catheter tip in the right atrium.  No pneumothorax. Cardiomegaly, vascular congestion. Electronically Signed   By: Rolm Baptise M.D.   On: 12/23/2016 12:51   Dg Fluoro Guide Cv Line-no Report  Result Date: 12/23/2016 Fluoroscopy was utilized by the requesting physician.  No radiographic interpretation.    Labs: BMET  Recent Labs Lab 12/19/16 0732 12/20/16 0308 12/21/16 0320 12/22/16 0609 12/23/16 0430 12/23/16 2332  NA 133* 133* 136 136 133* 133*  K 4.5 4.7 4.7 4.6 4.7 5.7*  CL 105 104 105 107 107 104  CO2 17* 18*  16* 19* 16* 18*  GLUCOSE 156* 179* 144* 124* 167* 231*  BUN 85* 90* 88* 93* 100* 93*  CREATININE 6.57* 6.90* 6.60* 6.85* 7.17* 7.06*  CALCIUM 9.0 9.0 9.4 9.1 9.2 9.1  PHOS  --   --   --   --  5.9* 5.9*   CBC  Recent Labs Lab 12/20/16 1239 12/23/16 0430 12/23/16 2332  WBC 7.5 6.8 9.9  HGB 9.3* 8.5* 9.2*  HCT 30.4* 27.0* 30.7*  MCV 70.9* 71.6* 71.7*  PLT 262 200 179    Medications:    Marland Kitchen Darbepoetin Alfa      . aspirin  81 mg Oral Daily  . calcium acetate  1,334 mg Oral TID WC  . darbepoetin (ARANESP) injection - DIALYSIS  60 mcg Intravenous Q Sat-HD  . insulin aspart  0-15 Units Subcutaneous TID WC  . insulin aspart  0-5 Units Subcutaneous QHS  . insulin aspart  8 Units Subcutaneous TID WC  . insulin glargine   25 Units Subcutaneous QHS  . isosorbide-hydrALAZINE  2 tablet Oral TID  . mouth rinse  15 mL Mouth Rinse BID  . metoprolol tartrate  50 mg Oral BID  . pravastatin  40 mg Oral Daily  . sodium chloride flush  3 mL Intravenous Q12H      [name], MD 12/24/2016, 8:26 AM

## 2016-12-24 NOTE — Progress Notes (Signed)
HD tx initiated via HD cath w/o problem, pull/push/flush equally w/o problem, will cont to monitor while on HD tx

## 2016-12-25 LAB — GLUCOSE, CAPILLARY
GLUCOSE-CAPILLARY: 113 mg/dL — AB (ref 65–99)
GLUCOSE-CAPILLARY: 145 mg/dL — AB (ref 65–99)
Glucose-Capillary: 140 mg/dL — ABNORMAL HIGH (ref 65–99)
Glucose-Capillary: 186 mg/dL — ABNORMAL HIGH (ref 65–99)

## 2016-12-25 MED ORDER — DOCUSATE SODIUM 283 MG RE ENEM
1.0000 | ENEMA | Freq: Every day | RECTAL | Status: DC | PRN
  Filled 2016-12-25: qty 1

## 2016-12-25 MED ORDER — ONDANSETRON HCL 4 MG/2ML IJ SOLN
4.0000 mg | Freq: Once | INTRAMUSCULAR | Status: AC
  Administered 2016-12-25: 4 mg via INTRAVENOUS
  Filled 2016-12-25: qty 2

## 2016-12-25 MED ORDER — POLYETHYLENE GLYCOL 3350 17 G PO PACK
17.0000 g | PACK | Freq: Two times a day (BID) | ORAL | Status: DC
  Administered 2016-12-25 – 2016-12-27 (×6): 17 g via ORAL
  Filled 2016-12-25 (×6): qty 1

## 2016-12-25 MED ORDER — SENNOSIDES-DOCUSATE SODIUM 8.6-50 MG PO TABS
1.0000 | ORAL_TABLET | Freq: Two times a day (BID) | ORAL | Status: DC
  Administered 2016-12-25 – 2016-12-28 (×7): 1 via ORAL
  Filled 2016-12-25 (×7): qty 1

## 2016-12-25 MED ORDER — BISACODYL 10 MG RE SUPP: 10.0000 mg | Freq: Every day | RECTAL | Status: DC | PRN

## 2016-12-25 NOTE — Progress Notes (Signed)
[facility] Progress Note    Assessment/ Plan:   1. Non-oliguric AKI/CKD4vs progressive CKD. Scr was 3.32 in March 2017 and did not have any follow up with her PCP since. She has been seen by Dr. Lorrene Reid in our practice, however has not followed up in a year. 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 Herprimary nephrologist Dr. Patrice Paradise she is in denial of the severity of her condition and not sure she will follow up once stable for discharge. I discussed the need to proceed with vein mapping and vascular access placement as well as education and preparation to initiate dialysis. I also stressedcompliance with BP meds.  - She is now willing to have access placed; appreciate Dr. Donzetta Matters with [facility] providing access services. - She was confused last night and anxious; therefore, we waited till this AM for the 1st treatment. - Seen on HD at 820AM 7/7: 150/600 UF 2L, 2K bath 300/500 RIJ cath.        - Next HD Mon --> orders written; hopefully this will help w/ the nausea.       - Nice thrill in left BCF (appreciate Dr. Oneida Alar placing the access for this pt) - Need to CLIP (requested) 2. Hypertensive urgency- MRA reveals multiple renal arteries, however no stenosis in main arteries (2 renal arteries to left kidney with stenosis involving the accessory and 3 renal arteries to the right kidney without stenosis).  3. Acute diastolic CHF- improved with diuresis 4. Hyponatremia- due to CKD and CHF 5. Anemia of CKD- will check iron stores and start ESA 6. SHPTH- will follow ca/phos/iPTH and treat accordingly 7. Nutrition- renal diet 8. DM- per primary  Subjective:   Slightly flat affect but she denies any dyspnea, vomiting, fevers.  She has nausea this am.  Objective:   BP (!) 126/55 (BP Location: Other (Comment)) Comment (BP Location): right forearm   Pulse 63   Temp 98.4 F (36.9 C) (Oral)   Resp 16   Ht 5\' 10"  (1.778 m)   Wt 98.9 kg (218  lb 1.6 oz)   SpO2 93%   BMI 31.29 kg/m   Intake/Output Summary (Last 24 hours) at 12/25/16 1022 Last data filed at 12/24/16 2145  Gross per 24 hour  Intake              563 ml  Output                0 ml  Net              563 ml   Weight change: -1.876 kg (-4 lb 2.2 oz)  Physical Exam: Gen: NAD, pleasant CVS:no rub, RRR Resp:cta no rales ZTI:[id number] IPJ:[id number] Access: lt BCF strong thrill (7/8) RIJ TC  Imaging: Dg Chest Port 1 View  Result Date: 12/23/2016 CLINICAL DATA:  Right catheter placement EXAM: PORTABLE CHEST 1 VIEW COMPARISON:  12/09/2016 FINDINGS: Right side dialysis catheter is in place with the tip in the right atrium. No pneumothorax. Cardiomegaly with vascular congestion. No confluent opacities or effusions. IMPRESSION: Right dialysis catheter tip in the right atrium.  No pneumothorax. Cardiomegaly, vascular congestion. Electronically Signed   By: Rolm Baptise M.D.   On: 12/23/2016 12:51   Dg Fluoro Guide Cv Line-no Report  Result Date: 12/23/2016 Fluoroscopy was utilized by the requesting physician.  No radiographic interpretation.    Labs: [employer]  12/19/16 7673 12/20/16 0308 12/21/16 0320 12/22/16 4193 12/23/16 0430 12/23/16 2332  NA 133* 133* 136 136 133* 133*  K 4.5 4.7 4.7 4.6 4.7 5.7*  CL 105 104 105 107 107 104  CO2 17* 18* 16* 19* 16* 18*  GLUCOSE 156* 179* 144* 124* 167* 231*  BUN 85* 90* 88* 93* 100* 93*  CREATININE 6.57* 6.90* 6.60* 6.85* 7.17* 7.06*  CALCIUM 9.0 9.0 9.4 9.1 9.2 9.1  PHOS  --   --   --   --  5.9* 5.9*   CBC  Recent Labs Lab 12/20/16 1239 12/23/16 0430 12/23/16 2332  WBC 7.5 6.8 9.9  HGB 9.3* 8.5* 9.2*  HCT 30.4* 27.0* 30.7*  MCV 70.9* 71.6* 71.7*  PLT 262 200 179    Medications:    . aspirin  81 mg Oral Daily  . calcium acetate  1,334 mg Oral TID WC  . darbepoetin (ARANESP) injection - DIALYSIS  60 mcg Intravenous Q Sat-HD  . heparin subcutaneous  5,000 Units Subcutaneous Q8H  . insulin  aspart  0-15 Units Subcutaneous TID WC  . insulin aspart  0-5 Units Subcutaneous QHS  . insulin aspart  8 Units Subcutaneous TID WC  . insulin glargine  25 Units Subcutaneous QHS  . isosorbide-hydrALAZINE  2 tablet Oral TID  . mouth rinse  15 mL Mouth Rinse BID  . metoprolol tartrate  50 mg Oral BID  . pravastatin  40 mg Oral Daily  . sodium chloride flush  3 mL Intravenous Q12H      [name], MD 12/25/2016, 10:22 AM

## 2016-12-25 NOTE — Progress Notes (Signed)
Phyllis Hernandez called in regards to the hepatitis panel that was drawn a couple days ago. They said there machine was down and there may be a delay in the hepatitis panel resulting.

## 2016-12-25 NOTE — Progress Notes (Signed)
[place] [facility] [place] TEAM 1 - Stepdown/ICU TEAM  Phyllis Hernandez  GUY:[id number] DOB: April 09, 1951 DOA: 12/09/2016 PCP: Everardo Beals, NP    Brief Narrative:  66 y.o.F Hx DM, HTN, and CKD IV who presented with acute dyspnea. She summoned EMS, who found her SpO2 to be 70% and BP 250/140.  Subjective: No new complaints.  Sitting up comfortably in a bedside chair.  Denies cp, n/v, or abdom pain.   Assessment & Plan:  Hypertensive emergency with acute diastolic CHF exacerbation BP well controlled  Filed Weights   12/24/16 0518 12/24/16 0800 12/25/16 0600  Weight: 102.5 kg (225 lb 15.5 oz) 101 kg (222 lb 10.6 oz) 98.9 kg (218 lb 1.6 oz)    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 > ESRD Baseline crt 2.67 as of Aug 2017 - Nephrology following - s/p vascular surgery for access 7/6 - dialysis initiated Friday with plan for another treatment on Monday - should be ready for D/C once outpt HD arranged   Recent Labs Lab 12/20/16 0308 12/21/16 0320 12/22/16 0609 12/23/16 0430 12/23/16 2332  CREATININE 6.90* 6.60* 6.85* 7.17* 7.06*   DM2 CBG well controlled at present   Elevated troponin  Most c/w demand ischemia in setting of HTN emergency - no WMA on TTE - no indication for further inpt w/u   DVT prophylaxis: SQ heparin Code Status: FULL CODE Family Communication: No family present at time of exam today Disposition Plan: Discharge when cleared for same by Nephrology   Consultants:  Nephrology  Vasc Surgery   Procedures: 7/6 23 cm Diatek catheter right internal jugular vein + left brachial cephalic fistula  Antimicrobials:  None  Objective: Blood pressure (!) 126/55, pulse 63, temperature 98.4 F (36.9 C), temperature source Oral, resp. rate 16, height 5\' 10"  (1.778 m), weight 98.9 kg (218 lb 1.6 oz), SpO2 93 %.  Intake/Output Summary (Last 24 hours) at 12/25/16 1401 Last data filed at 12/25/16 1255  Gross per 24 hour  Intake              923 ml  Output                 0 ml  Net              923 ml   Filed Weights   12/24/16 0518 12/24/16 0800 12/25/16 0600  Weight: 102.5 kg (225 lb 15.5 oz) 101 kg (222 lb 10.6 oz) 98.9 kg (218 lb 1.6 oz)    Examination: General: No acute distress Lungs: CTA w/o crackles  Cardiovascular: RRR   CBC:  Recent Labs Lab 12/20/16 1239 12/23/16 0430 12/23/16 2332  WBC 7.5 6.8 9.9  HGB 9.3* 8.5* 9.2*  HCT 30.4* 27.0* 30.7*  MCV 70.9* 71.6* 71.7*  PLT 262 200 563   Basic Metabolic Panel:  Recent Labs Lab 12/19/16 0732 12/20/16 0308 12/21/16 0320 12/22/16 0609 12/23/16 0430 12/23/16 2332  NA 133* 133* 136 136 133* 133*  K 4.5 4.7 4.7 4.6 4.7 5.7*  CL 105 104 105 107 107 104  CO2 17* 18* 16* 19* 16* 18*  GLUCOSE 156* 179* 144* 124* 167* 231*  BUN 85* 90* 88* 93* 100* 93*  CREATININE 6.57* 6.90* 6.60* 6.85* 7.17* 7.06*  CALCIUM 9.0 9.0 9.4 9.1 9.2 9.1  MG 2.3  --   --   --   --   --   PHOS  --   --   --   --  5.9* 5.9*  GFR: Estimated Creatinine Clearance: 10.4 mL/min (A) (by C-G formula based on SCr of 7.06 mg/dL (H)).  Liver Function Tests:  Recent Labs Lab 12/23/16 0430 12/23/16 2332  ALT  --  16  ALBUMIN 2.9* 3.2*   CBG:  Recent Labs Lab 12/24/16 1152 12/24/16 1712 12/24/16 2053 12/25/16 0803 12/25/16 1151  GLUCAP 160* 140* 202* 140* 186*    Scheduled Meds: . aspirin  81 mg Oral Daily  . calcium acetate  1,334 mg Oral TID WC  . darbepoetin (ARANESP) injection - DIALYSIS  60 mcg Intravenous Q Sat-HD  . heparin subcutaneous  5,000 Units Subcutaneous Q8H  . insulin aspart  0-15 Units Subcutaneous TID WC  . insulin aspart  0-5 Units Subcutaneous QHS  . insulin aspart  8 Units Subcutaneous TID WC  . insulin glargine  25 Units Subcutaneous QHS  . isosorbide-hydrALAZINE  2 tablet Oral TID  . mouth rinse  15 mL Mouth Rinse BID  . metoprolol tartrate  50 mg Oral BID  . polyethylene glycol  17 g Oral BID  . pravastatin  40 mg Oral Daily  . senna-docusate  1 tablet Oral  BID  . sodium chloride flush  3 mL Intravenous Q12H     LOS: 16 days   Cherene Altes, MD [facility] Office  [phone] Pager - Text Page per Amion as per below:  On-Call/Text Page:      [email or web address]      password [email or web address]  If 7PM-7AM, please contact night-coverage [email or web address] Password [email or web address] 12/25/2016, 2:01 PM

## 2016-12-26 ENCOUNTER — Telehealth: Payer: Self-pay | Admitting: Vascular Surgery

## 2016-12-26 LAB — RENAL FUNCTION PANEL
ALBUMIN: 2.9 g/dL — AB (ref 3.5–5.0)
ANION GAP: 10 (ref 5–15)
BUN: 53 mg/dL — ABNORMAL HIGH (ref 6–20)
CALCIUM: 8.7 mg/dL — AB (ref 8.9–10.3)
CO2: 24 mmol/L (ref 22–32)
CREATININE: 6.39 mg/dL — AB (ref 0.44–1.00)
Chloride: 96 mmol/L — ABNORMAL LOW (ref 101–111)
GFR calc Af Amer: 7 mL/min — ABNORMAL LOW (ref >60)
GFR calc non Af Amer: 6 mL/min — ABNORMAL LOW (ref >60)
Glucose, Bld: 178 mg/dL — ABNORMAL HIGH (ref 65–99)
PHOSPHORUS: 4.5 mg/dL (ref 2.5–4.6)
Potassium: 4.4 mmol/L (ref 3.5–5.1)
SODIUM: 130 mmol/L — AB (ref 135–145)

## 2016-12-26 LAB — CBC
HCT: 27.1 % — ABNORMAL LOW (ref 36.0–46.0)
HEMOGLOBIN: 8.3 g/dL — AB (ref 12.0–15.0)
MCH: 22.3 pg — AB (ref 26.0–34.0)
MCHC: 30.6 g/dL (ref 30.0–36.0)
MCV: 72.7 fL — ABNORMAL LOW (ref 78.0–100.0)
PLATELETS: 151 10*3/uL (ref 150–400)
RBC: 3.73 MIL/uL — AB (ref 3.87–5.11)
RDW: 15.4 % (ref 11.5–15.5)
WBC: 8.5 10*3/uL (ref 4.0–10.5)

## 2016-12-26 LAB — HEPATITIS B SURFACE ANTIBODY,QUALITATIVE: HEP B S AB: NONREACTIVE

## 2016-12-26 LAB — GLUCOSE, CAPILLARY
GLUCOSE-CAPILLARY: 163 mg/dL — AB (ref 65–99)
GLUCOSE-CAPILLARY: 123 mg/dL — AB (ref 65–99)
GLUCOSE-CAPILLARY: 220 mg/dL — AB (ref 65–99)
Glucose-Capillary: 239 mg/dL — ABNORMAL HIGH (ref 65–99)

## 2016-12-26 LAB — HEPATITIS B SURFACE ANTIGEN: HEP B S AG: NEGATIVE

## 2016-12-26 LAB — HEPATITIS B CORE ANTIBODY, TOTAL: HEP B C TOTAL AB: NEGATIVE

## 2016-12-26 NOTE — Progress Notes (Signed)
[place] TEAM 1 - Stepdown/ICU TEAM  Phyllis Hernandez  NLG:[id number] DOB: [birth year] DOA: 12/09/2016 PCP: Everardo Beals, NP    Brief Narrative:  66 y.o.F Hx DM, HTN, and CKD IV who presented with acute dyspnea. She summoned EMS, who found her SpO2 to be 70% and BP 250/140.  Subjective: The patient is seen in hemodialysis.  She is in good spirits and actually laughs at my bad jokes.  She denies chest pain or shortness of breath.   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 Baseline crt 2.67 as of Aug 2017 - Nephrology following - s/p vascular surgery for access 7/6 - dialysis initiated Friday with Treatment again today - ready for D/C once outpt HD arranged   Recent Labs Lab 12/20/16 0308 12/21/16 0320 12/22/16 0609 12/23/16 0430 12/23/16 2332  CREATININE 6.90* 6.60* 6.85* 7.17* 7.06*   DM2 CBG well controlled  Elevated troponin  Most c/w demand ischemia in setting of HTN emergency - no WMA on TTE - no indication for further inpt w/u   DVT prophylaxis: SQ heparin Code Status: FULL CODE Family Communication: No family present at time of exam today Disposition Plan: Discharge when cleared for same by Nephrology   Consultants:  Nephrology  Vasc Surgery   Procedures: 7/6 23 cm Diatek catheter right internal jugular vein + left brachial cephalic fistula  Antimicrobials:  None  Objective: Blood pressure (!) 165/65, pulse 70, temperature 97.9 F (36.6 C), temperature source Oral, resp. rate 16, height 5\' 10"  (1.778 m), weight 102.5 kg (225 lb 15.5 oz), SpO2 100 %.  Intake/Output Summary (Last 24 hours) at 12/26/16 1004 Last data filed at 12/25/16 1910  Gross per 24  hour  Intake              680 ml  Output                0 ml  Net              680 ml   Filed Weights   12/24/16 0800 12/25/16 0600 12/26/16 0500  Weight: 101 kg (222 lb 10.6 oz) 98.9 kg (218 lb 1.6 oz) 102.5 kg (225 lb 15.5 oz)    Examination: General: No acute distress - More animated Lungs: CTA throughout Cardiovascular: RRR   CBC:  Recent Labs Lab 12/20/16 1239 12/23/16 0430 12/23/16 2332  WBC 7.5 6.8 9.9  HGB 9.3* 8.5* 9.2*  HCT 30.4* 27.0* 30.7*  MCV 70.9* 71.6* 71.7*  PLT 262 200 081   Basic Metabolic Panel:  Recent Labs Lab 12/20/16 0308 12/21/16 0320 12/22/16 0609 12/23/16 0430 12/23/16 2332  NA 133* 136 136 133* 133*  K 4.7 4.7 4.6 4.7 5.7*  CL 104 105 107 107 104  CO2 18* 16* 19* 16* 18*  GLUCOSE 179* 144* 124* 167* 231*  BUN 90* 88* 93* 100* 93*  CREATININE 6.90* 6.60* 6.85* 7.17* 7.06*  CALCIUM 9.0 9.4 9.1 9.2 9.1  PHOS  --   --   --  5.9* 5.9*   GFR: Estimated Creatinine Clearance: 10.6 mL/min (A) (by C-G formula based on SCr of 7.06 mg/dL (H)).  Liver Function Tests:  Recent Labs  Lab 12/23/16 0430 12/23/16 2332  ALT  --  16  ALBUMIN 2.9* 3.2*   CBG:  Recent Labs Lab 12/25/16 0803 12/25/16 1151 12/25/16 1714 12/25/16 2123 12/26/16 0738  GLUCAP 140* 186* 113* 145* 163*    Scheduled Meds: . aspirin  81 mg Oral Daily  . calcium acetate  1,334 mg Oral TID WC  . darbepoetin (ARANESP) injection - DIALYSIS  60 mcg Intravenous Q Sat-HD  . heparin subcutaneous  5,000 Units Subcutaneous Q8H  . insulin aspart  0-15 Units Subcutaneous TID WC  . insulin aspart  0-5 Units Subcutaneous QHS  . insulin aspart  8 Units Subcutaneous TID WC  . insulin glargine  25 Units Subcutaneous QHS  . isosorbide-hydrALAZINE  2 tablet Oral TID  . mouth rinse  15 mL Mouth Rinse BID  . metoprolol tartrate  50 mg Oral BID  . polyethylene glycol  17 g Oral BID  . pravastatin  40 mg Oral Daily  . senna-docusate  1 tablet Oral BID  . sodium chloride flush   3 mL Intravenous Q12H     LOS: 17 days   Cherene Altes, MD [facility] Office  [phone] Pager - Text Page per Amion as per below:  On-Call/Text Page:      [email or web address]      password [email or web address]  If 7PM-7AM, please contact night-coverage [email or web address] Password [email or web address] 12/26/2016, 10:04 AM

## 2016-12-26 NOTE — Telephone Encounter (Signed)
Sched appt 01/27/17 at 4:00 and MD 02/02/17 at 2:00. Lm on hm#.

## 2016-12-26 NOTE — Telephone Encounter (Signed)
-----   Message from Denman George, RN sent at 12/23/2016  6:22 PM EDT ----- Regarding: needs 4-6 wk. f/u with Dr. Oneida Alar and left arm access duplex   ----- Message ----- From: Elam Dutch, MD Sent: 12/23/2016  12:23 PM To: [facility] Charge Pool  US neck insert palindrome Left brachial cephalic AVF Maureen asst  appt with me 4-6 weeks with duplex of AVF  Ruta Hinds

## 2016-12-26 NOTE — Procedures (Signed)
I was present at this dialysis session. I have reviewed the session itself and made appropriate changes.   Filed Weights   12/25/16 0600 12/26/16 0500 12/26/16 0940  Weight: 98.9 kg (218 lb 1.6 oz) 102.5 kg (225 lb 15.5 oz) 103.1 kg (227 lb 4.7 oz)     Recent Labs Lab 12/26/16 1009  NA 130*  K 4.4  CL 96*  CO2 24  GLUCOSE 178*  BUN 53*  CREATININE 6.39*  CALCIUM 8.7*  PHOS 4.5     Recent Labs Lab 12/23/16 0430 12/23/16 2332 12/26/16 1009  WBC 6.8 9.9 8.5  HGB 8.5* 9.2* 8.3*  HCT 27.0* 30.7* 27.1*  MCV 71.6* 71.7* 72.7*  PLT 200 179 151    Scheduled Meds: . aspirin  81 mg Oral Daily  . calcium acetate  1,334 mg Oral TID WC  . darbepoetin (ARANESP) injection - DIALYSIS  60 mcg Intravenous Q Sat-HD  . heparin subcutaneous  5,000 Units Subcutaneous Q8H  . insulin aspart  0-15 Units Subcutaneous TID WC  . insulin aspart  0-5 Units Subcutaneous QHS  . insulin aspart  8 Units Subcutaneous TID WC  . insulin glargine  25 Units Subcutaneous QHS  . isosorbide-hydrALAZINE  2 tablet Oral TID  . mouth rinse  15 mL Mouth Rinse BID  . metoprolol tartrate  50 mg Oral BID  . polyethylene glycol  17 g Oral BID  . pravastatin  40 mg Oral Daily  . senna-docusate  1 tablet Oral BID  . sodium chloride flush  3 mL Intravenous Q12H   Continuous Infusions: . sodium chloride    . sodium chloride    . ferumoxytol     PRN Meds:.sodium chloride, sodium chloride, acetaminophen **OR** acetaminophen, alteplase, bisacodyl, docusate sodium, heparin, lidocaine (PF), lidocaine-prilocaine, menthol-cetylpyridinium, ondansetron (ZOFRAN) IV, pentafluoroprop-tetrafluoroeth    Assessment/Plan:  1. 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 She has had progressive CKD for several years and has been in denial regarding the severity of her CKD until this hospitalization.  She has been seen by Dr. Lorrene Reid in our practice, however has not followed up in a year. Unfortunately, she  did not go for vascular access placement in the past, however had TDC and LUE AVF placed during this hospitalization.  Her first session of HD was 12/24/16 and today for her second, however her bfr was 400 and dfr was 800 until examined on HD.  I decreased her bfr to 250 and dfr to 500 as this was only her second treatment.  will plan for HD again tomorrow for her 3rd session.  1. Tolerating HD well 2. Awaiting outpatient dialysis spot prior to discharge 2. Hypertensive urgency- MRA reveals multiple renal arteries, however no stenosis in main arteries (2 renal arteries to left kidney with stenosis involving the accessory and 3 renal arteries to the right kidney without stenosis).  3. Acute diastolic CHF- improved with diuresis and HD/UF 4. Hyponatremia- due to CKD and CHF 5. Anemia of CKD- will check iron stores and started ESA 6. SHPTH- will follow ca/phos/iPTH and treat accordingly 7. Nutrition- renal diet 8. DM- per primary 9. Vascular access- s/p RIJ TDC and LUE AVF on 12/23/16 by Dr. Oneida Alar. 10. Disposition- awaiting outpatient HD to be arranged.    Donetta Potts,  MD 12/26/2016, 12:35 PM

## 2016-12-27 DIAGNOSIS — K5900 Constipation, unspecified: Secondary | ICD-10-CM

## 2016-12-27 DIAGNOSIS — D638 Anemia in other chronic diseases classified elsewhere: Secondary | ICD-10-CM

## 2016-12-27 DIAGNOSIS — R7989 Other specified abnormal findings of blood chemistry: Secondary | ICD-10-CM

## 2016-12-27 DIAGNOSIS — R778 Other specified abnormalities of plasma proteins: Secondary | ICD-10-CM

## 2016-12-27 DIAGNOSIS — D696 Thrombocytopenia, unspecified: Secondary | ICD-10-CM

## 2016-12-27 DIAGNOSIS — N186 End stage renal disease: Secondary | ICD-10-CM

## 2016-12-27 LAB — GLUCOSE, CAPILLARY
Glucose-Capillary: 232 mg/dL — ABNORMAL HIGH (ref 65–99)
Glucose-Capillary: 227 mg/dL — ABNORMAL HIGH (ref 65–99)
Glucose-Capillary: 212 mg/dL — ABNORMAL HIGH (ref 65–99)
Glucose-Capillary: 221 mg/dL — ABNORMAL HIGH (ref 65–99)

## 2016-12-27 LAB — RENAL FUNCTION PANEL
ALBUMIN: 2.8 g/dL — AB (ref 3.5–5.0)
ANION GAP: 9 (ref 5–15)
BUN: 28 mg/dL — AB (ref 6–20)
CALCIUM: 8.5 mg/dL — AB (ref 8.9–10.3)
CO2: 27 mmol/L (ref 22–32)
CREATININE: 4.86 mg/dL — AB (ref 0.44–1.00)
Chloride: 97 mmol/L — ABNORMAL LOW (ref 101–111)
GFR calc Af Amer: 10 mL/min — ABNORMAL LOW (ref >60)
GFR calc non Af Amer: 9 mL/min — ABNORMAL LOW (ref >60)
GLUCOSE: 161 mg/dL — AB (ref 65–99)
PHOSPHORUS: 3.8 mg/dL (ref 2.5–4.6)
Potassium: 4.2 mmol/L (ref 3.5–5.1)
SODIUM: 133 mmol/L — AB (ref 135–145)

## 2016-12-27 LAB — CBC
HCT: 26.5 % — ABNORMAL LOW (ref 36.0–46.0)
HEMOGLOBIN: 8.1 g/dL — AB (ref 12.0–15.0)
MCH: 22.3 pg — ABNORMAL LOW (ref 26.0–34.0)
MCHC: 30.6 g/dL (ref 30.0–36.0)
MCV: 72.8 fL — ABNORMAL LOW (ref 78.0–100.0)
Platelets: 137 10*3/uL — ABNORMAL LOW (ref 150–400)
RBC: 3.64 MIL/uL — ABNORMAL LOW (ref 3.87–5.11)
RDW: 15.2 % (ref 11.5–15.5)
WBC: 9.4 10*3/uL (ref 4.0–10.5)

## 2016-12-27 MED ORDER — HEPARIN SODIUM (PORCINE) 1000 UNIT/ML DIALYSIS: 20.0000 [IU]/kg | INTRAMUSCULAR | Status: DC | PRN

## 2016-12-27 MED ORDER — HEPARIN SODIUM (PORCINE) 1000 UNIT/ML DIALYSIS
2000.0000 [IU] | Freq: Once | INTRAMUSCULAR | Status: DC
  Filled 2016-12-27: qty 2

## 2016-12-27 MED ORDER — RENA-VITE PO TABS
1.0000 | ORAL_TABLET | Freq: Every day | ORAL | Status: DC
  Administered 2016-12-27: 1 via ORAL
  Filled 2016-12-27: qty 1

## 2016-12-27 NOTE — Progress Notes (Signed)
Accepted at [facility] 1st treatment Friday July 13,2018 at 09:55 am .Tentative schedule is  Monday,Wednesday,Friday .Chairtime at 10:15am 2nd shift

## 2016-12-27 NOTE — Progress Notes (Signed)
CSW consulted to provide transportation resources for patient, as patient will discharge home and begin outpatient dialysis. CSW provided patient with contact numbers for Medicaid transportation and DSS for assistance with Medicaid transportation to dialysis treatments. Patient displayed low motivation to call resources for transportation. CSW assessed patient plan for accessing dialysis. Patient with flat affect and providing minimal verbal response to CSW. Patient reported her friend can help provide transportation. CSW provided patient with SCAT application and discussed application process. CSW encouraged patient to complete application soon, as process will take time. Inpatient hemodialysis provided patient and CSW with outpatient treatment clinic information and schedule. CSW signing off as transportation resources provided.  Estanislado Emms, [place]

## 2016-12-27 NOTE — Progress Notes (Signed)
S: Pt seen on HD.  Says she is eating.  Up walking some.  Lives closest to [facility] according to her.  Ap 140 Vp 110  BFR 300 O:BP (!) 123/57 (BP Location: Other (Comment)) Comment (BP Location): right forearm   Pulse 66   Temp 98 F (36.7 C) (Oral)   Resp 18   Ht 5\' 10"  (1.778 m)   Wt 102.1 kg (225 lb 1.6 oz)   SpO2 95%   BMI 32.30 kg/m   Intake/Output Summary (Last 24 hours) at 12/27/16 0705 Last data filed at 12/26/16 2135  Gross per 24 hour  Intake             1041 ml  Output             1500 ml  Net             -459 ml   Weight change: 0.6 kg (1 lb 5.2 oz) PFX:TKWIO and alert CVS: SL irregular Resp:clear Abd: + BS NTND Ext: 0-tr edema.  LUA AVF + bruit NEURO:CNI Ox3 no asterixis   . aspirin  81 mg Oral Daily  . calcium acetate  1,334 mg Oral TID WC  . darbepoetin (ARANESP) injection - DIALYSIS  60 mcg Intravenous Q Sat-HD  . heparin  2,000 Units Dialysis Once in dialysis  . heparin subcutaneous  5,000 Units Subcutaneous Q8H  . insulin aspart  0-15 Units Subcutaneous TID WC  . insulin aspart  0-5 Units Subcutaneous QHS  . insulin aspart  8 Units Subcutaneous TID WC  . insulin glargine  25 Units Subcutaneous QHS  . isosorbide-hydrALAZINE  2 tablet Oral TID  . mouth rinse  15 mL Mouth Rinse BID  . metoprolol tartrate  50 mg Oral BID  . polyethylene glycol  17 g Oral BID  . pravastatin  40 mg Oral Daily  . senna-docusate  1 tablet Oral BID  . sodium chloride flush  3 mL Intravenous Q12H   No results found. BMET    Component Value Date/Time   NA 133 (L) 12/27/2016 0524   K 4.2 12/27/2016 0524   CL 97 (L) 12/27/2016 0524   CO2 27 12/27/2016 0524   GLUCOSE 161 (H) 12/27/2016 0524   BUN 28 (H) 12/27/2016 0524   CREATININE 4.86 (H) 12/27/2016 0524   CALCIUM 8.5 (L) 12/27/2016 0524   GFRNONAA 9 (L) 12/27/2016 0524   GFRAA 10 (L) 12/27/2016 0524   CBC    Component Value Date/Time   WBC 9.4 12/27/2016 0524   RBC 3.64 (L) 12/27/2016 0524   HGB 8.1 (L)  12/27/2016 0524   HCT 26.5 (L) 12/27/2016 0524   PLT 137 (L) 12/27/2016 0524   MCV 72.8 (L) 12/27/2016 0524   MCH 22.3 (L) 12/27/2016 0524   MCHC 30.6 12/27/2016 0524   RDW 15.2 12/27/2016 0524   LYMPHSABS 1.2 12/10/2016 0322   MONOABS 1.1 (H) 12/10/2016 0322   EOSABS 0.0 12/10/2016 0322   BASOSABS 0.0 12/10/2016 0322     Assessment: 1. New ESRD sec HTN/DM 2. HTN 3. Anemia on aranesp and getting IV iron 4. Mild thrombocytopenia, will need to follow   Plan: 1. HD today 2. Check PTH 3. Start renal vitamin 4. Consider stopping sub Q heparin due to thrombocytopenia Phyllis Hernandez T

## 2016-12-27 NOTE — Plan of Care (Signed)
Problem: Education: Goal: Knowledge of [facility] Education information/materials will improve Outcome: Progressing Patient aware of plan of care.  RN provided medication education on all medications administered thus far this shift prior to administration.  Patient stated understanding.     

## 2016-12-27 NOTE — Progress Notes (Signed)
PROGRESS NOTE    Phyllis Hernandez  UXN:[id number] DOB: 18-Mar-1951 DOA: 12/09/2016 PCP: Everardo Beals, NP  Brief Narrative:  Phyllis Hernandez is a 66 y.o. female with a past medical history significant for NIDDM and HTN and CKD IV? who presents with acute dyspnea.The patient was in her usual state of health until a week or so priopr to Admission when she was talking on the phone, had an episode which she could hardly breathing had to sit down, and the feeling slowly passed by itself.  Since then she has been fine, had noticed a little leg swelling yesterday, but has been taking her BP meds normally, going about her normal business today (went to the store, walked around, did some housework, all without difficulty) then tonight around midnight, she was getting ready for bed and started to feel SOB, sweaty and nervous.  She tried to cough but couldn't cough anything up, and the SOB got worse and worse until she panicked and called 9-1-1.  EMS found her SpO2 70% and BP 250/140 and transported. Was admitted and ended up being put on Dialysis. On 12/23/16 she had a23 cm Diatek catheter right internal jugular vein + left brachial cephalic fistula and was dialyzed. Patient doing well and waiting for a confirmed Dialysis spot. Per report today patient has been accepted to [facility] and will need confirmation from Nephrology prior to D/C. Patient also having issues with transportation. Will likely D/C in AM.   Assessment & Plan:   Principal Problem:   Acute on chronic renal failure (HCC) Active Problems:   Type 2 diabetes mellitus with complication, without long-term current use of insulin (HCC)   Hyperlipidemia   Essential hypertension   Hypertensive emergency   ESRD (end stage renal disease) (HCC)   Elevated troponin   Anemia of chronic disease   Thrombocytopenia (HCC)   Constipation  Hypertensive Emergency with Acute Diastolic CHF Exacerbation -ECHOCardiogram  -BP improved after initiation of  HD -C/w Metoprolol 50 mg po BID -C/w Isosorbide-Hydralazine 20-37.5 2 tablets po TID -Continue to Monitor BP's   AKI on CKD now Progressed to ESRD; ESRD 2/2 to Uncontrolled HTN and Diabetes Mellitus  -Baseline Cr was 2.67 as of August 2017 -Nephrology Following and appreciate further management and evaluation -Patient is s/p vascular surgery for access 7/6  -Dialysis initiated Friday (12/23/16) with Treatment again Today (12/30/16)   -BUN/Cr went from 53/6.39 -> 28/4.86 -C/w Calcium Acetate 1,334 mg po TIDwm -C/w Rena-Vit 1 tab po qhS -Ready for D/C once outpt HD arranged; Per RN report she was accepted at [facility] and 1st treatment is this Friday and Tentative schedule is MWF but will need to confirm with Nephrology Dr. Mercy Moore prior to D/C in AM -Dispensing optician for patient to travel to Dialysis   Diabetes Mellitus Type 2 -CBG's ranging from 220-239 -C/w Lantus 25 Units sq qHS, Moderate Novolog SSI AC/HS, and Novolog 8 units TIDwm -Hemoglobin A1c was 7.3 but was previous 14.4 in 2011 -Continue to Monitor  Elevated Troponin -Troponin was 0.13 x2 -Most Consistent with Demand Ischemia in the Setting of HTN Emergency and CKD -ECHO showed no Wall Motion Abnormality -No indication for further Inpatient Workup  Anemia of Chronic Disease likely to CKD and now ESRD -Patient's Hb/Hct went from 8.3/27.1 -> 8.1/26.5 -Patient received Ferumoxytol 510 mg IV on 12/23/16 -C/w Darbeopoetin Alfa 60 mcg IV qSat (First Dose given 12/24/16) -Repeat CBC in AM  Hyperlipidemia -C/w Pravastatin 40 mg po Daily  Thrombocytopenia -Patient's  Platelet Count went from 151 -> 137 -Will stop Subq Heparin -Continue to monitor and repeat CBC in AM  Constipation -C/w Senna-Docusate 1 tab po BID, Miralax 17 g po BID, Bisacodyl 10 mg Rectal Daily prn, and Docusate Soduim Enema 283 mg Daily severe Constipation  DVT prophylaxis: Heparin 5,000 units sq q8h discontinued due to  thrombocytopenia; C/w SCDs Code Status: FULL CODE Family Communication: No family present at bedside Disposition Plan: D/C Home likely in AM if cleared by Nephrology: Need to speak with Nephrologist prior ot D/C  Consultants:   Nephrology  Vascular Surgery    Procedures: 7/6 23 cm Diatek catheter right internal jugular vein + left brachial cephalic fistula  ECHOCARDIOGRAM Study Conclusions  - Left ventricle: The cavity size was normal. Wall thickness was   increased in a pattern of mild LVH. Systolic function was normal.   The estimated ejection fraction was in the range of 60% to 65%.   Wall motion was normal; there were no regional wall motion   abnormalities. Left ventricular diastolic function parameters   were normal. - Aortic valve: Valve area (VTI): 1.54 cm^2. Valve area (Vmax):   1.32 cm^2. Valve area (Vmean): 1.19 cm^2. - Atrial septum: No defect or patent foramen ovale was identified.   Antimicrobials:  Anti-infectives    Start     Dose/Rate Route Frequency Ordered Stop   12/23/16 0800  ceFAZolin (ANCEF) IVPB 2g/100 mL premix    Comments:  Send with pt to OR   2 g 200 mL/hr over 30 Minutes Intravenous To ShortStay Procedural 12/22/16 2014 12/23/16 1058   12/23/16 0600  ceFAZolin (ANCEF) IVPB 1 g/50 mL premix  Status:  Discontinued    Comments:  Send with pt to OR   1 g 100 mL/hr over 30 Minutes Intravenous On call 12/22/16 1151 12/22/16 2014     Subjective: Seen and examined during dialysis. She had no complaints but has a very flat affect. No CP or SOB. No pain. No other concerns.   Objective: Vitals:   12/27/16 1030 12/27/16 1100 12/27/16 1246 12/27/16 1433  BP: (!) 132/59 134/65 134/65 (!) 131/52  Pulse: 70 72 72 76  Resp:      Temp:  98.3 F (36.8 C)  98.2 F (36.8 C)  TempSrc:  Oral  Oral  SpO2:    99%  Weight:  99.8 kg (220 lb 0.3 oz)    Height:        Intake/Output Summary (Last 24 hours) at 12/27/16 1811 Last data filed at 12/27/16 1402   Gross per 24 hour  Intake              804 ml  Output             2000 ml  Net            -1196 ml   Filed Weights   12/27/16 0519 12/27/16 0718 12/27/16 1100  Weight: 102.1 kg (225 lb 1.6 oz) 102.2 kg (225 lb 5 oz) 99.8 kg (220 lb 0.3 oz)   Examination: Physical Exam:  Constitutional: WN/WD obese AAF in NAD and appears calm and comfortable getting dialyzed  Eyes: Lids and conjunctivae normal, sclerae anicteric  ENMT: External Ears, Nose appear normal. Grossly normal hearing. Mucous membranes are moist. Neck: Appears normal, supple, no cervical masses, normal ROM, no appreciable thyromegaly, no JVD Respiratory: Clear to auscultation bilaterally, no wheezing, rales, rhonchi or crackles. Normal respiratory effort and patient is not tachypenic. No accessory muscle use.  Cardiovascular: RRR, no murmurs / rubs / gallops. S1 and S2 auscultated. Mild edema.  Abdomen: Soft, non-tender, non-distended. No masses palpated. No appreciable hepatosplenomegaly. Bowel sounds positive x4 GU: Deferred. Musculoskeletal: No clubbing / cyanosis of digits/nails. No joint deformity upper and lower extremities.  Skin: Patient has chronic leg changes from Diabetes on Right leg. No induration; Warm and dry.  Neurologic: CN 2-12 grossly intact with no focal deficits. Romberg sign cerebellar reflexes not assessed.  Psychiatric: Normal judgment and insight. Alert and oriented x 3. Depressed mood and flat affect.   Data Reviewed: I have personally reviewed following labs and imaging studies  CBC:  Recent Labs Lab 12/23/16 0430 12/23/16 2332 12/26/16 1009 12/27/16 0524  WBC 6.8 9.9 8.5 9.4  HGB 8.5* 9.2* 8.3* 8.1*  HCT 27.0* 30.7* 27.1* 26.5*  MCV 71.6* 71.7* 72.7* 72.8*  PLT 200 179 151 973*   Basic Metabolic Panel:  Recent Labs Lab 12/22/16 0609 12/23/16 0430 12/23/16 2332 12/26/16 1009 12/27/16 0524  NA 136 133* 133* 130* 133*  K 4.6 4.7 5.7* 4.4 4.2  CL 107 107 104 96* 97*  CO2 19* 16* 18*  24 27  GLUCOSE 124* 167* 231* 178* 161*  BUN 93* 100* 93* 53* 28*  CREATININE 6.85* 7.17* 7.06* 6.39* 4.86*  CALCIUM 9.1 9.2 9.1 8.7* 8.5*  PHOS  --  5.9* 5.9* 4.5 3.8   GFR: Estimated Creatinine Clearance: 15.2 mL/min (A) (by C-G formula based on SCr of 4.86 mg/dL (H)). Liver Function Tests:  Recent Labs Lab 12/23/16 0430 12/23/16 2332 12/26/16 1009 12/27/16 0524  ALT  --  16  --   --   ALBUMIN 2.9* 3.2* 2.9* 2.8*   No results for input(s): LIPASE, AMYLASE in the last 168 hours. No results for input(s): AMMONIA in the last 168 hours. Coagulation Profile: No results for input(s): INR, PROTIME in the last 168 hours. Cardiac Enzymes: No results for input(s): CKTOTAL, CKMB, CKMBINDEX, TROPONINI in the last 168 hours. BNP (last 3 results) No results for input(s): PROBNP in the last 8760 hours. HbA1C: No results for input(s): HGBA1C in the last 72 hours. CBG:  Recent Labs Lab 12/26/16 1337 12/26/16 1731 12/26/16 2122 12/27/16 1616 12/27/16 1649  GLUCAP 123* 220* 239* 232* 227*   Lipid Profile: No results for input(s): CHOL, HDL, LDLCALC, TRIG, CHOLHDL, LDLDIRECT in the last 72 hours. Thyroid Function Tests: No results for input(s): TSH, T4TOTAL, FREET4, T3FREE, THYROIDAB in the last 72 hours. Anemia Panel: No results for input(s): VITAMINB12, FOLATE, FERRITIN, TIBC, IRON, RETICCTPCT in the last 72 hours. Sepsis Labs: No results for input(s): PROCALCITON, LATICACIDVEN in the last 168 hours.  No results found for this or any previous visit (from the past 240 hour(s)).   Radiology Studies: No results found.  Scheduled Meds: . aspirin  81 mg Oral Daily  . calcium acetate  1,334 mg Oral TID WC  . darbepoetin (ARANESP) injection - DIALYSIS  60 mcg Intravenous Q Sat-HD  . heparin subcutaneous  5,000 Units Subcutaneous Q8H  . insulin aspart  0-15 Units Subcutaneous TID WC  . insulin aspart  0-5 Units Subcutaneous QHS  . insulin aspart  8 Units Subcutaneous TID WC  .  insulin glargine  25 Units Subcutaneous QHS  . isosorbide-hydrALAZINE  2 tablet Oral TID  . mouth rinse  15 mL Mouth Rinse BID  . metoprolol tartrate  50 mg Oral BID  . multivitamin  1 tablet Oral QHS  . polyethylene glycol  17 g Oral BID  .  pravastatin  40 mg Oral Daily  . senna-docusate  1 tablet Oral BID  . sodium chloride flush  3 mL Intravenous Q12H   Continuous Infusions: . ferumoxytol      LOS: 18 days   Kerney Elbe, DO [facility] Pager [phone]  If 7PM-7AM, please contact night-coverage [email or web address] Password [email or web address] 12/27/2016, 6:11 PM

## 2016-12-28 DIAGNOSIS — K5900 Constipation, unspecified: Secondary | ICD-10-CM

## 2016-12-28 DIAGNOSIS — E785 Hyperlipidemia, unspecified: Secondary | ICD-10-CM

## 2016-12-28 DIAGNOSIS — N186 End stage renal disease: Secondary | ICD-10-CM

## 2016-12-28 DIAGNOSIS — R748 Abnormal levels of other serum enzymes: Secondary | ICD-10-CM

## 2016-12-28 DIAGNOSIS — D638 Anemia in other chronic diseases classified elsewhere: Secondary | ICD-10-CM

## 2016-12-28 LAB — COMPREHENSIVE METABOLIC PANEL
ALBUMIN: 3 g/dL — AB (ref 3.5–5.0)
ALK PHOS: 53 U/L (ref 38–126)
ALT: 9 U/L — AB (ref 14–54)
ANION GAP: 10 (ref 5–15)
AST: 20 U/L (ref 15–41)
BILIRUBIN TOTAL: 0.6 mg/dL (ref 0.3–1.2)
BUN: 19 mg/dL (ref 6–20)
CALCIUM: 8.7 mg/dL — AB (ref 8.9–10.3)
CO2: 26 mmol/L (ref 22–32)
CREATININE: 4.44 mg/dL — AB (ref 0.44–1.00)
Chloride: 95 mmol/L — ABNORMAL LOW (ref 101–111)
GFR calc Af Amer: 11 mL/min — ABNORMAL LOW (ref >60)
GFR calc non Af Amer: 10 mL/min — ABNORMAL LOW (ref >60)
GLUCOSE: 148 mg/dL — AB (ref 65–99)
Potassium: 4 mmol/L (ref 3.5–5.1)
SODIUM: 131 mmol/L — AB (ref 135–145)
TOTAL PROTEIN: 6.6 g/dL (ref 6.5–8.1)

## 2016-12-28 LAB — CBC WITH DIFFERENTIAL/PLATELET
BASOS ABS: 0 10*3/uL (ref 0.0–0.1)
BASOS PCT: 0 %
Eosinophils Absolute: 0.4 10*3/uL (ref 0.0–0.7)
Eosinophils Relative: 4 %
HEMATOCRIT: 27.2 % — AB (ref 36.0–46.0)
HEMOGLOBIN: 8.2 g/dL — AB (ref 12.0–15.0)
LYMPHS PCT: 25 %
Lymphs Abs: 2.3 10*3/uL (ref 0.7–4.0)
MCH: 22.2 pg — AB (ref 26.0–34.0)
MCHC: 30.1 g/dL (ref 30.0–36.0)
MCV: 73.7 fL — ABNORMAL LOW (ref 78.0–100.0)
MONOS PCT: 12 %
Monocytes Absolute: 1.1 10*3/uL — ABNORMAL HIGH (ref 0.1–1.0)
NEUTROS ABS: 5.4 10*3/uL (ref 1.7–7.7)
NEUTROS PCT: 59 %
Platelets: 174 10*3/uL (ref 150–400)
RBC: 3.69 MIL/uL — ABNORMAL LOW (ref 3.87–5.11)
RDW: 15.4 % (ref 11.5–15.5)
WBC: 9.2 10*3/uL (ref 4.0–10.5)

## 2016-12-28 LAB — MAGNESIUM: Magnesium: 2.1 mg/dL (ref 1.7–2.4)

## 2016-12-28 LAB — GLUCOSE, CAPILLARY
Glucose-Capillary: 117 mg/dL — ABNORMAL HIGH (ref 65–99)
Glucose-Capillary: 234 mg/dL — ABNORMAL HIGH (ref 65–99)

## 2016-12-28 LAB — PHOSPHORUS: Phosphorus: 3.2 mg/dL (ref 2.5–4.6)

## 2016-12-28 MED ORDER — INSULIN PEN NEEDLE 29G X 12MM MISC: 0 refills | Status: DC

## 2016-12-28 MED ORDER — SENNOSIDES-DOCUSATE SODIUM 8.6-50 MG PO TABS: 1.0000 | ORAL_TABLET | Freq: Two times a day (BID) | ORAL | 0 refills | Status: DC

## 2016-12-28 MED ORDER — POLYETHYLENE GLYCOL 3350 17 G PO PACK: 17.0000 g | PACK | Freq: Two times a day (BID) | ORAL | 0 refills | Status: DC

## 2016-12-28 MED ORDER — BISACODYL 10 MG RE SUPP: 10.0000 mg | Freq: Every day | RECTAL | 0 refills | Status: DC | PRN

## 2016-12-28 MED ORDER — INSULIN ASPART 100 UNIT/ML FLEXPEN: 0.0000 [IU] | PEN_INJECTOR | Freq: Three times a day (TID) | SUBCUTANEOUS | 0 refills | Status: DC

## 2016-12-28 MED ORDER — ISOSORB DINITRATE-HYDRALAZINE 20-37.5 MG PO TABS: 2.0000 | ORAL_TABLET | Freq: Three times a day (TID) | ORAL | 0 refills | Status: DC

## 2016-12-28 MED ORDER — CALCIUM ACETATE (PHOS BINDER) 667 MG PO CAPS: 1334.0000 mg | ORAL_CAPSULE | Freq: Three times a day (TID) | ORAL | 0 refills | Status: DC

## 2016-12-28 MED ORDER — RENA-VITE PO TABS: 1.0000 | ORAL_TABLET | Freq: Every day | ORAL | 0 refills | Status: DC

## 2016-12-28 MED ORDER — INSULIN GLARGINE 100 UNIT/ML SOLOSTAR PEN: 25.0000 [IU] | PEN_INJECTOR | Freq: Every day | SUBCUTANEOUS | 0 refills | Status: DC

## 2016-12-28 NOTE — Discharge Summary (Signed)
Physician Discharge Summary  Phyllis Hernandez OZH:[id number] DOB: 1951/01/15  PCP: Everardo Beals, NP  Admit date: 12/09/2016 Discharge date: 12/28/2016  Recommendations for Outpatient Follow-up:  1. Everardo Beals, NP/PCP in 5 days. 2. Dr. Ruta Hinds, Vascular Surgery on 02/02/2017 at 2 PM. 3. [facility]/hemodialysis center for MWF HD. First appointment on 12/30/2016 at 10:15 AM.   Home Health: RN Equipment/Devices: None    Discharge Condition: Improved and stable  CODE STATUS: Full  Diet recommendation: Heart healthy and diabetic diet.  Discharge Diagnoses:  Principal Problem:   Acute on chronic renal failure (HCC) Active Problems:   Type 2 diabetes mellitus with complication, without long-term current use of insulin (HCC)   Hyperlipidemia   Essential hypertension   Hypertensive emergency   ESRD (end stage renal disease) (HCC)   Elevated troponin   Anemia of chronic disease   Thrombocytopenia (HCC)   Constipation   Brief Summary: 66 year old female with PMH of type II DM, HTN,? stage IV chronic kidney disease, anemia, HLD presented with acute dyspnea. EMS found her oxygen saturation at 70% and blood pressure 250/140. She was hospitalized, nephrology was consulted, eventually on hemodialysis.  Assessment and plan: 1. New ESRD: Secondary to hypertension and diabetic nephropathy. Baseline creatinine was 2.67 as of August 2017. Nephrology consulted and assisted with management. 12/23/16 underwent right IJ HD catheter insertion and left brachiocephalic AV fistula insertion. HD was initiated on 12/23/16. As per nephrology follow-up today, outpatient HD has been arranged at [facility] MWF with first treatment on Friday 12/30/16 at 10:15 AM. Nephrology has cleared patient for discharge home. 2. Hypertensive emergency: BPs have improved after dialysis and with current medications, continue. 3. Volume overload/acute diastolic CHF: Likely precipitated by ESRD. Diuretics  discontinued. Volume management across HD. Improved and compensated. 4. Type II DM with renal complications: Due to ESRD and risk of hypoglycemia, discontinued all oral hypoglycemics. Patient was treated in the hospital with Lantus and NovoLog. After confirming with case management, patient will be discharged on Lantus Solostar pen and NovoLog flex pen SSI. CBGs are fluctuating and mildly uncontrolled. Her insulins will have to be titrated during close outpatient follow-up. Patient has been educated regarding insulin management and she has been on insulin pens in the past. Hemoglobin A1c: 7.3 on 12/10/16. 5. Elevated troponin: Likely due to demand ischemia in the setting of hypertensive emergency and acute diastolic heart failure. 2-D echo did not show any wall motion abnormalities. No further workup indicated. 6. Anemia: Related to chronic kidney disease and chronic disease. Patient received IV iron and Aranesp in the hospital. Continue oral iron supplements. Follow CBC and BMP periodically across dialysis. Hemoglobin stable. 7. Hyperlipidemia: Continue statins. 8. Thrombocytopenia: Transient and resolved. 9. Constipation: Continue bowel regimen. Having a.m.'s. 10. Hyponatremia: Likely related to ESRD and HD. Stable. Follow BMP periodically at dialysis.  Consultations:  Nephrology  Vascular surgery  Procedures:  12/23/16:23 cm Diatek catheter right IJ vein + left brachiocephalic fistula creation.  2-D echo 12/10/16: Study Conclusions  - Left ventricle: The cavity size was normal. Wall thickness was   increased in a pattern of mild LVH. Systolic function was normal.   The estimated ejection fraction was in the range of 60% to 65%.   Wall motion was normal; there were no regional wall motion   abnormalities. Left ventricular diastolic function parameters   were normal. - Aortic valve: Valve area (VTI): 1.54 cm^2. Valve area (Vmax):   1.32 cm^2. Valve area (Vmean): 1.19 cm^2. - Atrial septum:  No  defect or patent foramen ovale was identified.   Discharge Instructions  Discharge Instructions    Call MD for:  difficulty breathing, headache or visual disturbances    Complete by:  As directed    Call MD for:  extreme fatigue    Complete by:  As directed    Call MD for:  persistant dizziness or light-headedness    Complete by:  As directed    Call MD for:  persistant nausea and vomiting    Complete by:  As directed    Call MD for:  redness, tenderness, or signs of infection (pain, swelling, redness, odor or green/yellow discharge around incision site)    Complete by:  As directed    Call MD for:  severe uncontrolled pain    Complete by:  As directed    Call MD for:  temperature >100.4    Complete by:  As directed    Diet - low sodium heart healthy    Complete by:  As directed    Diet Carb Modified    Complete by:  As directed    Increase activity slowly    Complete by:  As directed        Medication List    STOP taking these medications   amLODipine 10 MG tablet Commonly known as:  NORVASC   glipiZIDE 10 MG tablet Commonly known as:  GLUCOTROL   indapamide 1.25 MG tablet Commonly known as:  LOZOL   metFORMIN 1000 MG tablet Commonly known as:  GLUCOPHAGE     TAKE these medications   aspirin 81 MG chewable tablet Chew 81 mg by mouth daily.   bisacodyl 10 MG suppository Commonly known as:  DULCOLAX Place 1 suppository (10 mg total) rectally daily as needed for moderate constipation.   calcium acetate 667 MG capsule Commonly known as:  PHOSLO Take 2 capsules (1,334 mg total) by mouth 3 (three) times daily with meals.   ferrous sulfate 325 (65 FE) MG tablet Take 325 mg by mouth daily with breakfast.   insulin aspart 100 UNIT/ML FlexPen Commonly known as:  NOVOLOG FLEXPEN Inject 0-15 Units into the skin 3 (three) times daily with meals. CBG < 70: Eat or drink something sweet and recheck. CBG 70 - 120: 0 units CBG 121 - 150: 2 units CBG 151 - 200: 3 units  CBG 201 - 250: 5 units CBG 251 - 300: 8 units CBG 301 - 350: 11 units CBG 351 - 400: 15 units CBG > 400: call MD.   Insulin Glargine 100 UNIT/ML Solostar Pen Commonly known as:  LANTUS Inject 25 Units into the skin daily at 10 pm.   Insulin Pen Needle 29G X 12MM Misc To be used per instructions 3 times a day with meals and at bedtime.   isosorbide-hydrALAZINE 20-37.5 MG tablet Commonly known as:  BIDIL Take 2 tablets by mouth 3 (three) times daily.   metoprolol tartrate 50 MG tablet Commonly known as:  LOPRESSOR Take 50 mg by mouth 2 (two) times daily.   multivitamin Tabs tablet Take 1 tablet by mouth at bedtime.   polyethylene glycol packet Commonly known as:  MIRALAX / GLYCOLAX Take 17 g by mouth 2 (two) times daily.   pravastatin 40 MG tablet Commonly known as:  PRAVACHOL Take 40 mg by mouth daily.   senna-docusate 8.6-50 MG tablet Commonly known as:  Senokot-S Take 1 tablet by mouth 2 (two) times daily.      Follow-up Information    Ruta Hinds  E, MD Follow up in 6 week(s).   Specialties:  Vascular Surgery, Cardiology Why:  office will call Contact information: [address] [place] [address] [phone]        Everardo Beals, NP. Schedule an appointment as soon as possible for a visit in 5 day(s).   Contact information: [facility] [place] [address] [phone]        [facility]/hemodialysis center Follow up on 12/30/2016.   Why:  10:15 AM for first outpatient dialysis appointment.       Care, [facility] Follow up.   Specialty:  Home Health Services Why:  Registered Nurse Contact information: [place] [address] [phone]          Allergies  Allergen Reactions  . No Known Allergies       Procedures/Studies: Mr Jodene Nam Abdomen Wo Contrast  Result Date: 12/18/2016 CLINICAL DATA:  65 year old with hypertension and worsening renal  function. Concern for left renal artery stenosis based on renal artery duplex. EXAM: MRA ABDOMEN WITH CONTRAST TECHNIQUE: Multiplanar, multiecho pulse sequences of the abdomen and pelvis were obtained with intravenous contrast. Angiographic images of abdomen and pelvis were obtained using MRA technique with intravenous contrast. CONTRAST:  None COMPARISON:  Renal artery duplex report 12/14/2016 FINDINGS: Vascular structures This is a high quality noncontrast examination. Normal caliber of the abdominal aorta. Celiac trunk and main branch vessels are patent. SMA is patent. Proximal IMA is patent. Normal caliber of the proximal common iliac arteries. There are multiple renal arteries. Evidence for at least three right renal arteries. The main right renal artery is patent without significant narrowing. Main right renal artery is at the 10 o'clock position on the aorta. There is an accessory inferior and superior right renal artery. Evidence for two left renal arteries. The main left renal artery appears to be widely patent. There accessory left renal artery is just caudal to the main left renal artery. There appears to be a stenosis involving this accessory left renal artery. Main left renal artery is at the 4 o'clock position and the accessory left renal artery is at the 2 o'clock position. Nonvascular structures No gross abnormality to the liver or gallbladder. Normal appearance of the pancreas and spleen. Normal appearance of the kidneys without hydronephrosis. Adrenals appear to be normal. No large pleural effusions or significant ascites. IMPRESSION: Multiple renal arteries. No significant stenosis involving the bilateral main renal arteries. There may be a stenosis involving the accessory left renal artery which could account for the abnormality on the renal duplex. Electronically Signed   By: Markus Daft M.D.   On: 12/18/2016 08:07   US Renal  Result Date: 12/20/2016 CLINICAL DATA:  Acute renal failure EXAM:  RENAL / URINARY TRACT ULTRASOUND COMPLETE COMPARISON:  Abdominal MRI December 18, 2016 FINDINGS: Right Kidney: Length: 11.2 cm. Echogenicity is borderline increased. Renal cortical thickness is within normal limits. No mass, perinephric fluid, or hydronephrosis visualized. No sonographically demonstrable calculus or ureterectasis. Left Kidney: Length: 12.4 cm. Echogenicity is increased. Renal cortical thickness is normal. No mass, perinephric fluid, or hydronephrosis visualized. No sonographically demonstrable calculus or ureterectasis. Bladder: Appears normal for degree of bladder distention. IMPRESSION: Increase in renal echogenicity, more notable on the left than on the right. This is a finding that may be seen with medical renal disease. No obstructing focus in either kidney. Study otherwise unremarkable. Electronically Signed   By: Lowella Grip III M.D.  On: 12/20/2016 13:54   Dg Chest Port 1 View  Result Date: 12/23/2016 CLINICAL DATA:  Right catheter placement EXAM: PORTABLE CHEST 1 VIEW COMPARISON:  12/09/2016 FINDINGS: Right side dialysis catheter is in place with the tip in the right atrium. No pneumothorax. Cardiomegaly with vascular congestion. No confluent opacities or effusions. IMPRESSION: Right dialysis catheter tip in the right atrium.  No pneumothorax. Cardiomegaly, vascular congestion. Electronically Signed   By: Rolm Baptise M.D.   On: 12/23/2016 12:51   Dg Chest Portable 1 View  Result Date: 12/09/2016 CLINICAL DATA:  Acute onset of respiratory distress. Initial encounter. EXAM: PORTABLE CHEST 1 VIEW COMPARISON:  Chest radiograph performed 04/17/2008 FINDINGS: The lungs are well-aerated. Vascular congestion is noted. Bilateral airspace opacification likely reflects pulmonary edema. No pleural effusion or pneumothorax is seen. The cardiomediastinal silhouette is borderline enlarged. No acute osseous abnormalities are seen. A loose body is noted at the inferior right glenoid. IMPRESSION:  Vascular congestion and borderline cardiomegaly. Bilateral airspace opacification likely reflects pulmonary edema. Electronically Signed   By: Garald Balding M.D.   On: 12/09/2016 01:34   Dg Fluoro Guide Cv Line-no Report  Result Date: 12/23/2016 Fluoroscopy was utilized by the requesting physician.  No radiographic interpretation.      Subjective: Patient denies complaints. States that she feels "fine" and denies complaints. No chest pain, cough, dyspnea, dizziness or lightheadedness reported. States that she has been ambulating to the toilet without difficulty. As per RN, no acute issues reported.  Discharge Exam:  Vitals:   12/27/16 2055 12/28/16 0653 12/28/16 0830 12/28/16 1300  BP: (!) 104/55 (!) 121/50 133/62 (!) 135/57  Pulse: 71 69  73  Resp: 18 17  18   Temp: 98.6 F (37 C) 98.3 F (36.8 C)  97.9 F (36.6 C)  TempSrc: Oral Oral  Oral  SpO2: 97% 98%  97%  Weight:  99.8 kg (220 lb)    Height:        General: Pleasant middle-aged female, moderately built and obese, lying comfortably supine in bed. Cardiovascular: S1 & S2 heard, RRR, S1/S2 +. No murmurs, rubs, gallops or clicks. No JVD or pedal edema.  Respiratory: Clear to auscultation without wheezing, rhonchi or crackles. No increased work of breathing. Right IJ HD catheter without acute findings. Abdominal:  Non distended, non tender & soft. No organomegaly or masses appreciated. Normal bowel sounds heard. CNS: Alert and oriented. No focal deficits. Extremities: no edema, no cyanosis. Left cubital foci brachial cephalic fistula site without acute findings and has a good thrill.    The results of significant diagnostics from this hospitalization (including imaging, microbiology, ancillary and laboratory) are listed below for reference.     Microbiology: No results found for this or any previous visit (from the past 240 hour(s)).   Labs: CBC:  Recent Labs Lab 12/23/16 0430 12/23/16 2332 12/26/16 1009  12/27/16 0524 12/28/16 0437  WBC 6.8 9.9 8.5 9.4 9.2  NEUTROABS  --   --   --   --  5.4  HGB 8.5* 9.2* 8.3* 8.1* 8.2*  HCT 27.0* 30.7* 27.1* 26.5* 27.2*  MCV 71.6* 71.7* 72.7* 72.8* 73.7*  PLT 200 179 151 137* 710   Basic Metabolic Panel:  Recent Labs Lab 12/23/16 0430 12/23/16 2332 12/26/16 1009 12/27/16 0524 12/28/16 0437  NA 133* 133* 130* 133* 131*  K 4.7 5.7* 4.4 4.2 4.0  CL 107 104 96* 97* 95*  CO2 16* 18* 24 27 26   GLUCOSE 167* 231* 178* 161* 148*  BUN  100* 93* 53* 28* 19  CREATININE 7.17* 7.06* 6.39* 4.86* 4.44*  CALCIUM 9.2 9.1 8.7* 8.5* 8.7*  MG  --   --   --   --  2.1  PHOS 5.9* 5.9* 4.5 3.8 3.2   Liver Function Tests:  Recent Labs Lab 12/23/16 0430 12/23/16 2332 12/26/16 1009 12/27/16 0524 12/28/16 0437  AST  --   --   --   --  20  ALT  --  16  --   --  9*  ALKPHOS  --   --   --   --  53  BILITOT  --   --   --   --  0.6  PROT  --   --   --   --  6.6  ALBUMIN 2.9* 3.2* 2.9* 2.8* 3.0*   BNP (last 3 results)  Recent Labs  12/09/16 0148  BNP 170.3*   CBG:  Recent Labs Lab 12/27/16 1649 12/27/16 2104 12/27/16 2241 12/28/16 0742 12/28/16 1149  GLUCAP 227* 212* 221* 117* 234*      Time coordinating discharge: Over 30 minutes  SIGNED:  Vernell Leep, MD, FACP, FHM. [facility] Pager [phone]  If 7PM-7AM, please contact night-coverage [email or web address] Password [email or web address] 12/28/2016, 2:52 PM

## 2016-12-28 NOTE — Discharge Instructions (Signed)

## 2016-12-28 NOTE — Progress Notes (Signed)
S: No new CO O:BP (!) 121/50 (BP Location: Right Arm)   Pulse 69   Temp 98.3 F (36.8 C) (Oral)   Resp 17   Ht 5\' 10"  (1.778 m)   Wt 99.8 kg (220 lb)   SpO2 98%   BMI 31.57 kg/m   Intake/Output Summary (Last 24 hours) at 12/28/16 0711 Last data filed at 12/27/16 2235  Gross per 24 hour  Intake              726 ml  Output             2000 ml  Net            -1274 ml   Weight change: -0.9 kg (-1 lb 15.8 oz) POE:UMPNT and alert CVS: RRR Resp:clear Abd: + BS NTND Ext: 0 edema.  LUA AVF + bruit NEURO:CNI Ox3 no asterixis Rt IJ perm cath   . aspirin  81 mg Oral Daily  . calcium acetate  1,334 mg Oral TID WC  . darbepoetin (ARANESP) injection - DIALYSIS  60 mcg Intravenous Q Sat-HD  . insulin aspart  0-15 Units Subcutaneous TID WC  . insulin aspart  0-5 Units Subcutaneous QHS  . insulin aspart  8 Units Subcutaneous TID WC  . insulin glargine  25 Units Subcutaneous QHS  . isosorbide-hydrALAZINE  2 tablet Oral TID  . mouth rinse  15 mL Mouth Rinse BID  . metoprolol tartrate  50 mg Oral BID  . multivitamin  1 tablet Oral QHS  . polyethylene glycol  17 g Oral BID  . pravastatin  40 mg Oral Daily  . senna-docusate  1 tablet Oral BID  . sodium chloride flush  3 mL Intravenous Q12H   No results found. BMET    Component Value Date/Time   NA 131 (L) 12/28/2016 0437   K 4.0 12/28/2016 0437   CL 95 (L) 12/28/2016 0437   CO2 26 12/28/2016 0437   GLUCOSE 148 (H) 12/28/2016 0437   BUN 19 12/28/2016 0437   CREATININE 4.44 (H) 12/28/2016 0437   CALCIUM 8.7 (L) 12/28/2016 0437   GFRNONAA 10 (L) 12/28/2016 0437   GFRAA 11 (L) 12/28/2016 0437   CBC    Component Value Date/Time   WBC 9.2 12/28/2016 0437   RBC 3.69 (L) 12/28/2016 0437   HGB 8.2 (L) 12/28/2016 0437   HCT 27.2 (L) 12/28/2016 0437   PLT 174 12/28/2016 0437   MCV 73.7 (L) 12/28/2016 0437   MCH 22.2 (L) 12/28/2016 0437   MCHC 30.1 12/28/2016 0437   RDW 15.4 12/28/2016 0437   LYMPHSABS 2.3 12/28/2016 0437   MONOABS 1.1 (H) 12/28/2016 0437   EOSABS 0.4 12/28/2016 0437   BASOSABS 0.0 12/28/2016 0437     Assessment: 1. New ESRD sec HTN/DM 2. HTN 3. Anemia on aranesp and getting IV iron 4. Mild thrombocytopenia, improved 5. DM  Plan: 1. Outpt HD has been arranged at [facility] MWF 2nd shift and she should be there at 10:15am first tx on Friday 2. Note probable DC today Aivan Fillingim T

## 2016-12-29 LAB — PARATHYROID HORMONE, INTACT (NO CA): PTH: 249 pg/mL — AB (ref 15–65)

## 2017-01-13 ENCOUNTER — Encounter (HOSPITAL_COMMUNITY): Payer: Self-pay | Admitting: Nurse Practitioner

## 2017-01-13 ENCOUNTER — Emergency Department (HOSPITAL_COMMUNITY)
Admission: EM | Admit: 2017-01-13 | Discharge: 2017-01-13 | Disposition: A | Discharge status: Home / Self Care | Payer: Medicaid Other | Attending: Emergency Medicine | Admitting: Emergency Medicine

## 2017-01-13 ENCOUNTER — Emergency Department (HOSPITAL_COMMUNITY): Payer: Medicaid Other

## 2017-01-13 DIAGNOSIS — S82831A Other fracture of upper and lower end of right fibula, initial encounter for closed fracture: Secondary | ICD-10-CM | POA: Insufficient documentation

## 2017-01-13 DIAGNOSIS — I120 Hypertensive chronic kidney disease with stage 5 chronic kidney disease or end stage renal disease: Secondary | ICD-10-CM | POA: Insufficient documentation

## 2017-01-13 DIAGNOSIS — Z79899 Other long term (current) drug therapy: Secondary | ICD-10-CM | POA: Diagnosis not present

## 2017-01-13 DIAGNOSIS — Z992 Dependence on renal dialysis: Secondary | ICD-10-CM | POA: Diagnosis not present

## 2017-01-13 DIAGNOSIS — W109XXA Fall (on) (from) unspecified stairs and steps, initial encounter: Secondary | ICD-10-CM | POA: Insufficient documentation

## 2017-01-13 DIAGNOSIS — E119 Type 2 diabetes mellitus without complications: Secondary | ICD-10-CM | POA: Insufficient documentation

## 2017-01-13 DIAGNOSIS — S99911A Unspecified injury of right ankle, initial encounter: Secondary | ICD-10-CM | POA: Diagnosis present

## 2017-01-13 DIAGNOSIS — Y939 Activity, unspecified: Secondary | ICD-10-CM | POA: Diagnosis not present

## 2017-01-13 DIAGNOSIS — Y999 Unspecified external cause status: Secondary | ICD-10-CM | POA: Insufficient documentation

## 2017-01-13 DIAGNOSIS — Z7982 Long term (current) use of aspirin: Secondary | ICD-10-CM | POA: Diagnosis not present

## 2017-01-13 DIAGNOSIS — N186 End stage renal disease: Secondary | ICD-10-CM | POA: Diagnosis not present

## 2017-01-13 DIAGNOSIS — Y929 Unspecified place or not applicable: Secondary | ICD-10-CM | POA: Diagnosis not present

## 2017-01-13 DIAGNOSIS — S82891A Other fracture of right lower leg, initial encounter for closed fracture: Secondary | ICD-10-CM | POA: Insufficient documentation

## 2017-01-13 DIAGNOSIS — Z794 Long term (current) use of insulin: Secondary | ICD-10-CM | POA: Diagnosis not present

## 2017-01-13 HISTORY — DX: Type 2 diabetes mellitus without complications: E11.9

## 2017-01-13 MED ORDER — HYDROCODONE-ACETAMINOPHEN 5-325 MG PO TABS
1.0000 | ORAL_TABLET | Freq: Once | ORAL | Status: AC
  Administered 2017-01-13: 1 via ORAL
  Filled 2017-01-13: qty 1

## 2017-01-13 MED ORDER — OXYCODONE-ACETAMINOPHEN 5-325 MG PO TABS: 2.0000 | ORAL_TABLET | ORAL | 0 refills | Status: DC | PRN

## 2017-01-13 NOTE — Progress Notes (Signed)
Orthopedic Tech Progress Note Patient Details:  Phyllis Hernandez 1950/09/29 [id number]  Ortho Devices Type of Ortho Device: Short leg splint Ortho Device/Splint Location: applied short leg splint on pt right leg.  pt tolerated application very well.  Right Ankle/foot.  Ortho Device/Splint Interventions: Application, Adjustment   Kristopher Oppenheim 01/13/2017, 2:27 PM

## 2017-01-13 NOTE — ED Triage Notes (Signed)
Pt states yesterday was sitting in chair and got up but slid on something on the ground and twisted ankle. Pain worse today in right ankle, swelling noted. Pt sts she has had to limp because she is unable to bear weight.

## 2017-01-13 NOTE — Consult Note (Signed)
Reason for Consult:Ankle fx Referring Physician: Saniyyah Hernandez is an 66 y.o. female.  HPI: Phyllis Hernandez was at home yesterday when she slipped on something and fell. She had immediate right ankle pain but was able to bear weight. At time wore on the pain increased until she was not able to bear weight and she came to the ED for evaluation. X-rays showed a distal fibula fx with a medial mal avulsion fx. Orthopedic surgery was consulted.  Past Medical History:  Diagnosis Date  . Allergy   . Anemia   . Anemia in CKD (chronic kidney disease)   . Depression   . Diabetes mellitus without complication ([place])   . Hyperlipidemia   . Hypertension     Past Surgical History:  Procedure Laterality Date  . AV FISTULA PLACEMENT Left 12/23/2016   Procedure: LEFT UPPER EXTREMITY ARTERIOVENOUS (AV) FISTULA CREATION;  Surgeon: Elam Dutch, MD;  Location: [place];  Service: Vascular;  Laterality: Left;  . INSERTION OF DIALYSIS CATHETER Right 12/23/2016   Procedure: INSERTION OF DIALYSIS CATHETER;  Surgeon: Elam Dutch, MD;  Location: [place];  Service: Vascular;  Laterality: Right;  . TUBAL LIGATION      Family History  Problem Relation Age of Onset  . Hypertension Mother   . Diabetes Mother   . Hypertension Maternal Grandmother   . Diabetes Maternal Grandmother     Social History:  reports that she has never smoked. She has never used smokeless tobacco. Her alcohol and drug histories are not on file.  Allergies:  Allergies  Allergen Reactions  . No Known Allergies     Medications: I have reviewed the patient's current medications.  No results found for this or any previous visit (from the past 48 hour(s)).  Dg Ankle Complete Right  Result Date: 01/13/2017 CLINICAL DATA:  66 year old female with lateral ankle pain. She twisted her ankle yesterday evening. EXAM: RIGHT ANKLE - COMPLETE 3+ VIEW COMPARISON:  None. FINDINGS: Acute obliquely oriented fracture through the distal  fibula at the level of the tibial plafond. There is associated overlying soft tissue swelling. The ankle mortise remains congruent in the talar dome appears intact. A small osseous fragment is also noted adjacent to the medial malleolus consistent with an avulsion fracture. No ankle joint effusion. Minimal atherosclerotic calcifications noted at the common plantar artery. Calcaneal spurring is also noted incidentally. IMPRESSION: 1. Positive for nondisplaced bimalleolar ankle fracture. There is an obliquely oriented fracture through the distal fibula at the level of the tibial plafond, and a small avulsion fracture adjacent to the medial malleolus. Electronically Signed   By: Jacqulynn Cadet M.D.   On: 01/13/2017 12:12    Review of Systems  Constitutional: Negative for weight loss.  HENT: Negative for ear discharge, ear pain, hearing loss and tinnitus.   Eyes: Negative for blurred vision, double vision, photophobia and pain.  Respiratory: Negative for cough, sputum production and shortness of breath.   Cardiovascular: Negative for chest pain.  Gastrointestinal: Negative for abdominal pain, nausea and vomiting.  Genitourinary: Negative for dysuria, flank pain, frequency and urgency.  Musculoskeletal: Positive for joint pain (Right ankle). Negative for back pain, falls, myalgias and neck pain.  Neurological: Negative for dizziness, tingling, sensory change, focal weakness, loss of consciousness and headaches.  Endo/Heme/Allergies: Does not bruise/bleed easily.  Psychiatric/Behavioral: Negative for depression, memory loss and substance abuse. The patient is not nervous/anxious.    Blood pressure 134/68, pulse 81, temperature 98.2 F (36.8 C), temperature source  Oral, resp. rate 16, height 5\' 5"  (1.651 m), weight 99.8 kg (220 lb), SpO2 99 %. Physical Exam  Constitutional: She appears well-developed and well-nourished. No distress.  HENT:  Head: Normocephalic.  Eyes: Conjunctivae are normal. Right  eye exhibits no discharge. Left eye exhibits no discharge. No scleral icterus.  Cardiovascular: Normal rate and regular rhythm.   Respiratory: Effort normal. No respiratory distress.  Musculoskeletal:  RLE No traumatic wounds, ecchymosis, or rash  TTP lateral ankle, no TTP medial ankle or fibular head  No effusions  Knee stable to varus/ valgus and anterior/posterior stress  Sens DPN, SPN, TN intact  Motor EHL, ext, flex, evers 5/5  DP 2+, PT 1+, NP edema lateral ankle     Neurological: She is alert.  Skin: Skin is warm and dry. She is not diaphoretic.  Psychiatric: She has a normal mood and affect. Her behavior is normal.    Assessment/Plan: Right ankle fx -- Short leg splint and NWB. As long as she can safely ambulate on a RW she can discharge home and f/u with Dr. Erlinda Hong next week.    [name], PA-C Orthopedic Surgery [phone] 01/13/2017, 1:07 PM

## 2017-01-13 NOTE — ED Notes (Signed)
Ortho paged. 

## 2017-01-13 NOTE — Care Management (Signed)
ED CM received consult from [facility] on [place]. CM reviewed DME order. Notified Lamy awaiting the delivery of the walker to room prior to discharge from the ED.

## 2017-01-13 NOTE — ED Notes (Signed)
Pt received walker.

## 2017-01-13 NOTE — Care Management (Signed)
    Durable Medical Equipment        Start     Ordered   01/13/17 1349  For home use only DME Walker rolling  Once    Question:  Patient needs a walker to treat with the following condition  Answer:  Ankle fracture, bimalleolar, closed, right, initial encounter   01/13/17 1348

## 2017-01-13 NOTE — Discharge Instructions (Addendum)
Keep splint intact and dry.  Do not put weight down on right leg.  Keep leg elevated when possible.  Ice to ankle 30 minutes 4 times a day today through Sunday.

## 2017-01-13 NOTE — ED Provider Notes (Signed)
[facility] DEPT Provider Note   CSN: [id number] Arrival date & time: 01/13/17  1122   By signing my name below, I, Eunice Blase, attest that this documentation has been prepared under the direction and in the presence of Janetta Hora, PA-C. Electronically signed, Eunice Blase, ED Scribe. 01/13/17. 12:32 PM.  History   Chief Complaint Chief Complaint  Patient presents with  . Ankle Pain   The history is provided by the patient and medical records. No language interpreter was used.    Phyllis Hernandez is a 66 y.o. female dialysis pt with h/o DM and HTN presenting to the Emergency Department concerning R ankle pain s/p a fall that occurred yesterday. No head trauma/ LOC. She states she stumbled on steps and fell, twisting her ankle and falling on it in the process. Associated swelling, hobbled gait and lower RLE and R knee pains. She describes 10/10, constant aches worse with ambulation and standing. No other complaints at this time.   Past Medical History:  Diagnosis Date  . Allergy   . Anemia   . Anemia in CKD (chronic kidney disease)   . Depression   . Diabetes mellitus without complication ([place])   . Hyperlipidemia   . Hypertension     Patient Active Problem List   Diagnosis Date Noted  . ESRD (end stage renal disease) ([place]) 12/27/2016  . Elevated troponin 12/27/2016  . Anemia of chronic disease 12/27/2016  . Thrombocytopenia ([place]) 12/27/2016  . Constipation 12/27/2016  . Hypertensive emergency 12/09/2016  . Acute on chronic renal failure ([place]) 12/09/2016  . Type 2 diabetes mellitus with complication, without long-term current use of insulin ([place]) 02/27/2007  . Hyperlipidemia 02/27/2007  . Essential hypertension 02/27/2007    Past Surgical History:  Procedure Laterality Date  . AV FISTULA PLACEMENT Left 12/23/2016   Procedure: LEFT UPPER EXTREMITY ARTERIOVENOUS (AV) FISTULA CREATION;  Surgeon: Elam Dutch, MD;  Location: [facility];  Service: Vascular;   Laterality: Left;  . INSERTION OF DIALYSIS CATHETER Right 12/23/2016   Procedure: INSERTION OF DIALYSIS CATHETER;  Surgeon: Elam Dutch, MD;  Location: [facility];  Service: Vascular;  Laterality: Right;  . TUBAL LIGATION      OB History    No data available       Home Medications    Prior to Admission medications   Medication Sig Start Date End Date Taking? Authorizing Provider  aspirin 81 MG chewable tablet Chew 81 mg by mouth daily.    [provider]  bisacodyl (DULCOLAX) 10 MG suppository Place 1 suppository (10 mg total) rectally daily as needed for moderate constipation. 12/28/16   Hongalgi, Lenis Dickinson, MD  calcium acetate (PHOSLO) 667 MG capsule Take 2 capsules (1,334 mg total) by mouth 3 (three) times daily with meals. 12/28/16   Hongalgi, Lenis Dickinson, MD  ferrous sulfate 325 (65 FE) MG tablet Take 325 mg by mouth daily with breakfast.    [provider]  insulin aspart (NOVOLOG FLEXPEN) 100 UNIT/ML FlexPen Inject 0-15 Units into the skin 3 (three) times daily with meals. CBG < 70: Eat or drink something sweet and recheck. CBG 70 - 120: 0 units CBG 121 - 150: 2 units CBG 151 - 200: 3 units CBG 201 - 250: 5 units CBG 251 - 300: 8 units CBG 301 - 350: 11 units CBG 351 - 400: 15 units CBG > 400: call MD. 12/28/16   Modena Jansky, MD  Insulin Glargine (LANTUS) 100 UNIT/ML Solostar Pen Inject 25 Units  into the skin daily at 10 pm. 12/28/16   Modena Jansky, MD  Insulin Pen Needle 29G X 12MM MISC To be used per instructions 3 times a day with meals and at bedtime. 12/28/16   Hongalgi, Lenis Dickinson, MD  isosorbide-hydrALAZINE (BIDIL) 20-37.5 MG tablet Take 2 tablets by mouth 3 (three) times daily. 12/28/16   Hongalgi, Lenis Dickinson, MD  metoprolol (LOPRESSOR) 50 MG tablet Take 50 mg by mouth 2 (two) times daily.    [provider]  multivitamin (RENA-VIT) TABS tablet Take 1 tablet by mouth at bedtime. 12/28/16   Hongalgi, Lenis Dickinson, MD  polyethylene glycol (MIRALAX /  GLYCOLAX) packet Take 17 g by mouth 2 (two) times daily. 12/28/16   Hongalgi, Lenis Dickinson, MD  pravastatin (PRAVACHOL) 40 MG tablet Take 40 mg by mouth daily.    [provider]  senna-docusate (SENOKOT-S) 8.6-50 MG tablet Take 1 tablet by mouth 2 (two) times daily. 12/28/16   Hongalgi, Lenis Dickinson, MD    Family History Family History  Problem Relation Age of Onset  . Hypertension Mother   . Diabetes Mother   . Hypertension Maternal Grandmother   . Diabetes Maternal Grandmother     Social History Social History  Substance Use Topics  . Smoking status: Never Smoker  . Smokeless tobacco: Never Used  . Alcohol use Not on file     Allergies   No known allergies   Review of Systems Review of Systems  Constitutional: Negative for diaphoresis and fever.  HENT: Negative for facial swelling.   Gastrointestinal: Negative for nausea and vomiting.  Musculoskeletal: Positive for arthralgias, gait problem and joint swelling.  Skin: Negative for color change and wound.  Neurological: Negative for syncope, weakness and numbness.     Physical Exam Updated Vital Signs BP 134/68 (BP Location: Right Arm)   Pulse 81   Temp 98.2 F (36.8 C) (Oral)   Resp 16   Ht 5\' 5"  (1.651 m)   Wt 220 lb (99.8 kg)   SpO2 99%   BMI 36.61 kg/m   Physical Exam  Constitutional: She is oriented to person, place, and time. She appears well-developed and well-nourished. No distress.  HENT:  Head: Normocephalic and atraumatic.  Eyes: Pupils are equal, round, and reactive to light. Conjunctivae and EOM are normal. Right eye exhibits no discharge. Left eye exhibits no discharge. No scleral icterus.  Neck: Normal range of motion.  Cardiovascular: Normal rate.   Pulmonary/Chest: Effort normal. No respiratory distress.  Abdominal: She exhibits no distension.  Musculoskeletal: Normal range of motion. She exhibits tenderness. She exhibits no deformity.  RLE: Diffuse swelling of the ankle more prominent over  the lateral aspect. Tenderness over lateral ankle. Pt able to wiggle toes. NVI. Calf tenderness.  Neurological: She is alert and oriented to person, place, and time.  Skin: Skin is warm and dry.  Psychiatric: She has a normal mood and affect. Her behavior is normal.  Nursing note and vitals reviewed.    ED Treatments / Results  DIAGNOSTIC STUDIES: Oxygen Saturation is 99% on RA, NL by my interpretation.    COORDINATION OF CARE: 12:29 PM-Discussed next steps with pt. Pt verbalized understanding and is agreeable with the plan. Will order splint, crutches and Rx medication. Pt prepared for d/c, advised of symptomatic care at home, F/U instructions and return precautions.    Labs (all labs ordered are listed, but only abnormal results are displayed) Labs Reviewed - No data to display  EKG  EKG Interpretation  None       Radiology Dg Tibia/fibula Right  Result Date: 01/13/2017 CLINICAL DATA:  Fall.  Right knee, calf, and ankle pain after fall. EXAM: RIGHT TIBIA AND FIBULA - 2 VIEW COMPARISON:  Right ankle radiographs from the same day. FINDINGS: A minimally displaced evulsion fracture is again noted at the medial malleolus. A comminuted oblique fracture of the lateral malleolus demonstrates minimal displacement. A comminuted nondisplaced fracture is present in the proximal fibula. The knee is located. No other significant proximal tibial fractures are present. Degenerative changes are noted at the knee. IMPRESSION: 1. Evulsion fracture of the medial malleolus and oblique fracture of the lateral malleolus suggesting eversion injury. 2. Nondisplaced comminuted fracture of the proximal fibula. Electronically Signed   By: San Morelle M.D.   On: 01/13/2017 13:26   Dg Ankle Complete Right  Result Date: 01/13/2017 CLINICAL DATA:  66 year old female with lateral ankle pain. She twisted her ankle yesterday evening. EXAM: RIGHT ANKLE - COMPLETE 3+ VIEW COMPARISON:  None. FINDINGS: Acute  obliquely oriented fracture through the distal fibula at the level of the tibial plafond. There is associated overlying soft tissue swelling. The ankle mortise remains congruent in the talar dome appears intact. A small osseous fragment is also noted adjacent to the medial malleolus consistent with an avulsion fracture. No ankle joint effusion. Minimal atherosclerotic calcifications noted at the common plantar artery. Calcaneal spurring is also noted incidentally. IMPRESSION: 1. Positive for nondisplaced bimalleolar ankle fracture. There is an obliquely oriented fracture through the distal fibula at the level of the tibial plafond, and a small avulsion fracture adjacent to the medial malleolus. Electronically Signed   By: Jacqulynn Cadet M.D.   On: 01/13/2017 12:12    Procedures Procedures (including critical care time)  Medications Ordered in ED Medications  HYDROcodone-acetaminophen (NORCO/VICODIN) 5-325 MG per tablet 1 tablet (1 tablet Oral Given 01/13/17 1253)     Initial Impression / Assessment and Plan / ED Course  I have reviewed the triage vital signs and the nursing notes.  Pertinent labs & imaging results that were available during my care of the patient were reviewed by me and considered in my medical decision making (see chart for details).  66 year old female presents with R ankle pain and swelling after a mechanical fall. Xray of ankle remarkable for bimalleolar fracture. She has calf tenderness so tib/fib was ordered as well which showed a proximal fibula fracture as well. Consulted Orion Crook PA-C with ortho who came to see pt. She was splinted and crutches were ordered. She is to be NWB until she follows up with Dr. Erlinda Hong. Pain medicine rx given. Return precautions given.  Final Clinical Impressions(s) / ED Diagnoses   Final diagnoses:  Closed right ankle fracture, initial encounter  Other closed fracture of proximal end of right fibula, initial encounter    New  Prescriptions New Prescriptions   No medications on file   I personally performed the services described in this documentation, which was scribed in my presence. The recorded information has been reviewed and is accurate.    Recardo Evangelist, PA-C 01/13/17 1654    Isla Pence, MD 01/15/17 [phone]

## 2017-01-24 ENCOUNTER — Encounter: Payer: Self-pay | Admitting: Vascular Surgery

## 2017-01-27 ENCOUNTER — Encounter (HOSPITAL_COMMUNITY): Payer: Medicaid Other

## 2017-02-02 ENCOUNTER — Encounter: Payer: Medicaid Other | Admitting: Vascular Surgery

## 2017-02-09 ENCOUNTER — Ambulatory Visit (INDEPENDENT_AMBULATORY_CARE_PROVIDER_SITE_OTHER): Payer: Medicaid Other | Admitting: Orthopaedic Surgery

## 2017-02-09 ENCOUNTER — Ambulatory Visit (INDEPENDENT_AMBULATORY_CARE_PROVIDER_SITE_OTHER): Payer: Medicaid Other

## 2017-02-09 ENCOUNTER — Encounter (INDEPENDENT_AMBULATORY_CARE_PROVIDER_SITE_OTHER): Payer: Self-pay | Admitting: Orthopaedic Surgery

## 2017-02-09 DIAGNOSIS — S8261XA Displaced fracture of lateral malleolus of right fibula, initial encounter for closed fracture: Secondary | ICD-10-CM | POA: Diagnosis not present

## 2017-02-09 NOTE — Progress Notes (Signed)
Office Visit Note   Patient: Phyllis Hernandez           Date of Birth: 01-20-1951           MRN: [id number] Visit Date: 02/09/2017              Requested by: Everardo Beals, NP [facility] [address] [place], [place] [address] PCP: Everardo Beals, NP   Assessment & Plan: Visit Diagnoses:  1. Closed displaced fracture of lateral malleolus of right fibula, initial encounter     Plan: Patient is 1 month status post a minimally displaced fibula fracture. X-ray show evidence of early healing. Ankle mortise is congruent. At this point continue with nonoperative treatment. Tolerated in a Cam Walker. Follow-up in 4 weeks. Physical therapy referral given. Questions encouraged and answered.  Follow-Up Instructions: Return in about 4 weeks (around 03/09/2017).   Orders:  Orders Placed This Encounter  Procedures  . XR Ankle Complete Right   No orders of the defined types were placed in this encounter.     Procedures: No procedures performed   Clinical Data: No additional findings.   Subjective: Chief Complaint  Patient presents with  . Right Ankle - Pain    Patient is a 66 year old female who comes in 1 month status post a minimally displaced bimalleolar ankle fracture. She is just following up today. She denies any numbness and tingling. She lost her paperwork and therefore did not follow-up until now. She's been weightbearing on this ankle since then.     Review of Systems  Constitutional: Negative.   HENT: Negative.   Eyes: Negative.   Respiratory: Negative.   Cardiovascular: Negative.   Endocrine: Negative.   Musculoskeletal: Negative.   Neurological: Negative.   Hematological: Negative.   Psychiatric/Behavioral: Negative.   All other systems reviewed and are negative.    Objective: Vital Signs: There were no vitals taken for this visit.  Physical Exam  Constitutional: She is oriented to person, place, and time. She appears  well-developed and well-nourished.  HENT:  Head: Normocephalic and atraumatic.  Eyes: EOM are normal.  Neck: Neck supple.  Pulmonary/Chest: Effort normal.  Abdominal: Soft.  Neurological: She is alert and oriented to person, place, and time.  Skin: Skin is warm. Capillary refill takes less than 2 seconds.  Psychiatric: She has a normal mood and affect. Her behavior is normal. Judgment and thought content normal.  Nursing note and vitals reviewed.   Ortho Exam Right ankle exam shows mild swelling. There is no skin changes. Fibula is non-tender essentially. X-rays from today shows a subacute healing fracture is minimally displaced. Specialty Comments:  No specialty comments available.  Imaging: Xr Ankle Complete Right  Result Date: 02/09/2017 Subacute healing minimally displaced lateral malleolus fracture    PMFS History: Patient Active Problem List   Diagnosis Date Noted  . Closed displaced fracture of lateral malleolus of right fibula 02/09/2017  . ESRD (end stage renal disease) ([place]) 12/27/2016  . Elevated troponin 12/27/2016  . Anemia of chronic disease 12/27/2016  . Thrombocytopenia ([place]) 12/27/2016  . Constipation 12/27/2016  . Hypertensive emergency 12/09/2016  . Acute on chronic renal failure ([place]) 12/09/2016  . Type 2 diabetes mellitus with complication, without long-term current use of insulin ([place]) 02/27/2007  . Hyperlipidemia 02/27/2007  . Essential hypertension 02/27/2007   Past Medical History:  Diagnosis Date  . Allergy   . Anemia   . Anemia in CKD (chronic kidney disease)   . Depression   .  Diabetes mellitus without complication ([place])   . Hyperlipidemia   . Hypertension     Family History  Problem Relation Age of Onset  . Hypertension Mother   . Diabetes Mother   . Hypertension Maternal Grandmother   . Diabetes Maternal Grandmother     Past Surgical History:  Procedure Laterality Date  . AV FISTULA PLACEMENT Left 12/23/2016   Procedure: LEFT  UPPER EXTREMITY ARTERIOVENOUS (AV) FISTULA CREATION;  Surgeon: Elam Dutch, MD;  Location: [facility];  Service: Vascular;  Laterality: Left;  . INSERTION OF DIALYSIS CATHETER Right 12/23/2016   Procedure: INSERTION OF DIALYSIS CATHETER;  Surgeon: Elam Dutch, MD;  Location: [facility];  Service: Vascular;  Laterality: Right;  . TUBAL LIGATION     Social History   Occupational History  . Not on file.   Social History Main Topics  . Smoking status: Never Smoker  . Smokeless tobacco: Never Used  . Alcohol use Not on file  . Drug use: Unknown  . Sexual activity: Not on file

## 2017-03-09 ENCOUNTER — Ambulatory Visit (INDEPENDENT_AMBULATORY_CARE_PROVIDER_SITE_OTHER): Payer: Medicaid Other | Admitting: Orthopaedic Surgery

## 2017-03-14 ENCOUNTER — Encounter (HOSPITAL_COMMUNITY): Payer: Medicaid Other

## 2017-03-16 ENCOUNTER — Encounter: Payer: Medicaid Other | Admitting: Vascular Surgery

## 2017-03-21 ENCOUNTER — Encounter: Payer: Self-pay | Admitting: Physical Therapy

## 2017-03-21 ENCOUNTER — Ambulatory Visit: Payer: Medicare Other | Attending: Orthopaedic Surgery | Admitting: Physical Therapy

## 2017-03-21 DIAGNOSIS — R262 Difficulty in walking, not elsewhere classified: Secondary | ICD-10-CM | POA: Diagnosis present

## 2017-03-21 DIAGNOSIS — M25571 Pain in right ankle and joints of right foot: Secondary | ICD-10-CM | POA: Diagnosis not present

## 2017-03-21 DIAGNOSIS — M25671 Stiffness of right ankle, not elsewhere classified: Secondary | ICD-10-CM

## 2017-03-21 NOTE — Therapy (Addendum)
[place], [place], [address] Phone: [phone]   Fax:  [phone]  Physical Therapy Evaluation  Patient Details  Name: Phyllis Hernandez MRN: [id number] Date of Birth: [birth year] Referring Provider: Leandrew Koyanagi, MD  Encounter Date: 03/21/2017      PT End of Session - 03/21/17 1015    Visit Number 1   Number of Visits 4   Date for PT Re-Evaluation 04/21/17   Authorization Type Medicaid- 3 visits 10/2-11/2 followed by ERO for extension request   PT Start Time 1015   PT Stop Time 1107   PT Time Calculation (min) 52 min   Activity Tolerance Patient tolerated treatment well   Behavior During Therapy [facility] for tasks assessed/performed      Past Medical History:  Diagnosis Date  . Allergy   . Anemia   . Anemia in CKD (chronic kidney disease)   . Depression   . Diabetes mellitus without complication ([place])   . Hyperlipidemia   . Hypertension     Past Surgical History:  Procedure Laterality Date  . AV FISTULA PLACEMENT Left 12/23/2016   Procedure: LEFT UPPER EXTREMITY ARTERIOVENOUS (AV) FISTULA CREATION;  Surgeon: Elam Dutch, MD;  Location: [facility];  Service: Vascular;  Laterality: Left;  . INSERTION OF DIALYSIS CATHETER Right 12/23/2016   Procedure: INSERTION OF DIALYSIS CATHETER;  Surgeon: Elam Dutch, MD;  Location: [facility];  Service: Vascular;  Laterality: Right;  . TUBAL LIGATION      There were no vitals filed for this visit.       Subjective Assessment - 03/21/17 1019    Subjective currently on dialysis- MWF. Reports ankle is feeling better. Uses RW full time, feels that balance is poor. Reports wearing boot most of the time. Lost one of the straps for her boot. Just minor pain.    Patient Stated Goals able to clean house, running errands, go to church, social events   Currently in Pain? Yes   Pain Score 8    Pain Location Ankle   Pain Orientation Right   Pain Descriptors /  Indicators Sharp   Aggravating Factors  weigh bearing too long.    Pain Relieving Factors elevating, rest            [facility] PT Assessment - 03/21/17 0001      Assessment   Medical Diagnosis R ankle fracture   Referring Provider Leandrew Koyanagi, MD   Onset Date/Surgical Date --  Late August 2018   Hand Dominance Right   Next MD Visit not scheduled   Prior Therapy no     Precautions   Precautions Fall     Restrictions   Other Position/Activity Restrictions WBAT     Balance Screen   Has the patient fallen in the past 6 months Yes   How many times? 1   Has the patient had a decrease in activity level because of a fear of falling?  Yes   Is the patient reluctant to leave their home because of a fear of falling?  No     Home Social worker Private residence   Living Arrangements Alone   Additional Comments no staris at home     Prior Function   Level of Independence Independent   Vocation --  not working     Cognition   Overall Cognitive Status Within Functional Limits for tasks assessed     Observation/Other Assessments   Focus on  Therapeutic Outcomes (FOTO)  90% limited at eval     Observation/Other Assessments-Edema    Edema Circumferential  notable edema in R ankle v L     Sensation   Additional Comments bilateral feet N/t     ROM / Strength   AROM / PROM / Strength AROM;Strength     AROM   AROM Assessment Site Ankle   Right/Left Ankle Right   Right Ankle Dorsiflexion 6   Right Ankle Plantar Flexion 42   Right Ankle Inversion 22   Right Ankle Eversion 4     Strength   Strength Assessment Site Ankle   Right/Left Ankle Right   Right Ankle Dorsiflexion 5/5   Right Ankle Plantar Flexion 3-/5  unable to perform heel raise   Right Ankle Inversion 4-/5   Right Ankle Eversion 4+/5            Objective measurements completed on examination: See above findings.          [facility] PT Treatment/Exercise - 03/21/17 0001       Exercises   Exercises Ankle     Modalities   Modalities Cryotherapy     Cryotherapy   Number Minutes Cryotherapy 10 Minutes   Cryotherapy Location Ankle   Type of Cryotherapy Ice pack     Ankle Exercises: Stretches   Gastroc Stretch Limitations long sitting with towel   Other Stretch heel slide with knee bent     Ankle Exercises: Seated   Towel Crunch Limitations toe towel scrunches   Heel Raises Limitations heel/toe raises   Other Seated Ankle Exercises resisted PF, red tband                PT Education - 03/21/17 1058    Education provided Yes   Education Details anatomy of condition, POC, HEP, exercise form/rationale, medicaid process for visits   Person(s) Educated Patient   Methods Explanation;Demonstration;Tactile cues;Verbal cues;Handout   Comprehension Verbalized understanding;Returned demonstration;Verbal cues required;Tactile cues required;Need further instruction          PT Short Term Goals - 03/21/17 1320      PT SHORT TERM GOAL #1   Title Pt will be able to ambulate household distances comfortably with rolling walker & tennis shoes   Baseline wearing CAM boot at eval   Time 4   Period Weeks   Status New   Target Date 04/21/17     PT SHORT TERM GOAL #2   Title Pt will demo DF ROM to 10 deg for functional range necessary for gait pattern and sit<>stand from standard chair   Baseline see flowsheet   Time 4   Period Weeks   Status New   Target Date 04/21/17     PT SHORT TERM GOAL #3   Title Pt will be independent in HEP for continued improvement & strengthening outside of treatment times   Baseline began establishing at eval & will progress as appropriate   Time 4   Period Weeks   Status New   Target Date 04/21/17           PT Long Term Goals - 03/21/17 1323      PT LONG TERM GOAL #1   Title Pt will be able to return to independently running errands in the community without limitation by ankle pain   Baseline unable at eval,  requires assistance   Time 9   Period Weeks   Status New   Target Date 05/26/17     PT  LONG TERM GOAL #2   Title FOTO to 64% limitation to indicate significant improvement in functional ability   Baseline 90% limited at eval   Time 9   Period Weeks   Status New   Target Date 05/26/17     PT LONG TERM GOAL #3   Title Pt will be able to independently complete household chores without limitation by ankle pain   Baseline unable at eval   Time 9   Period Weeks   Status New   Target Date 05/26/17     PT LONG TERM GOAL #4   Title Pt will be able to ambulate at least 200 ft with use of SPC with good balance control to reduce need for AD in functional ambulation   Baseline using RW full time at eval   Time 9   Period Weeks   Status New   Target Date 05/26/17                Plan - 03/21/17 1048    Clinical Impression Statement Pt presents to PT with complaints of limited functional mobility s/p R ankle fracture. She has not attended a follow up with her physician (recommended around 9/20) since she was referred to PT, I advised her to schedule f/u. I also asked pt to bring a pair of tennis shoes to treatments to we can work on gait pattern without CAM boot. Pt demo good ROM but will benefit from improvements for functional range. Pt will also benefit from skilled PT to improve gait pattern and improve balance to decrease risk of fall and further injury.    History and Personal Factors relevant to plan of care: on dialysis, anemia, HTN, depression   Clinical Presentation Stable   Clinical Presentation due to: n/a   Clinical Decision Making Low   Rehab Potential Good   PT Frequency 1x / week   PT Duration 3 weeks   PT Treatment/Interventions ADLs/Self Care Home Management;Cryotherapy;Electrical Stimulation;Functional mobility training;Stair training;Gait training;Traction;Moist Heat;Therapeutic activities;Therapeutic exercise;Balance training;Neuromuscular  re-education;Patient/family education;Passive range of motion;Manual techniques;Dry needling;Taping;Vasopneumatic Device   PT Next Visit Plan nu step, gait pattern with RW & tennis shoes   PT Home Exercise Plan towel scrunches, heel/toe raises, gastroc stretch, resisted PF   Consulted and Agree with Plan of Care Patient     GCODE: FOTO 90% limited Current: CM Goal: CL   Patient will benefit from skilled therapeutic intervention in order to improve the following deficits and impairments:  Decreased range of motion, Difficulty walking, Increased muscle spasms, Obesity, Decreased activity tolerance, Pain, Improper body mechanics, Impaired flexibility, Decreased balance, Decreased strength, Impaired sensation, Postural dysfunction, Increased edema  Visit Diagnosis: Pain in right ankle and joints of right foot - Plan: PT plan of care cert/re-cert  Stiffness of right ankle, not elsewhere classified - Plan: PT plan of care cert/re-cert  Difficulty in walking, not elsewhere classified - Plan: PT plan of care cert/re-cert     Problem List Patient Active Problem List   Diagnosis Date Noted  . Closed displaced fracture of lateral malleolus of right fibula 02/09/2017  . ESRD (end stage renal disease) ([place]) 12/27/2016  . Elevated troponin 12/27/2016  . Anemia of chronic disease 12/27/2016  . Thrombocytopenia ([place]) 12/27/2016  . Constipation 12/27/2016  . Hypertensive emergency 12/09/2016  . Acute on chronic renal failure ([place]) 12/09/2016  . Type 2 diabetes mellitus with complication, without long-term current use of insulin ([place]) 02/27/2007  . Hyperlipidemia 02/27/2007  . Essential hypertension 02/27/2007  Shanti Agresti C. Hanna Ra PT, DPT 03/21/17 1:30 PM   [facility] [address] [place], [place], [address] Phone: [phone]   Fax:  [phone]  Name: Phyllis Hernandez MRN: [id number] Date of Birth: 1950-06-26

## 2017-04-04 ENCOUNTER — Ambulatory Visit (INDEPENDENT_AMBULATORY_CARE_PROVIDER_SITE_OTHER): Payer: Medicaid Other | Admitting: Orthopaedic Surgery

## 2017-04-04 ENCOUNTER — Encounter (INDEPENDENT_AMBULATORY_CARE_PROVIDER_SITE_OTHER): Payer: Self-pay | Admitting: Orthopaedic Surgery

## 2017-04-04 ENCOUNTER — Ambulatory Visit (INDEPENDENT_AMBULATORY_CARE_PROVIDER_SITE_OTHER): Payer: Medicaid Other

## 2017-04-04 DIAGNOSIS — S8261XD Displaced fracture of lateral malleolus of right fibula, subsequent encounter for closed fracture with routine healing: Secondary | ICD-10-CM | POA: Diagnosis not present

## 2017-04-04 NOTE — Progress Notes (Signed)
Patient is almost 2 months status post right lateral malleolar ankle fracture. She denies any pain. She is currently working with physical therapy and progressing. Doesn't have any real complaints.  Exam is stable and benign. No tenderness palpation. X-ray show abundant callus formation of lateral malleolus fracture.  Discontinue Cam Walker. ASO brace was given today. Continue physical therapy until she has met her goals. Questions encouraged and answered. Follow-up as needed.

## 2017-04-06 ENCOUNTER — Ambulatory Visit: Payer: Medicare Other | Admitting: Physical Therapy

## 2017-04-06 DIAGNOSIS — M25571 Pain in right ankle and joints of right foot: Secondary | ICD-10-CM | POA: Diagnosis not present

## 2017-04-06 DIAGNOSIS — R262 Difficulty in walking, not elsewhere classified: Secondary | ICD-10-CM

## 2017-04-06 DIAGNOSIS — M25671 Stiffness of right ankle, not elsewhere classified: Secondary | ICD-10-CM

## 2017-04-06 NOTE — Therapy (Signed)
[place], [place], [address] Phone: [phone]   Fax:  [phone]  Physical Therapy Treatment  Patient Details  Name: Phyllis Hernandez MRN: [id number] Date of Birth: 06-16-1951 Referring Provider: Leandrew Koyanagi, MD  Encounter Date: 04/06/2017      PT End of Session - 04/06/17 1141    Visit Number 2   Number of Visits 4   Date for PT Re-Evaluation 04/21/17   Authorization Type Medicaid- 3 visits 10/2-11/2 followed by ERO for extension request   PT Start Time 1111   PT Stop Time 1151   PT Time Calculation (min) 40 min      Past Medical History:  Diagnosis Date  . Allergy   . Anemia   . Anemia in CKD (chronic kidney disease)   . Depression   . Diabetes mellitus without complication ([place])   . Hyperlipidemia   . Hypertension     Past Surgical History:  Procedure Laterality Date  . AV FISTULA PLACEMENT Left 12/23/2016   Procedure: LEFT UPPER EXTREMITY ARTERIOVENOUS (AV) FISTULA CREATION;  Surgeon: Elam Dutch, MD;  Location: [facility];  Service: Vascular;  Laterality: Left;  . INSERTION OF DIALYSIS CATHETER Right 12/23/2016   Procedure: INSERTION OF DIALYSIS CATHETER;  Surgeon: Elam Dutch, MD;  Location: [facility];  Service: Vascular;  Laterality: Right;  . TUBAL LIGATION      There were no vitals filed for this visit.                       [facility] Adult PT Treatment/Exercise - 04/06/17 0001      Ambulation/Gait   Stairs Yes   Stairs Assistance 6: Modified independent (Device/Increase time)   Stair Management Technique One rail Right;One rail Left   Number of Stairs 8   Height of Stairs 6   Gait Comments gait with RW and ASO then [facility] with ASO - increased time spent with cues for [facility] sequencing with step through pattern , up and down reciprocal stairs with SPC and 1 HR , forward gait in parallel bars with left hand simulating walking with [facility]      Self-Care   Self-Care  Other Self-Care Comments   Other Self-Care Comments  Don/ doffing of ASO, pt requires cues to return demonstration      Therapeutic Activites     --     Ankle Exercises: Standing   Other Standing Ankle Exercises heel raises, tandem trials                   PT Short Term Goals - 03/21/17 1320      PT SHORT TERM GOAL #1   Title Pt will be able to ambulate household distances comfortably with rolling walker & tennis shoes   Baseline wearing CAM boot at eval   Time 4   Period Weeks   Status New   Target Date 04/21/17     PT SHORT TERM GOAL #2   Title Pt will demo DF ROM to 10 deg for functional range necessary for gait pattern and sit<>stand from standard chair   Baseline see flowsheet   Time 4   Period Weeks   Status New   Target Date 04/21/17     PT SHORT TERM GOAL #3   Title Pt will be independent in HEP for continued improvement & strengthening outside of treatment times   Baseline began establishing at eval & will progress as  appropriate   Time 4   Period Weeks   Status New   Target Date 04/21/17           PT Long Term Goals - 03/21/17 1323      PT LONG TERM GOAL #1   Title Pt will be able to return to independently running errands in the community without limitation by ankle pain   Baseline unable at eval, requires assistance   Time 9   Period Weeks   Status New   Target Date 05/26/17     PT LONG TERM GOAL #2   Title FOTO to 64% limitation to indicate significant improvement in functional ability   Baseline 90% limited at eval   Time 9   Period Weeks   Status New   Target Date 05/26/17     PT LONG TERM GOAL #3   Title Pt will be able to independently complete household chores without limitation by ankle pain   Baseline unable at eval   Time 9   Period Weeks   Status New   Target Date 05/26/17     PT LONG TERM GOAL #4   Title Pt will be able to ambulate at least 200 ft with use of SPC with good balance control to reduce need for AD in  functional ambulation   Baseline using RW full time at eval   Time 9   Period Weeks   Status New   Target Date 05/26/17               Plan - 04/06/17 1205    Clinical Impression Statement Pt arrives with RW and ASO donned incorrectly. She saw MD who discontinued her boot, gave her an ASO and released her.  Time spent on educating pt on proper ASO donning and doffing. She brought tennis shoes and was able to fit in them with ASO. We worked on gait with ASO and RW and she did very well. Began [facility] training with difficulty with sequencing. She was a little off balance with turns without AD so advised her to continue with RW and ASO and we will continue with gait training and strengthening. She plans to purchase new tennis shoes and a cane. She was able to climp reciprocal stairs up and down with 1 HR and the SPC without pain with good safety. Began tandem balance and asked to to try at home with her RW.    PT Next Visit Plan nu step, gait pattern with RW/SPC & tennis shoes   PT Home Exercise Plan towel scrunches, heel/toe raises, gastroc stretch, resisted PF, tandem balance with RW    Consulted and Agree with Plan of Care Patient      Patient will benefit from skilled therapeutic intervention in order to improve the following deficits and impairments:  Decreased range of motion, Difficulty walking, Increased muscle spasms, Obesity, Decreased activity tolerance, Pain, Improper body mechanics, Impaired flexibility, Decreased balance, Decreased strength, Impaired sensation, Postural dysfunction, Increased edema  Visit Diagnosis: Pain in right ankle and joints of right foot  Stiffness of right ankle, not elsewhere classified  Difficulty in walking, not elsewhere classified     Problem List Patient Active Problem List   Diagnosis Date Noted  . Closed displaced fracture of lateral malleolus of right fibula 02/09/2017  . ESRD (end stage renal disease) ([place]) 12/27/2016  . Elevated  troponin 12/27/2016  . Anemia of chronic disease 12/27/2016  . Thrombocytopenia ([place]) 12/27/2016  . Constipation 12/27/2016  . Hypertensive emergency  12/09/2016  . Acute on chronic renal failure ([place]) 12/09/2016  . Type 2 diabetes mellitus with complication, without long-term current use of insulin ([place]) 02/27/2007  . Hyperlipidemia 02/27/2007  . Essential hypertension 02/27/2007    Dorene Ar, PTA 04/06/2017, 12:26 PM  [facility] [address] [place], [place], [address] Phone: [phone]   Fax:  [phone]  Name: Phyllis Hernandez MRN: [id number] Date of Birth: Jun 11, 1951

## 2017-04-13 ENCOUNTER — Encounter: Payer: Self-pay | Admitting: Physical Therapy

## 2017-04-13 ENCOUNTER — Ambulatory Visit: Payer: Medicare Other | Admitting: Physical Therapy

## 2017-04-13 DIAGNOSIS — M25571 Pain in right ankle and joints of right foot: Secondary | ICD-10-CM | POA: Diagnosis not present

## 2017-04-13 DIAGNOSIS — R262 Difficulty in walking, not elsewhere classified: Secondary | ICD-10-CM

## 2017-04-13 DIAGNOSIS — M25671 Stiffness of right ankle, not elsewhere classified: Secondary | ICD-10-CM

## 2017-04-13 NOTE — Therapy (Signed)
[place], [place], [address] Phone: [phone]   Fax:  [phone]  Physical Therapy Treatment  Patient Details  Name: Phyllis Hernandez MRN: [id number] Date of Birth: 1951/02/19 Referring Provider: Leandrew Koyanagi, MD  Encounter Date: 04/13/2017      PT End of Session - 04/13/17 1023    Visit Number 3   Number of Visits 4   Date for PT Re-Evaluation 04/21/17   Authorization Type Medicaid- 3 visits 10/2-11/2 followed by ERO for extension request   PT Start Time 1021  pt arrived late   PT Stop Time 1059   PT Time Calculation (min) 38 min   Activity Tolerance Patient tolerated treatment well   Behavior During Therapy [facility] for tasks assessed/performed      Past Medical History:  Diagnosis Date  . Allergy   . Anemia   . Anemia in CKD (chronic kidney disease)   . Depression   . Diabetes mellitus without complication ([place])   . Hyperlipidemia   . Hypertension     Past Surgical History:  Procedure Laterality Date  . AV FISTULA PLACEMENT Left 12/23/2016   Procedure: LEFT UPPER EXTREMITY ARTERIOVENOUS (AV) FISTULA CREATION;  Surgeon: Elam Dutch, MD;  Location: [facility];  Service: Vascular;  Laterality: Left;  . INSERTION OF DIALYSIS CATHETER Right 12/23/2016   Procedure: INSERTION OF DIALYSIS CATHETER;  Surgeon: Elam Dutch, MD;  Location: [facility];  Service: Vascular;  Laterality: Right;  . TUBAL LIGATION      There were no vitals filed for this visit.      Subjective Assessment - 04/13/17 1024    Subjective pt reports ankle is feeling good, denies pain. Has been using cane full time, no longer using RW. Balance still feels kind of off but I am getting there.    Patient Stated Goals able to clean house, running errands, go to church, social events   Currently in Pain? No/denies            [facility] PT Assessment - 04/13/17 0001      AROM   Right Ankle Dorsiflexion 10   Right Ankle  Plantar Flexion 36   Right Ankle Inversion 14   Right Ankle Eversion 10                     [facility] Adult PT Treatment/Exercise - 04/13/17 0001      Ankle Exercises: Aerobic   Stationary Bike nu step 5 min L5     Ankle Exercises: Standing   SLS in parallel bars   Balance Beam tandem walking   Other Standing Ankle Exercises heel/toe raises     Ankle Exercises: Seated   BAPS Level 3   Other Seated Ankle Exercises sit<>stand without UE use                PT Education - 04/13/17 1059    Education provided Yes   Education Details review of goals, FOTO, gait pattern, exercise form/rationale   Person(s) Educated Patient   Methods Explanation;Demonstration;Tactile cues;Verbal cues;Handout   Comprehension Verbalized understanding;Returned demonstration;Verbal cues required;Tactile cues required;Need further instruction          PT Short Term Goals - 04/13/17 1025      PT SHORT TERM GOAL #1   Title Pt will be able to ambulate household distances comfortably with rolling walker & tennis shoes   Baseline SPC and tennis shoes   Status Achieved  PT SHORT TERM GOAL #2   Title Pt will demo DF ROM to 10 deg for functional range necessary for gait pattern and sit<>stand from standard chair   Baseline 10   Status Achieved     PT SHORT TERM GOAL #3   Title Pt will be independent in HEP for continued improvement & strengthening outside of treatment times   Baseline exercises established thus far are going well   Status Achieved           PT Long Term Goals - 04/13/17 1026      PT LONG TERM GOAL #1   Title Pt will be able to return to independently running errands in the community without limitation by ankle pain   Baseline is able to do independently and without limitation   Status Achieved     PT LONG TERM GOAL #2   Title FOTO to 64% limitation to indicate significant improvement in functional ability   Baseline 41% limitation   Status Achieved      PT LONG TERM GOAL #3   Title Pt will be able to independently complete household chores without limitation by ankle pain   Baseline not limited by ankle pain   Status Achieved     PT LONG TERM GOAL #4   Title Pt will be able to ambulate at least 200 ft with use of SPC with good balance control to reduce need for AD in functional ambulation   Baseline able to demo with good control   Status Achieved               Plan - 04/13/17 1043    Clinical Impression Statement Pt demo good balance and control in standing/walking. Is confident in her ability to complete all activities and denies ankle limitations. Discussed wear of ASO is on an as-needed basis.    PT Treatment/Interventions ADLs/Self Care Home Management;Cryotherapy;Electrical Stimulation;Functional mobility training;Stair training;Gait training;Traction;Moist Heat;Therapeutic activities;Therapeutic exercise;Balance training;Neuromuscular re-education;Patient/family education;Passive range of motion;Manual techniques;Dry needling;Taping;Vasopneumatic Device   PT Next Visit Plan challenge balance, D/C   PT Home Exercise Plan towel scrunches, heel/toe raises, gastroc stretch, resisted PF, tandem balance with RW, heel/toe raises standing, SLS   Consulted and Agree with Plan of Care Patient      Patient will benefit from skilled therapeutic intervention in order to improve the following deficits and impairments:  Decreased range of motion, Difficulty walking, Increased muscle spasms, Obesity, Decreased activity tolerance, Pain, Improper body mechanics, Impaired flexibility, Decreased balance, Decreased strength, Impaired sensation, Postural dysfunction, Increased edema  Visit Diagnosis: Pain in right ankle and joints of right foot  Stiffness of right ankle, not elsewhere classified  Difficulty in walking, not elsewhere classified     Problem List Patient Active Problem List   Diagnosis Date Noted  . Closed displaced  fracture of lateral malleolus of right fibula 02/09/2017  . ESRD (end stage renal disease) ([place]) 12/27/2016  . Elevated troponin 12/27/2016  . Anemia of chronic disease 12/27/2016  . Thrombocytopenia ([place]) 12/27/2016  . Constipation 12/27/2016  . Hypertensive emergency 12/09/2016  . Acute on chronic renal failure ([place]) 12/09/2016  . Type 2 diabetes mellitus with complication, without long-term current use of insulin ([place]) 02/27/2007  . Hyperlipidemia 02/27/2007  . Essential hypertension 02/27/2007  Abygayle Deltoro C. Ruslan Mccabe PT, DPT 04/13/17 11:00 AM   [place], [place], [address] Phone: [phone]   Fax:  [phone]  Name: Phyllis Hernandez MRN: [id number] Date of Birth: 08-Dec-1950

## 2017-04-20 ENCOUNTER — Ambulatory Visit: Payer: Medicare Other | Attending: Orthopaedic Surgery | Admitting: Physical Therapy

## 2017-04-20 DIAGNOSIS — R262 Difficulty in walking, not elsewhere classified: Secondary | ICD-10-CM | POA: Insufficient documentation

## 2017-04-20 DIAGNOSIS — M25571 Pain in right ankle and joints of right foot: Secondary | ICD-10-CM | POA: Diagnosis not present

## 2017-04-20 DIAGNOSIS — M25671 Stiffness of right ankle, not elsewhere classified: Secondary | ICD-10-CM | POA: Diagnosis present

## 2017-04-21 NOTE — Therapy (Addendum)
[place], [place], [address] Phone: [phone]   Fax:  [phone]  Physical Therapy Treatment/Discharge Summary  Patient Details  Name: Phyllis Hernandez MRN: [id number] Date of Birth: 09/29/1950 Referring Provider: Leandrew Koyanagi, MD  Encounter Date: 04/20/2017      PT End of Session - 04/20/17 1005    Visit Number 4   Number of Visits 4   Date for PT Re-Evaluation 04/21/17   Authorization Type Medicaid- 3 visits 10/2-11/2 followed by ERO for extension request   PT Start Time 1001   PT Stop Time 1039   PT Time Calculation (min) 38 min      Past Medical History:  Diagnosis Date  . Allergy   . Anemia   . Anemia in CKD (chronic kidney disease)   . Depression   . Diabetes mellitus without complication ([place])   . Hyperlipidemia   . Hypertension     Past Surgical History:  Procedure Laterality Date  . AV FISTULA PLACEMENT Left 12/23/2016   Procedure: LEFT UPPER EXTREMITY ARTERIOVENOUS (AV) FISTULA CREATION;  Surgeon: Elam Dutch, MD;  Location: [facility];  Service: Vascular;  Laterality: Left;  . INSERTION OF DIALYSIS CATHETER Right 12/23/2016   Procedure: INSERTION OF DIALYSIS CATHETER;  Surgeon: Elam Dutch, MD;  Location: [facility] [facility];  Service: Vascular;  Laterality: Right;  . TUBAL LIGATION      There were no vitals filed for this visit.      Subjective Assessment - 04/20/17 1004    Subjective No ankle pain, getting around good. Feel worn out today. Back is hurting.    Currently in Pain? No/denies                         [facility] Adult PT Treatment/Exercise - 04/20/17 0001      Ankle Exercises: Aerobic   Stationary Bike nu step 5 min L5     Ankle Exercises: Standing   SLS 4 sec best then 1 minute with 1 finger support bilateral    Balance Beam tandem walking   Other Standing Ankle Exercises heel/toe raises     Ankle Exercises: Seated   Heel Raises Limitations  heel/toe raises   BAPS Level 3   Other Seated Ankle Exercises sit<>stand without UE use  right knee gave way on third rep and she fell into chair.      tandem balance 30 sec each             PT Short Term Goals - 04/13/17 1025      PT SHORT TERM GOAL #1   Title Pt will be able to ambulate household distances comfortably with rolling walker & tennis shoes   Baseline SPC and tennis shoes   Status Achieved     PT SHORT TERM GOAL #2   Title Pt will demo DF ROM to 10 deg for functional range necessary for gait pattern and sit<>stand from standard chair   Baseline 10   Status Achieved     PT SHORT TERM GOAL #3   Title Pt will be independent in HEP for continued improvement & strengthening outside of treatment times   Baseline exercises established thus far are going well   Status Achieved           PT Long Term Goals - 04/13/17 1026      PT LONG TERM GOAL #1   Title Pt will be able to return to  independently running errands in the community without limitation by ankle pain   Baseline is able to do independently and without limitation   Status Achieved     PT LONG TERM GOAL #2   Title FOTO to 64% limitation to indicate significant improvement in functional ability   Baseline 41% limitation   Status Achieved     PT LONG TERM GOAL #3   Title Pt will be able to independently complete household chores without limitation by ankle pain   Baseline not limited by ankle pain   Status Achieved     PT LONG TERM GOAL #4   Title Pt will be able to ambulate at least 200 ft with use of SPC with good balance control to reduce need for AD in functional ambulation   Baseline able to demo with good control   Status Achieved               Plan - 04/20/17 0755    Clinical Impression Statement Pt enters with SPC and c/o back pain. No ankle pain. Not wearing ASO. She is independent with HEP and agreeable to discharge. All LTGS met.    PT Next Visit Plan D/C   PT Home  Exercise Plan towel scrunches, heel/toe raises, gastroc stretch, resisted PF, tandem balance with RW, heel/toe raises standing, SLS   Consulted and Agree with Plan of Care Patient      Patient will benefit from skilled therapeutic intervention in order to improve the following deficits and impairments:  Decreased range of motion, Difficulty walking, Increased muscle spasms, Obesity, Decreased activity tolerance, Pain, Improper body mechanics, Impaired flexibility, Decreased balance, Decreased strength, Impaired sensation, Postural dysfunction, Increased edema  Visit Diagnosis: Pain in right ankle and joints of right foot  Stiffness of right ankle, not elsewhere classified  Difficulty in walking, not elsewhere classified     Problem List Patient Active Problem List   Diagnosis Date Noted  . Closed displaced fracture of lateral malleolus of right fibula 02/09/2017  . ESRD (end stage renal disease) ([place]) 12/27/2016  . Elevated troponin 12/27/2016  . Anemia of chronic disease 12/27/2016  . Thrombocytopenia ([place]) 12/27/2016  . Constipation 12/27/2016  . Hypertensive emergency 12/09/2016  . Acute on chronic renal failure ([place]) 12/09/2016  . Type 2 diabetes mellitus with complication, without long-term current use of insulin ([place]) 02/27/2007  . Hyperlipidemia 02/27/2007  . Essential hypertension 02/27/2007    Dorene Ar , PTA 04/21/2017, 7:59 AM  [facility] [address] [place], [place], [address] Phone: [phone]   Fax:  [phone]  Name: Phyllis Hernandez MRN: [id number] Date of Birth: 01-Jan-1951  PHYSICAL THERAPY DISCHARGE SUMMARY  Visits from Start of Care: 4  Current functional level related to goals / functional outcomes: See above   Remaining deficits: See above   Education / Equipment: Anatomy of condition, POC, HEP, exercise form/rationale  Plan: Patient agrees to discharge.  Patient goals  were met. Patient is being discharged due to meeting the stated rehab goals.  ?????     Melinda Gwinner C. Hightower PT, DPT 04/21/17 9:00 AM

## 2017-04-27 ENCOUNTER — Ambulatory Visit (INDEPENDENT_AMBULATORY_CARE_PROVIDER_SITE_OTHER): Payer: Medicare Other | Admitting: Vascular Surgery

## 2017-04-27 ENCOUNTER — Encounter: Payer: Self-pay | Admitting: Vascular Surgery

## 2017-04-27 ENCOUNTER — Ambulatory Visit (HOSPITAL_COMMUNITY)
Admission: RE | Admit: 2017-04-27 | Discharge: 2017-04-27 | Disposition: A | Discharge status: Home / Self Care | Payer: Medicare Other | Source: Ambulatory Visit | Attending: Vascular Surgery | Admitting: Vascular Surgery

## 2017-04-27 VITALS — BP 177/75 | HR 66 | Temp 97.6°F | Resp 20 | Ht 65.0 in | Wt 225.0 lb

## 2017-04-27 DIAGNOSIS — N186 End stage renal disease: Secondary | ICD-10-CM

## 2017-04-27 DIAGNOSIS — Z992 Dependence on renal dialysis: Secondary | ICD-10-CM | POA: Diagnosis not present

## 2017-04-27 DIAGNOSIS — Z48812 Encounter for surgical aftercare following surgery on the circulatory system: Secondary | ICD-10-CM | POA: Diagnosis not present

## 2017-04-27 NOTE — Progress Notes (Signed)
Patient is a 66 year old female who returns for follow-up today. She had a left brachiocephalic AV fistula created 12/23/2016. She is currently dialyzing through a catheter on the right side Monday Wednesday and Friday at [facility]. She does complain of some mild numbness and tingling in the tips of all 5 digits in the left hand. She does not have any weakness in the hand. She states that it is chronic and not worsening.  Review of systems: She denies fever or chills. She denies chest pain. She denies shortness of breath.  Physical exam:  Vitals:   04/27/17 0906  BP: (!) 177/75  Pulse: 66  Resp: 20  Temp: 97.6 F (36.4 C)  TempSrc: Oral  SpO2: 97%  Weight: 225 lb (102.1 kg)  Height: 5\' 5"  (1.651 m)    Extremities: Palpable thrill audible bruit left arm AV fistula. No palpable radial pulse. No ulcerations on the fingertips. He  Neck: Right-sided dialysis catheter no drainage  Data: Patient had a duplex ultrasound of her AV fistula today. This showed a widely patent left brachiocephalic AV fistula with diameter 6-7 mm. Depth is 1 cm. Of note she did have antegrade flow in the radial artery.  Assessment: Maturing AV fistula left arm. It may be slightly deep. She will have her technicians at the dialysis center and evaluate if they think they are safe to cannulate it and I am okay with that at this point. If they think the fistula is too deep she will return for Superficialization. She does have some mild symptoms of steal but these seem to be tolerable for her currently.  Plan: Patient will follow-up on as-needed basis if she needs Superficialization of her left arm AV fistula in the future.   Ruta Hinds, MD [facility] Office: [phone] Pager: [phone]

## 2017-06-01 ENCOUNTER — Ambulatory Visit (HOSPITAL_COMMUNITY)
Admission: EM | Admit: 2017-06-01 | Discharge: 2017-06-01 | Disposition: A | Discharge status: Home / Self Care | Payer: Medicare Other | Attending: Emergency Medicine | Admitting: Emergency Medicine

## 2017-06-01 ENCOUNTER — Other Ambulatory Visit: Payer: Self-pay

## 2017-06-01 ENCOUNTER — Encounter (HOSPITAL_COMMUNITY): Payer: Self-pay | Admitting: *Deleted

## 2017-06-01 DIAGNOSIS — Z760 Encounter for issue of repeat prescription: Secondary | ICD-10-CM | POA: Diagnosis not present

## 2017-06-01 DIAGNOSIS — R58 Hemorrhage, not elsewhere classified: Secondary | ICD-10-CM | POA: Diagnosis not present

## 2017-06-01 DIAGNOSIS — I10 Essential (primary) hypertension: Secondary | ICD-10-CM | POA: Diagnosis not present

## 2017-06-01 MED ORDER — INSULIN DETEMIR 100 UNIT/ML FLEXPEN: PEN_INJECTOR | SUBCUTANEOUS | 0 refills | Status: DC

## 2017-06-01 MED ORDER — INSULIN ASPART 100 UNIT/ML FLEXPEN: PEN_INJECTOR | SUBCUTANEOUS | 0 refills | Status: DC

## 2017-06-01 MED ORDER — METOPROLOL TARTRATE 50 MG PO TABS
50.0000 mg | ORAL_TABLET | Freq: Two times a day (BID) | ORAL | 0 refills | Status: DC
Start: 2017-06-01 — End: 2017-08-10
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
## 2017-06-01 NOTE — Discharge Instructions (Addendum)
Heat to the arm. Medicine refill. Must obtain a doctor as soon as possible.

## 2017-08-10 ENCOUNTER — Ambulatory Visit (HOSPITAL_COMMUNITY)
Admission: EM | Admit: 2017-08-10 | Discharge: 2017-08-10 | Disposition: A | Discharge status: Home / Self Care | Payer: Medicare Other | Attending: Family Medicine | Admitting: Family Medicine

## 2017-08-10 ENCOUNTER — Encounter (HOSPITAL_COMMUNITY): Payer: Self-pay | Admitting: Emergency Medicine

## 2017-08-10 DIAGNOSIS — I10 Essential (primary) hypertension: Secondary | ICD-10-CM

## 2017-08-10 DIAGNOSIS — Z760 Encounter for issue of repeat prescription: Secondary | ICD-10-CM

## 2017-08-10 DIAGNOSIS — E118 Type 2 diabetes mellitus with unspecified complications: Secondary | ICD-10-CM

## 2017-08-10 MED ORDER — METOPROLOL TARTRATE 50 MG PO TABS: 50.0000 mg | ORAL_TABLET | Freq: Two times a day (BID) | ORAL | 2 refills | Status: DC

## 2017-08-10 MED ORDER — INSULIN DETEMIR 100 UNIT/ML FLEXPEN: PEN_INJECTOR | SUBCUTANEOUS | 2 refills | Status: DC

## 2017-08-10 MED ORDER — INSULIN PEN NEEDLE 29G X 12MM MISC: 1 refills | Status: DC

## 2017-08-10 NOTE — ED Triage Notes (Signed)
PT is here for a refill of her metoprolol and levemir.   PT does not have a PCP, these have been prescribed by an urgent care

## 2017-08-10 NOTE — ED Provider Notes (Signed)
[facility] [place]   [id number] 08/10/17 Arrival Time: 1006  ASSESSMENT & PLAN:  1. Medication refill   2. Type 2 diabetes mellitus with complication, without long-term current use of insulin ([place])   3. Essential hypertension     Meds ordered this encounter  Medications  . Insulin Detemir (LEVEMIR FLEXTOUCH) 100 UNIT/ML Pen    Sig: Inject 10 units under the skin at bedtime around 10 PM. This is the flex touch.    Dispense:  30 mL    Refill:  2  . Insulin Pen Needle 29G X 12MM MISC    Sig: To be used per instructions 3 times a day with meals and at bedtime.    Dispense:  200 each    Refill:  1  . metoprolol tartrate (LOPRESSOR) 50 MG tablet    Sig: Take 1 tablet (50 mg total) by mouth 2 (two) times daily.    Dispense:  60 tablet    Refill:  2   She may f/u with PCP as needed.  Reviewed expectations re: course of current medical issues. Questions answered. Outlined signs and symptoms indicating need for more acute intervention. Patient verbalized understanding. After Visit Summary given.   SUBJECTIVE: History from: patient. Phyllis Hernandez is a 67 y.o. female who presents requesting medication refill. No current concerns.  Current medical problems include:  Past Medical History:  Diagnosis Date  . Allergy   . Anemia   . Anemia in CKD (chronic kidney disease)   . Depression   . Diabetes mellitus without complication ([place])   . Hyperlipidemia   . Hypertension     Current Outpatient Medications:  .  aspirin 81 MG chewable tablet, Chew 81 mg by mouth daily., Disp: , Rfl:  .  bisacodyl (DULCOLAX) 10 MG suppository, Place 1 suppository (10 mg total) rectally daily as needed for moderate constipation., Disp: 12 suppository, Rfl: 0 .  calcium acetate (PHOSLO) 667 MG capsule, Take 2 capsules (1,334 mg total) by mouth 3 (three) times daily with meals., Disp: 90 capsule, Rfl: 0 .  ferrous sulfate 325 (65 FE) MG tablet, Take 325 mg by mouth daily with breakfast., Disp:  , Rfl:  .  insulin aspart (NOVOLOG) 100 UNIT/ML FlexPen, Use as directed 3 times a day with meals and at bedtime snack, Disp: 30 mL, Rfl: 0 .  Insulin Detemir (LEVEMIR FLEXTOUCH) 100 UNIT/ML Pen, Inject 10 units under the skin at bedtime around 10 PM. This is the flex touch., Disp: 30 mL, Rfl: 2 .  Insulin Glargine (LANTUS) 100 UNIT/ML Solostar Pen, Inject 25 Units into the skin daily at 10 pm., Disp: 15 mL, Rfl: 0 .  Insulin Pen Needle 29G X 12MM MISC, To be used per instructions 3 times a day with meals and at bedtime., Disp: 200 each, Rfl: 1 .  isosorbide-hydrALAZINE (BIDIL) 20-37.5 MG tablet, Take 2 tablets by mouth 3 (three) times daily., Disp: 90 tablet, Rfl: 0 .  metoprolol tartrate (LOPRESSOR) 50 MG tablet, Take 1 tablet (50 mg total) by mouth 2 (two) times daily., Disp: 60 tablet, Rfl: 2 .  multivitamin (RENA-VIT) TABS tablet, Take 1 tablet by mouth at bedtime., Disp: 30 tablet, Rfl: 0 .  oxyCODONE-acetaminophen (PERCOCET/ROXICET) 5-325 MG tablet, Take 2 tablets by mouth every 4 (four) hours as needed for severe pain., Disp: 15 tablet, Rfl: 0 .  polyethylene glycol (MIRALAX / GLYCOLAX) packet, Take 17 g by mouth 2 (two) times daily., Disp: 30 each, Rfl: 0 .  pravastatin (PRAVACHOL) 40 MG  tablet, Take 40 mg by mouth daily., Disp: , Rfl:  .  senna-docusate (SENOKOT-S) 8.6-50 MG tablet, Take 1 tablet by mouth 2 (two) times daily., Disp: 30 tablet, Rfl: 0  ROS: As per HPI.   OBJECTIVE:  Vitals:   08/10/17 1020 08/10/17 1021  BP: (!) 197/84   Pulse: (!) 104   Resp: 16   Temp: 98.8 F (37.1 C)   TempSrc: Oral   SpO2: 97%   Weight:  220 lb (99.8 kg)    General appearance: alert; no distress Lungs: clear to auscultation bilaterally Heart: regular rate and rhythm Extremities: no edema; symmetrical with no gross deformities Skin: warm and dry Neurologic: normal gait; normal symmetric reflexes Psychological: alert and cooperative; normal mood and affect   Allergies  Allergen  Reactions  . No Known Allergies     Past Medical History:  Diagnosis Date  . Allergy   . Anemia   . Anemia in CKD (chronic kidney disease)   . Depression   . Diabetes mellitus without complication ([place])   . Hyperlipidemia   . Hypertension    Social History   Socioeconomic History  . Marital status: Legally Separated    Spouse name: Not on file  . Number of children: Not on file  . Years of education: Not on file  . Highest education level: Not on file  Social Needs  . Financial resource strain: Not on file  . Food insecurity - worry: Not on file  . Food insecurity - inability: Not on file  . Transportation needs - medical: Not on file  . Transportation needs - non-medical: Not on file  Occupational History  . Not on file  Tobacco Use  . Smoking status: Never Smoker  . Smokeless tobacco: Never Used  Substance and Sexual Activity  . Alcohol use: No    Frequency: Never  . Drug use: No  . Sexual activity: Not on file  Other Topics Concern  . Not on file  Social History Narrative  . Not on file   Family History  Problem Relation Age of Onset  . Hypertension Mother   . Diabetes Mother   . Hypertension Maternal Grandmother   . Diabetes Maternal Grandmother    Past Surgical History:  Procedure Laterality Date  . AV FISTULA PLACEMENT Left 12/23/2016   Procedure: LEFT UPPER EXTREMITY ARTERIOVENOUS (AV) FISTULA CREATION;  Surgeon: Elam Dutch, MD;  Location: [facility];  Service: Vascular;  Laterality: Left;  . INSERTION OF DIALYSIS CATHETER Right 12/23/2016   Procedure: INSERTION OF DIALYSIS CATHETER;  Surgeon: Elam Dutch, MD;  Location: [facility];  Service: Vascular;  Laterality: Right;  . TUBAL LIGATION       Vanessa Kick, MD 08/10/17 1124

## 2017-08-15 ENCOUNTER — Ambulatory Visit: Payer: Medicare Other | Admitting: Vascular Surgery

## 2017-08-24 ENCOUNTER — Ambulatory Visit: Payer: Medicare Other | Admitting: Vascular Surgery

## 2017-09-04 ENCOUNTER — Ambulatory Visit (HOSPITAL_COMMUNITY)
Admission: EM | Admit: 2017-09-04 | Discharge: 2017-09-04 | Disposition: A | Discharge status: Home / Self Care | Payer: Medicare Other

## 2017-09-04 ENCOUNTER — Encounter (HOSPITAL_COMMUNITY): Payer: Self-pay | Admitting: Emergency Medicine

## 2017-09-04 DIAGNOSIS — E1165 Type 2 diabetes mellitus with hyperglycemia: Secondary | ICD-10-CM

## 2017-09-04 DIAGNOSIS — R52 Pain, unspecified: Secondary | ICD-10-CM

## 2017-09-04 DIAGNOSIS — Z794 Long term (current) use of insulin: Secondary | ICD-10-CM

## 2017-09-04 DIAGNOSIS — R059 Cough, unspecified: Secondary | ICD-10-CM

## 2017-09-04 DIAGNOSIS — R51 Headache: Secondary | ICD-10-CM

## 2017-09-04 DIAGNOSIS — I10 Essential (primary) hypertension: Secondary | ICD-10-CM

## 2017-09-04 DIAGNOSIS — J111 Influenza due to unidentified influenza virus with other respiratory manifestations: Secondary | ICD-10-CM

## 2017-09-04 DIAGNOSIS — R519 Headache, unspecified: Secondary | ICD-10-CM

## 2017-09-04 DIAGNOSIS — R69 Illness, unspecified: Secondary | ICD-10-CM

## 2017-09-04 DIAGNOSIS — IMO0002 Reserved for concepts with insufficient information to code with codable children: Secondary | ICD-10-CM

## 2017-09-04 DIAGNOSIS — N186 End stage renal disease: Secondary | ICD-10-CM

## 2017-09-04 DIAGNOSIS — E1122 Type 2 diabetes mellitus with diabetic chronic kidney disease: Secondary | ICD-10-CM

## 2017-09-04 DIAGNOSIS — R0602 Shortness of breath: Secondary | ICD-10-CM

## 2017-09-04 DIAGNOSIS — R05 Cough: Secondary | ICD-10-CM

## 2017-09-04 DIAGNOSIS — Z992 Dependence on renal dialysis: Secondary | ICD-10-CM

## 2017-09-04 NOTE — ED Triage Notes (Signed)
Pt sts URI sx x 3 days and needs refill on htn meds

## 2017-09-04 NOTE — ED Provider Notes (Signed)
MRN: [id number] DOB: 02/07/1951  Subjective:   Phyllis Hernandez is a 67 y.o. female presenting for 3 day history of productive cough that elicits shob, sore throat, sinus pain, nausea without vomiting, mild headache, body aches. Has not tried medications for relief. Denies fever, chest pain, abdominal pain. Denies smoking cigarettes. Patient is on dialysis for ESRD in setting of Type 2 DM managed with insulin. Reports that she does not have a PCP.   No current facility-administered medications for this encounter.   Current Outpatient Medications:  .  aspirin 81 MG chewable tablet, Chew 81 mg by mouth daily., Disp: , Rfl:  .  bisacodyl (DULCOLAX) 10 MG suppository, Place 1 suppository (10 mg total) rectally daily as needed for moderate constipation., Disp: 12 suppository, Rfl: 0 .  calcium acetate (PHOSLO) 667 MG capsule, Take 2 capsules (1,334 mg total) by mouth 3 (three) times daily with meals., Disp: 90 capsule, Rfl: 0 .  ferrous sulfate 325 (65 FE) MG tablet, Take 325 mg by mouth daily with breakfast., Disp: , Rfl:  .  insulin aspart (NOVOLOG) 100 UNIT/ML FlexPen, Use as directed 3 times a day with meals and at bedtime snack, Disp: 30 mL, Rfl: 0 .  Insulin Detemir (LEVEMIR FLEXTOUCH) 100 UNIT/ML Pen, Inject 10 units under the skin at bedtime around 10 PM. This is the flex touch., Disp: 30 mL, Rfl: 2 .  Insulin Glargine (LANTUS) 100 UNIT/ML Solostar Pen, Inject 25 Units into the skin daily at 10 pm., Disp: 15 mL, Rfl: 0 .  Insulin Pen Needle 29G X 12MM MISC, To be used per instructions 3 times a day with meals and at bedtime., Disp: 200 each, Rfl: 1 .  isosorbide-hydrALAZINE (BIDIL) 20-37.5 MG tablet, Take 2 tablets by mouth 3 (three) times daily., Disp: 90 tablet, Rfl: 0 .  metoprolol tartrate (LOPRESSOR) 50 MG tablet, Take 1 tablet (50 mg total) by mouth 2 (two) times daily., Disp: 60 tablet, Rfl: 2 .  multivitamin (RENA-VIT) TABS tablet, Take 1 tablet by mouth at bedtime., Disp: 30 tablet,  Rfl: 0 .  oxyCODONE-acetaminophen (PERCOCET/ROXICET) 5-325 MG tablet, Take 2 tablets by mouth every 4 (four) hours as needed for severe pain., Disp: 15 tablet, Rfl: 0 .  polyethylene glycol (MIRALAX / GLYCOLAX) packet, Take 17 g by mouth 2 (two) times daily., Disp: 30 each, Rfl: 0 .  pravastatin (PRAVACHOL) 40 MG tablet, Take 40 mg by mouth daily., Disp: , Rfl:  .  senna-docusate (SENOKOT-S) 8.6-50 MG tablet, Take 1 tablet by mouth 2 (two) times daily., Disp: 30 tablet, Rfl: 0   Phyllis Hernandez is allergic to no known allergies.  Phyllis Hernandez  has a past medical history of Allergy, Anemia, Anemia in CKD (chronic kidney disease), Depression, Diabetes mellitus without complication ([place]), Hyperlipidemia, and Hypertension. Also  has a past surgical history that includes Tubal ligation; AV fistula placement (Left, 12/23/2016); and Insertion of dialysis catheter (Right, 12/23/2016).  Objective:   Vitals: BP (!) 209/74 (BP Location: Left Arm)   Pulse 84   Temp 99 F (37.2 C) (Oral)   Resp 18   SpO2 100%   BP Readings from Last 3 Encounters:  09/04/17 (!) 209/74  08/10/17 (!) 197/84  06/01/17 (!) 200/87    Physical Exam  Constitutional: She is oriented to person, place, and time. She appears well-developed and well-nourished.  HENT:  Mouth/Throat: Oropharynx is clear and moist.  Eyes: Right eye exhibits no discharge. Left eye exhibits no discharge.  Neck: Normal range of motion. Neck supple. No  JVD present.  Cardiovascular: Normal rate, regular rhythm and intact distal pulses. Exam reveals no gallop and no friction rub.  No murmur heard. Pulmonary/Chest: No respiratory distress. She has no wheezes. She has no rales.  Neurological: She is alert and oriented to person, place, and time.  Skin: Skin is warm and dry.  Psychiatric: She has a normal mood and affect.   Assessment and Plan :   Influenza-like illness  Cough  Shortness of breath  Body aches  Generalized headaches  ESRD on dialysis  ([place])  Uncontrolled type 2 diabetes mellitus with chronic kidney disease on chronic dialysis, with long-term current use of insulin ([place])  Uncontrolled hypertension  Patient referred to ER for influenza like illness in setting of uncontrolled HTN, Type 2 DM with ESRD on insulin. Counseled patient that she is very high risk for complications and she agreed to report to the ER for evaluation.   Jaynee Eagles, [place] 09/04/17 7209

## 2017-09-04 NOTE — Discharge Instructions (Signed)
Patient referred to ER for evaluation of influenza with high risk for complication given uncontrolled HTN and ESRD.

## 2017-09-17 ENCOUNTER — Emergency Department (HOSPITAL_BASED_OUTPATIENT_CLINIC_OR_DEPARTMENT_OTHER): Payer: Medicare Other

## 2017-09-17 ENCOUNTER — Encounter (HOSPITAL_BASED_OUTPATIENT_CLINIC_OR_DEPARTMENT_OTHER): Payer: Self-pay | Admitting: Emergency Medicine

## 2017-09-17 ENCOUNTER — Emergency Department (HOSPITAL_BASED_OUTPATIENT_CLINIC_OR_DEPARTMENT_OTHER)
Admission: EM | Admit: 2017-09-17 | Discharge: 2017-09-17 | Disposition: A | Discharge status: Home / Self Care | Payer: Medicare Other | Attending: Emergency Medicine | Admitting: Emergency Medicine

## 2017-09-17 ENCOUNTER — Other Ambulatory Visit: Payer: Self-pay

## 2017-09-17 DIAGNOSIS — R2240 Localized swelling, mass and lump, unspecified lower limb: Secondary | ICD-10-CM | POA: Insufficient documentation

## 2017-09-17 DIAGNOSIS — Z794 Long term (current) use of insulin: Secondary | ICD-10-CM | POA: Insufficient documentation

## 2017-09-17 DIAGNOSIS — Z7982 Long term (current) use of aspirin: Secondary | ICD-10-CM | POA: Diagnosis not present

## 2017-09-17 DIAGNOSIS — R05 Cough: Secondary | ICD-10-CM | POA: Diagnosis not present

## 2017-09-17 DIAGNOSIS — E8770 Fluid overload, unspecified: Secondary | ICD-10-CM | POA: Insufficient documentation

## 2017-09-17 DIAGNOSIS — Z79899 Other long term (current) drug therapy: Secondary | ICD-10-CM | POA: Insufficient documentation

## 2017-09-17 DIAGNOSIS — I120 Hypertensive chronic kidney disease with stage 5 chronic kidney disease or end stage renal disease: Secondary | ICD-10-CM | POA: Insufficient documentation

## 2017-09-17 DIAGNOSIS — E1122 Type 2 diabetes mellitus with diabetic chronic kidney disease: Secondary | ICD-10-CM | POA: Insufficient documentation

## 2017-09-17 DIAGNOSIS — N186 End stage renal disease: Secondary | ICD-10-CM | POA: Diagnosis not present

## 2017-09-17 DIAGNOSIS — J3489 Other specified disorders of nose and nasal sinuses: Secondary | ICD-10-CM | POA: Insufficient documentation

## 2017-09-17 DIAGNOSIS — R0602 Shortness of breath: Secondary | ICD-10-CM | POA: Insufficient documentation

## 2017-09-17 DIAGNOSIS — Z7902 Long term (current) use of antithrombotics/antiplatelets: Secondary | ICD-10-CM | POA: Insufficient documentation

## 2017-09-17 DIAGNOSIS — Z992 Dependence on renal dialysis: Secondary | ICD-10-CM | POA: Diagnosis not present

## 2017-09-17 LAB — CBC WITH DIFFERENTIAL/PLATELET
Basophils Absolute: 0 10*3/uL (ref 0.0–0.1)
Basophils Relative: 0 %
EOS ABS: 0.2 10*3/uL (ref 0.0–0.7)
Eosinophils Relative: 2 %
HCT: 32.6 % — ABNORMAL LOW (ref 36.0–46.0)
Hemoglobin: 10.8 g/dL — ABNORMAL LOW (ref 12.0–15.0)
LYMPHS PCT: 14 %
Lymphs Abs: 1.7 10*3/uL (ref 0.7–4.0)
MCH: 24.4 pg — ABNORMAL LOW (ref 26.0–34.0)
MCHC: 33.1 g/dL (ref 30.0–36.0)
MCV: 73.6 fL — ABNORMAL LOW (ref 78.0–100.0)
MONO ABS: 1.2 10*3/uL — AB (ref 0.1–1.0)
Monocytes Relative: 10 %
NEUTROS PCT: 74 %
Neutro Abs: 9.2 10*3/uL — ABNORMAL HIGH (ref 1.7–7.7)
PLATELETS: 213 10*3/uL (ref 150–400)
RBC: 4.43 MIL/uL (ref 3.87–5.11)
RDW: 18.3 % — ABNORMAL HIGH (ref 11.5–15.5)
WBC: 12.3 10*3/uL — ABNORMAL HIGH (ref 4.0–10.5)

## 2017-09-17 LAB — COMPREHENSIVE METABOLIC PANEL
ALT: 25 U/L (ref 14–54)
AST: 20 U/L (ref 15–41)
Albumin: 3.4 g/dL — ABNORMAL LOW (ref 3.5–5.0)
Alkaline Phosphatase: 85 U/L (ref 38–126)
Anion gap: 14 (ref 5–15)
BUN: 91 mg/dL — AB (ref 6–20)
CHLORIDE: 108 mmol/L (ref 101–111)
CO2: 14 mmol/L — AB (ref 22–32)
CREATININE: 8.63 mg/dL — AB (ref 0.44–1.00)
Calcium: 8.9 mg/dL (ref 8.9–10.3)
GFR calc Af Amer: 5 mL/min — ABNORMAL LOW (ref >60)
GFR calc non Af Amer: 4 mL/min — ABNORMAL LOW (ref >60)
Glucose, Bld: 136 mg/dL — ABNORMAL HIGH (ref 65–99)
Potassium: 5.2 mmol/L — ABNORMAL HIGH (ref 3.5–5.1)
SODIUM: 136 mmol/L (ref 135–145)
Total Bilirubin: 0.5 mg/dL (ref 0.3–1.2)
Total Protein: 8.3 g/dL — ABNORMAL HIGH (ref 6.5–8.1)

## 2017-09-17 LAB — URINALYSIS, ROUTINE W REFLEX MICROSCOPIC
Bilirubin Urine: NEGATIVE
Glucose, UA: NEGATIVE mg/dL
Ketones, ur: NEGATIVE mg/dL
Nitrite: NEGATIVE
Protein, ur: >300 mg/dL — AB
Specific Gravity, Urine: 1.02 (ref 1.005–1.030)
pH: 6 (ref 5.0–8.0)

## 2017-09-17 LAB — URINALYSIS, MICROSCOPIC (REFLEX)

## 2017-09-17 LAB — TROPONIN I: Troponin I: 0.05 ng/mL (ref <0.03)

## 2017-09-17 MED ORDER — HYDRALAZINE HCL 25 MG PO TABS
25.0000 mg | ORAL_TABLET | Freq: Once | ORAL | Status: AC
  Administered 2017-09-17: 25 mg via ORAL
  Filled 2017-09-17: qty 1

## 2017-09-17 MED ORDER — METOPROLOL TARTRATE 50 MG PO TABS
50.0000 mg | ORAL_TABLET | Freq: Once | ORAL | Status: AC
  Administered 2017-09-17: 50 mg via ORAL
  Filled 2017-09-17: qty 1

## 2017-09-17 MED ORDER — ISOSORB DINITRATE-HYDRALAZINE 20-37.5 MG PO TABS
1.0000 | ORAL_TABLET | Freq: Once | ORAL | Status: DC
  Filled 2017-09-17: qty 1

## 2017-09-17 NOTE — ED Triage Notes (Signed)
Cough, congestion, body aches x 2 weeks.

## 2017-09-17 NOTE — Discharge Instructions (Signed)
Your workup today revealed evidence of fluid overload as you not had dialysis in 2 weeks.  Our shared decision making conversation focused on if you need to do dialysis or admission tonight however after he ambulated and walked without shortness of breath or vital sign worsening, we felt your request for discharge home was reasonable.  Her blood pressure was elevated but it slightly improved with the blood pressure medications.  Please take them as you have been.  Please go to your dialysis tomorrow to get fluid off.  We did not find evidence of acute bacterial infection tonight however you may have a viral infection contributing to your symptoms.  Please follow-up with your primary doctor as directed otherwise and if any symptoms change overnight before you get dialysis, please return immediately to the nearest emergency department.

## 2017-09-17 NOTE — ED Notes (Signed)
Patient ambulated in hallway on r/a, SpO2 97-100%, denies DOE or SOB, HR 92-97. RR 18-22.

## 2017-09-17 NOTE — ED Notes (Signed)
Pt requested inhaler for cough. States she has not had one before but wanted one for when she gets SOB with coughing. Dr. Sherry Ruffing made aware of pt's request

## 2017-09-17 NOTE — ED Notes (Signed)
ED Provider at bedside. 

## 2017-09-17 NOTE — ED Notes (Addendum)
Troponin 0.05, results given to ED MD and RN

## 2017-09-17 NOTE — ED Provider Notes (Signed)
[facility] EMERGENCY DEPARTMENT Provider Note   CSN: [id number] Arrival date & time: 09/17/17  1050     History   Chief Complaint Chief Complaint  Patient presents with  . Cough    HPI Phyllis Hernandez is a 67 y.o. female.  The history is provided by the patient and medical records. No language interpreter was used.  Shortness of Breath  This is a new problem. The average episode lasts 2 weeks. The problem occurs continuously.The current episode started more than 1 week ago. The problem has not changed since onset.Associated symptoms include rhinorrhea, cough and leg swelling (mild). Pertinent negatives include no fever, no headaches, no neck pain, no sputum production, no hemoptysis, no wheezing, no chest pain, no syncope, no vomiting, no abdominal pain and no leg pain. She has tried nothing for the symptoms. The treatment provided no relief.    Past Medical History:  Diagnosis Date  . Allergy   . Anemia   . Anemia in CKD (chronic kidney disease)   . Depression   . Diabetes mellitus without complication ([place])   . Hyperlipidemia   . Hypertension     Patient Active Problem List   Diagnosis Date Noted  . Closed displaced fracture of lateral malleolus of right fibula 02/09/2017  . ESRD (end stage renal disease) ([place]) 12/27/2016  . Elevated troponin 12/27/2016  . Anemia of chronic disease 12/27/2016  . Thrombocytopenia ([place]) 12/27/2016  . Constipation 12/27/2016  . Hypertensive emergency 12/09/2016  . Acute on chronic renal failure ([place]) 12/09/2016  . Type 2 diabetes mellitus with complication, without long-term current use of insulin ([place]) 02/27/2007  . Hyperlipidemia 02/27/2007  . Essential hypertension 02/27/2007    Past Surgical History:  Procedure Laterality Date  . AV FISTULA PLACEMENT Left 12/23/2016   Procedure: LEFT UPPER EXTREMITY ARTERIOVENOUS (AV) FISTULA CREATION;  Surgeon: Elam Dutch, MD;  Location: [facility];  Service: Vascular;   Laterality: Left;  . INSERTION OF DIALYSIS CATHETER Right 12/23/2016   Procedure: INSERTION OF DIALYSIS CATHETER;  Surgeon: Elam Dutch, MD;  Location: [facility];  Service: Vascular;  Laterality: Right;  . TUBAL LIGATION       OB History   None      Home Medications    Prior to Admission medications   Medication Sig Start Date End Date Taking? Authorizing Provider  aspirin 81 MG chewable tablet Chew 81 mg by mouth daily.    [provider]  bisacodyl (DULCOLAX) 10 MG suppository Place 1 suppository (10 mg total) rectally daily as needed for moderate constipation. 12/28/16   Hongalgi, Lenis Dickinson, MD  calcium acetate (PHOSLO) 667 MG capsule Take 2 capsules (1,334 mg total) by mouth 3 (three) times daily with meals. 12/28/16   Hongalgi, Lenis Dickinson, MD  ferrous sulfate 325 (65 FE) MG tablet Take 325 mg by mouth daily with breakfast.    [provider]  insulin aspart (NOVOLOG) 100 UNIT/ML FlexPen Use as directed 3 times a day with meals and at bedtime snack 06/01/17   Janne Napoleon, NP  Insulin Detemir (LEVEMIR FLEXTOUCH) 100 UNIT/ML Pen Inject 10 units under the skin at bedtime around 10 PM. This is the flex touch. 08/10/17   Vanessa Kick, MD  Insulin Glargine (LANTUS) 100 UNIT/ML Solostar Pen Inject 25 Units into the skin daily at 10 pm. 12/28/16   Hongalgi, Lenis Dickinson, MD  Insulin Pen Needle 29G X 12MM MISC To be used per instructions 3 times a day with meals and at  bedtime. 08/10/17   Vanessa Kick, MD  isosorbide-hydrALAZINE (BIDIL) 20-37.5 MG tablet Take 2 tablets by mouth 3 (three) times daily. 12/28/16   Hongalgi, Lenis Dickinson, MD  metoprolol tartrate (LOPRESSOR) 50 MG tablet Take 1 tablet (50 mg total) by mouth 2 (two) times daily. 08/10/17   Vanessa Kick, MD  multivitamin (RENA-VIT) TABS tablet Take 1 tablet by mouth at bedtime. 12/28/16   Hongalgi, Lenis Dickinson, MD  oxyCODONE-acetaminophen (PERCOCET/ROXICET) 5-325 MG tablet Take 2 tablets by mouth every 4 (four) hours as needed for severe  pain. 01/13/17   Recardo Evangelist, PA-C  polyethylene glycol [facility] / Floria Raveling) packet Take 17 g by mouth 2 (two) times daily. 12/28/16   Hongalgi, Lenis Dickinson, MD  pravastatin (PRAVACHOL) 40 MG tablet Take 40 mg by mouth daily.    [provider]  senna-docusate (SENOKOT-S) 8.6-50 MG tablet Take 1 tablet by mouth 2 (two) times daily. 12/28/16   Hongalgi, Lenis Dickinson, MD    Family History Family History  Problem Relation Age of Onset  . Hypertension Mother   . Diabetes Mother   . Hypertension Maternal Grandmother   . Diabetes Maternal Grandmother     Social History Social History   Tobacco Use  . Smoking status: Never Smoker  . Smokeless tobacco: Never Used  Substance Use Topics  . Alcohol use: No    Frequency: Never  . Drug use: No     Allergies   No known allergies   Review of Systems Review of Systems  Constitutional: Positive for fatigue. Negative for chills, diaphoresis and fever.  HENT: Positive for congestion and rhinorrhea.   Eyes: Negative for visual disturbance.  Respiratory: Positive for cough and shortness of breath. Negative for hemoptysis, sputum production, chest tightness, wheezing and stridor.   Cardiovascular: Positive for leg swelling (mild). Negative for chest pain, palpitations and syncope.  Gastrointestinal: Negative for abdominal pain, constipation, diarrhea, nausea and vomiting.  Genitourinary: Negative for difficulty urinating.  Musculoskeletal: Negative for back pain, neck pain and neck stiffness.  Skin: Negative for wound.  Neurological: Negative for light-headedness and headaches.  Psychiatric/Behavioral: Negative for agitation.  All other systems reviewed and are negative.    Physical Exam Updated Vital Signs BP (!) 233/82 (BP Location: Left Arm)   Pulse 86   Temp 97.6 F (36.4 C) (Oral)   Resp (!) 26   Ht 5\' 5"  (1.651 m)   Wt 99.8 kg (220 lb)   SpO2 98%   BMI 36.61 kg/m   Physical Exam  Constitutional: She is oriented to  person, place, and time. She appears well-developed and well-nourished. No distress.  HENT:  Head: Normocephalic.  Nose: Nose normal.  Mouth/Throat: Oropharynx is clear and moist. No oropharyngeal exudate.  Eyes: Pupils are equal, round, and reactive to light. Conjunctivae and EOM are normal.  Neck: Normal range of motion. No JVD present.  Cardiovascular: Normal rate and intact distal pulses.  No murmur heard. Pulmonary/Chest: Effort normal. No stridor. No respiratory distress. She has no wheezes. She has rales (faint). She exhibits no tenderness.  Abdominal: Soft. She exhibits no distension. There is no tenderness.  Musculoskeletal: Normal range of motion. She exhibits edema. She exhibits no tenderness.  Lymphadenopathy:    She has no cervical adenopathy.  Neurological: She is alert and oriented to person, place, and time. No sensory deficit. She exhibits normal muscle tone.  Skin: Capillary refill takes less than 2 seconds. No rash noted. She is not diaphoretic. No erythema.  Psychiatric: She has a  normal mood and affect.  Nursing note and vitals reviewed.    ED Treatments / Results  Labs (all labs ordered are listed, but only abnormal results are displayed) Labs Reviewed  CBC WITH DIFFERENTIAL/PLATELET - Abnormal; Notable for the following components:      Result Value   WBC 12.3 (*)    Hemoglobin 10.8 (*)    HCT 32.6 (*)    MCV 73.6 (*)    MCH 24.4 (*)    RDW 18.3 (*)    Neutro Abs 9.2 (*)    Monocytes Absolute 1.2 (*)    All other components within normal limits  COMPREHENSIVE METABOLIC PANEL - Abnormal; Notable for the following components:   Potassium 5.2 (*)    CO2 14 (*)    Glucose, Bld 136 (*)    BUN 91 (*)    Creatinine, Ser 8.63 (*)    Total Protein 8.3 (*)    Albumin 3.4 (*)    GFR calc non Af Amer 4 (*)    GFR calc Af Amer 5 (*)    All other components within normal limits  TROPONIN I - Abnormal; Notable for the following components:   Troponin I 0.05 (*)     All other components within normal limits  URINALYSIS, ROUTINE W REFLEX MICROSCOPIC - Abnormal; Notable for the following components:   APPearance CLOUDY (*)    Hgb urine dipstick SMALL (*)    Protein, ur >300 (*)    Leukocytes, UA TRACE (*)    All other components within normal limits  URINALYSIS, MICROSCOPIC (REFLEX) - Abnormal; Notable for the following components:   Bacteria, UA MANY (*)    Squamous Epithelial / LPF 6-30 (*)    All other components within normal limits  URINE CULTURE    EKG EKG Interpretation  Date/Time:  Sunday September 17 2017 12:37:54 EDT Ventricular Rate:  83 PR Interval:    QRS Duration: 85 QT Interval:  402 QTC Calculation: 473 R Axis:   -10 Text Interpretation:  Sinus or ectopic atrial rhythm Baseline wander in lead(s) I II III aVR aVL aVF V1 V2 V3 V4 V5 V6 When compared to prior, wandering baseline.  No STEMI Confirmed by Antony Blackbird [phone]) on 09/17/2017 12:42:21 PM   Radiology Dg Chest 2 View  Result Date: 09/17/2017 CLINICAL DATA:  Cough for 2 weeks. EXAM: CHEST - 2 VIEW COMPARISON:  12/23/2016 acute and prior radiographs FINDINGS: Cardiomegaly and RIGHT IJ central venous catheter with tip overlying the SUPERIOR cavoatrial junction again noted. Pulmonary vascular congestion and mild interstitial opacities are again noted. There is no evidence of focal airspace disease, suspicious pulmonary nodule/mass, pleural effusion, or pneumothorax. No acute bony abnormalities are identified. IMPRESSION: 1. Cardiomegaly with pulmonary vascular congestion and mild interstitial opacities which may represent mild interstitial edema. 2. No evidence of focal pneumonia. Electronically Signed   By: Margarette Canada M.D.   On: 09/17/2017 11:34    Procedures Procedures (including critical care time)  Medications Ordered in ED Medications  metoprolol tartrate (LOPRESSOR) tablet 50 mg (50 mg Oral Given 09/17/17 1523)  hydrALAZINE (APRESOLINE) tablet 25 mg (25 mg Oral Given  09/17/17 1523)     Initial Impression / Assessment and Plan / ED Course  I have reviewed the triage vital signs and the nursing notes.  Pertinent labs & imaging results that were available during my care of the patient were reviewed by me and considered in my medical decision making (see chart for details).  Phyllis Hernandez is a 67 y.o. female with a past medical history significant for ESRD on dialysis M/W/F, hypertension, hyperlipidemia, and diabetes who presents with shortness of breath.  Patient reports that she has had URI like symptoms for the last 2 weeks including cough and congestion.  She says that she did not go to dialysis because she was feeling bad for the last 2 weeks but is scheduled to go tomorrow.  She reports that she has had no fevers, chills, chest pain, palpitations, nausea, vomiting, conservation, diarrhea, or urinary symptoms.  She still makes some urine.  She denies any pain.  She reports that she has had some elevated blood pressure at home but is still able to take her medicine.  She reports that she wanted to be evaluated before going back to her dialysis tomorrow.  Next  On exam, patient had faint crackles in the bases of her lungs.  No rhonchi.  Abdomen and chest were nontender.  No murmurs are heard heart exam.  Legs had very mild edema but patient was able to ambulate without difficulty.  Patient was on room air and was not tachypneic or tachycardic on my evaluation.  EKG did not show evidence of STEMI.  Next  As patient is missed dialysis for 2 weeks, patient will workup to look for fluid overload or significant ab normalities and her blood work.  Laboratory testing showed a mild leukocytosis of 12.3 and mild anemia of 10.8.  CMP showed elevated creatinine of 8.6 with a BUN of 91.  It looks like patient has had creatinine elevation in the past but this appears to be worse.  Suspect this is due to the missed dialysis.  Potassium slightly low at 5.2 however no EKG  changes were seen.  Troponin was elevated at 0.05 however this is improved from prior.  Patient's continues to deny any chest pain.  Urinalysis shows no nitrites, doubt UTI with the absence of other symptoms and the squamous cell seen.  Chest x-ray showed some concern for mild edema but no evidence of pneumonia or other significant changes.  Patient was offered admission for dialysis given the fluid overload, elevated blood pressure, mild edema and crackles on her lungs however she would rather go home and go to dialysis tomorrow.  Patient was ambulated with respiratory therapy and she had no evidence of hypoxia or tachycardia or tachypnea during her ambulation.  Patient reports feeling great and wanting to go home.   patient also did not want to wait for further troponin.   Patient will go to dialysis tomorrow for fluid removal and further management.  Patient understood extremely strict return precautions after our shared decision making conversation where she decided to go home.  Do not feel strongly the patient needs to sign out AMA as she was essentially asymptomatic during her ambulation trial in the ED.  Patient's blood pressure improved after home blood pressure medicine was ordered.  Do not feel she is in hypertensive emergency or crisis at this time.  Next  Patient had no depressions or concerns and was discharged in good condition with improved symptoms.       Final Clinical Impressions(s) / ED Diagnoses   Final diagnoses:  Shortness of breath  Hypervolemia, unspecified hypervolemia type    ED Discharge Orders    None     Clinical Impression: 1. Shortness of breath   2. Hypervolemia, unspecified hypervolemia type     Disposition: Discharge  Condition: Good  I have discussed  the results, Dx and Tx plan with the pt(& family if present). He/she/they expressed understanding and agree(s) with the plan. Discharge instructions discussed at great length. Strict return precautions  discussed and pt &/or family have verbalized understanding of the instructions. No further questions at time of discharge.    Discharge Medication List as of 09/17/2017  3:46 PM      Follow Up: [facility] [address] [id number] [facility] [place] [phone]        Tegeler, Gwenyth Allegra, MD 09/17/17 1630

## 2017-09-18 LAB — URINE CULTURE

## 2017-09-21 ENCOUNTER — Ambulatory Visit: Payer: Medicare Other | Admitting: Vascular Surgery

## 2017-10-27 ENCOUNTER — Encounter (HOSPITAL_BASED_OUTPATIENT_CLINIC_OR_DEPARTMENT_OTHER): Payer: Self-pay | Admitting: Emergency Medicine

## 2017-10-27 ENCOUNTER — Emergency Department (HOSPITAL_BASED_OUTPATIENT_CLINIC_OR_DEPARTMENT_OTHER)
Admission: EM | Admit: 2017-10-27 | Discharge: 2017-10-27 | Disposition: A | Discharge status: Home / Self Care | Payer: Medicare Other | Attending: Physician Assistant | Admitting: Physician Assistant

## 2017-10-27 ENCOUNTER — Emergency Department (HOSPITAL_BASED_OUTPATIENT_CLINIC_OR_DEPARTMENT_OTHER): Payer: Medicare Other

## 2017-10-27 ENCOUNTER — Other Ambulatory Visit: Payer: Self-pay

## 2017-10-27 DIAGNOSIS — Z7982 Long term (current) use of aspirin: Secondary | ICD-10-CM | POA: Insufficient documentation

## 2017-10-27 DIAGNOSIS — Z992 Dependence on renal dialysis: Secondary | ICD-10-CM | POA: Diagnosis not present

## 2017-10-27 DIAGNOSIS — N186 End stage renal disease: Secondary | ICD-10-CM | POA: Diagnosis not present

## 2017-10-27 DIAGNOSIS — Z79899 Other long term (current) drug therapy: Secondary | ICD-10-CM | POA: Insufficient documentation

## 2017-10-27 DIAGNOSIS — J069 Acute upper respiratory infection, unspecified: Secondary | ICD-10-CM | POA: Diagnosis not present

## 2017-10-27 DIAGNOSIS — E1122 Type 2 diabetes mellitus with diabetic chronic kidney disease: Secondary | ICD-10-CM | POA: Diagnosis not present

## 2017-10-27 DIAGNOSIS — Z794 Long term (current) use of insulin: Secondary | ICD-10-CM | POA: Diagnosis not present

## 2017-10-27 DIAGNOSIS — R05 Cough: Secondary | ICD-10-CM | POA: Diagnosis present

## 2017-10-27 DIAGNOSIS — I120 Hypertensive chronic kidney disease with stage 5 chronic kidney disease or end stage renal disease: Secondary | ICD-10-CM | POA: Diagnosis not present

## 2017-10-27 DIAGNOSIS — F329 Major depressive disorder, single episode, unspecified: Secondary | ICD-10-CM | POA: Diagnosis not present

## 2017-10-27 LAB — BASIC METABOLIC PANEL
Anion gap: 16 — ABNORMAL HIGH (ref 5–15)
BUN: 63 mg/dL — AB (ref 6–20)
CHLORIDE: 99 mmol/L — AB (ref 101–111)
CO2: 19 mmol/L — AB (ref 22–32)
Calcium: 8.5 mg/dL — ABNORMAL LOW (ref 8.9–10.3)
Creatinine, Ser: 10.34 mg/dL — ABNORMAL HIGH (ref 0.44–1.00)
GFR calc Af Amer: 4 mL/min — ABNORMAL LOW (ref >60)
GFR calc non Af Amer: 3 mL/min — ABNORMAL LOW (ref >60)
Glucose, Bld: 173 mg/dL — ABNORMAL HIGH (ref 65–99)
POTASSIUM: 4.8 mmol/L (ref 3.5–5.1)
Sodium: 134 mmol/L — ABNORMAL LOW (ref 135–145)

## 2017-10-27 LAB — CBC WITH DIFFERENTIAL/PLATELET
Basophils Absolute: 0 10*3/uL (ref 0.0–0.1)
Basophils Relative: 0 %
EOS PCT: 0 %
Eosinophils Absolute: 0 10*3/uL (ref 0.0–0.7)
HEMATOCRIT: 32 % — AB (ref 36.0–46.0)
Hemoglobin: 10.7 g/dL — ABNORMAL LOW (ref 12.0–15.0)
LYMPHS ABS: 1.2 10*3/uL (ref 0.7–4.0)
LYMPHS PCT: 14 %
MCH: 24.6 pg — AB (ref 26.0–34.0)
MCHC: 33.4 g/dL (ref 30.0–36.0)
MCV: 73.6 fL — AB (ref 78.0–100.0)
MONO ABS: 1.8 10*3/uL — AB (ref 0.1–1.0)
MONOS PCT: 21 %
Neutro Abs: 5.5 10*3/uL (ref 1.7–7.7)
Neutrophils Relative %: 65 %
PLATELETS: 144 10*3/uL — AB (ref 150–400)
RBC: 4.35 MIL/uL (ref 3.87–5.11)
RDW: 18.7 % — AB (ref 11.5–15.5)
WBC: 8.5 10*3/uL (ref 4.0–10.5)

## 2017-10-27 LAB — RAPID STREP SCREEN (MED CTR MEBANE ONLY): STREPTOCOCCUS, GROUP A SCREEN (DIRECT): NEGATIVE

## 2017-10-27 MED ORDER — ACETAMINOPHEN 325 MG PO TABS
650.0000 mg | ORAL_TABLET | Freq: Once | ORAL | Status: AC
  Administered 2017-10-27: 650 mg via ORAL
  Filled 2017-10-27: qty 2

## 2017-10-27 MED ORDER — AZITHROMYCIN 250 MG PO TABS: 250.0000 mg | ORAL_TABLET | Freq: Every day | ORAL | 0 refills | Status: DC

## 2017-10-27 MED ORDER — BENZONATATE 100 MG PO CAPS
100.0000 mg | ORAL_CAPSULE | Freq: Once | ORAL | Status: AC
  Administered 2017-10-27: 100 mg via ORAL
  Filled 2017-10-27: qty 1

## 2017-10-27 MED ORDER — BENZONATATE 100 MG PO CAPS: 100.0000 mg | ORAL_CAPSULE | Freq: Three times a day (TID) | ORAL | 0 refills | Status: DC

## 2017-10-27 MED FILL — AZITHROMYCIN 250 MG TABLET: 250 | 5 days supply | Qty: 6 | Fill #0

## 2017-10-27 MED FILL — BENZONATATE 100 MG CAP: 100 | 7 days supply | Qty: 21 | Fill #0

## 2017-10-27 NOTE — Discharge Instructions (Addendum)
It was my pleasure taking care of you today!   Please take all of your antibiotics until finished!   Call your kidney doctor to schedule dialysis treatment.   Tessalon as needed for cough. Alternate between Tylenol and ibuprofen as needed for body aches / fevers.   Rest, drink plenty of fluids to be sure you are staying hydrated.   Return to ER for new or worsening symptoms, any additional concerns.

## 2017-10-27 NOTE — ED Provider Notes (Signed)
[facility] EMERGENCY DEPARTMENT Provider Note   CSN: [id number] Arrival date & time: 10/27/17  1101     History   Chief Complaint Chief Complaint  Patient presents with  . Cough    fall  . Sore Throat    HPI Phyllis Hernandez is a 67 y.o. female.  The history is provided by the patient and medical records. No language interpreter was used.  Cough  Associated symptoms include sore throat. Pertinent negatives include no chest pain and no shortness of breath.  Sore Throat  Pertinent negatives include no chest pain and no shortness of breath.   Phyllis Hernandez is a 67 y.o. female  with a PMH of ESRD on dialysis MWF, DM, HTN, HLD who presents to the Emergency Department complaining of productive cough which began 2 days ago.  Associated with sore throat.  She also notes having coughing fits that will lead to posttussive emesis.  She has not had any spontaneous vomiting.  Denies any abdominal pain or nausea.  Her last dialysis treatment was on Monday.  She states that she felt so sick today and Wednesday that she did not go.  She does not feel short of breath.  She denies any lower extremity swelling.  She is not having any chest pain.  She notes subjective fever at home.  Temperature of 100.4 here in the emergency department.  No medications taken prior to arrival for symptoms.  She does have a few friends sick with URI type symptoms.   Past Medical History:  Diagnosis Date  . Allergy   . Anemia   . Anemia in CKD (chronic kidney disease)   . Depression   . Diabetes mellitus without complication ([place])   . Hyperlipidemia   . Hypertension     Patient Active Problem List   Diagnosis Date Noted  . Closed displaced fracture of lateral malleolus of right fibula 02/09/2017  . ESRD (end stage renal disease) ([place]) 12/27/2016  . Elevated troponin 12/27/2016  . Anemia of chronic disease 12/27/2016  . Thrombocytopenia ([place]) 12/27/2016  . Constipation 12/27/2016  .  Hypertensive emergency 12/09/2016  . Acute on chronic renal failure ([place]) 12/09/2016  . Type 2 diabetes mellitus with complication, without long-term current use of insulin ([place]) 02/27/2007  . Hyperlipidemia 02/27/2007  . Essential hypertension 02/27/2007    Past Surgical History:  Procedure Laterality Date  . AV FISTULA PLACEMENT Left 12/23/2016   Procedure: LEFT UPPER EXTREMITY ARTERIOVENOUS (AV) FISTULA CREATION;  Surgeon: Elam Dutch, MD;  Location: [facility];  Service: Vascular;  Laterality: Left;  . INSERTION OF DIALYSIS CATHETER Right 12/23/2016   Procedure: INSERTION OF DIALYSIS CATHETER;  Surgeon: Elam Dutch, MD;  Location: [facility];  Service: Vascular;  Laterality: Right;  . TUBAL LIGATION       OB History   None      Home Medications    Prior to Admission medications   Medication Sig Start Date End Date Taking? Authorizing Provider  aspirin 81 MG chewable tablet Chew 81 mg by mouth daily.    [provider]  azithromycin (ZITHROMAX) 250 MG tablet Take 1 tablet (250 mg total) by mouth daily. Take first 2 tablets together, then 1 every day until finished. 10/27/17   Ward, Ozella Almond, PA-C  benzonatate (TESSALON) 100 MG capsule Take 1 capsule (100 mg total) by mouth every 8 (eight) hours. 10/27/17   Ward, Ozella Almond, PA-C  bisacodyl (DULCOLAX) 10 MG suppository Place 1 suppository (10 mg  total) rectally daily as needed for moderate constipation. 12/28/16   Hongalgi, Lenis Dickinson, MD  calcium acetate (PHOSLO) 667 MG capsule Take 2 capsules (1,334 mg total) by mouth 3 (three) times daily with meals. 12/28/16   Hongalgi, Lenis Dickinson, MD  ferrous sulfate 325 (65 FE) MG tablet Take 325 mg by mouth daily with breakfast.    [provider]  insulin aspart (NOVOLOG) 100 UNIT/ML FlexPen Use as directed 3 times a day with meals and at bedtime snack 06/01/17   Janne Napoleon, NP  Insulin Detemir (LEVEMIR FLEXTOUCH) 100 UNIT/ML Pen Inject 10 units under the skin at bedtime  around 10 PM. This is the flex touch. 08/10/17   Vanessa Kick, MD  Insulin Glargine (LANTUS) 100 UNIT/ML Solostar Pen Inject 25 Units into the skin daily at 10 pm. 12/28/16   Hongalgi, Lenis Dickinson, MD  Insulin Pen Needle 29G X 12MM MISC To be used per instructions 3 times a day with meals and at bedtime. 08/10/17   Vanessa Kick, MD  isosorbide-hydrALAZINE (BIDIL) 20-37.5 MG tablet Take 2 tablets by mouth 3 (three) times daily. 12/28/16   Hongalgi, Lenis Dickinson, MD  metoprolol tartrate (LOPRESSOR) 50 MG tablet Take 1 tablet (50 mg total) by mouth 2 (two) times daily. 08/10/17   Vanessa Kick, MD  multivitamin (RENA-VIT) TABS tablet Take 1 tablet by mouth at bedtime. 12/28/16   Hongalgi, Lenis Dickinson, MD  oxyCODONE-acetaminophen (PERCOCET/ROXICET) 5-325 MG tablet Take 2 tablets by mouth every 4 (four) hours as needed for severe pain. 01/13/17   Recardo Evangelist, PA-C  polyethylene glycol [facility] / Floria Raveling) packet Take 17 g by mouth 2 (two) times daily. 12/28/16   Hongalgi, Lenis Dickinson, MD  pravastatin (PRAVACHOL) 40 MG tablet Take 40 mg by mouth daily.    [provider]  senna-docusate (SENOKOT-S) 8.6-50 MG tablet Take 1 tablet by mouth 2 (two) times daily. 12/28/16   Hongalgi, Lenis Dickinson, MD    Family History Family History  Problem Relation Age of Onset  . Hypertension Mother   . Diabetes Mother   . Hypertension Maternal Grandmother   . Diabetes Maternal Grandmother     Social History Social History   Tobacco Use  . Smoking status: Never Smoker  . Smokeless tobacco: Never Used  Substance Use Topics  . Alcohol use: No    Frequency: Never  . Drug use: No     Allergies   No known allergies   Review of Systems Review of Systems  Constitutional: Positive for fever.  HENT: Positive for congestion and sore throat.   Respiratory: Positive for cough. Negative for shortness of breath.   Cardiovascular: Negative for chest pain.  All other systems reviewed and are negative.    Physical  Exam Updated Vital Signs BP (!) 157/66   Pulse 80   Temp (!) 100.4 F (38 C) (Oral)   Resp (!) 26   Ht 5\' 5"  (1.651 m)   Wt 99.8 kg (220 lb)   SpO2 92%   BMI 36.61 kg/m   Physical Exam  Constitutional: She is oriented to person, place, and time. She appears well-developed and well-nourished. No distress.  Nontoxic-appearing.  HENT:  Head: Normocephalic and atraumatic.  Cardiovascular: Normal rate, regular rhythm and normal heart sounds.  No murmur heard. Pulmonary/Chest: Effort normal. No respiratory distress.  Lungs clear to auscultation bilaterally.  Abdominal: Soft. She exhibits no distension. There is no tenderness.  Musculoskeletal:  Minimal lower extremity edema.  Neurological: She is alert and oriented to  person, place, and time.  Skin: Skin is warm and dry.  Nursing note and vitals reviewed.    ED Treatments / Results  Labs (all labs ordered are listed, but only abnormal results are displayed) Labs Reviewed  BASIC METABOLIC PANEL - Abnormal; Notable for the following components:      Result Value   Sodium 134 (*)    Chloride 99 (*)    CO2 19 (*)    Glucose, Bld 173 (*)    BUN 63 (*)    Creatinine, Ser 10.34 (*)    Calcium 8.5 (*)    GFR calc non Af Amer 3 (*)    GFR calc Af Amer 4 (*)    Anion gap 16 (*)    All other components within normal limits  CBC WITH DIFFERENTIAL/PLATELET - Abnormal; Notable for the following components:   Hemoglobin 10.7 (*)    HCT 32.0 (*)    MCV 73.6 (*)    MCH 24.6 (*)    RDW 18.7 (*)    Platelets 144 (*)    Monocytes Absolute 1.8 (*)    All other components within normal limits  RAPID STREP SCREEN ([facility] ONLY)  CULTURE, GROUP A STREP [facility])    EKG EKG Interpretation  Date/Time:  Friday Oct 27 2017 11:47:52 EDT Ventricular Rate:  85 PR Interval:    QRS Duration: 87 QT Interval:  403 QTC Calculation: 480 R Axis:   -13 Text Interpretation:  Sinus rhythm Left ventricular hypertrophy Normal sinus rhythm  Confirmed by Thomasene Lot, [place] [phone]) on 10/27/2017 12:12:28 PM   Radiology Dg Chest 2 View  Result Date: 10/27/2017 CLINICAL DATA:  Cough, weakness and fever. EXAM: CHEST - 2 VIEW COMPARISON:  09/17/2017 FINDINGS: Borderline cardiomegaly. Right internal jugular catheter tip at the SVC RA junction. Mild venous hypertension without edema. Improved pattern compared to the study of 09/17/2017. No effusions. No focal findings. Bony structures unremarkable. IMPRESSION: Borderline cardiomegaly. Mild venous hypertension without edema. Improved pattern compared to the study of 09/17/2017. Electronically Signed   By: Nelson Chimes M.D.   On: 10/27/2017 11:55    Procedures Procedures (including critical care time)  Medications Ordered in ED Medications  acetaminophen (TYLENOL) tablet 650 mg (650 mg Oral Given 10/27/17 1236)  benzonatate (TESSALON) capsule 100 mg (100 mg Oral Given 10/27/17 1236)     Initial Impression / Assessment and Plan / ED Course  I have reviewed the triage vital signs and the nursing notes.  Pertinent labs & imaging results that were available during my care of the patient were reviewed by me and considered in my medical decision making (see chart for details).    Phyllis Hernandez is a 67 y.o. female who presents to ED for cough, congestion and subjective fever at home.  Temperature of 100.4 in the emergency department today.  Given Tylenol.  History of end-stage renal disease on dialysis.  She has missed her dialysis treatment both Wednesday and today.  She does not appear volume overloaded on exam.  She has very minimal lower extremity edema and her lungs are clear to auscultation.  She is oxygenating well with no complaints of shortness of breath.  Chest x-ray with borderline cardiomegaly and mild venous hypertension, but no edema.  No evidence of pneumonia.  Rapid strep negative.  Labs reviewed.  She does need dialysis, but no indication for emergent dialysis.  Normal  potassium.  She will call nephrology today and try to get in tomorrow if possible.  Will treat with azithromycin. PCP follow up encouraged.  Reasons to return to emergency department discussed and all questions answered.  Patient seen by and discussed with Dr. Thomasene Lot who agrees with treatment plan.    Final Clinical Impressions(s) / ED Diagnoses   Final diagnoses:  Upper respiratory tract infection, unspecified type    ED Discharge Orders        Ordered    benzonatate (TESSALON) 100 MG capsule  Every 8 hours     10/27/17 1336    azithromycin (ZITHROMAX) 250 MG tablet  Daily     10/27/17 1336       Ward, Ozella Almond, PA-C 10/27/17 1452    Macarthur Critchley, MD 10/29/17 [phone]

## 2017-10-27 NOTE — ED Triage Notes (Addendum)
Productive cough since Wed and coughing makes her vomit. Also fell last night due to water in BR floor. No LOC, pain to legs, walked in with cane per usual . Sore throat  since Wed as well. Last HD treatment on Monday, missed Wed and today due to feeling sick

## 2017-10-29 LAB — CULTURE, GROUP A STREP (THRC)

## 2017-11-14 ENCOUNTER — Ambulatory Visit: Payer: Medicare Other | Admitting: Vascular Surgery

## 2017-11-18 IMAGING — DX DG TIBIA/FIBULA 2V*R*
4 series · 4 of 4 positions shown · non-contrast
Comparison: Right ankle radiographs from the same day.

CLINICAL DATA: Fall.  Right knee, calf, and ankle pain after fall.

EXAM:
RIGHT TIBIA AND FIBULA - 2 VIEW

[tibia ap (1 of 2)]
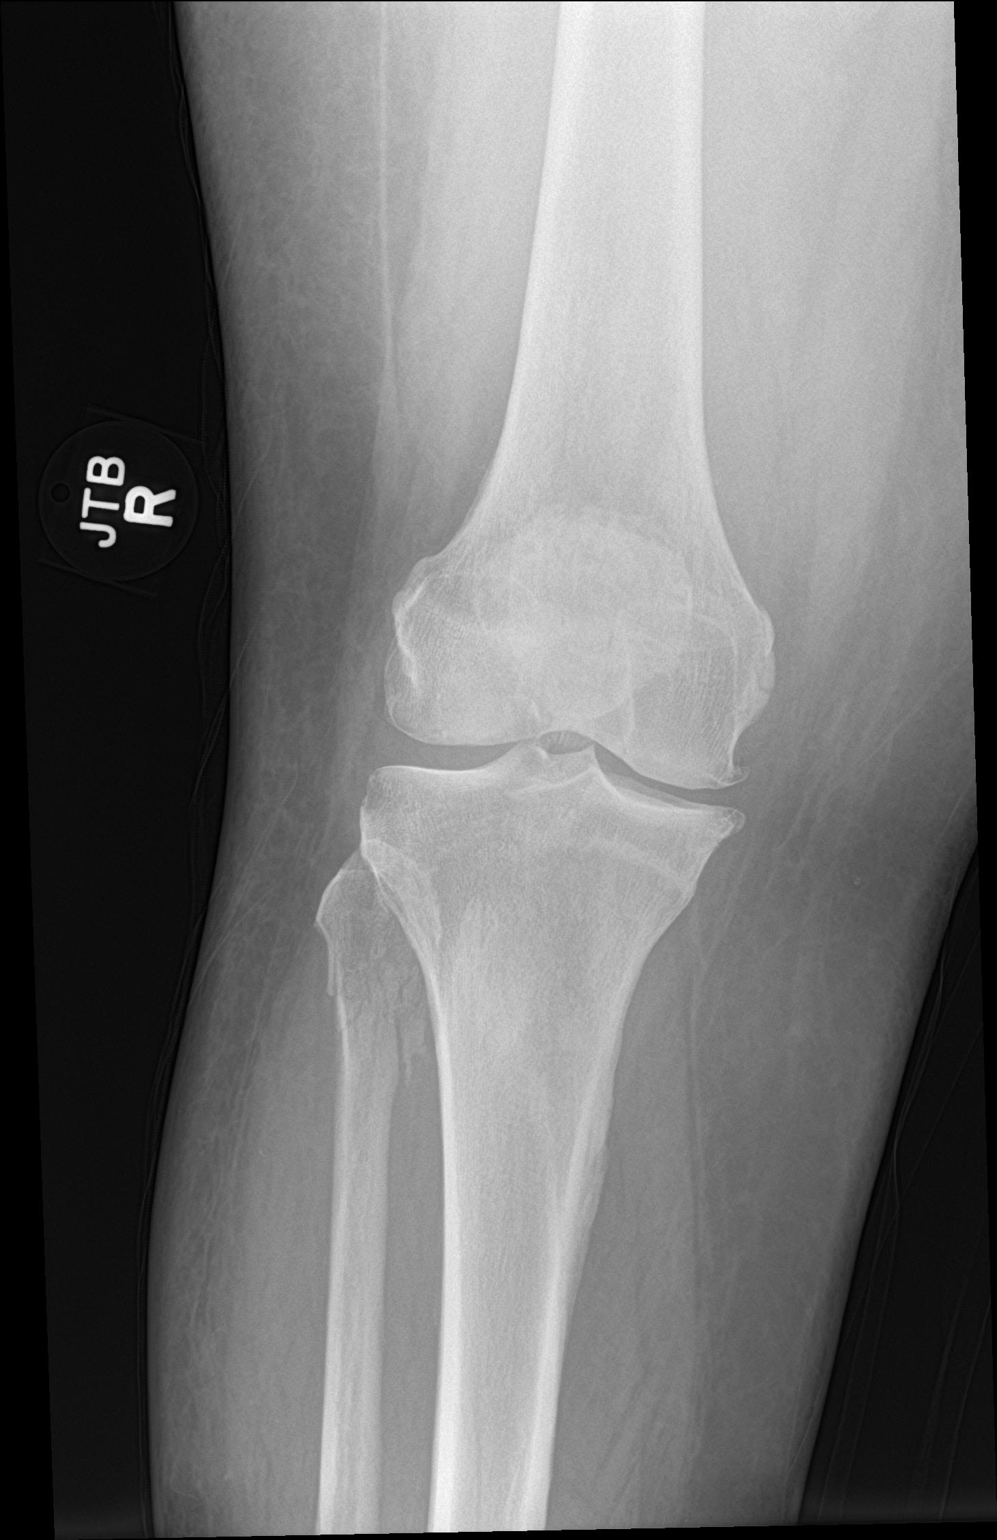

[tibia ap (2 of 2)]
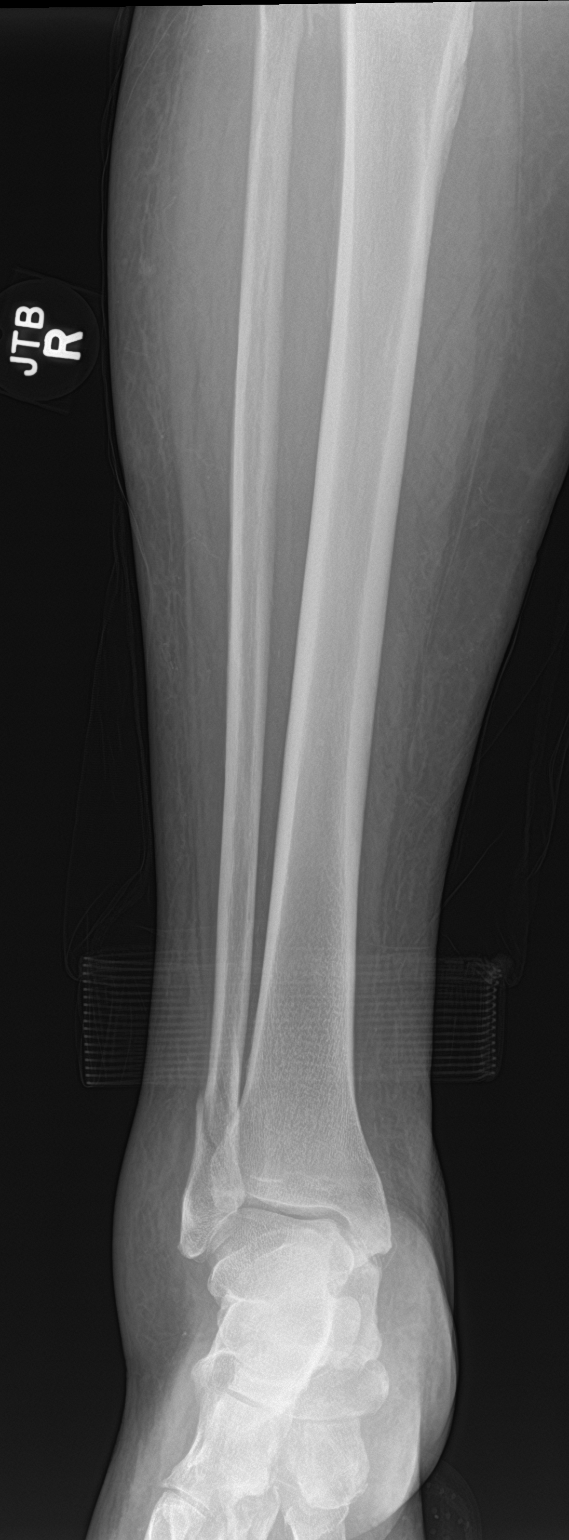

[tibia lat (1 of 2)]
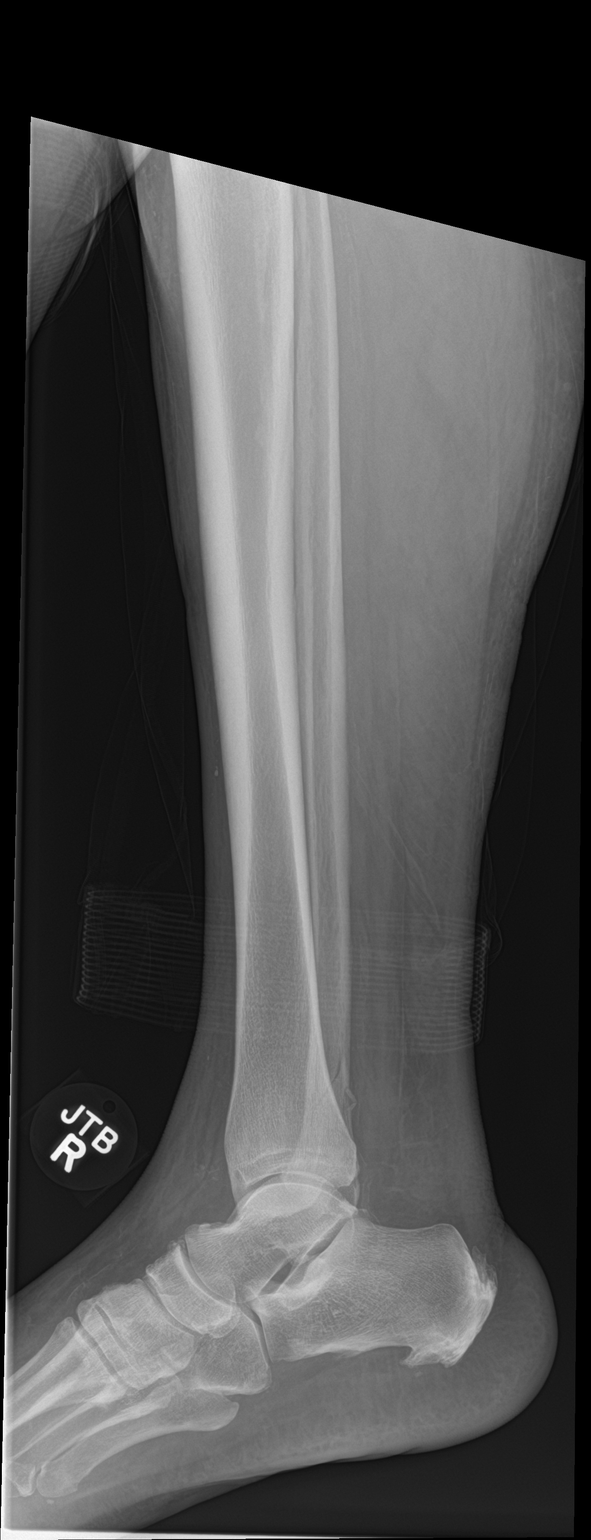

[tibia lat (2 of 2)]
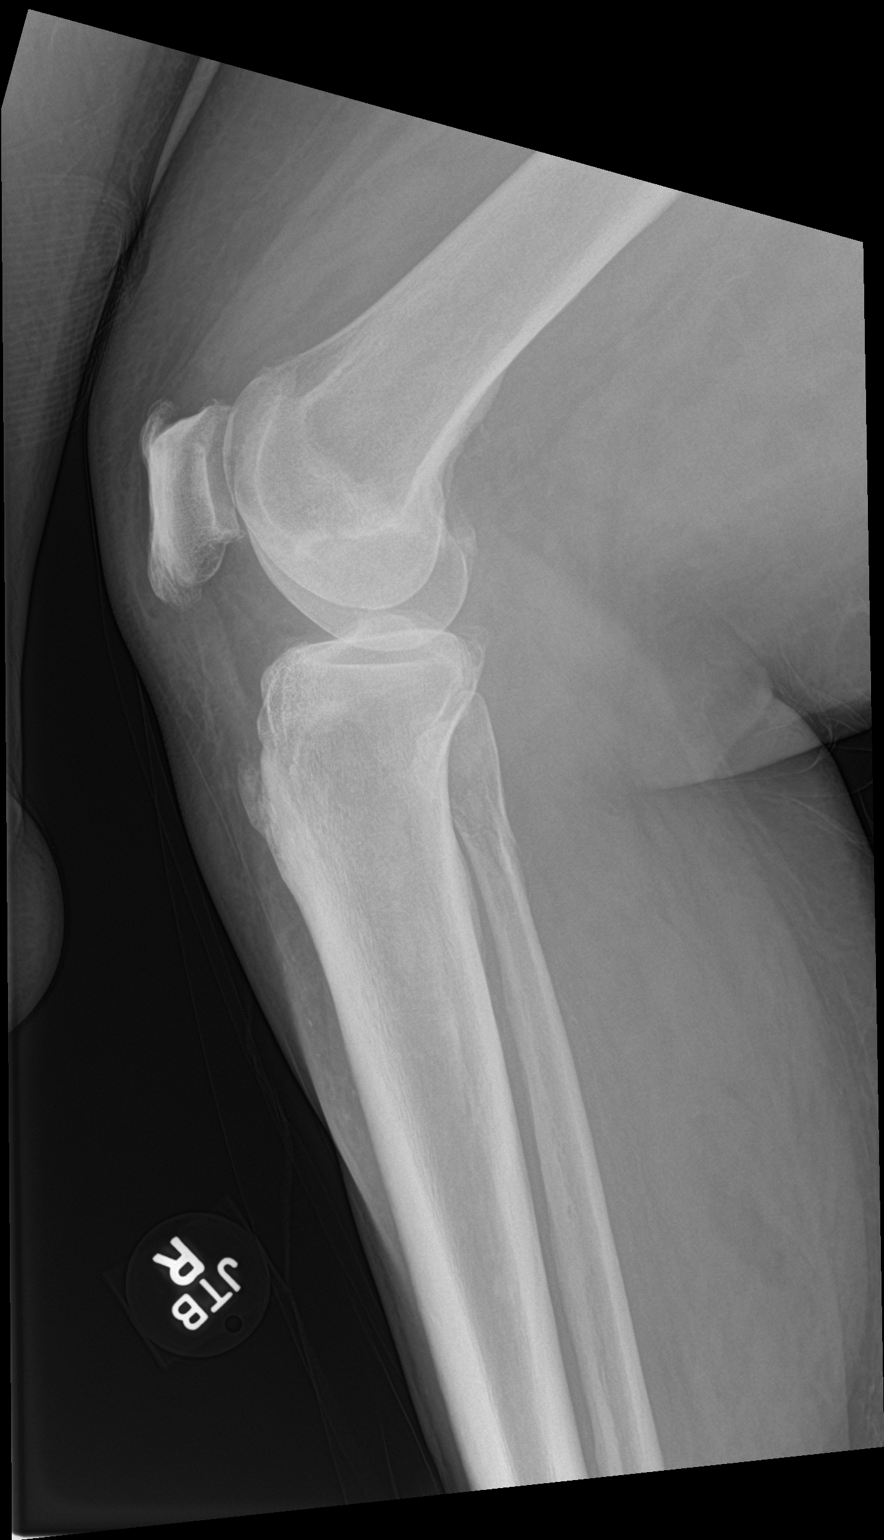

[4 of 4 positions shown; findings below may reference images not displayed]

FINDINGS: A minimally displaced evulsion fracture is again noted at the medial
malleolus. A comminuted oblique fracture of the lateral malleolus
demonstrates minimal displacement. A comminuted nondisplaced
fracture is present in the proximal fibula. The knee is located. No
other significant proximal tibial fractures are present.
Degenerative changes are noted at the knee.
IMPRESSION: 1. Evulsion fracture of the medial malleolus and oblique fracture of
the lateral malleolus suggesting eversion injury.
2. Nondisplaced comminuted fracture of the proximal fibula.

## 2017-12-05 ENCOUNTER — Ambulatory Visit: Payer: Medicare Other | Admitting: Vascular Surgery

## 2018-01-02 ENCOUNTER — Ambulatory Visit: Payer: Medicare Other | Admitting: Vascular Surgery

## 2018-03-20 ENCOUNTER — Ambulatory Visit: Payer: Medicare Other | Admitting: Vascular Surgery

## 2018-03-20 ENCOUNTER — Encounter: Payer: Self-pay | Admitting: Vascular Surgery

## 2018-03-25 ENCOUNTER — Emergency Department (HOSPITAL_BASED_OUTPATIENT_CLINIC_OR_DEPARTMENT_OTHER): Payer: Medicare Other

## 2018-03-25 ENCOUNTER — Encounter (HOSPITAL_BASED_OUTPATIENT_CLINIC_OR_DEPARTMENT_OTHER): Payer: Self-pay | Admitting: Emergency Medicine

## 2018-03-25 ENCOUNTER — Emergency Department (HOSPITAL_BASED_OUTPATIENT_CLINIC_OR_DEPARTMENT_OTHER)
Admission: EM | Admit: 2018-03-25 | Discharge: 2018-03-25 | Disposition: A | Discharge status: Home / Self Care | Payer: Medicare Other | Attending: Emergency Medicine | Admitting: Emergency Medicine

## 2018-03-25 ENCOUNTER — Other Ambulatory Visit: Payer: Self-pay

## 2018-03-25 DIAGNOSIS — R2243 Localized swelling, mass and lump, lower limb, bilateral: Secondary | ICD-10-CM | POA: Insufficient documentation

## 2018-03-25 DIAGNOSIS — Z79899 Other long term (current) drug therapy: Secondary | ICD-10-CM | POA: Diagnosis not present

## 2018-03-25 DIAGNOSIS — E1122 Type 2 diabetes mellitus with diabetic chronic kidney disease: Secondary | ICD-10-CM | POA: Insufficient documentation

## 2018-03-25 DIAGNOSIS — Z7902 Long term (current) use of antithrombotics/antiplatelets: Secondary | ICD-10-CM | POA: Diagnosis not present

## 2018-03-25 DIAGNOSIS — Z7982 Long term (current) use of aspirin: Secondary | ICD-10-CM | POA: Insufficient documentation

## 2018-03-25 DIAGNOSIS — Z794 Long term (current) use of insulin: Secondary | ICD-10-CM | POA: Diagnosis not present

## 2018-03-25 DIAGNOSIS — I120 Hypertensive chronic kidney disease with stage 5 chronic kidney disease or end stage renal disease: Secondary | ICD-10-CM | POA: Insufficient documentation

## 2018-03-25 DIAGNOSIS — R609 Edema, unspecified: Secondary | ICD-10-CM

## 2018-03-25 DIAGNOSIS — N186 End stage renal disease: Secondary | ICD-10-CM | POA: Diagnosis not present

## 2018-03-25 DIAGNOSIS — Z992 Dependence on renal dialysis: Secondary | ICD-10-CM | POA: Diagnosis not present

## 2018-03-25 LAB — CBC
HEMATOCRIT: 28.8 % — AB (ref 36.0–46.0)
Hemoglobin: 9.6 g/dL — ABNORMAL LOW (ref 12.0–15.0)
MCH: 24.8 pg — ABNORMAL LOW (ref 26.0–34.0)
MCHC: 33.3 g/dL (ref 30.0–36.0)
MCV: 74.4 fL — AB (ref 78.0–100.0)
Platelets: 142 10*3/uL — ABNORMAL LOW (ref 150–400)
RBC: 3.87 MIL/uL (ref 3.87–5.11)
RDW: 17 % — AB (ref 11.5–15.5)
WBC: 6.8 10*3/uL (ref 4.0–10.5)

## 2018-03-25 LAB — DIFFERENTIAL
Basophils Absolute: 0 10*3/uL (ref 0.0–0.1)
Basophils Relative: 0 %
EOS ABS: 0.2 10*3/uL (ref 0.0–0.7)
EOS PCT: 3 %
Lymphocytes Relative: 17 %
Lymphs Abs: 1.2 10*3/uL (ref 0.7–4.0)
MONO ABS: 0.6 10*3/uL (ref 0.1–1.0)
MONOS PCT: 9 %
Neutro Abs: 4.8 10*3/uL (ref 1.7–7.7)
Neutrophils Relative %: 71 %

## 2018-03-25 LAB — BASIC METABOLIC PANEL
ANION GAP: 11 (ref 5–15)
BUN: 95 mg/dL — ABNORMAL HIGH (ref 8–23)
CHLORIDE: 110 mmol/L (ref 98–111)
CO2: 15 mmol/L — ABNORMAL LOW (ref 22–32)
Calcium: 9.6 mg/dL (ref 8.9–10.3)
Creatinine, Ser: 9.89 mg/dL — ABNORMAL HIGH (ref 0.44–1.00)
GFR calc Af Amer: 4 mL/min — ABNORMAL LOW (ref >60)
GFR, EST NON AFRICAN AMERICAN: 4 mL/min — AB (ref >60)
Glucose, Bld: 155 mg/dL — ABNORMAL HIGH (ref 70–99)
POTASSIUM: 4.7 mmol/L (ref 3.5–5.1)
SODIUM: 136 mmol/L (ref 135–145)

## 2018-03-25 MED ORDER — METOPROLOL TARTRATE 50 MG PO TABS
50.0000 mg | ORAL_TABLET | Freq: Once | ORAL | Status: AC
  Administered 2018-03-25: 50 mg via ORAL
  Filled 2018-03-25: qty 1

## 2018-03-25 MED ORDER — HYDRALAZINE HCL 25 MG PO TABS
25.0000 mg | ORAL_TABLET | Freq: Once | ORAL | Status: AC
  Administered 2018-03-25: 25 mg via ORAL
  Filled 2018-03-25: qty 1

## 2018-03-25 MED ORDER — ISOSORBIDE MONONITRATE 20 MG PO TABS
20.0000 mg | ORAL_TABLET | Freq: Two times a day (BID) | ORAL | Status: DC
  Filled 2018-03-25: qty 1

## 2018-03-25 NOTE — ED Notes (Signed)
Pt/family verbalized understanding of discharge instructions.   

## 2018-03-25 NOTE — Discharge Instructions (Addendum)
We are quite sure that the leg swelling is because of fluid backing up into your body due to missed dialysis. Otherwise, your blood pressure was elevated but has improved in the ER. We would like you to get your dialysis tomorrow, that will help with the fluid redistribution. Return to the ER immediately if you start having shortness of breath.

## 2018-03-25 NOTE — ED Triage Notes (Signed)
Pt c/o BLE swelling for a couple of weeks; also sts she has not been to dialysis in a couple of weeks bc of transportation issues

## 2018-03-25 NOTE — ED Provider Notes (Addendum)
[facility] EMERGENCY DEPARTMENT Provider Note   CSN: [id number] Arrival date & time: 03/25/18  0913     History   Chief Complaint Chief Complaint  Patient presents with  . Leg Swelling    HPI Phyllis Hernandez is a 67 y.o. female.  HPI 67 year old female with history of diabetes, ESRD on HD Monday, Wednesday, Friday and hypertension, hyperlipidemia. Patient comes into the ER with chief complaint of leg swelling.  Patient reports that she has missed her hemodialysis for the last 2 weeks because of back pain.  She has noted over the past few days that she has started having leg swelling in both of her lower extremities.  Patient denies any chest pain, shortness of breath, palpitations.  Patient will be able to go get her HD tomorrow.  She is also noted to be hypertensive.  Patient has not taken her morning medications.  She denies any chest pain, headache, new focal numbness, tingling, vision changes.  Past Medical History:  Diagnosis Date  . Allergy   . Anemia   . Anemia in CKD (chronic kidney disease)   . Depression   . Diabetes mellitus without complication ([place])   . Hyperlipidemia   . Hypertension     Patient Active Problem List   Diagnosis Date Noted  . Closed displaced fracture of lateral malleolus of right fibula 02/09/2017  . ESRD (end stage renal disease) ([place]) 12/27/2016  . Elevated troponin 12/27/2016  . Anemia of chronic disease 12/27/2016  . Thrombocytopenia ([place]) 12/27/2016  . Constipation 12/27/2016  . Hypertensive emergency 12/09/2016  . Acute on chronic renal failure ([place]) 12/09/2016  . Type 2 diabetes mellitus with complication, without long-term current use of insulin ([place]) 02/27/2007  . Hyperlipidemia 02/27/2007  . Essential hypertension 02/27/2007    Past Surgical History:  Procedure Laterality Date  . AV FISTULA PLACEMENT Left 12/23/2016   Procedure: LEFT UPPER EXTREMITY ARTERIOVENOUS (AV) FISTULA CREATION;  Surgeon: Elam Dutch, MD;  Location: [facility];  Service: Vascular;  Laterality: Left;  . INSERTION OF DIALYSIS CATHETER Right 12/23/2016   Procedure: INSERTION OF DIALYSIS CATHETER;  Surgeon: Elam Dutch, MD;  Location: [facility];  Service: Vascular;  Laterality: Right;  . TUBAL LIGATION       OB History   None      Home Medications    Prior to Admission medications   Medication Sig Start Date End Date Taking? Authorizing Provider  aspirin 81 MG chewable tablet Chew 81 mg by mouth daily.    [provider]  azithromycin (ZITHROMAX) 250 MG tablet Take 1 tablet (250 mg total) by mouth daily. Take first 2 tablets together, then 1 every day until finished. 10/27/17   Ward, Ozella Almond, PA-C  benzonatate (TESSALON) 100 MG capsule Take 1 capsule (100 mg total) by mouth every 8 (eight) hours. 10/27/17   Ward, Ozella Almond, PA-C  bisacodyl (DULCOLAX) 10 MG suppository Place 1 suppository (10 mg total) rectally daily as needed for moderate constipation. 12/28/16   Hongalgi, Lenis Dickinson, MD  calcium acetate (PHOSLO) 667 MG capsule Take 2 capsules (1,334 mg total) by mouth 3 (three) times daily with meals. 12/28/16   Hongalgi, Lenis Dickinson, MD  ferrous sulfate 325 (65 FE) MG tablet Take 325 mg by mouth daily with breakfast.    [provider]  insulin aspart (NOVOLOG) 100 UNIT/ML FlexPen Use as directed 3 times a day with meals and at bedtime snack 06/01/17   Janne Napoleon, NP  Insulin Detemir (  LEVEMIR FLEXTOUCH) 100 UNIT/ML Pen Inject 10 units under the skin at bedtime around 10 PM. This is the flex touch. 08/10/17   Vanessa Kick, MD  Insulin Glargine (LANTUS) 100 UNIT/ML Solostar Pen Inject 25 Units into the skin daily at 10 pm. 12/28/16   Hongalgi, Lenis Dickinson, MD  Insulin Pen Needle 29G X 12MM MISC To be used per instructions 3 times a day with meals and at bedtime. 08/10/17   Vanessa Kick, MD  isosorbide-hydrALAZINE (BIDIL) 20-37.5 MG tablet Take 2 tablets by mouth 3 (three) times daily. 12/28/16   Hongalgi, Lenis Dickinson,  MD  metoprolol tartrate (LOPRESSOR) 50 MG tablet Take 1 tablet (50 mg total) by mouth 2 (two) times daily. 08/10/17   Vanessa Kick, MD  multivitamin (RENA-VIT) TABS tablet Take 1 tablet by mouth at bedtime. 12/28/16   Hongalgi, Lenis Dickinson, MD  oxyCODONE-acetaminophen (PERCOCET/ROXICET) 5-325 MG tablet Take 2 tablets by mouth every 4 (four) hours as needed for severe pain. 01/13/17   Recardo Evangelist, PA-C  polyethylene glycol [facility] / Floria Raveling) packet Take 17 g by mouth 2 (two) times daily. 12/28/16   Hongalgi, Lenis Dickinson, MD  pravastatin (PRAVACHOL) 40 MG tablet Take 40 mg by mouth daily.    [provider]  senna-docusate (SENOKOT-S) 8.6-50 MG tablet Take 1 tablet by mouth 2 (two) times daily. 12/28/16   Hongalgi, Lenis Dickinson, MD    Family History Family History  Problem Relation Age of Onset  . Hypertension Mother   . Diabetes Mother   . Hypertension Maternal Grandmother   . Diabetes Maternal Grandmother     Social History Social History   Tobacco Use  . Smoking status: Never Smoker  . Smokeless tobacco: Never Used  Substance Use Topics  . Alcohol use: No    Frequency: Never  . Drug use: No     Allergies   No known allergies   Review of Systems Review of Systems  Constitutional: Positive for activity change.  Respiratory: Negative for shortness of breath.   Cardiovascular: Negative for chest pain.  Allergic/Immunologic: Negative for immunocompromised state.  Hematological: Does not bruise/bleed easily.  All other systems reviewed and are negative.    Physical Exam Updated Vital Signs BP (!) 166/77   Pulse (!) 57   Temp 98 F (36.7 C) (Oral)   Resp (!) 21   Ht 5\' 5"  (1.651 m)   Wt 100.2 kg   SpO2 99%   BMI 36.78 kg/m   Physical Exam  Constitutional: She is oriented to person, place, and time. She appears well-developed.  HENT:  Head: Normocephalic and atraumatic.  Eyes: Pupils are equal, round, and reactive to light. Conjunctivae and EOM are normal.    Neck: Neck supple.  Distended neck veins  Cardiovascular: Normal rate, regular rhythm and normal heart sounds.  Pulmonary/Chest: Effort normal and breath sounds normal. No respiratory distress. She has no rales.  Abdominal: Soft. Bowel sounds are normal. She exhibits no distension. There is no tenderness.  Musculoskeletal: She exhibits edema.  1+ pitting edema in bilateral lower extremities  Neurological: She is alert and oriented to person, place, and time.  Skin: Skin is warm and dry.  Nursing note and vitals reviewed.    ED Treatments / Results  Labs (all labs ordered are listed, but only abnormal results are displayed) Labs Reviewed  BASIC METABOLIC PANEL - Abnormal; Notable for the following components:      Result Value   CO2 15 (*)    Glucose, Bld 155 (*)  BUN 95 (*)    Creatinine, Ser 9.89 (*)    GFR calc non Af Amer 4 (*)    GFR calc Af Amer 4 (*)    All other components within normal limits  CBC - Abnormal; Notable for the following components:   Hemoglobin 9.6 (*)    HCT 28.8 (*)    MCV 74.4 (*)    MCH 24.8 (*)    RDW 17.0 (*)    Platelets 142 (*)    All other components within normal limits  DIFFERENTIAL  CBC WITH DIFFERENTIAL/PLATELET    EKG EKG Interpretation  Date/Time:  Sunday March 25 2018 10:11:33 EDT Ventricular Rate:  61 PR Interval:    QRS Duration: 95 QT Interval:  473 QTC Calculation: 477 R Axis:   -5 Text Interpretation:  Sinus rhythm No acute changes normal intervals Confirmed by Varney Biles [phone]) on 03/25/2018 10:23:44 AM Also confirmed by Varney Biles [phone]), editor Hattie Perch ([id number])  on 03/25/2018 10:43:05 AM   Radiology Dg Chest 2 View  Result Date: 03/25/2018 CLINICAL DATA:  Bilateral lower extremity swelling EXAM: CHEST - 2 VIEW COMPARISON:  10/27/2017 FINDINGS: Cardiomegaly. Right dialysis catheter remains in place, unchanged. Mild hyperinflation and vascular congestion. No confluent opacities, effusions or  overt edema. No acute bony abnormality. IMPRESSION: Hyperinflation. Cardiomegaly, vascular congestion. Electronically Signed   By: Rolm Baptise M.D.   On: 03/25/2018 10:33    Procedures Procedures (including critical care time)  Medications Ordered in ED Medications  isosorbide mononitrate (ISMO,MONOKET) tablet 20 mg (20 mg Oral Not Given 03/25/18 1034)  hydrALAZINE (APRESOLINE) tablet 25 mg (25 mg Oral Given 03/25/18 1023)  metoprolol tartrate (LOPRESSOR) tablet 50 mg (50 mg Oral Given 03/25/18 1024)     Initial Impression / Assessment and Plan / ED Course  I have reviewed the triage vital signs and the nursing notes.  Pertinent labs & imaging results that were available during my care of the patient were reviewed by me and considered in my medical decision making (see chart for details).  Clinical Course as of Mar 25 1310  Sun Mar 25, 2018  1227 Improved.  BP(!): 166/77 [AN]    Clinical Course User Index [AN] Varney Biles, MD    66 year old female comes in with chief complaint of bilateral leg swelling. She has history of ESRD on HD.  She does not have any known history of CHF or CAD.  She is noted to be hypertensive as well, however she has not taken her medications.  With the elevated blood pressure, patient does not have any symptoms concerning for endorgan damage of the heart, brain.  The pitting edema has been going on for a few days, therefore unlikely related to an acute elevated blood pressure.  I suspect that the leg swelling is mainly because of her missed hemodialysis.  Additionally, patient had missed her morning medication which could explain her elevated blood pressure.  We will give her home dose oral antihypertensives and reassess.  Final Clinical Impressions(s) / ED Diagnoses   Final diagnoses:  Peripheral edema  ESRD (end stage renal disease) [facility])    ED Discharge Orders    None       Varney Biles, MD 03/25/18 [place], Mayu Ronk,  MD 03/25/18 1312

## 2018-04-03 ENCOUNTER — Encounter: Payer: Self-pay | Admitting: Vascular Surgery

## 2018-04-20 ENCOUNTER — Encounter (HOSPITAL_COMMUNITY): Payer: Self-pay | Admitting: Emergency Medicine

## 2018-04-20 ENCOUNTER — Emergency Department (HOSPITAL_COMMUNITY)
Admission: EM | Admit: 2018-04-20 | Discharge: 2018-04-20 | Discharge status: Against Medical Advice | Payer: Medicare Other | Attending: Emergency Medicine | Admitting: Emergency Medicine

## 2018-04-20 ENCOUNTER — Emergency Department (HOSPITAL_COMMUNITY): Payer: Medicare Other

## 2018-04-20 ENCOUNTER — Other Ambulatory Visit: Payer: Self-pay

## 2018-04-20 DIAGNOSIS — I120 Hypertensive chronic kidney disease with stage 5 chronic kidney disease or end stage renal disease: Secondary | ICD-10-CM | POA: Insufficient documentation

## 2018-04-20 DIAGNOSIS — N186 End stage renal disease: Secondary | ICD-10-CM | POA: Diagnosis not present

## 2018-04-20 DIAGNOSIS — Z794 Long term (current) use of insulin: Secondary | ICD-10-CM | POA: Diagnosis not present

## 2018-04-20 DIAGNOSIS — Z79899 Other long term (current) drug therapy: Secondary | ICD-10-CM | POA: Insufficient documentation

## 2018-04-20 DIAGNOSIS — Z7982 Long term (current) use of aspirin: Secondary | ICD-10-CM | POA: Diagnosis not present

## 2018-04-20 DIAGNOSIS — Z992 Dependence on renal dialysis: Secondary | ICD-10-CM | POA: Insufficient documentation

## 2018-04-20 DIAGNOSIS — R109 Unspecified abdominal pain: Secondary | ICD-10-CM | POA: Insufficient documentation

## 2018-04-20 DIAGNOSIS — E119 Type 2 diabetes mellitus without complications: Secondary | ICD-10-CM | POA: Diagnosis not present

## 2018-04-20 LAB — COMPREHENSIVE METABOLIC PANEL
ALT: 54 U/L — AB (ref 0–44)
AST: 27 U/L (ref 15–41)
Albumin: 4.1 g/dL (ref 3.5–5.0)
Alkaline Phosphatase: 47 U/L (ref 38–126)
Anion gap: 11 (ref 5–15)
BUN: 87 mg/dL — AB (ref 8–23)
CHLORIDE: 113 mmol/L — AB (ref 98–111)
CO2: 16 mmol/L — AB (ref 22–32)
CREATININE: 9.53 mg/dL — AB (ref 0.44–1.00)
Calcium: 10.7 mg/dL — ABNORMAL HIGH (ref 8.9–10.3)
GFR calc Af Amer: 4 mL/min — ABNORMAL LOW (ref >60)
GFR, EST NON AFRICAN AMERICAN: 4 mL/min — AB (ref >60)
Glucose, Bld: 143 mg/dL — ABNORMAL HIGH (ref 70–99)
Potassium: 4.6 mmol/L (ref 3.5–5.1)
SODIUM: 140 mmol/L (ref 135–145)
Total Bilirubin: 1.1 mg/dL (ref 0.3–1.2)
Total Protein: 7.3 g/dL (ref 6.5–8.1)

## 2018-04-20 LAB — URINALYSIS, ROUTINE W REFLEX MICROSCOPIC
Bilirubin Urine: NEGATIVE
Glucose, UA: 50 mg/dL — AB
Ketones, ur: NEGATIVE mg/dL
LEUKOCYTES UA: NEGATIVE
Nitrite: NEGATIVE
PH: 5 (ref 5.0–8.0)
Protein, ur: 100 mg/dL — AB
Specific Gravity, Urine: 1.011 (ref 1.005–1.030)

## 2018-04-20 LAB — CBC
HCT: 35.2 % — ABNORMAL LOW (ref 36.0–46.0)
Hemoglobin: 10.1 g/dL — ABNORMAL LOW (ref 12.0–15.0)
MCH: 23.2 pg — ABNORMAL LOW (ref 26.0–34.0)
MCHC: 28.7 g/dL — ABNORMAL LOW (ref 30.0–36.0)
MCV: 80.7 fL (ref 80.0–100.0)
NRBC: 0 % (ref 0.0–0.2)
PLATELETS: 128 10*3/uL — AB (ref 150–400)
RBC: 4.36 MIL/uL (ref 3.87–5.11)
RDW: 16.5 % — ABNORMAL HIGH (ref 11.5–15.5)
WBC: 6.2 10*3/uL (ref 4.0–10.5)

## 2018-04-20 MED ORDER — METOPROLOL TARTRATE 25 MG PO TABS
50.0000 mg | ORAL_TABLET | Freq: Once | ORAL | Status: AC
  Administered 2018-04-20: 50 mg via ORAL
  Filled 2018-04-20: qty 2

## 2018-04-20 NOTE — ED Triage Notes (Signed)
Pt reports that she is here because she needs dialysis. She was told that she should come here by the [facility] st center. States she has not had dialysis in 3 weeks because of back and side pain.

## 2018-04-20 NOTE — ED Provider Notes (Signed)
[facility] EMERGENCY DEPARTMENT Provider Note   CSN: [id number] Arrival date & time: 04/20/18  0856     History   Chief Complaint Chief Complaint  Patient presents with  . Needs Dialysis    HPI Phyllis Hernandez is a 67 y.o. female.  The history is provided by the patient. No language interpreter was used.    Phyllis Hernandez is a 67 y.o. female who presents to the Emergency Department complaining of needs dialysis, side pain. She has a history of ESR D and is on dialysis Monday, Wednesday, Friday. She believes it has been three weeks since her last dialysis session and but is unsure of the date. She states that she received letter from her dialysis center stating to go to the emergency department for dialysis. She urinates multiple times daily. She does endorse progressive edema of her legs as well as dyspnea on exertion. She denies any fevers, chest pain. She states that she has not gone to dialysis because of intermittent nausea and vomiting as well as right side pain. The pain began when she was working and thinks she pulled a muscle. She has been trying ibuprofen and Tylenol for the pain with no significant improvement in her symptoms. Level V caveat due to confusion.  Past Medical History:  Diagnosis Date  . Allergy   . Anemia   . Anemia in CKD (chronic kidney disease)   . Depression   . Diabetes mellitus without complication ([place])   . Hyperlipidemia   . Hypertension     Patient Active Problem List   Diagnosis Date Noted  . Closed displaced fracture of lateral malleolus of right fibula 02/09/2017  . ESRD (end stage renal disease) ([place]) 12/27/2016  . Elevated troponin 12/27/2016  . Anemia of chronic disease 12/27/2016  . Thrombocytopenia ([place]) 12/27/2016  . Constipation 12/27/2016  . Hypertensive emergency 12/09/2016  . Acute on chronic renal failure ([place]) 12/09/2016  . Type 2 diabetes mellitus with complication, without long-term current use of  insulin ([place]) 02/27/2007  . Hyperlipidemia 02/27/2007  . Essential hypertension 02/27/2007    Past Surgical History:  Procedure Laterality Date  . AV FISTULA PLACEMENT Left 12/23/2016   Procedure: LEFT UPPER EXTREMITY ARTERIOVENOUS (AV) FISTULA CREATION;  Surgeon: Elam Dutch, MD;  Location: [facility];  Service: Vascular;  Laterality: Left;  . INSERTION OF DIALYSIS CATHETER Right 12/23/2016   Procedure: INSERTION OF DIALYSIS CATHETER;  Surgeon: Elam Dutch, MD;  Location: [facility];  Service: Vascular;  Laterality: Right;  . TUBAL LIGATION       OB History   None      Home Medications    Prior to Admission medications   Medication Sig Start Date End Date Taking? Authorizing Provider  aspirin 81 MG chewable tablet Chew 81 mg by mouth daily.   Yes [provider]  calcium acetate (PHOSLO) 667 MG capsule Take 2 capsules (1,334 mg total) by mouth 3 (three) times daily with meals. 12/28/16  Yes Hongalgi, Lenis Dickinson, MD  ferrous sulfate 325 (65 FE) MG tablet Take 325 mg by mouth daily with breakfast.   Yes [provider]  insulin aspart (NOVOLOG) 100 UNIT/ML FlexPen Use as directed 3 times a day with meals and at bedtime snack Patient taking differently: Inject 3 Units into the skin 3 (three) times daily with meals.  06/01/17  Yes Mabe, Shanon Brow, NP  Insulin Detemir (LEVEMIR FLEXTOUCH) 100 UNIT/ML Pen Inject 10 units under the skin at bedtime around 10  PM. This is the flex touch. Patient taking differently: Inject 10 Units into the skin at bedtime.  08/10/17  Yes Hagler, Aaron Edelman, MD  isosorbide-hydrALAZINE (BIDIL) 20-37.5 MG tablet Take 2 tablets by mouth 3 (three) times daily. 12/28/16  Yes Hongalgi, Lenis Dickinson, MD  metoprolol tartrate (LOPRESSOR) 50 MG tablet Take 1 tablet (50 mg total) by mouth 2 (two) times daily. 08/10/17  Yes Hagler, Aaron Edelman, MD  multivitamin (RENA-VIT) TABS tablet Take 1 tablet by mouth at bedtime. 12/28/16  Yes Hongalgi, Lenis Dickinson, MD  senna-docusate (SENOKOT-S)  8.6-50 MG tablet Take 1 tablet by mouth 2 (two) times daily. 12/28/16  Yes Hongalgi, Lenis Dickinson, MD  benzonatate (TESSALON) 100 MG capsule Take 1 capsule (100 mg total) by mouth every 8 (eight) hours. Patient not taking: Reported on 04/20/2018 10/27/17   Ward, Ozella Almond, PA-C  bisacodyl (DULCOLAX) 10 MG suppository Place 1 suppository (10 mg total) rectally daily as needed for moderate constipation. Patient not taking: Reported on 04/20/2018 12/28/16   Modena Jansky, MD  Insulin Glargine (LANTUS) 100 UNIT/ML Solostar Pen Inject 25 Units into the skin daily at 10 pm. Patient not taking: Reported on 04/20/2018 12/28/16   Modena Jansky, MD  Insulin Pen Needle 29G X 12MM MISC To be used per instructions 3 times a day with meals and at bedtime. 08/10/17   Vanessa Kick, MD  oxyCODONE-acetaminophen (PERCOCET/ROXICET) 5-325 MG tablet Take 2 tablets by mouth every 4 (four) hours as needed for severe pain. Patient not taking: Reported on 04/20/2018 01/13/17   Recardo Evangelist, PA-C  polyethylene glycol [facility] / Floria Raveling) packet Take 17 g by mouth 2 (two) times daily. Patient not taking: Reported on 04/20/2018 12/28/16   Modena Jansky, MD    Family History Family History  Problem Relation Age of Onset  . Hypertension Mother   . Diabetes Mother   . Hypertension Maternal Grandmother   . Diabetes Maternal Grandmother     Social History Social History   Tobacco Use  . Smoking status: Never Smoker  . Smokeless tobacco: Never Used  Substance Use Topics  . Alcohol use: No    Frequency: Never  . Drug use: No     Allergies   No known allergies   Review of Systems Review of Systems  All other systems reviewed and are negative.    Physical Exam Updated Vital Signs BP 117/74   Pulse (!) 58   Temp (!) 97.4 F (36.3 C) (Oral)   Resp (!) 24   SpO2 100%   Physical Exam  Constitutional: She is oriented to person, place, and time. She appears well-developed and well-nourished. No  distress.  HENT:  Head: Normocephalic and atraumatic.  Mild Periorbital edema  Cardiovascular: Normal rate and regular rhythm.  No murmur heard. Pulmonary/Chest: Effort normal and breath sounds normal. No respiratory distress.  Vas cath in right anterior chest wall with dressing in place.   Abdominal: Soft. There is no tenderness. There is no rebound and no guarding.  No CVA tenderness. No rash or lesion.    Musculoskeletal: She exhibits no tenderness.  Nonpitting edema to bilateral lower extremities. 2+ DP pulses bilaterally  Neurological: She is alert and oriented to person, place, and time.  Skin: Skin is warm and dry.  Psychiatric: She has a normal mood and affect. Her behavior is normal.  Nursing note and vitals reviewed.    ED Treatments / Results  Labs (all labs ordered are listed, but only abnormal results are displayed) Labs  Reviewed  URINALYSIS, ROUTINE W REFLEX MICROSCOPIC - Abnormal; Notable for the following components:      Result Value   Color, Urine STRAW (*)    Glucose, UA 50 (*)    Hgb urine dipstick SMALL (*)    Protein, ur 100 (*)    Bacteria, UA RARE (*)    All other components within normal limits  CBC - Abnormal; Notable for the following components:   Hemoglobin 10.1 (*)    HCT 35.2 (*)    MCH 23.2 (*)    MCHC 28.7 (*)    RDW 16.5 (*)    Platelets 128 (*)    All other components within normal limits  COMPREHENSIVE METABOLIC PANEL - Abnormal; Notable for the following components:   Chloride 113 (*)    CO2 16 (*)    Glucose, Bld 143 (*)    BUN 87 (*)    Creatinine, Ser 9.53 (*)    Calcium 10.7 (*)    ALT 54 (*)    GFR calc non Af Amer 4 (*)    GFR calc Af Amer 4 (*)    All other components within normal limits  URINE CULTURE    EKG EKG Interpretation  Date/Time:  Friday April 20 2018 09:44:59 EDT Ventricular Rate:  64 PR Interval:    QRS Duration: 94 QT Interval:  453 QTC Calculation: 468 R Axis:   -27 Text Interpretation:  Sinus  rhythm Borderline left axis deviation Abnormal R-wave progression, late transition Confirmed by Quintella Reichert [phone]) on 04/20/2018 9:49:09 AM   Radiology Ct Renal Stone Study  Result Date: 04/20/2018 CLINICAL DATA:  RIGHT-sided flank pain for 2 weeks. EXAM: CT ABDOMEN AND PELVIS WITHOUT CONTRAST TECHNIQUE: Multidetector CT imaging of the abdomen and pelvis was performed following the standard protocol without IV contrast. COMPARISON:  None. FINDINGS: Lower chest: Mild atelectasis at the LEFT lung base. Hepatobiliary: No focal liver abnormality is seen. No gallstones, gallbladder wall thickening, or biliary dilatation. Pancreas: Unremarkable. No pancreatic ductal dilatation or surrounding inflammatory changes. Spleen: Normal in size without focal abnormality. Adrenals/Urinary Tract: Mild RIGHT-sided hydronephrosis and perinephric inflammation. No ureteral stone identified. LEFT kidney is unremarkable without stone or hydronephrosis. Bladder appears normal, partially decompressed. No bladder stone. Stomach/Bowel: No dilated large or small bowel loops. No evidence of bowel wall inflammation. Appendix is normal. Stomach is unremarkable, partially decompressed. Vascular/Lymphatic: Aortic atherosclerosis. No enlarged abdominal or pelvic lymph nodes. Reproductive: Uterus and bilateral adnexa are unremarkable. Other: No free fluid or abscess collection. No free intraperitoneal air. Musculoskeletal: No acute or suspicious osseous finding. Degenerative spondylosis throughout the thoracolumbar spine, at least moderate in degree. IMPRESSION: 1. Mild RIGHT-sided hydronephrosis and perinephric inflammation. No renal, ureteral or bladder calculi identified. Findings could represent recently passed stone or ascending infection with pyelonephritis. Recommend correlation with clinical presentation and urinalysis to differentiate between these 2 possibilities. 2. Remainder of the exam is unremarkable for acute process. No  bowel obstruction or evidence of bowel wall inflammation. No free fluid or abscess. Appendix is normal. 3.  Aortic Atherosclerosis (ICD10-I70.0). Electronically Signed   By: Franki Cabot M.D.   On: 04/20/2018 10:13    Procedures Procedures (including critical care time)  Medications Ordered in ED Medications  metoprolol tartrate (LOPRESSOR) tablet 50 mg (50 mg Oral Given 04/20/18 1100)     Initial Impression / Assessment and Plan / ED Course  I have reviewed the triage vital signs and the nursing notes.  Pertinent labs & imaging results that were available  during my care of the patient were reviewed by me and considered in my medical decision making (see chart for details).    ESR D on hemodialysis here for evaluation for dialysis as well as flank pain. She is edematous on examination with no respiratory distress. She was hypotensive on ED arrival and blood pressure improved after administration of her home medications. In terms of her flank pain a CT renal stone was obtained that demonstrates mild hydronephrosis. UA is not consistent with UTI. Will send a culture. Discussed with nephrology regarding arranging dialysis. Nephrology plans to dialyzed the patient from the emergency department and patient refused stating that she had to leave to pay bills. Discussed concerns about patient leaving her she has not had dialysis since September 4 of 2019 per nephrology. Concern for volume overload, cardiac arrest, pulmonary edema. Discussed with patient that she would be leaving against medical advice if she were to leave the department today. Patient voiced understanding and declined admission or just in the hospital. She was discharged from the emergency department against medical advice.  Final Clinical Impressions(s) / ED Diagnoses   Final diagnoses:  ESRD (end stage renal disease) on dialysis [facility])  Flank pain    ED Discharge Orders    None       Quintella Reichert, MD 04/20/18 1538

## 2018-04-20 NOTE — ED Notes (Signed)
Pur Wick placed . Pt unable to urinate at this time

## 2018-04-20 NOTE — ED Notes (Signed)
Pt wants to leave AMA states she has to "go pay her rent." pt got herself unhooked and dressed. Dialysis nurse at the bedside changing her catheter to chest.

## 2018-04-22 LAB — URINE CULTURE

## 2018-04-23 ENCOUNTER — Telehealth: Payer: Self-pay | Admitting: Emergency Medicine

## 2018-04-23 NOTE — Telephone Encounter (Signed)
Post ED Visit - Positive Culture Follow-up  Culture report reviewed by antimicrobial stewardship pharmacist:  []  Elenor Quinones, Pharm.D. []  Heide Guile, Pharm.D., BCPS AQ-ID []  Parks Neptune, Pharm.D., BCPS []  Alycia Rossetti, Pharm.D., BCPS []  [place], Pharm.D., BCPS, AAHIVP []  Legrand Como, Pharm.D., BCPS, AAHIVP []  Salome Arnt, PharmD, BCPS []  Johnnette Gourd, PharmD, BCPS [x]  Hughes Better, PharmD, BCPS []  Leeroy Cha, PharmD  Positive uirne culture Treated with none, asymptomatic,  no further patient follow-up is required at this time.  Hazle Nordmann 04/23/2018, 9:46 AM

## 2018-04-25 ENCOUNTER — Emergency Department (HOSPITAL_COMMUNITY): Payer: Medicare Other

## 2018-04-25 ENCOUNTER — Non-Acute Institutional Stay (HOSPITAL_COMMUNITY)
Admission: EM | Admit: 2018-04-25 | Discharge: 2018-04-26 | Disposition: A | Discharge status: Home / Self Care | Payer: Medicare Other | Attending: Emergency Medicine | Admitting: Emergency Medicine

## 2018-04-25 ENCOUNTER — Encounter (HOSPITAL_COMMUNITY): Payer: Self-pay | Admitting: Emergency Medicine

## 2018-04-25 DIAGNOSIS — Z992 Dependence on renal dialysis: Secondary | ICD-10-CM | POA: Diagnosis not present

## 2018-04-25 DIAGNOSIS — Z8249 Family history of ischemic heart disease and other diseases of the circulatory system: Secondary | ICD-10-CM | POA: Diagnosis not present

## 2018-04-25 DIAGNOSIS — E1122 Type 2 diabetes mellitus with diabetic chronic kidney disease: Secondary | ICD-10-CM | POA: Diagnosis not present

## 2018-04-25 DIAGNOSIS — I120 Hypertensive chronic kidney disease with stage 5 chronic kidney disease or end stage renal disease: Secondary | ICD-10-CM | POA: Diagnosis not present

## 2018-04-25 DIAGNOSIS — N186 End stage renal disease: Secondary | ICD-10-CM | POA: Diagnosis not present

## 2018-04-25 DIAGNOSIS — M791 Myalgia, unspecified site: Secondary | ICD-10-CM

## 2018-04-25 DIAGNOSIS — E8770 Fluid overload, unspecified: Secondary | ICD-10-CM

## 2018-04-25 DIAGNOSIS — R109 Unspecified abdominal pain: Secondary | ICD-10-CM

## 2018-04-25 DIAGNOSIS — R531 Weakness: Secondary | ICD-10-CM | POA: Diagnosis present

## 2018-04-25 LAB — URINALYSIS, ROUTINE W REFLEX MICROSCOPIC
BILIRUBIN URINE: NEGATIVE
Glucose, UA: NEGATIVE mg/dL
Ketones, ur: NEGATIVE mg/dL
LEUKOCYTES UA: NEGATIVE
NITRITE: NEGATIVE
Protein, ur: 100 mg/dL — AB
SPECIFIC GRAVITY, URINE: 1.009 (ref 1.005–1.030)
pH: 5 (ref 5.0–8.0)

## 2018-04-25 LAB — COMPREHENSIVE METABOLIC PANEL
ALK PHOS: 46 U/L (ref 38–126)
ALT: 47 U/L — ABNORMAL HIGH (ref 0–44)
ANION GAP: 11 (ref 5–15)
AST: 26 U/L (ref 15–41)
Albumin: 3.8 g/dL (ref 3.5–5.0)
BUN: 83 mg/dL — ABNORMAL HIGH (ref 8–23)
CO2: 16 mmol/L — AB (ref 22–32)
Calcium: 10.5 mg/dL — ABNORMAL HIGH (ref 8.9–10.3)
Chloride: 111 mmol/L (ref 98–111)
Creatinine, Ser: 9.35 mg/dL — ABNORMAL HIGH (ref 0.44–1.00)
GFR calc non Af Amer: 4 mL/min — ABNORMAL LOW (ref >60)
GFR, EST AFRICAN AMERICAN: 4 mL/min — AB (ref >60)
Glucose, Bld: 142 mg/dL — ABNORMAL HIGH (ref 70–99)
Potassium: 4.2 mmol/L (ref 3.5–5.1)
SODIUM: 138 mmol/L (ref 135–145)
TOTAL PROTEIN: 6.8 g/dL (ref 6.5–8.1)
Total Bilirubin: 1.1 mg/dL (ref 0.3–1.2)

## 2018-04-25 LAB — CBC
HCT: 32.4 % — ABNORMAL LOW (ref 36.0–46.0)
HEMOGLOBIN: 9.4 g/dL — AB (ref 12.0–15.0)
MCH: 22.5 pg — ABNORMAL LOW (ref 26.0–34.0)
MCHC: 29 g/dL — ABNORMAL LOW (ref 30.0–36.0)
MCV: 77.7 fL — ABNORMAL LOW (ref 80.0–100.0)
Platelets: 140 10*3/uL — ABNORMAL LOW (ref 150–400)
RBC: 4.17 MIL/uL (ref 3.87–5.11)
RDW: 16.4 % — ABNORMAL HIGH (ref 11.5–15.5)
WBC: 5.2 10*3/uL (ref 4.0–10.5)
nRBC: 0 % (ref 0.0–0.2)

## 2018-04-25 MED ORDER — HEPARIN SODIUM (PORCINE) 1000 UNIT/ML DIALYSIS
1000.0000 [IU] | INTRAMUSCULAR | Status: DC | PRN
  Administered 2018-04-26: 1000 [IU] via INTRAVENOUS_CENTRAL
  Filled 2018-04-25 (×2): qty 1

## 2018-04-25 MED ORDER — LIDOCAINE HCL (PF) 1 % IJ SOLN: 5.0000 mL | INTRAMUSCULAR | Status: DC | PRN

## 2018-04-25 MED ORDER — CYCLOBENZAPRINE HCL 10 MG PO TABS: 10.0000 mg | ORAL_TABLET | Freq: Two times a day (BID) | ORAL | 0 refills | Status: DC | PRN

## 2018-04-25 MED ORDER — SODIUM CHLORIDE 0.9 % IV SOLN: 100.0000 mL | INTRAVENOUS | Status: DC | PRN

## 2018-04-25 MED ORDER — LIDOCAINE-PRILOCAINE 2.5-2.5 % EX CREA
1.0000 "application " | TOPICAL_CREAM | CUTANEOUS | Status: DC | PRN
  Filled 2018-04-25: qty 5

## 2018-04-25 MED ORDER — PENTAFLUOROPROP-TETRAFLUOROETH EX AERO
1.0000 "application " | INHALATION_SPRAY | CUTANEOUS | Status: DC | PRN
  Filled 2018-04-25: qty 116

## 2018-04-25 MED ORDER — ALTEPLASE 2 MG IJ SOLR: 2.0000 mg | Freq: Once | INTRAMUSCULAR | Status: DC | PRN

## 2018-04-25 MED ORDER — DARBEPOETIN ALFA 60 MCG/0.3ML IJ SOSY
60.0000 ug | PREFILLED_SYRINGE | Freq: Once | INTRAMUSCULAR | Status: AC
  Administered 2018-04-26: 60 ug via INTRAVENOUS

## 2018-04-25 MED ORDER — CHLORHEXIDINE GLUCONATE CLOTH 2 % EX PADS: 6.0000 | MEDICATED_PAD | Freq: Every day | CUTANEOUS | Status: DC

## 2018-04-25 NOTE — ED Provider Notes (Signed)
  Physical Exam  BP 134/82   Pulse 62   Temp 98.5 F (36.9 C) (Oral)   Resp (!) 21   Wt 99.8 kg   SpO2 97%   BMI 36.61 kg/m   Physical Exam  ED Course/Procedures     Procedures  MDM  Care assumed at 4 pm from Dr. Billy Fischer. Patient uncompliant with dialysis and came for flank pain. CT unremarkable. Labs showed baseline renal failure, K normal. Plan to dialyze patient and then discharge patient.   7 pm Patient didn't want to wait but I talked to her about her condition. She initially wanted to sign AMA paperwork but then is ok with staying for dialysis.   11 pm Patient went to dialysis. DC paper work done and patient can be discharged after dialysis.    Drenda Freeze, MD 04/25/18 [phone]

## 2018-04-25 NOTE — Discharge Instructions (Addendum)
Take your medicine as prescribed.   Go to your dialysis center as scheduled. Please do not miss any sessions  See your doctor  Return to ER if you have vomiting, worse flank pain, chest pain, trouble breathing

## 2018-04-25 NOTE — ED Provider Notes (Signed)
[facility] EMERGENCY DEPARTMENT Provider Note   CSN: [id number] Arrival date & time: 04/25/18  1137     History   Chief Complaint Chief Complaint  Patient presents with  . Weakness  . No dialysis 3 weeks    HPI Phyllis Hernandez is a 67 y.o. female.  HPI  Came to ED because decided she did want dialysis, wants fluid off. Was previously going to [facility]. Was told couldn't go there anymore.    Has not had dialysis since September 4th, was seen 11/1, plan to dialyze patient but she left AMA. This discussion was after she had presented with flank pain.  CT had shown recently passed stone or possible ascending infection.  Now with flank pain right side for 3 weeks, worse with movements  Somewhat short of breath, mild  Reports flank pain about the same.  No nausea or vomiting. No fevers.  No dysuria. Still making plenty of urine. Feels like urinating more but no change from recent eval.    Past Medical History:  Diagnosis Date  . Allergy   . Anemia   . Anemia in CKD (chronic kidney disease)   . Depression   . Diabetes mellitus without complication ([place])   . Hyperlipidemia   . Hypertension     Patient Active Problem List   Diagnosis Date Noted  . Closed displaced fracture of lateral malleolus of right fibula 02/09/2017  . ESRD (end stage renal disease) ([place]) 12/27/2016  . Elevated troponin 12/27/2016  . Anemia of chronic disease 12/27/2016  . Thrombocytopenia ([place]) 12/27/2016  . Constipation 12/27/2016  . Hypertensive emergency 12/09/2016  . Acute on chronic renal failure ([place]) 12/09/2016  . Type 2 diabetes mellitus with complication, without long-term current use of insulin ([place]) 02/27/2007  . Hyperlipidemia 02/27/2007  . Essential hypertension 02/27/2007    Past Surgical History:  Procedure Laterality Date  . AV FISTULA PLACEMENT Left 12/23/2016   Procedure: LEFT UPPER EXTREMITY ARTERIOVENOUS (AV) FISTULA CREATION;   Surgeon: Elam Dutch, MD;  Location: [facility];  Service: Vascular;  Laterality: Left;  . INSERTION OF DIALYSIS CATHETER Right 12/23/2016   Procedure: INSERTION OF DIALYSIS CATHETER;  Surgeon: Elam Dutch, MD;  Location: [facility];  Service: Vascular;  Laterality: Right;  . TUBAL LIGATION       OB History   None      Home Medications    Prior to Admission medications   Medication Sig Start Date End Date Taking? Authorizing Provider  aspirin 81 MG chewable tablet Chew 81 mg by mouth daily.    [provider]  benzonatate (TESSALON) 100 MG capsule Take 1 capsule (100 mg total) by mouth every 8 (eight) hours. Patient not taking: Reported on 04/20/2018 10/27/17   Ward, Ozella Almond, PA-C  bisacodyl (DULCOLAX) 10 MG suppository Place 1 suppository (10 mg total) rectally daily as needed for moderate constipation. Patient not taking: Reported on 04/20/2018 12/28/16   Modena Jansky, MD  calcium acetate (PHOSLO) 667 MG capsule Take 2 capsules (1,334 mg total) by mouth 3 (three) times daily with meals. 12/28/16   Hongalgi, Lenis Dickinson, MD  cyclobenzaprine (FLEXERIL) 10 MG tablet Take 1 tablet (10 mg total) by mouth 2 (two) times daily as needed for muscle spasms. 04/25/18   Gareth Morgan, MD  ferrous sulfate 325 (65 FE) MG tablet Take 325 mg by mouth daily with breakfast.    [provider]  insulin aspart (NOVOLOG) 100  UNIT/ML FlexPen Use as directed 3 times a day with meals and at bedtime snack Patient taking differently: Inject 3 Units into the skin 3 (three) times daily with meals.  06/01/17   Janne Napoleon, NP  Insulin Detemir (LEVEMIR FLEXTOUCH) 100 UNIT/ML Pen Inject 10 units under the skin at bedtime around 10 PM. This is the flex touch. Patient taking differently: Inject 10 Units into the skin at bedtime.  08/10/17   Vanessa Kick, MD  Insulin Glargine (LANTUS) 100 UNIT/ML Solostar Pen Inject 25 Units into the skin daily at 10 pm. Patient not taking: Reported on 04/20/2018  12/28/16   Modena Jansky, MD  Insulin Pen Needle 29G X 12MM MISC To be used per instructions 3 times a day with meals and at bedtime. 08/10/17   Vanessa Kick, MD  isosorbide-hydrALAZINE (BIDIL) 20-37.5 MG tablet Take 2 tablets by mouth 3 (three) times daily. 12/28/16   Hongalgi, Lenis Dickinson, MD  metoprolol tartrate (LOPRESSOR) 50 MG tablet Take 1 tablet (50 mg total) by mouth 2 (two) times daily. 08/10/17   Vanessa Kick, MD  multivitamin (RENA-VIT) TABS tablet Take 1 tablet by mouth at bedtime. 12/28/16   Hongalgi, Lenis Dickinson, MD  oxyCODONE-acetaminophen (PERCOCET/ROXICET) 5-325 MG tablet Take 2 tablets by mouth every 4 (four) hours as needed for severe pain. Patient not taking: Reported on 04/20/2018 01/13/17   Recardo Evangelist, PA-C  polyethylene glycol [facility]) packet Take 17 g by mouth 2 (two) times daily. Patient not taking: Reported on 04/20/2018 12/28/16   Modena Jansky, MD  senna-docusate (SENOKOT-S) 8.6-50 MG tablet Take 1 tablet by mouth 2 (two) times daily. 12/28/16   Hongalgi, Lenis Dickinson, MD    Family History Family History  Problem Relation Age of Onset  . Hypertension Mother   . Diabetes Mother   . Hypertension Maternal Grandmother   . Diabetes Maternal Grandmother     Social History Social History   Tobacco Use  . Smoking status: Never Smoker  . Smokeless tobacco: Never Used  Substance Use Topics  . Alcohol use: No    Frequency: Never  . Drug use: No     Allergies   No known allergies   Review of Systems Review of Systems  Constitutional: Negative for fever.  HENT: Negative for sore throat.   Eyes: Negative for visual disturbance.  Respiratory: Negative for cough and shortness of breath.   Cardiovascular: Negative for chest pain.  Gastrointestinal: Negative for abdominal pain.  Genitourinary: Positive for flank pain. Negative for difficulty urinating.  Musculoskeletal: Negative for back pain and neck pain.  Skin: Negative for rash.  Neurological:  Negative for syncope and headaches.     Physical Exam Updated Vital Signs BP (!) 153/62   Pulse 62   Temp 98.5 F (36.9 C) (Oral)   Resp (!) 25   Wt 99.8 kg   SpO2 97%   BMI 36.61 kg/m   Physical Exam  Constitutional: She is oriented to person, place, and time. She appears well-developed and well-nourished. No distress.  HENT:  Head: Normocephalic and atraumatic.  Eyes: Conjunctivae and EOM are normal.  Neck: Normal range of motion.  Cardiovascular: Normal rate, regular rhythm, normal heart sounds and intact distal pulses. Exam reveals no gallop and no friction rub.  No murmur heard. Pulmonary/Chest: Effort normal and breath sounds normal. No respiratory distress. She has no wheezes. She has no rales.  Abdominal: Soft. She exhibits no distension. There is tenderness (right flank, right lateral back tenderness). There  is no guarding.  Musculoskeletal: She exhibits no edema or tenderness.  Neurological: She is alert and oriented to person, place, and time.  Skin: Skin is warm and dry. No rash noted. She is not diaphoretic. No erythema.  Nursing note and vitals reviewed.    ED Treatments / Results  Labs (all labs ordered are listed, but only abnormal results are displayed) Labs Reviewed  CBC - Abnormal; Notable for the following components:      Result Value   Hemoglobin 9.4 (*)    HCT 32.4 (*)    MCV 77.7 (*)    MCH 22.5 (*)    MCHC 29.0 (*)    RDW 16.4 (*)    Platelets 140 (*)    All other components within normal limits  URINALYSIS, ROUTINE W REFLEX MICROSCOPIC - Abnormal; Notable for the following components:   Color, Urine STRAW (*)    Hgb urine dipstick SMALL (*)    Protein, ur 100 (*)    Bacteria, UA RARE (*)    All other components within normal limits  COMPREHENSIVE METABOLIC PANEL - Abnormal; Notable for the following components:   CO2 16 (*)    Glucose, Bld 142 (*)    BUN 83 (*)    Creatinine, Ser 9.35 (*)    Calcium 10.5 (*)    ALT 47 (*)    GFR  calc non Af Amer 4 (*)    GFR calc Af Amer 4 (*)    All other components within normal limits    EKG EKG Interpretation  Date/Time:  Wednesday April 25 2018 11:47:44 EST Ventricular Rate:  72 PR Interval:    QRS Duration: 93 QT Interval:  429 QTC Calculation: 470 R Axis:   -10 Text Interpretation:  Sinus rhythm Atrial premature complex No significant change since last tracing Confirmed by Gareth Morgan [phone]) on 04/25/2018 12:03:26 PM   Radiology US Renal  Result Date: 04/25/2018 CLINICAL DATA:  Right flank pain. EXAM: RENAL / URINARY TRACT ULTRASOUND COMPLETE COMPARISON:  CT scan 04/20/2018 FINDINGS: Right Kidney: Renal measurements: 11.1 x 4.0 x 5.7 cm = volume: 133 cubic cm. ML. Normal renal cortical thickness. Mild increased echogenicity. No worrisome renal lesions or hydronephrosis. Left Kidney: Renal measurements: 10.8 x 5.8 x 6.7 cm = volume: 221.2 cubic cm mL. Mild increased echogenicity but normal renal cortical thickness and no worrisome lesions or hydronephrosis. Bladder: Appears normal for degree of bladder distention. IMPRESSION: 1. Normal renal cortical thickness and no worrisome renal lesions or hydronephrosis. 2. Mild increased echogenicity could suggest medical renal disease. 3. Normal bladder. Electronically Signed   By: Marijo Sanes M.D.   On: 04/25/2018 15:06    Procedures Procedures (including critical care time)  Medications Ordered in ED Medications  Chlorhexidine Gluconate Cloth 2 % PADS 6 each (has no administration in time range)  Darbepoetin Alfa (ARANESP) injection 60 mcg (has no administration in time range)     Initial Impression / Assessment and Plan / ED Course  I have reviewed the triage vital signs and the nursing notes.  Pertinent labs & imaging results that were available during my care of the patient were reviewed by me and considered in my medical decision making (see chart for details).     67yo female with history above presents  with desire for dialysis after she left AMA 11/1 without receiving dialysis.  Had previously presented with right flank pain which has been persistent--CT 11/1 showed mild right hydronephrosis, urine cx showed less than 20000  staph, suspect likely contaminent.  Given persistent pain, ordered US and discussed with urology. US shows no hydronephrosis.  Given no dysuria, no fevers, no leukocytosis doubt pyelonephritis. Suspect likely MSK etiology of pain. Given flexeril rx.    Regarding dialysis--pt has not had since 9/4. She has no hypoxia, no hyperkalemia. She is hypertensive.  Contacted Nephrology, Dr. Johnney Ou, who reviewed patient's chart and reports they will plan on dialysis today with discharge following session.   Final Clinical Impressions(s) / ED Diagnoses   Final diagnoses:  Right flank pain  Muscular pain  Hypervolemia, unspecified hypervolemia type    ED Discharge Orders         Ordered    cyclobenzaprine (FLEXERIL) 10 MG tablet  2 times daily PRN     04/25/18 1546           Gareth Morgan, MD 04/25/18 2012

## 2018-04-25 NOTE — Consult Note (Signed)
Reason for Consult: To manage dialysis and dialysis related needs Referring Physician: Dr. Ronald Pippins is an 68 y.o. female.  HPI: 67yo woman with ESRD, HTN, HL who presented to the ED for flank pain today.  She presented for the same on 11/1 and given labs and no outpatient dialysis unit (has been dismissed, has been ~4-6 weeks since last dialysis treatment) it was recommended she undergo HD treatment but she signed out AMA prior to that occurring.  She had CT 11/1 with some suggestion of hydronephrosis and a renal US and urology consult is being considered today.  Nephrology is consulted by the ED for dialysis.   Patient notes generalized edema, not really dysgeusia, + pruritis, + hiccups.   She is agreeable to dialysis.  She has a RIJ TDC and she notes no erythema or pain.   She notes that 'urgent care' is her PCP.  She says she is not on a diuretic.   Past Medical History:  Diagnosis Date  . Allergy   . Anemia   . Anemia in CKD (chronic kidney disease)   . Depression   . Diabetes mellitus without complication ([place])   . Hyperlipidemia   . Hypertension     Past Surgical History:  Procedure Laterality Date  . AV FISTULA PLACEMENT Left 12/23/2016   Procedure: LEFT UPPER EXTREMITY ARTERIOVENOUS (AV) FISTULA CREATION;  Surgeon: Elam Dutch, MD;  Location: [facility];  Service: Vascular;  Laterality: Left;  . INSERTION OF DIALYSIS CATHETER Right 12/23/2016   Procedure: INSERTION OF DIALYSIS CATHETER;  Surgeon: Elam Dutch, MD;  Location: [facility];  Service: Vascular;  Laterality: Right;  . TUBAL LIGATION      Family History  Problem Relation Age of Onset  . Hypertension Mother   . Diabetes Mother   . Hypertension Maternal Grandmother   . Diabetes Maternal Grandmother     Social History:  reports that she has never smoked. She has never used smokeless tobacco. She reports that she does not drink alcohol or use drugs.  Allergies:  Allergies  Allergen Reactions   . No Known Allergies     Medications: I have reviewed the patient's current medications.  Results for orders placed or performed during the hospital encounter of 04/25/18 (from the past 48 hour(s))  CBC     Status: Abnormal   Collection Time: 04/25/18 12:15 PM  Result Value Ref Range   WBC 5.2 4.0 - 10.5 K/uL   RBC 4.17 3.87 - 5.11 MIL/uL   Hemoglobin 9.4 (L) 12.0 - 15.0 g/dL   HCT 32.4 (L) 36.0 - 46.0 %   MCV 77.7 (L) 80.0 - 100.0 fL   MCH 22.5 (L) 26.0 - 34.0 pg   MCHC 29.0 (L) 30.0 - 36.0 g/dL   RDW 16.4 (H) 11.5 - 15.5 %   Platelets 140 (L) 150 - 400 K/uL   nRBC 0.0 0.0 - 0.2 %    Comment: Performed at [facility], [address] [place], [place] [address]  Comprehensive metabolic panel     Status: Abnormal   Collection Time: 04/25/18 12:15 PM  Result Value Ref Range   Sodium 138 135 - 145 mmol/L   Potassium 4.2 3.5 - 5.1 mmol/L   Chloride 111 98 - 111 mmol/L   CO2 16 (L) 22 - 32 mmol/L   Glucose, Bld 142 (H) 70 - 99 mg/dL   BUN 83 (H) 8 - 23 mg/dL   Creatinine, Ser 9.35 (H) 0.44 -  1.00 mg/dL   Calcium 10.5 (H) 8.9 - 10.3 mg/dL   Total Protein 6.8 6.5 - 8.1 g/dL   Albumin 3.8 3.5 - 5.0 g/dL   AST 26 15 - 41 U/L   ALT 47 (H) 0 - 44 U/L   Alkaline Phosphatase 46 38 - 126 U/L   Total Bilirubin 1.1 0.3 - 1.2 mg/dL   GFR calc non Af Amer 4 (L) >60 mL/min   GFR calc Af Amer 4 (L) >60 mL/min    Comment: (NOTE) The eGFR has been calculated using the CKD EPI equation. This calculation has not been validated in all clinical situations. eGFR's persistently <60 mL/min signify possible Chronic Kidney Disease.    Anion gap 11 5 - 15    Comment: Performed at [facility] [address] [place], [place] [address]  Urinalysis, Routine w reflex microscopic     Status: Abnormal   Collection Time: 04/25/18  6:58 PM  Result Value Ref Range   Color, Urine STRAW (A) YELLOW   APPearance CLEAR CLEAR   Specific Gravity, Urine 1.009 1.005 - 1.030   pH 5.0 5.0 - 8.0    Glucose, UA NEGATIVE NEGATIVE mg/dL   Hgb urine dipstick SMALL (A) NEGATIVE   Bilirubin Urine NEGATIVE NEGATIVE   Ketones, ur NEGATIVE NEGATIVE mg/dL   Protein, ur 100 (A) NEGATIVE mg/dL   Nitrite NEGATIVE NEGATIVE   Leukocytes, UA NEGATIVE NEGATIVE   RBC / HPF 0-5 0 - 5 RBC/hpf   WBC, UA 6-10 0 - 5 WBC/hpf   Bacteria, UA RARE (A) NONE SEEN   Squamous Epithelial / LPF 0-5 0 - 5    Comment: Performed at [facility], [address] [place], [place] [address]    US Renal  Result Date: 04/25/2018 CLINICAL DATA:  Right flank pain. EXAM: RENAL / URINARY TRACT ULTRASOUND COMPLETE COMPARISON:  CT scan 04/20/2018 FINDINGS: Right Kidney: Renal measurements: 11.1 x 4.0 x 5.7 cm = volume: 133 cubic cm. ML. Normal renal cortical thickness. Mild increased echogenicity. No worrisome renal lesions or hydronephrosis. Left Kidney: Renal measurements: 10.8 x 5.8 x 6.7 cm = volume: 221.2 cubic cm mL. Mild increased echogenicity but normal renal cortical thickness and no worrisome lesions or hydronephrosis. Bladder: Appears normal for degree of bladder distention. IMPRESSION: 1. Normal renal cortical thickness and no worrisome renal lesions or hydronephrosis. 2. Mild increased echogenicity could suggest medical renal disease. 3. Normal bladder. Electronically Signed   By: Marijo Sanes M.D.   On: 04/25/2018 15:06    ROS: 12 system ROS neg except per HPI.   Blood pressure (!) 153/62, pulse 62, temperature 98.5 F (36.9 C), temperature source Oral, resp. rate (!) 25, weight 99.8 kg, SpO2 97 %. Gen: lying at 45 degrees on gurney in no distress Eyes: anicteric ENT: MMM CV: no rub Lungs: rales in bases Extr: 1+ pitting edema Skin: RIJ TDC dressing c/d/i Psych: resistant to answering detailed questions  Assessment/Plan:  -- ESRD: has been formally discharged from hemodialysis unit and now presents for dialysis via ED.  Dialysis has been ordered - not a traditional full treatment given risk for  dysequilibrium as it's been so long since her last treatment.   She has generalized edema and some uremic symptoms today.   -- Anemia:  Hb has been in the mid 9-10s.  No specific therapy today.   --Ok to discharge patient from nephrology perspective.   Justin Mend 04/25/2018, 9:16 PM

## 2018-04-25 NOTE — ED Triage Notes (Signed)
PT reports she has not dialyzed in approximately 3 weeks. PT was seen here 11/1, but signed out AMA. PT reports back pain, right side pain, and weakness. Denies SOB and chest discomfort.

## 2018-04-26 DIAGNOSIS — I120 Hypertensive chronic kidney disease with stage 5 chronic kidney disease or end stage renal disease: Secondary | ICD-10-CM | POA: Diagnosis not present

## 2018-04-26 MED ORDER — DARBEPOETIN ALFA 60 MCG/0.3ML IJ SOSY
PREFILLED_SYRINGE | INTRAMUSCULAR | Status: AC
  Administered 2018-04-26: 60 ug via INTRAVENOUS
  Filled 2018-04-26: qty 0.3

## 2018-04-26 MED ORDER — HEPARIN SODIUM (PORCINE) 1000 UNIT/ML IJ SOLN
INTRAMUSCULAR | Status: AC
  Administered 2018-04-26: 1000 [IU] via INTRAVENOUS_CENTRAL
  Filled 2018-04-26: qty 4

## 2018-04-26 NOTE — Progress Notes (Signed)
HD tx initiated via HD cath w/o problem Bilat ports: pull/push/flush equally w/o problem VSS Will continue to monitor while on HD tx 

## 2018-04-26 NOTE — Progress Notes (Signed)
HD tx completed @ 0140 w/o problem UF goal met Blood rinsed back VSS PIV removed Pt d/c from unit to go home via w/c w/ transporter to ride waiting for her at ED entrance @ 0210

## 2018-05-08 ENCOUNTER — Ambulatory Visit: Payer: Medicare Other | Admitting: Vascular Surgery

## 2018-05-09 ENCOUNTER — Encounter: Payer: Self-pay | Admitting: Vascular Surgery

## 2019-03-08 ENCOUNTER — Encounter (HOSPITAL_COMMUNITY): Payer: Self-pay | Admitting: Emergency Medicine

## 2019-03-08 ENCOUNTER — Emergency Department (HOSPITAL_COMMUNITY): Payer: Medicare Other

## 2019-03-08 ENCOUNTER — Other Ambulatory Visit: Payer: Self-pay

## 2019-03-08 ENCOUNTER — Inpatient Hospital Stay (HOSPITAL_COMMUNITY)
Admission: EM | Admit: 2019-03-08 | Discharge: 2019-04-02 | DRG: 698 | Disposition: A | Discharge status: Hospice | Payer: Medicare Other | Attending: Internal Medicine | Admitting: Internal Medicine

## 2019-03-08 DIAGNOSIS — I120 Hypertensive chronic kidney disease with stage 5 chronic kidney disease or end stage renal disease: Secondary | ICD-10-CM | POA: Diagnosis not present

## 2019-03-08 DIAGNOSIS — R402142 Coma scale, eyes open, spontaneous, at arrival to emergency department: Secondary | ICD-10-CM | POA: Diagnosis present

## 2019-03-08 DIAGNOSIS — I63543 Cerebral infarction due to unspecified occlusion or stenosis of bilateral cerebellar arteries: Secondary | ICD-10-CM | POA: Diagnosis not present

## 2019-03-08 DIAGNOSIS — I639 Cerebral infarction, unspecified: Secondary | ICD-10-CM

## 2019-03-08 DIAGNOSIS — R4701 Aphasia: Secondary | ICD-10-CM | POA: Diagnosis not present

## 2019-03-08 DIAGNOSIS — Z79899 Other long term (current) drug therapy: Secondary | ICD-10-CM

## 2019-03-08 DIAGNOSIS — G311 Senile degeneration of brain, not elsewhere classified: Secondary | ICD-10-CM | POA: Diagnosis present

## 2019-03-08 DIAGNOSIS — Z833 Family history of diabetes mellitus: Secondary | ICD-10-CM

## 2019-03-08 DIAGNOSIS — Z8249 Family history of ischemic heart disease and other diseases of the circulatory system: Secondary | ICD-10-CM

## 2019-03-08 DIAGNOSIS — I6381 Other cerebral infarction due to occlusion or stenosis of small artery: Secondary | ICD-10-CM | POA: Diagnosis not present

## 2019-03-08 DIAGNOSIS — R000 Tachycardia, unspecified: Secondary | ICD-10-CM | POA: Diagnosis not present

## 2019-03-08 DIAGNOSIS — E8770 Fluid overload, unspecified: Secondary | ICD-10-CM | POA: Diagnosis present

## 2019-03-08 DIAGNOSIS — I6782 Cerebral ischemia: Secondary | ICD-10-CM | POA: Diagnosis present

## 2019-03-08 DIAGNOSIS — L97919 Non-pressure chronic ulcer of unspecified part of right lower leg with unspecified severity: Secondary | ICD-10-CM | POA: Diagnosis not present

## 2019-03-08 DIAGNOSIS — L03116 Cellulitis of left lower limb: Secondary | ICD-10-CM | POA: Diagnosis not present

## 2019-03-08 DIAGNOSIS — D72829 Elevated white blood cell count, unspecified: Secondary | ICD-10-CM | POA: Diagnosis not present

## 2019-03-08 DIAGNOSIS — G8321 Monoplegia of upper limb affecting right dominant side: Secondary | ICD-10-CM | POA: Diagnosis not present

## 2019-03-08 DIAGNOSIS — L97909 Non-pressure chronic ulcer of unspecified part of unspecified lower leg with unspecified severity: Secondary | ICD-10-CM

## 2019-03-08 DIAGNOSIS — I1311 Hypertensive heart and chronic kidney disease without heart failure, with stage 5 chronic kidney disease, or end stage renal disease: Secondary | ICD-10-CM | POA: Diagnosis present

## 2019-03-08 DIAGNOSIS — G936 Cerebral edema: Secondary | ICD-10-CM | POA: Diagnosis not present

## 2019-03-08 DIAGNOSIS — R410 Disorientation, unspecified: Secondary | ICD-10-CM | POA: Diagnosis not present

## 2019-03-08 DIAGNOSIS — R011 Cardiac murmur, unspecified: Secondary | ICD-10-CM | POA: Diagnosis not present

## 2019-03-08 DIAGNOSIS — N2581 Secondary hyperparathyroidism of renal origin: Secondary | ICD-10-CM | POA: Diagnosis present

## 2019-03-08 DIAGNOSIS — R1312 Dysphagia, oropharyngeal phase: Secondary | ICD-10-CM | POA: Diagnosis not present

## 2019-03-08 DIAGNOSIS — I081 Rheumatic disorders of both mitral and tricuspid valves: Secondary | ICD-10-CM | POA: Diagnosis present

## 2019-03-08 DIAGNOSIS — E11622 Type 2 diabetes mellitus with other skin ulcer: Secondary | ICD-10-CM | POA: Diagnosis not present

## 2019-03-08 DIAGNOSIS — N179 Acute kidney failure, unspecified: Secondary | ICD-10-CM | POA: Diagnosis not present

## 2019-03-08 DIAGNOSIS — Z931 Gastrostomy status: Secondary | ICD-10-CM | POA: Diagnosis not present

## 2019-03-08 DIAGNOSIS — Z9115 Patient's noncompliance with renal dialysis: Secondary | ICD-10-CM

## 2019-03-08 DIAGNOSIS — R05 Cough: Secondary | ICD-10-CM

## 2019-03-08 DIAGNOSIS — G9340 Encephalopathy, unspecified: Secondary | ICD-10-CM | POA: Diagnosis not present

## 2019-03-08 DIAGNOSIS — E1122 Type 2 diabetes mellitus with diabetic chronic kidney disease: Secondary | ICD-10-CM | POA: Diagnosis present

## 2019-03-08 DIAGNOSIS — T50916A Underdosing of multiple unspecified drugs, medicaments and biological substances, initial encounter: Secondary | ICD-10-CM | POA: Diagnosis present

## 2019-03-08 DIAGNOSIS — I160 Hypertensive urgency: Secondary | ICD-10-CM | POA: Diagnosis present

## 2019-03-08 DIAGNOSIS — E875 Hyperkalemia: Secondary | ICD-10-CM | POA: Diagnosis present

## 2019-03-08 DIAGNOSIS — Z91128 Patient's intentional underdosing of medication regimen for other reason: Secondary | ICD-10-CM

## 2019-03-08 DIAGNOSIS — R059 Cough, unspecified: Secondary | ICD-10-CM

## 2019-03-08 DIAGNOSIS — E8779 Other fluid overload: Secondary | ICD-10-CM | POA: Diagnosis not present

## 2019-03-08 DIAGNOSIS — Z992 Dependence on renal dialysis: Secondary | ICD-10-CM | POA: Diagnosis not present

## 2019-03-08 DIAGNOSIS — I872 Venous insufficiency (chronic) (peripheral): Secondary | ICD-10-CM | POA: Diagnosis present

## 2019-03-08 DIAGNOSIS — Z431 Encounter for attention to gastrostomy: Secondary | ICD-10-CM

## 2019-03-08 DIAGNOSIS — R1310 Dysphagia, unspecified: Secondary | ICD-10-CM | POA: Diagnosis not present

## 2019-03-08 DIAGNOSIS — I071 Rheumatic tricuspid insufficiency: Secondary | ICD-10-CM | POA: Diagnosis not present

## 2019-03-08 DIAGNOSIS — L03115 Cellulitis of right lower limb: Secondary | ICD-10-CM | POA: Diagnosis not present

## 2019-03-08 DIAGNOSIS — N186 End stage renal disease: Secondary | ICD-10-CM | POA: Diagnosis present

## 2019-03-08 DIAGNOSIS — L853 Xerosis cutis: Secondary | ICD-10-CM

## 2019-03-08 DIAGNOSIS — D509 Iron deficiency anemia, unspecified: Secondary | ICD-10-CM | POA: Diagnosis not present

## 2019-03-08 DIAGNOSIS — Z515 Encounter for palliative care: Secondary | ICD-10-CM

## 2019-03-08 DIAGNOSIS — E118 Type 2 diabetes mellitus with unspecified complications: Secondary | ICD-10-CM | POA: Diagnosis present

## 2019-03-08 DIAGNOSIS — R4182 Altered mental status, unspecified: Secondary | ICD-10-CM | POA: Diagnosis not present

## 2019-03-08 DIAGNOSIS — R402362 Coma scale, best motor response, obeys commands, at arrival to emergency department: Secondary | ICD-10-CM | POA: Diagnosis present

## 2019-03-08 DIAGNOSIS — I890 Lymphedema, not elsewhere classified: Secondary | ICD-10-CM | POA: Diagnosis present

## 2019-03-08 DIAGNOSIS — Z20828 Contact with and (suspected) exposure to other viral communicable diseases: Secondary | ICD-10-CM | POA: Diagnosis present

## 2019-03-08 DIAGNOSIS — D631 Anemia in chronic kidney disease: Secondary | ICD-10-CM | POA: Diagnosis present

## 2019-03-08 DIAGNOSIS — Z7189 Other specified counseling: Secondary | ICD-10-CM

## 2019-03-08 DIAGNOSIS — E785 Hyperlipidemia, unspecified: Secondary | ICD-10-CM | POA: Diagnosis present

## 2019-03-08 DIAGNOSIS — E669 Obesity, unspecified: Secondary | ICD-10-CM | POA: Diagnosis present

## 2019-03-08 DIAGNOSIS — R490 Dysphonia: Secondary | ICD-10-CM | POA: Diagnosis not present

## 2019-03-08 DIAGNOSIS — I361 Nonrheumatic tricuspid (valve) insufficiency: Secondary | ICD-10-CM | POA: Diagnosis not present

## 2019-03-08 DIAGNOSIS — D638 Anemia in other chronic diseases classified elsewhere: Secondary | ICD-10-CM | POA: Diagnosis present

## 2019-03-08 DIAGNOSIS — G9341 Metabolic encephalopathy: Secondary | ICD-10-CM

## 2019-03-08 DIAGNOSIS — Z66 Do not resuscitate: Secondary | ICD-10-CM | POA: Diagnosis not present

## 2019-03-08 DIAGNOSIS — X58XXXA Exposure to other specified factors, initial encounter: Secondary | ICD-10-CM | POA: Diagnosis not present

## 2019-03-08 DIAGNOSIS — E11628 Type 2 diabetes mellitus with other skin complications: Secondary | ICD-10-CM | POA: Diagnosis not present

## 2019-03-08 DIAGNOSIS — R531 Weakness: Secondary | ICD-10-CM | POA: Diagnosis present

## 2019-03-08 DIAGNOSIS — Z5329 Procedure and treatment not carried out because of patient's decision for other reasons: Secondary | ICD-10-CM | POA: Diagnosis not present

## 2019-03-08 DIAGNOSIS — L03119 Cellulitis of unspecified part of limb: Secondary | ICD-10-CM

## 2019-03-08 DIAGNOSIS — M8580 Other specified disorders of bone density and structure, unspecified site: Secondary | ICD-10-CM | POA: Diagnosis present

## 2019-03-08 DIAGNOSIS — I340 Nonrheumatic mitral (valve) insufficiency: Secondary | ICD-10-CM | POA: Diagnosis not present

## 2019-03-08 DIAGNOSIS — Z9114 Patient's other noncompliance with medication regimen: Secondary | ICD-10-CM | POA: Diagnosis not present

## 2019-03-08 DIAGNOSIS — S81801A Unspecified open wound, right lower leg, initial encounter: Secondary | ICD-10-CM | POA: Diagnosis not present

## 2019-03-08 DIAGNOSIS — Z7982 Long term (current) use of aspirin: Secondary | ICD-10-CM

## 2019-03-08 DIAGNOSIS — R402252 Coma scale, best verbal response, oriented, at arrival to emergency department: Secondary | ICD-10-CM | POA: Diagnosis present

## 2019-03-08 DIAGNOSIS — F329 Major depressive disorder, single episode, unspecified: Secondary | ICD-10-CM | POA: Diagnosis present

## 2019-03-08 DIAGNOSIS — R509 Fever, unspecified: Secondary | ICD-10-CM | POA: Diagnosis not present

## 2019-03-08 DIAGNOSIS — I959 Hypotension, unspecified: Secondary | ICD-10-CM | POA: Diagnosis not present

## 2019-03-08 DIAGNOSIS — I63523 Cerebral infarction due to unspecified occlusion or stenosis of bilateral anterior cerebral arteries: Secondary | ICD-10-CM | POA: Diagnosis not present

## 2019-03-08 DIAGNOSIS — Z9889 Other specified postprocedural states: Secondary | ICD-10-CM | POA: Diagnosis not present

## 2019-03-08 DIAGNOSIS — Z6834 Body mass index (BMI) 34.0-34.9, adult: Secondary | ICD-10-CM

## 2019-03-08 DIAGNOSIS — K5900 Constipation, unspecified: Secondary | ICD-10-CM | POA: Diagnosis not present

## 2019-03-08 DIAGNOSIS — Z794 Long term (current) use of insulin: Secondary | ICD-10-CM

## 2019-03-08 DIAGNOSIS — R0902 Hypoxemia: Secondary | ICD-10-CM

## 2019-03-08 DIAGNOSIS — S81802A Unspecified open wound, left lower leg, initial encounter: Secondary | ICD-10-CM | POA: Diagnosis not present

## 2019-03-08 DIAGNOSIS — L97929 Non-pressure chronic ulcer of unspecified part of left lower leg with unspecified severity: Secondary | ICD-10-CM | POA: Diagnosis not present

## 2019-03-08 LAB — BASIC METABOLIC PANEL
Anion gap: 19 — ABNORMAL HIGH (ref 5–15)
BUN: 93 mg/dL — ABNORMAL HIGH (ref 8–23)
CO2: 9 mmol/L — ABNORMAL LOW (ref 22–32)
Calcium: 8.2 mg/dL — ABNORMAL LOW (ref 8.9–10.3)
Chloride: 109 mmol/L (ref 98–111)
Creatinine, Ser: 12.96 mg/dL — ABNORMAL HIGH (ref 0.44–1.00)
GFR calc Af Amer: 3 mL/min — ABNORMAL LOW (ref >60)
GFR calc non Af Amer: 3 mL/min — ABNORMAL LOW (ref >60)
Glucose, Bld: 144 mg/dL — ABNORMAL HIGH (ref 70–99)
Potassium: 5.5 mmol/L — ABNORMAL HIGH (ref 3.5–5.1)
Sodium: 137 mmol/L (ref 135–145)

## 2019-03-08 LAB — CBG MONITORING, ED
Glucose-Capillary: 128 mg/dL — ABNORMAL HIGH (ref 70–99)
Glucose-Capillary: 101 mg/dL — ABNORMAL HIGH (ref 70–99)
Glucose-Capillary: 100 mg/dL — ABNORMAL HIGH (ref 70–99)

## 2019-03-08 LAB — SARS CORONAVIRUS 2 (TAT 6-24 HRS): SARS Coronavirus 2: NEGATIVE

## 2019-03-08 LAB — GLUCOSE, CAPILLARY: Glucose-Capillary: 130 mg/dL — ABNORMAL HIGH (ref 70–99)

## 2019-03-08 LAB — TROPONIN I (HIGH SENSITIVITY)
Troponin I (High Sensitivity): 69 ng/L — ABNORMAL HIGH (ref <18)
Troponin I (High Sensitivity): 73 ng/L — ABNORMAL HIGH (ref <18)

## 2019-03-08 LAB — CBC
HCT: 27.8 % — ABNORMAL LOW (ref 36.0–46.0)
Hemoglobin: 8.7 g/dL — ABNORMAL LOW (ref 12.0–15.0)
MCH: 23.3 pg — ABNORMAL LOW (ref 26.0–34.0)
MCHC: 31.3 g/dL (ref 30.0–36.0)
MCV: 74.5 fL — ABNORMAL LOW (ref 80.0–100.0)
Platelets: 192 10*3/uL (ref 150–400)
RBC: 3.73 MIL/uL — ABNORMAL LOW (ref 3.87–5.11)
RDW: 17 % — ABNORMAL HIGH (ref 11.5–15.5)
WBC: 7.5 10*3/uL (ref 4.0–10.5)
nRBC: 0 % (ref 0.0–0.2)

## 2019-03-08 MED ORDER — INSULIN DETEMIR 100 UNIT/ML ~~LOC~~ SOLN: 7.0000 [IU] | Freq: Every day | SUBCUTANEOUS | Status: DC

## 2019-03-08 MED ORDER — ASPIRIN 81 MG PO CHEW
81.0000 mg | CHEWABLE_TABLET | Freq: Every day | ORAL | Status: DC
  Administered 2019-03-08 – 2019-03-16 (×8): 81 mg via ORAL
  Filled 2019-03-08 (×10): qty 1

## 2019-03-08 MED ORDER — ALTEPLASE 2 MG IJ SOLR
INTRAMUSCULAR | Status: AC
  Filled 2019-03-08: qty 2

## 2019-03-08 MED ORDER — DARBEPOETIN ALFA 200 MCG/0.4ML IJ SOSY
200.0000 ug | PREFILLED_SYRINGE | INTRAMUSCULAR | Status: DC
  Filled 2019-03-08: qty 0.4

## 2019-03-08 MED ORDER — HYDRALAZINE HCL 20 MG/ML IJ SOLN: 10.0000 mg | Freq: Once | INTRAMUSCULAR | Status: DC

## 2019-03-08 MED ORDER — ACETAMINOPHEN 325 MG PO TABS
650.0000 mg | ORAL_TABLET | Freq: Four times a day (QID) | ORAL | Status: DC | PRN
  Administered 2019-03-08 – 2019-03-09 (×2): 650 mg via ORAL
  Filled 2019-03-08: qty 2

## 2019-03-08 MED ORDER — INSULIN ASPART 100 UNIT/ML ~~LOC~~ SOLN
0.0000 [IU] | Freq: Three times a day (TID) | SUBCUTANEOUS | Status: DC
  Administered 2019-03-10 – 2019-03-24 (×6): 1 [IU] via SUBCUTANEOUS
  Administered 2019-03-25: 18:00:00 2 [IU] via SUBCUTANEOUS
  Administered 2019-03-26: 1 [IU] via SUBCUTANEOUS
  Administered 2019-03-27 (×2): 3 [IU] via SUBCUTANEOUS
  Administered 2019-03-28: 5 [IU] via SUBCUTANEOUS
  Administered 2019-03-28: 3 [IU] via SUBCUTANEOUS

## 2019-03-08 MED ORDER — CHLORHEXIDINE GLUCONATE CLOTH 2 % EX PADS
6.0000 | MEDICATED_PAD | Freq: Every day | CUTANEOUS | Status: DC
  Administered 2019-03-09 – 2019-03-25 (×12): 6 via TOPICAL

## 2019-03-08 MED ORDER — ACETAMINOPHEN 650 MG RE SUPP
650.0000 mg | Freq: Four times a day (QID) | RECTAL | Status: DC | PRN
  Administered 2019-03-13: 04:00:00 650 mg via RECTAL
  Filled 2019-03-08 (×2): qty 1

## 2019-03-08 MED ORDER — HYDROCERIN EX CREA
TOPICAL_CREAM | Freq: Two times a day (BID) | CUTANEOUS | Status: DC
  Administered 2019-03-08 – 2019-03-10 (×5): via TOPICAL
  Administered 2019-03-11: 1 via TOPICAL
  Administered 2019-03-11 – 2019-04-02 (×39): via TOPICAL
  Filled 2019-03-08 (×7): qty 113

## 2019-03-08 MED ORDER — HEPARIN SODIUM (PORCINE) 5000 UNIT/ML IJ SOLN
5000.0000 [IU] | Freq: Three times a day (TID) | INTRAMUSCULAR | Status: AC
  Administered 2019-03-08 – 2019-03-24 (×46): 5000 [IU] via SUBCUTANEOUS
  Filled 2019-03-08 (×44): qty 1

## 2019-03-08 MED ORDER — INSULIN ASPART 100 UNIT/ML ~~LOC~~ SOLN
0.0000 [IU] | Freq: Every day | SUBCUTANEOUS | Status: DC
  Administered 2019-03-27: 2 [IU] via SUBCUTANEOUS

## 2019-03-08 MED ORDER — FUROSEMIDE 10 MG/ML IJ SOLN
80.0000 mg | Freq: Two times a day (BID) | INTRAMUSCULAR | Status: DC
  Administered 2019-03-08: 11:00:00 80 mg via INTRAVENOUS
  Filled 2019-03-08: qty 8

## 2019-03-08 MED ORDER — SODIUM CHLORIDE 0.9% FLUSH: 3.0000 mL | Freq: Once | INTRAVENOUS | Status: DC

## 2019-03-08 MED ORDER — CHLORHEXIDINE GLUCONATE CLOTH 2 % EX PADS: 6.0000 | MEDICATED_PAD | Freq: Every day | CUTANEOUS | Status: DC

## 2019-03-08 MED ORDER — FERROUS SULFATE 325 (65 FE) MG PO TABS
325.0000 mg | ORAL_TABLET | Freq: Every day | ORAL | Status: DC
Start: 2019-03-08 — End: 2019-03-17
  Administered 2019-03-08 – 2019-03-16 (×6): 325 mg via ORAL
  Filled 2019-03-08 (×8): qty 1

## 2019-03-08 MED ORDER — RENA-VITE PO TABS
1.0000 | ORAL_TABLET | Freq: Every day | ORAL | Status: DC
  Administered 2019-03-08 – 2019-04-01 (×21): 1 via ORAL
  Filled 2019-03-08 (×23): qty 1

## 2019-03-08 MED ORDER — HYDRALAZINE HCL 20 MG/ML IJ SOLN: 5.0000 mg | Freq: Four times a day (QID) | INTRAMUSCULAR | Status: DC | PRN

## 2019-03-08 MED ORDER — AMLODIPINE BESYLATE 5 MG PO TABS
10.0000 mg | ORAL_TABLET | Freq: Every day | ORAL | Status: DC
  Administered 2019-03-08: 10 mg via ORAL
  Filled 2019-03-08 (×2): qty 2

## 2019-03-08 MED ORDER — METOPROLOL TARTRATE 25 MG PO TABS: 25.0000 mg | ORAL_TABLET | Freq: Two times a day (BID) | ORAL | Status: DC

## 2019-03-08 MED ORDER — METOPROLOL TARTRATE 25 MG PO TABS
12.5000 mg | ORAL_TABLET | Freq: Once | ORAL | Status: AC
  Administered 2019-03-08: 12.5 mg via ORAL
  Filled 2019-03-08: qty 1

## 2019-03-08 MED ORDER — SODIUM CHLORIDE 0.9% FLUSH
3.0000 mL | Freq: Two times a day (BID) | INTRAVENOUS | Status: DC
  Administered 2019-03-08 – 2019-04-02 (×45): 3 mL via INTRAVENOUS

## 2019-03-08 MED ORDER — ALTEPLASE 2 MG IJ SOLR
INTRAMUSCULAR | Status: AC
  Administered 2019-03-08: 15:00:00 2 mg
  Filled 2019-03-08: qty 2

## 2019-03-08 MED ORDER — HEPARIN SODIUM (PORCINE) 5000 UNIT/ML IJ SOLN
5000.0000 [IU] | Freq: Three times a day (TID) | INTRAMUSCULAR | Status: DC
  Filled 2019-03-08: qty 1

## 2019-03-08 MED ORDER — ALTEPLASE 2 MG IJ SOLR
2.0000 mg | Freq: Once | INTRAMUSCULAR | Status: AC
  Administered 2019-03-08: 15:00:00 2 mg
  Filled 2019-03-08: qty 4

## 2019-03-08 NOTE — ED Notes (Signed)
Pt sent back from dialysis and will go back later  Pt alert but drowsy  C/o being cold blankets and thermostat adjusted

## 2019-03-08 NOTE — ED Notes (Signed)
Nephrology at bedside

## 2019-03-08 NOTE — ED Triage Notes (Signed)
Pt BIB [facility], c/o shortness of breath with exertion, lower back pain, BLE swelling and generalized weakness. Last dialysis treatment x 1 month ago, non-compliant with medications. EMS VS: BP 220/100, HR 100, SpO2 100% room air.

## 2019-03-08 NOTE — Progress Notes (Signed)
Pt.;s HD catheter does not push or pull and Nephrologist ordered to instill cathflo and will attempt to use it again tonight.

## 2019-03-08 NOTE — ED Notes (Signed)
Bilateral loer ext swelling and weeping her skin is extremely dry and scaly patient states she is M-W-F states she hasn't had dialysis in 1 month, cath in right anterior chest is what they use for dialysis cath is very dirty with no dressing upon arrival. States she isn't able to go to the dialysis center any longer because she has missed to many appointments

## 2019-03-08 NOTE — H&P (Signed)
Date: 03/08/2019               Patient Name:  Phyllis Hernandez MRN: [id number]  DOB: [birth year] Age / Sex: 68 y.o., female   PCP: Patient, No Pcp Per         Medical Service: Internal Medicine Teaching Service         Attending Physician: Dr. Rebeca Alert, Raynaldo Opitz, MD    First Contact: Dr. Gilford Rile Pager: [phone]  Second Contact: Dr. Sherry Ruffing  Pager: [phone]       After Hours (After 5p/  First Contact Pager: [phone]  weekends / holidays): Second Contact Pager: [phone]   Chief Complaint: Fatigue and shortness of breath   History of Present Illness:  Phyllis Hernandez is a 68 yo female with a medical history of ESRD non-compliant with HD, HTN, HLD, T2DM and constipation who presented to the ED for evaluation of shortness of breath and fatigue. She started HD about 1 year ago and has been having a difficult time with it. She complains of severe GI symptoms and constant sleepiness which leads to missing HD sessions.  She does not recall when was her last HD session, states it was months ago.  Denies issues with transportation, but does not elaborate as to why she continues to miss HD. This morning, she had difficulty getting in her car due to shortness of breath and decided to come to the ED.  She continues to make urine, "a lot" especially at night. She denies recent illness, chest pain, fevers, chills, abdominal pain, N/V, urinary symptoms and changes in bowel movements.  Reports a good appetite daily bowel movements.  She does not have a PCP and has not taken any medications for months.  ED course: Patient was afebrile, tachycardic to 110, and hypertensive 211/97 on arrival to the ED.  Blodoowrk remarkable for Hgb 8.7 (BL 9-10), K 5.5, bicarb 9, BUN 93, CR 12.96.  COVID pending.  Chest x-ray did show enlarged cardiac silhouette and chronic vascular congestion.  She did not receive any medications in the ED.  Nephrology was consulted in the ED.  Meds: Patient does not take any  medications at home.   Allergies: Allergies as of 03/08/2019 - Review Complete 03/08/2019  Allergen Reaction Noted   No known allergies  12/22/2016   Past Medical History:  Diagnosis Date   Allergy    Anemia    Anemia in CKD (chronic kidney disease)    Depression    Diabetes mellitus without complication ([place])    Hyperlipidemia    Hypertension     Family History:  Family History  Problem Relation Age of Onset   Hypertension Mother    Diabetes Mother    Hypertension Maternal Grandmother    Diabetes Maternal Grandmother      Social History: Patient lives alone at home in [place].  Had a son to use to support her at home but he moved and they are not in contact anymore.  She is able to drive herself to appointments and HD, and completes ADLs by self.  Ambulates at baseline.  She denies tobacco and alcohol use in years.  She also denies illicit drug use.  Review of Systems: A complete ROS was negative except as per HPI.   Physical Exam: Blood pressure (!) 201/95, pulse 91, temperature 98.4 F (36.9 C), temperature source Oral, resp. rate (!) 24, SpO2 100 %.  General: Chronically ill-appearing female, appears fatigued, no acute distress HENT: NCAT, neck  supple and FROM, OP clear without exudates or erythema, poor dentition  Eyes: anicteric sclera, PERRL Chest: TDC on R chest is very dirty, area is not tender and without signs of infection   Cardiac: regular rate and rhythm, nl S1/S2, no murmurs, rubs or gallops  Pulm: CTAB, no wheezes or crackles, no increased work of breathing on RA, unable to speak in full sentences, no accessory muscle use  Abd: Abdomen is obese, soft, NTND, bowel sounds are normoactive Neuro: A&Ox3, diffusely weak but no focal deficits noted Ext: warm and well perfused, 2+ edema up to abdomen  Derm: hyperpigmented papules on bilateral UE R>L, dry scaly skin on bilateral LE with desquamation and open wounds on bilateral shins without signs  of infection    EKG: personally reviewed my interpretation is NSR, left axis deviation, no acute signs of ischemia  CXR: personally reviewed my interpretation is enlarged cardiac silhouette with vascular congestion  Assessment & Plan by Problem: Active Problems:   Hypertensive urgency   # Hypertensive urgency: Patient presented with a blood pressure of 211/97 after being off of all her blood pressure medications at least for 2 months and missing dialysis for months as well.  She denies chest pain at this time monitor EKG appears nonischemic. Will start anti-hypertensive therapy with goal to decrease MAP by 25% in the next 24 hours.  - Metoprolol 12.5 mg x1 followed by metoprolol tartrate 25 mg BID  - Start amlodipine 10 mg QD - IV hydralazine 5 mg q6h PRN for sBP > 180 - Suspect she will receive HD today which should help with BP as well  - Follow up troponin   # Volume overload: This is secondary to missing dialysis for months.  He does not have a history of heart failure.  Last echocardiogram in 2018 showed EF 60%, mild LVH, no regional wall motion abnormalities, and normal diastolic function parameters.  No evidence of valve stenosis or regurgitation. - Nephrology consulted in the ED, appreciate recommendations - IV Lasix 80 mg BID as patient continues to make urine   # Bilateral LE wounds:  - WOC consult  - Eucerin cream for dry skin   # T2DM: Off of diabetic regimen. Does not check BG at home. Previously on insulin. Curent CBG 124. Will hold off on long-acting insulin for now.  - SSI-S with CBG monitoring   # HLD: Continue home Zetia.    Diet: Renal/CM  VTE ppx: heparin   Code status: FULL CODE, confirmed on admission    Dispo: Admit patient to Inpatient with expected length of stay greater than 2 midnights.  SignedWelford Roche, MD 03/08/2019, 10:18 AM  Pager: [phone]

## 2019-03-08 NOTE — Progress Notes (Signed)
Pt transferred to 4E-19 via stretcher from ED. Pt transferred to bed. Pt given CHG bath. Tele applied, CCMD notified. Pt oriented to call bell, room and bed. Call bell within reach. VSS. Will continue to monitor. Amanda Cockayne, RN

## 2019-03-08 NOTE — ED Notes (Signed)
The pt was sent back from dialysis she has not been dialyzed yet

## 2019-03-08 NOTE — ED Provider Notes (Addendum)
[facility] Provider Note   CSN: [id number] Arrival date & time: 03/08/19  [id number]     History   Chief Complaint Chief Complaint  Patient presents with  . Weakness    HPI Phyllis Hernandez is a 68 y.o. female.     This is a 68 year old female who presents with increased weakness times several days.  History of end-stage renal disease and is supposed be on dialysis as well as multiple blood pressure medications but has been noncompliant for a month.  Today she was so weak that she cannot get in her car.  She notes increased dyspnea on exertion.  Denies any chest pain or chest pressure.  No fever or cough.  No recent vomiting or diarrhea.  No treatment use prior to arrival.     Past Medical History:  Diagnosis Date  . Allergy   . Anemia   . Anemia in CKD (chronic kidney disease)   . Depression   . Diabetes mellitus without complication ([place])   . Hyperlipidemia   . Hypertension     Patient Active Problem List   Diagnosis Date Noted  . Closed displaced fracture of lateral malleolus of right fibula 02/09/2017  . ESRD (end stage renal disease) ([place]) 12/27/2016  . Elevated troponin 12/27/2016  . Anemia of chronic disease 12/27/2016  . Thrombocytopenia ([place]) 12/27/2016  . Constipation 12/27/2016  . Hypertensive emergency 12/09/2016  . Acute on chronic renal failure ([place]) 12/09/2016  . Type 2 diabetes mellitus with complication, without long-term current use of insulin ([place]) 02/27/2007  . Hyperlipidemia 02/27/2007  . Essential hypertension 02/27/2007    Past Surgical History:  Procedure Laterality Date  . AV FISTULA PLACEMENT Left 12/23/2016   Procedure: LEFT UPPER EXTREMITY ARTERIOVENOUS (AV) FISTULA CREATION;  Surgeon: Elam Dutch, MD;  Location: [facility];  Service: Vascular;  Laterality: Left;  . INSERTION OF DIALYSIS CATHETER Right 12/23/2016   Procedure: INSERTION OF DIALYSIS CATHETER;  Surgeon: Elam Dutch, MD;  Location:  [facility];  Service: Vascular;  Laterality: Right;  . TUBAL LIGATION       OB History   No obstetric history on file.      Home Medications    Prior to Admission medications   Medication Sig Start Date End Date Taking? Authorizing Provider  aspirin 81 MG chewable tablet Chew 81 mg by mouth daily.    [provider]  benzonatate (TESSALON) 100 MG capsule Take 1 capsule (100 mg total) by mouth every 8 (eight) hours. Patient not taking: Reported on 04/20/2018 10/27/17   Ward, Ozella Almond, PA-C  bisacodyl (DULCOLAX) 10 MG suppository Place 1 suppository (10 mg total) rectally daily as needed for moderate constipation. Patient not taking: Reported on 04/20/2018 12/28/16   Modena Jansky, MD  calcium acetate (PHOSLO) 667 MG capsule Take 2 capsules (1,334 mg total) by mouth 3 (three) times daily with meals. 12/28/16   Hongalgi, Lenis Dickinson, MD  cyclobenzaprine (FLEXERIL) 10 MG tablet Take 1 tablet (10 mg total) by mouth 2 (two) times daily as needed for muscle spasms. 04/25/18   Gareth Morgan, MD  ferrous sulfate 325 (65 FE) MG tablet Take 325 mg by mouth daily with breakfast.    [provider]  insulin aspart (NOVOLOG) 100 UNIT/ML FlexPen Use as directed 3 times a day with meals and at bedtime snack Patient taking differently: Inject 3 Units into the skin 3 (three) times daily with meals.  06/01/17   Janne Napoleon, NP  Insulin Detemir (LEVEMIR FLEXTOUCH) 100 UNIT/ML Pen Inject 10 units under the skin at bedtime around 10 PM. This is the flex touch. Patient taking differently: Inject 10 Units into the skin at bedtime.  08/10/17   Vanessa Kick, MD  Insulin Glargine (LANTUS) 100 UNIT/ML Solostar Pen Inject 25 Units into the skin daily at 10 pm. Patient not taking: Reported on 04/20/2018 12/28/16   Modena Jansky, MD  Insulin Pen Needle 29G X 12MM MISC To be used per instructions 3 times a day with meals and at bedtime. 08/10/17   Vanessa Kick, MD  isosorbide-hydrALAZINE (BIDIL)  20-37.5 MG tablet Take 2 tablets by mouth 3 (three) times daily. 12/28/16   Hongalgi, Lenis Dickinson, MD  metoprolol tartrate (LOPRESSOR) 50 MG tablet Take 1 tablet (50 mg total) by mouth 2 (two) times daily. 08/10/17   Vanessa Kick, MD  multivitamin (RENA-VIT) TABS tablet Take 1 tablet by mouth at bedtime. 12/28/16   Hongalgi, Lenis Dickinson, MD  oxyCODONE-acetaminophen (PERCOCET/ROXICET) 5-325 MG tablet Take 2 tablets by mouth every 4 (four) hours as needed for severe pain. Patient not taking: Reported on 04/20/2018 01/13/17   Recardo Evangelist, PA-C  polyethylene glycol [facility] / Floria Raveling) packet Take 17 g by mouth 2 (two) times daily. Patient not taking: Reported on 04/20/2018 12/28/16   Modena Jansky, MD  senna-docusate (SENOKOT-S) 8.6-50 MG tablet Take 1 tablet by mouth 2 (two) times daily. 12/28/16   Hongalgi, Lenis Dickinson, MD    Family History Family History  Problem Relation Age of Onset  . Hypertension Mother   . Diabetes Mother   . Hypertension Maternal Grandmother   . Diabetes Maternal Grandmother     Social History Social History   Tobacco Use  . Smoking status: Never Smoker  . Smokeless tobacco: Never Used  Substance Use Topics  . Alcohol use: No    Frequency: Never  . Drug use: No     Allergies   No known allergies   Review of Systems Review of Systems  All other systems reviewed and are negative.    Physical Exam Updated Vital Signs BP (!) 219/91 (BP Location: Right Arm)   Pulse 91   Temp 98.4 F (36.9 C) (Oral)   Resp 18   SpO2 100%   Physical Exam Vitals signs and nursing note reviewed.  Constitutional:      General: She is not in acute distress.    Appearance: Normal appearance. She is well-developed. She is not toxic-appearing.  HENT:     Head: Normocephalic and atraumatic.  Eyes:     General: Lids are normal.     Conjunctiva/sclera: Conjunctivae normal.     Pupils: Pupils are equal, round, and reactive to light.  Neck:     Musculoskeletal: Normal range  of motion and neck supple.     Thyroid: No thyroid mass.     Trachea: No tracheal deviation.  Cardiovascular:     Rate and Rhythm: Normal rate and regular rhythm.     Heart sounds: Normal heart sounds. No murmur. No gallop.   Pulmonary:     Effort: Pulmonary effort is normal. No respiratory distress.     Breath sounds: Normal breath sounds. No stridor. No decreased breath sounds, wheezing, rhonchi or rales.  Abdominal:     General: Bowel sounds are normal. There is no distension.     Palpations: Abdomen is soft.     Tenderness: There is no abdominal tenderness. There is no rebound.  Musculoskeletal: Normal range of  motion.        General: No tenderness.     Comments: Dry flaking skin noted at bilateral lower extremities with some foul smell.  No purulence noted.  Lymphadenopathy:     Comments: 3+ bilateral lower extremity pitting edema about the  Skin:    General: Skin is warm and dry.     Findings: No abrasion or rash.  Neurological:     Mental Status: She is alert and oriented to person, place, and time.     GCS: GCS eye subscore is 4. GCS verbal subscore is 5. GCS motor subscore is 6.     Cranial Nerves: No cranial nerve deficit.     Sensory: No sensory deficit.  Psychiatric:        Attention and Perception: Attention normal.        Mood and Affect: Mood is depressed. Affect is flat.      ED Treatments / Results  Labs (all labs ordered are listed, but only abnormal results are displayed) Labs Reviewed  BASIC METABOLIC PANEL - Abnormal; Notable for the following components:      Result Value   Potassium 5.5 (*)    CO2 9 (*)    Glucose, Bld 144 (*)    BUN 93 (*)    Creatinine, Ser 12.96 (*)    Calcium 8.2 (*)    GFR calc non Af Amer 3 (*)    GFR calc Af Amer 3 (*)    Anion gap 19 (*)    All other components within normal limits  CBC - Abnormal; Notable for the following components:   RBC 3.73 (*)    Hemoglobin 8.7 (*)    HCT 27.8 (*)    MCV 74.5 (*)    MCH 23.3  (*)    RDW 17.0 (*)    All other components within normal limits  CBG MONITORING, ED - Abnormal; Notable for the following components:   Glucose-Capillary 128 (*)    All other components within normal limits  SARS CORONAVIRUS 2 (TAT 6-24 HRS)  URINALYSIS, ROUTINE W REFLEX MICROSCOPIC    EKG EKG Interpretation  Date/Time:  Friday March 08 2019 05:38:31 EDT Ventricular Rate:  94 PR Interval:  162 QRS Duration: 78 QT Interval:  380 QTC Calculation: 475 R Axis:   -67 Text Interpretation:  Normal sinus rhythm Left axis deviation Pulmonary disease pattern Abnormal QRS-T angle, consider primary T wave abnormality Abnormal ECG Confirmed by Lacretia Leigh ([id number]) on 03/08/2019 8:03:26 AM   Radiology No results found.  Procedures Procedures (including critical care time)  Medications Ordered in ED Medications  sodium chloride flush (NS) 0.9 % injection 3 mL (has no administration in time range)     Initial Impression / Assessment and Plan / ED Course  I have reviewed the triage vital signs and the nursing notes.  Pertinent labs & imaging results that were available during my care of the patient were reviewed by me and considered in my medical decision making (see chart for details).        Patient is hypertension noted and treated with hydralazine 10 mg IV push. Patient's labs and x-rays reviewed and bicarb is down to 9.  Potassium elevated at 5.5.  No evidence of EKG changes.  Will consult nephrology as well as medicine for admission   CRITICAL CARE Performed by: Leota Jacobsen Total critical care time: 50 minutes Critical care time was exclusive of separately billable procedures and treating other patients. Critical care was  necessary to treat or prevent imminent or life-threatening deterioration. Critical care was time spent personally by me on the following activities: development of treatment plan with patient and/or surrogate as well as nursing, discussions with  consultants, evaluation of patient's response to treatment, examination of patient, obtaining history from patient or surrogate, ordering and performing treatments and interventions, ordering and review of laboratory studies, ordering and review of radiographic studies, pulse oximetry and re-evaluation of patient's condition.   Final Clinical Impressions(s) / ED Diagnoses   Final diagnoses:  None    ED Discharge Orders    None       Lacretia Leigh, MD 03/08/19 [id number]    Lacretia Leigh, MD 03/08/19 [phone]

## 2019-03-08 NOTE — Progress Notes (Signed)
Renal Navigator received call earlier today from Renal NP/R. Owens Shark that patient is here after being discharged from [facility] for non-compliance and wants to return to OP HD treatment. Renal Navigator attempted to meet with patient, but she was leaving ED to go to HD. She was then not in HD when Renal Navigator came to unit because her HD catheter would not work. Renal Navigator chose next closest unit to patient's home address listed in Epic to attempt to get her referred to a new OP HD clinic and will follow up with patient and staff on Monday.  Alphonzo Cruise, [facility] Renal Navigator [phone]

## 2019-03-08 NOTE — ED Notes (Signed)
Report called to [email or web address] on 4e

## 2019-03-08 NOTE — Consult Note (Addendum)
[facility] Nurse wound consult note Reason for Consult: Very dry skin (xerosis) on the lower extremities.  Two areas where skin has been traumatically removed resulting in partial thickness skin loss (no bleeding). No weeping at this time, was previously noted by staff but LEs have been elevated. No other ulcers, lesions on LEs or feet. Wound type: trauma,  Pressure Injury POA: N/A Measurement:  Right lateral LE:  3cm x 0.4cm x 0.1cm red, dry wound bed Left anterior LE:  2.5cm x 2cm x 0.1cm red, dry wound bed Wound bed:As described above Drainage (amount, consistency, odor) None currently. Periwound: very dry skin with large sections ready to flake off. Dressing procedure/placement/frequency: I will implement a POC for hydration of the LEs and feet using Eucerin (not between toes) and covering the two lesions with an ABD pad prior to applying light compression using a dry boot (Kerlix roll gauze wrapped from toes to knee topped with ACE bandages wrapped in a similar manner).  The bilateral feet will be placed into pressure redistribution heel boots (Prevalon) to prevent pressure injury to the heels via flotation and provide mild elevation/protection.  [facility] nursing team will not follow, but will remain available to this patient, the nursing and medical teams.  Please re-consult if needed. Thanks, Maudie Flakes, MSN, RN, [facility], [facility]  Pager# [phone]

## 2019-03-08 NOTE — Consult Note (Addendum)
[facility] Renal Consultation Note    Indication for Consultation:  Management of ESRD/hemodialysis; anemia, hypertension/volume and secondary hyperparathyroidism PCP: No PCP on file.   HPI: Phyllis Hernandez is a 68 y.o. female with ESRD who was started on HD 12/2016 but was noncompliant with HD, never came to treatment and was subsequently ejected from HD clinic. Last documented HD session 02/21/2018. PMH significant for morbid obesity, HTN, DMT2, AOCD, SHPT. She presented to ED today with SOB/hypertensive crisis.   BP on arrival to ED 211/97 HR 110 T-98.9 O2 Sats 100% on RA.  SCr 12.96 EGFR 3 BUN 93 K+ 5.5 Co2 9. HGB 8.7 BS 144. CXR with cardiomegaly and chronic vascular congestion.   She amazingly does not have signs of overt uremia. She says she "gets sick to stomach" during HD treatments and just feels awful. She is accompanied by youngest son who has apparently been trying to get her to attend HD without success. Discussed frankly with patient the implications of missing hemodialysis. Offered the choice of resuming HD or referral to Palliative Care/Hospice. She has agreed to proceed with HD today.    Past Medical History:  Diagnosis Date  . Allergy   . Anemia   . Anemia in CKD (chronic kidney disease)   . Depression   . Diabetes mellitus without complication ([place])   . Hyperlipidemia   . Hypertension    Past Surgical History:  Procedure Laterality Date  . AV FISTULA PLACEMENT Left 12/23/2016   Procedure: LEFT UPPER EXTREMITY ARTERIOVENOUS (AV) FISTULA CREATION;  Surgeon: Elam Dutch, MD;  Location: [facility];  Service: Vascular;  Laterality: Left;  . INSERTION OF DIALYSIS CATHETER Right 12/23/2016   Procedure: INSERTION OF DIALYSIS CATHETER;  Surgeon: Elam Dutch, MD;  Location: [facility];  Service: Vascular;  Laterality: Right;  . TUBAL LIGATION     Family History  Problem Relation Age of Onset  . Hypertension Mother   . Diabetes Mother   . Hypertension  Maternal Grandmother   . Diabetes Maternal Grandmother    Social History:  reports that she has never smoked. She has never used smokeless tobacco. She reports that she does not drink alcohol or use drugs. Allergies  Allergen Reactions  . No Known Allergies    Prior to Admission medications   Medication Sig Start Date End Date Taking? Authorizing Provider  Insulin Pen Needle 29G X 12MM MISC To be used per instructions 3 times a day with meals and at bedtime. 08/10/17   Vanessa Kick, MD   Current Facility-Administered Medications  Medication Dose Route Frequency Provider Last Rate Last Dose  . acetaminophen (TYLENOL) tablet 650 mg  650 mg Oral Q6H PRN Santos-Sanchez, Merlene Morse, MD       Or  . acetaminophen (TYLENOL) suppository 650 mg  650 mg Rectal Q6H PRN Santos-Sanchez, Idalys, MD      . aspirin chewable tablet 81 mg  81 mg Oral Daily Santos-Sanchez, Idalys, MD   81 mg at 03/08/19 1032  . Chlorhexidine Gluconate Cloth 2 % PADS 6 each  6 each Topical Q0600 Valentina Gu, NP   Stopped at 03/08/19 1250  . Darbepoetin Alfa (ARANESP) injection 200 mcg  200 mcg Intravenous Q Fri-HD Valentina Gu, NP      . ferrous sulfate tablet 325 mg  325 mg Oral Q breakfast Welford Roche, MD   325 mg at 03/08/19 1256  . heparin injection 5,000 Units  5,000 Units Subcutaneous Q8H Welford Roche, MD      .  hydrocerin (EUCERIN) cream   Topical BID Santos-Sanchez, Merlene Morse, MD      . insulin aspart (novoLOG) injection 0-5 Units  0-5 Units Subcutaneous QHS Santos-Sanchez, Idalys, MD      . insulin aspart (novoLOG) injection 0-9 Units  0-9 Units Subcutaneous TID WC Santos-Sanchez, Idalys, MD      . multivitamin (RENA-VIT) tablet 1 tablet  1 tablet Oral QHS Santos-Sanchez, Idalys, MD      . sodium chloride flush (NS) 0.9 % injection 3 mL  3 mL Intravenous Q12H Welford Roche, MD       Current Outpatient Medications  Medication Sig Dispense Refill  . Insulin Pen Needle 29G X  12MM MISC To be used per instructions 3 times a day with meals and at bedtime. 200 each 1   Labs: Basic Metabolic Panel: Recent Labs  Lab 03/08/19 0538  NA 137  K 5.5*  CL 109  CO2 9*  GLUCOSE 144*  BUN 93*  CREATININE 12.96*  CALCIUM 8.2*   Liver Function Tests: No results for input(s): AST, ALT, ALKPHOS, BILITOT, PROT, ALBUMIN in the last 168 hours. No results for input(s): LIPASE, AMYLASE in the last 168 hours. No results for input(s): AMMONIA in the last 168 hours. CBC: Recent Labs  Lab 03/08/19 0538  WBC 7.5  HGB 8.7*  HCT 27.8*  MCV 74.5*  PLT 192   Cardiac Enzymes: No results for input(s): CKTOTAL, CKMB, CKMBINDEX, TROPONINI in the last 168 hours. CBG: Recent Labs  Lab 03/08/19 0536 03/08/19 1305  GLUCAP 128* 101*   Iron Studies: No results for input(s): IRON, TIBC, TRANSFERRIN, FERRITIN in the last 72 hours. Studies/Results: Dg Chest Port 1 View  Result Date: 03/08/2019 CLINICAL DATA:  Shortness of breath. EXAM: PORTABLE CHEST 1 VIEW COMPARISON:  Radiographs 03/25/2018 and 10/27/2017. FINDINGS: 0810 hours. The right IJ hemodialysis catheter is unchanged with the tip in the upper right atrium. There is stable cardiomegaly and aortic atherosclerosis. There is stable vascular congestion without overt pulmonary edema, confluent airspace opacity, pleural effusion or pneumothorax. The bones appear unchanged. IMPRESSION: Radiographically stable appearance of the chest with cardiomegaly and chronic vascular congestion. No new findings. Electronically Signed   By: Richardean Sale M.D.   On: 03/08/2019 08:36    ROS: As per HPI otherwise negative.  Physical Exam: Vitals:   03/08/19 1330 03/08/19 1345 03/08/19 1400 03/08/19 1415  BP: 107/89 (!) 114/54 116/62 (!) 139/56  Pulse:    60  Resp: '17 19 20 17  ' Temp:      TempSrc:      SpO2:    98%     General: Chronically ill appearing older female in no acute distress. Head: Normocephalic, atraumatic, sclera  non-icteric, mucus membranes are moist Neck: Supple. JVD 1/2 to mandible Lungs: Few crackles L base, otherwise CTAB Heart: HS distant, S1,S2. RRR.  Abdomen: Obese, active BS, probable ascites present M-S:  Strength and tone appear normal for age. Lower extremities: Woody appearance BLE with open blisters on BLE.1-2+ BLE.   Neuro: Alert and oriented X 3. Moves all extremities spontaneously. Psych:  Responds to questions appropriately with a normal affect. Dialysis Access: RIJ TDC. Catheter soiled even after being cleaned and drsg changed per IV therapy.   Dialysis Orders: No HD center  Assessment/Plan: 1.  Hypertensive Urgency: Per primary. Resume home meds. Resume HD.  2.  Volume overload: Has missed HD for months. Last treatment in [facility] 02/21/2018. CXR with cardiomegaly and vascular congestion. Lower volume as tolerated.  3.  ESRD -  Started HD 12/2016. Has been noncompliant since. Discussed option of resuming HD or going to Hospice. She is opting to resume HD. Will have to proceed as new start to HD with low BFR/DFR to avoid dysequilibrium syndrome. EGFR 3 today but clearly has residual RF or she could not survive without HD this long. HD today and tomorrow. K+ 5.5. 2.0 K bath, tight heparin. Will start CLIP process but unsure if any center will accept her due to past behavior.  4.  Hypertension/volume  - Resume HD today. UF 2 liters. Has not taken any BP meds for long time. Previously on BiDil and metoprolol. Will start with lowering volume and add antihypertensive meds as HD progresses.   5.  Anemia  - HGB 8.7.Give ESA with HD today. Order iron panel.  6.  Metabolic bone disease -  Check phos/PTH. Resume binders/VDRA when labs available. Ca 8.2.  7.  Nutrition - Renal/Carb mod diet.  8. Wounds bilateral LE-Wound consult. Lower volume.  Rita H. Owens Shark, NP-C 03/08/2019, 2:37 PM  [facility] Beeper [phone]  Pt seen, examined and agree w A/P as above. Pt returns approx  1 year from last HD sessions (had several mos HD in 2019) w/ moderate / severe vol overload and probable uremic symptoms, creat 12.  TDC in place, but wouldn't work on initial try today. Instilling TPA and will try again.  O/w agree w above plan.  Kelly Splinter  MD 03/08/2019, 4:00 PM

## 2019-03-08 NOTE — ED Notes (Signed)
The pt has a room assignment  Will call report

## 2019-03-09 LAB — RENAL FUNCTION PANEL
Albumin: 2.4 g/dL — ABNORMAL LOW (ref 3.5–5.0)
Anion gap: 14 (ref 5–15)
BUN: 76 mg/dL — ABNORMAL HIGH (ref 8–23)
CO2: 16 mmol/L — ABNORMAL LOW (ref 22–32)
Calcium: 7.8 mg/dL — ABNORMAL LOW (ref 8.9–10.3)
Chloride: 106 mmol/L (ref 98–111)
Creatinine, Ser: 10.33 mg/dL — ABNORMAL HIGH (ref 0.44–1.00)
GFR calc Af Amer: 4 mL/min — ABNORMAL LOW (ref >60)
GFR calc non Af Amer: 4 mL/min — ABNORMAL LOW (ref >60)
Glucose, Bld: 129 mg/dL — ABNORMAL HIGH (ref 70–99)
Phosphorus: 6.9 mg/dL — ABNORMAL HIGH (ref 2.5–4.6)
Potassium: 4.5 mmol/L (ref 3.5–5.1)
Sodium: 136 mmol/L (ref 135–145)

## 2019-03-09 LAB — CBC
HCT: 24.3 % — ABNORMAL LOW (ref 36.0–46.0)
Hemoglobin: 7.5 g/dL — ABNORMAL LOW (ref 12.0–15.0)
MCH: 23 pg — ABNORMAL LOW (ref 26.0–34.0)
MCHC: 30.9 g/dL (ref 30.0–36.0)
MCV: 74.5 fL — ABNORMAL LOW (ref 80.0–100.0)
Platelets: 163 10*3/uL (ref 150–400)
RBC: 3.26 MIL/uL — ABNORMAL LOW (ref 3.87–5.11)
RDW: 17 % — ABNORMAL HIGH (ref 11.5–15.5)
WBC: 6.5 10*3/uL (ref 4.0–10.5)
nRBC: 0 % (ref 0.0–0.2)

## 2019-03-09 LAB — GLUCOSE, CAPILLARY
Glucose-Capillary: 101 mg/dL — ABNORMAL HIGH (ref 70–99)
Glucose-Capillary: 73 mg/dL (ref 70–99)
Glucose-Capillary: 56 mg/dL — ABNORMAL LOW (ref 70–99)
Glucose-Capillary: 61 mg/dL — ABNORMAL LOW (ref 70–99)
Glucose-Capillary: 132 mg/dL — ABNORMAL HIGH (ref 70–99)
Glucose-Capillary: 58 mg/dL — ABNORMAL LOW (ref 70–99)
Glucose-Capillary: 87 mg/dL (ref 70–99)

## 2019-03-09 LAB — IRON AND TIBC
Iron: 35 ug/dL (ref 28–170)
Saturation Ratios: 16 % (ref 10.4–31.8)
TIBC: 218 ug/dL — ABNORMAL LOW (ref 250–450)
UIBC: 183 ug/dL

## 2019-03-09 LAB — HEMOGLOBIN A1C
Hgb A1c MFr Bld: 6.2 % — ABNORMAL HIGH (ref 4.8–5.6)
Mean Plasma Glucose: 131 mg/dL

## 2019-03-09 LAB — HIV ANTIBODY (ROUTINE TESTING W REFLEX): HIV Screen 4th Generation wRfx: NONREACTIVE

## 2019-03-09 LAB — HEPATITIS B SURFACE ANTIGEN: Hepatitis B Surface Ag: NEGATIVE

## 2019-03-09 MED ORDER — SODIUM CHLORIDE 0.9 % IV SOLN: 100.0000 mL | INTRAVENOUS | Status: DC | PRN

## 2019-03-09 MED ORDER — HEPARIN SODIUM (PORCINE) 1000 UNIT/ML DIALYSIS: 20.0000 [IU]/kg | INTRAMUSCULAR | Status: DC | PRN

## 2019-03-09 MED ORDER — CYCLOBENZAPRINE HCL 10 MG PO TABS
10.0000 mg | ORAL_TABLET | Freq: Every day | ORAL | Status: AC | PRN
  Administered 2019-03-09: 10 mg via ORAL
  Filled 2019-03-09: qty 1

## 2019-03-09 MED ORDER — HEPARIN SODIUM (PORCINE) 1000 UNIT/ML DIALYSIS: 1000.0000 [IU] | INTRAMUSCULAR | Status: DC | PRN

## 2019-03-09 MED ORDER — LIDOCAINE HCL (PF) 1 % IJ SOLN: 5.0000 mL | INTRAMUSCULAR | Status: DC | PRN

## 2019-03-09 MED ORDER — ALTEPLASE 2 MG IJ SOLR: 2.0000 mg | Freq: Once | INTRAMUSCULAR | Status: DC | PRN

## 2019-03-09 MED ORDER — ACETAMINOPHEN 325 MG PO TABS
ORAL_TABLET | ORAL | Status: AC
  Filled 2019-03-09: qty 2

## 2019-03-09 MED ORDER — DEXTROSE 50 % IV SOLN: 12.5000 g | INTRAVENOUS | Status: AC

## 2019-03-09 MED ORDER — HYDRALAZINE HCL 20 MG/ML IJ SOLN
5.0000 mg | Freq: Once | INTRAMUSCULAR | Status: DC
  Administered 2019-03-09: 5 mg via INTRAVENOUS
  Filled 2019-03-09: qty 1

## 2019-03-09 MED ORDER — LIDOCAINE-PRILOCAINE 2.5-2.5 % EX CREA: 1.0000 "application " | TOPICAL_CREAM | CUTANEOUS | Status: DC | PRN

## 2019-03-09 MED ORDER — DEXTROSE 50 % IV SOLN
1.0000 | Freq: Once | INTRAVENOUS | Status: AC
  Administered 2019-03-09: 50 mL via INTRAVENOUS

## 2019-03-09 MED ORDER — PENTAFLUOROPROP-TETRAFLUOROETH EX AERO: 1.0000 "application " | INHALATION_SPRAY | CUTANEOUS | Status: DC | PRN

## 2019-03-09 MED ORDER — DARBEPOETIN ALFA 200 MCG/0.4ML IJ SOSY: 200.0000 ug | PREFILLED_SYRINGE | INTRAMUSCULAR | Status: DC

## 2019-03-09 NOTE — Progress Notes (Signed)
Legs wraps changed. Refusing prevalon boots as pt prefers to stay in chair with legs down. CBG 89, currently eating dinner. Will continue to monitor.

## 2019-03-09 NOTE — Progress Notes (Signed)
PT Cancellation Note  Patient Details Name: NELSON WESTERGAARD MRN: [id number] DOB: Mar 19, 1951   Cancelled Treatment:    Reason Eval/Treat Not Completed: Fatigue/lethargy limiting ability to participate Pt lethargic, unable to stay engaged for evaluation at this time. (Noted early termination of dialysis today.) Likely more appropriate for comprehensive PT evaluation tomorrow.  Ellouise Newer 03/09/2019, 12:10 PM

## 2019-03-09 NOTE — Progress Notes (Signed)
[facility] Progress Note  Subjective: up in chair, TPA worked and had catheter HD last night  Vitals:   03/09/19 0413 03/09/19 0426 03/09/19 0538 03/09/19 1023  BP:  (!) 180/104  (!) 147/86  Pulse:    79  Resp:  17  20  Temp:  97.8 F (36.6 C)  97.6 F (36.4 C)  TempSrc:  Oral  Oral  SpO2:  100%  98%  Weight: 102.8 kg  102 kg   Height:        Inpatient medications: . aspirin  81 mg Oral Daily  . Chlorhexidine Gluconate Cloth  6 each Topical Q0600  . darbepoetin (ARANESP) injection - DIALYSIS  200 mcg Intravenous Q Fri-HD  . ferrous sulfate  325 mg Oral Q breakfast  . heparin  5,000 Units Subcutaneous Q8H  . hydrocerin   Topical BID  . insulin aspart  0-5 Units Subcutaneous QHS  . insulin aspart  0-9 Units Subcutaneous TID WC  . multivitamin  1 tablet Oral QHS  . sodium chloride flush  3 mL Intravenous Q12H    acetaminophen **OR** acetaminophen    Exam: General: Chronically ill appearing older female in no acute distress, up in chair and sleeping Neck: Supple. JVD 1/2 to mandible Lungs: Few crackles L base, otherwise CTAB Heart: HS distant, S1,S2. RRR.  Abdomen: Obese, active BS, probable ascites present M-S:  Strength and tone appear normal for age. Lower extremities: Woody appearance BLE with open blisters on BLE. 2+ BLE edema.   Neuro: Alert and oriented X 3. Moves all ext, gen weak and no asterixis or confusion Dialysis Access: RIJ [facility]  Dialysis: No HD center  Assessment/Plan: 1.  Hypertensive Urgency: Per primary. Resume home meds. Resume HD here.  2.  Volume overload: Has missed HD for approx 1 year. CXR with cardiomegaly and vascular congestion. Sig leg edema. Lower volume as tolerated.  3.  ESRD -  Started HD 12/2016 and went through 02/21/2018 (last HD). Has been noncompliant since. She was discharged by [facility] for severe noncompliance and does not currently have an OP HD unit. Discussed option of resuming HD or going to Hospice. Will consult  palliative care for [place].  She is opting to resume HD at least in the hospital, we have not discussed long-term issues yet. We are proceeding w/ low BFR/DFR to avoid dysequilibrium syndrome. EGFR 3 today but clearly has residual RF or she could not survive without HD this long. Had HD yest after TPA to [facility] (which has been in for years now). HD again today then Monday, slowly ^'ing time and BFR/ DFR.  Will start CLIP process for [facility].  4.  Hypertension/volume  - Resume HD today. UF 2 liters. Has not taken any BP meds for long time. Previously on BiDil and metoprolol. Will start with lowering volume and add antihypertensive meds as HD progresses.   5.  Anemia  - HGB 8.7.Give ESA with HD today. Order iron panel.  6.  Metabolic bone disease -  Check phos/PTH. Resume binders/VDRA when labs available. Ca 8.2.  7.  Nutrition - Renal/Carb mod diet.  8. Wounds bilateral LE-Wound consult. Lower volume.    Rob Raymond Bhardwaj 03/09/2019, 12:08 PM  Iron/TIBC/Ferritin/ %Sat    Component Value Date/Time   IRON 35 03/09/2019 0711   TIBC 218 (L) 03/09/2019 0711   FERRITIN 46 12/22/2016 1259   IRONPCTSAT 16 03/09/2019 0711   Recent Labs  Lab 03/09/19 0402  NA 136  K 4.5  CL 106  CO2 16*  GLUCOSE 129*  BUN 76*  CREATININE 10.33*  CALCIUM 7.8*  PHOS 6.9*  ALBUMIN 2.4*   No results for input(s): AST, ALT, ALKPHOS, BILITOT, PROT in the last 168 hours. Recent Labs  Lab 03/09/19 0405  WBC 6.5  HGB 7.5*  HCT 24.3*  PLT 163

## 2019-03-09 NOTE — Progress Notes (Signed)
Cleansed and dressed pt's legs per orders. PRN tylenol given for soreness in her legs. States that is what she typically takes for pain at home.

## 2019-03-09 NOTE — Progress Notes (Signed)
Received pt from HD unit. Ended HD early 2/2 worsening 10/10 leg pain. PT hypertensive at 180/104 upon return as well. Pt stated she usually sleeps in chair at night as leg pain is lessened when sitting with them in a dependant position. Mariah Milling MD for IM was paged and new orders given and carried out. Will continue to monitor. Pt may benefit from being in a chair during HD sessions?

## 2019-03-09 NOTE — Progress Notes (Signed)
Treatment ended per patient request. Goal not met. Patient states pain she was having in her legs prior to arrival to the unit is made worse by dialysis. Administered Tylenol and placed out of UF. Patient still wanted to end treatment early. Hemodialysis catheter worked effectively during treatment after activase extraction. 

## 2019-03-09 NOTE — Progress Notes (Signed)
Luther Redo, RN called and informed the patient's HD treatment has been moved to 03/11/19.

## 2019-03-09 NOTE — Progress Notes (Signed)
Hypoglycemic Event  CBG: 56  Treatment: 8 oz juice/soda/crackers  Symptoms: None   Follow-up CBG: Time:1625 CBG Result:132  Possible Reasons for Event: Inadequate meal intake  Comments/MD notified: Attending MD notified. Dextrose 50% administered.    Asa Lente

## 2019-03-09 NOTE — Progress Notes (Signed)
   Subjective: Phyllis Hernandez was seen sitting in a chair by her bedside. She reports that she is doing well today. She states that she slept well. She was not able to complete dialysis yesterday, per chart review it appears that she was having lower extremity pain. She is able to tell us that she is at [facility]yll[facility]. She does not know the month or current year.   Objective:  Vital signs in last 24 hours: Vitals:   03/09/19 0413 03/09/19 0426 03/09/19 0538 03/09/19 1023  BP:  (!) 180/104  (!) 147/86  Pulse:    79  Resp:  17  20  Temp:  97.8 F (36.6 C)  97.6 F (36.4 C)  TempSrc:  Oral  Oral  SpO2:  100%  98%  Weight: 102.8 kg  102 kg   Height:        General: Tired in appearance, obese elderly female, slow to respond commands.  Cardiac: RRR, no murmurs, rubs, or gallops Pulmonary: Clear to auscultation bilaterally, no wheezes, rales, or rhonchi Abdomen: soft, non tender to palpation, normal bowel sounds Extremity: Pitting edema in the lower extremities bilaterally.  Derm: Hyperpigmentation and dry skin of the lower extremities bilaterally, lower extremities are wrapped with no visual purulent drainage noted.  Psychiatry: somnolent, slow to follow commands, alert to place and self, but not time.    Assessment/Plan:  Principal Problem:   Volume overload Active Problems:   Type 2 diabetes mellitus with complication, without long-term current use of insulin (HCC)   ESRD (end stage renal disease) ([place])   Anemia of chronic disease   Hypertensive urgency   Volume Overload 2/2 to ESRD:  - 2/2 to missing/dismissal from HD sessions. 2018 Echo shows EF 60%, no evidence of valvular stenosis or regurgitation, no wall motion abnormalities, but mild left ventricular hypertrophy.  - Did not complete full session of dialysis this morning 2/2 to 10/10 leg pain.  - We appreciate nephrology's consult with Phyllis Hernandez's care.   Hypertensive Urgency: - Patient with labile pressures yesterday  ([id number] systolic MAP [id number]), latest pressure reading 180/104.   - Will continue to hold blood pressure medications and progress medications after dialysis.   - Troponin: 73   Bilateral LE wounds:  -  We appreciate WOC's recommendations and assistance with Phyllis Hernandez's medical care.  - Continue Eucerin     T2DM:  - Nonadherent to home diabetes medications - SSI  - Last CBG: 101   HLD:  - We will continue zetia, after verifying that patient takes it at home.     Diet: Renal  VTE ppx: Heparin Code Status: FULL   Dispo: Anticipated discharge pending medical course.   Maudie Mercury, MD 03/09/2019, 10:28 AM Pager: [phone]

## 2019-03-09 NOTE — Progress Notes (Signed)
Pt taken to HD at this time. Report given to HD RN.

## 2019-03-10 DIAGNOSIS — Z515 Encounter for palliative care: Secondary | ICD-10-CM

## 2019-03-10 DIAGNOSIS — Z7189 Other specified counseling: Secondary | ICD-10-CM

## 2019-03-10 LAB — CBC
HCT: 26.7 % — ABNORMAL LOW (ref 36.0–46.0)
Hemoglobin: 8.3 g/dL — ABNORMAL LOW (ref 12.0–15.0)
MCH: 23.4 pg — ABNORMAL LOW (ref 26.0–34.0)
MCHC: 31.1 g/dL (ref 30.0–36.0)
MCV: 75.2 fL — ABNORMAL LOW (ref 80.0–100.0)
Platelets: 161 10*3/uL (ref 150–400)
RBC: 3.55 MIL/uL — ABNORMAL LOW (ref 3.87–5.11)
RDW: 17.2 % — ABNORMAL HIGH (ref 11.5–15.5)
WBC: 7.6 10*3/uL (ref 4.0–10.5)
nRBC: 0 % (ref 0.0–0.2)

## 2019-03-10 LAB — GLUCOSE, CAPILLARY
Glucose-Capillary: 150 mg/dL — ABNORMAL HIGH (ref 70–99)
Glucose-Capillary: 123 mg/dL — ABNORMAL HIGH (ref 70–99)
Glucose-Capillary: 70 mg/dL (ref 70–99)

## 2019-03-10 LAB — RENAL FUNCTION PANEL
Albumin: 2.4 g/dL — ABNORMAL LOW (ref 3.5–5.0)
Anion gap: 17 — ABNORMAL HIGH (ref 5–15)
BUN: 77 mg/dL — ABNORMAL HIGH (ref 8–23)
CO2: 13 mmol/L — ABNORMAL LOW (ref 22–32)
Calcium: 7.9 mg/dL — ABNORMAL LOW (ref 8.9–10.3)
Chloride: 106 mmol/L (ref 98–111)
Creatinine, Ser: 11.1 mg/dL — ABNORMAL HIGH (ref 0.44–1.00)
GFR calc Af Amer: 4 mL/min — ABNORMAL LOW (ref >60)
GFR calc non Af Amer: 3 mL/min — ABNORMAL LOW (ref >60)
Glucose, Bld: 133 mg/dL — ABNORMAL HIGH (ref 70–99)
Phosphorus: 7.5 mg/dL — ABNORMAL HIGH (ref 2.5–4.6)
Potassium: 4.8 mmol/L (ref 3.5–5.1)
Sodium: 136 mmol/L (ref 135–145)

## 2019-03-10 LAB — PTH, INTACT AND CALCIUM
Calcium, Total (PTH): 7.9 mg/dL — ABNORMAL LOW (ref 8.7–10.3)
PTH: 667 pg/mL — ABNORMAL HIGH (ref 15–65)

## 2019-03-10 MED ORDER — SODIUM CHLORIDE 0.9 % IV SOLN: 100.0000 mL | INTRAVENOUS | Status: DC | PRN

## 2019-03-10 MED ORDER — CYCLOBENZAPRINE HCL 10 MG PO TABS
10.0000 mg | ORAL_TABLET | Freq: Every day | ORAL | Status: DC | PRN
  Administered 2019-03-10: 10 mg via ORAL
  Filled 2019-03-10: qty 1

## 2019-03-10 MED ORDER — EZETIMIBE 10 MG PO TABS
10.0000 mg | ORAL_TABLET | Freq: Every day | ORAL | Status: DC
  Administered 2019-03-11 – 2019-03-26 (×12): 10 mg via ORAL
  Filled 2019-03-10 (×11): qty 1

## 2019-03-10 MED ORDER — HYDRALAZINE HCL 20 MG/ML IJ SOLN
10.0000 mg | Freq: Four times a day (QID) | INTRAMUSCULAR | Status: DC | PRN
  Administered 2019-03-10: 20 mg via INTRAVENOUS
  Filled 2019-03-10: qty 1

## 2019-03-10 MED ORDER — OXYCODONE-ACETAMINOPHEN 5-325 MG PO TABS
1.0000 | ORAL_TABLET | ORAL | Status: DC | PRN
  Administered 2019-03-10 (×2): 1 via ORAL
  Administered 2019-03-11: 2 via ORAL
  Administered 2019-03-11 – 2019-03-22 (×3): 1 via ORAL
  Administered 2019-03-23: 06:00:00 2 via ORAL
  Filled 2019-03-10: qty 2
  Filled 2019-03-10 (×5): qty 1
  Filled 2019-03-10: qty 2
  Filled 2019-03-10: qty 1

## 2019-03-10 MED ORDER — AMLODIPINE BESYLATE 10 MG PO TABS
10.0000 mg | ORAL_TABLET | Freq: Every day | ORAL | Status: DC
  Administered 2019-03-10 – 2019-03-22 (×8): 10 mg via ORAL
  Filled 2019-03-10 (×10): qty 1

## 2019-03-10 MED ORDER — DARBEPOETIN ALFA 100 MCG/0.5ML IJ SOSY
100.0000 ug | PREFILLED_SYRINGE | INTRAMUSCULAR | Status: DC
  Administered 2019-03-10: 100 ug via SUBCUTANEOUS
  Filled 2019-03-10: qty 0.5

## 2019-03-10 MED ORDER — HEPARIN SODIUM (PORCINE) 1000 UNIT/ML DIALYSIS: 20.0000 [IU]/kg | INTRAMUSCULAR | Status: DC | PRN

## 2019-03-10 MED ORDER — ALTEPLASE 2 MG IJ SOLR: 2.0000 mg | Freq: Once | INTRAMUSCULAR | Status: DC | PRN

## 2019-03-10 MED ORDER — HEPARIN SODIUM (PORCINE) 1000 UNIT/ML DIALYSIS: 4000.0000 [IU] | INTRAMUSCULAR | Status: DC | PRN

## 2019-03-10 MED ORDER — DARBEPOETIN ALFA 100 MCG/0.5ML IJ SOSY
PREFILLED_SYRINGE | INTRAMUSCULAR | Status: AC
  Filled 2019-03-10: qty 0.5

## 2019-03-10 MED ORDER — PENTAFLUOROPROP-TETRAFLUOROETH EX AERO: 1.0000 "application " | INHALATION_SPRAY | CUTANEOUS | Status: DC | PRN

## 2019-03-10 MED ORDER — LIDOCAINE HCL (PF) 1 % IJ SOLN: 5.0000 mL | INTRAMUSCULAR | Status: DC | PRN

## 2019-03-10 MED ORDER — METOPROLOL TARTRATE 25 MG PO TABS
25.0000 mg | ORAL_TABLET | Freq: Two times a day (BID) | ORAL | Status: DC
  Administered 2019-03-10 – 2019-03-16 (×12): 25 mg via ORAL
  Filled 2019-03-10 (×13): qty 1

## 2019-03-10 MED ORDER — CHLORHEXIDINE GLUCONATE CLOTH 2 % EX PADS
6.0000 | MEDICATED_PAD | Freq: Every day | CUTANEOUS | Status: DC
  Administered 2019-03-10 – 2019-03-22 (×8): 6 via TOPICAL

## 2019-03-10 MED ORDER — HYDROMORPHONE HCL 1 MG/ML IJ SOLN: 1.0000 mg | INTRAMUSCULAR | Status: AC | PRN

## 2019-03-10 MED ORDER — METOPROLOL TARTRATE 12.5 MG HALF TABLET
12.5000 mg | ORAL_TABLET | Freq: Two times a day (BID) | ORAL | Status: DC
  Administered 2019-03-10: 10:00:00 12.5 mg via ORAL
  Filled 2019-03-10: qty 1

## 2019-03-10 MED ORDER — HEPARIN SODIUM (PORCINE) 1000 UNIT/ML IJ SOLN
INTRAMUSCULAR | Status: AC
  Filled 2019-03-10: qty 1

## 2019-03-10 MED ORDER — HEPARIN SODIUM (PORCINE) 1000 UNIT/ML DIALYSIS: 1000.0000 [IU] | INTRAMUSCULAR | Status: DC | PRN

## 2019-03-10 MED ORDER — LIDOCAINE-PRILOCAINE 2.5-2.5 % EX CREA
1.0000 "application " | TOPICAL_CREAM | CUTANEOUS | Status: DC | PRN
  Filled 2019-03-10: qty 5

## 2019-03-10 NOTE — Progress Notes (Signed)
   Subjective:  [name] was seen at bedside this morning. She states that she is doing okay today. She states that she kept waking up through the night due to her leg pain. She states that the pain is doing better this morning when compared to yesterday. We spoke about her dialysis session for tomorrow. We also spoke about her blood pressure and adding her anti hypertensive medications to her regime today. She was agreeable to the plan. All questions and concerns were addressed.   Objective:  Vital signs in last 24 hours: Vitals:   03/09/19 1938 03/09/19 2333 03/10/19 0437 03/10/19 0500  BP: (!) 171/95 (!) 166/86 (!) 183/79   Pulse: 79 85 84   Resp:  17 20   Temp: 97.8 F (36.6 C) 98.6 F (37 C) (!) 97.3 F (36.3 C)   TempSrc: Oral Oral Oral   SpO2: 100% 100% 98%   Weight:    103.4 kg  Height:      Physical Exam Constitutional:      General: She is not in acute distress.    Appearance: Normal appearance. She is obese. She is not ill-appearing.  HENT:     Head: Normocephalic and atraumatic.  Cardiovascular:     Rate and Rhythm: Normal rate and regular rhythm.     Pulses: Normal pulses.     Heart sounds: Normal heart sounds. No murmur. No gallop.   Neurological:     Mental Status: She is alert.     Assessment/Plan:  Principal Problem:   Volume overload Active Problems:   Type 2 diabetes mellitus with complication, without long-term current use of insulin (HCC)   ESRD (end stage renal disease) ([place])   Anemia of chronic disease   Hypertensive urgency  Volume Overload 2/2 to ESRD: - 2/2 to missing/dismissal from HD sessions. 2018 Echo shows EF 60%, no evidence of valvular stenosis or regurgitation, no wall motion abnormalities, but mild left ventricular hypertrophy.  - Dialysis scheduled for 03/11/2019   Hypertensive Urgency: - Pressures remain elevated. We will start home medications.  - Start Amlodipine 10 mg QD - Start Lopressor 25 mg BID  - Continue Hydralazine  PRN Q6H   Bilateral LE wounds:  - Continue Eucerin. - continue changing wrappings as needed.   T2DM:  - Nonadherent to home diabetes medications - SSI  - Last CBG: 150   HLD:  - Ordered Zetia 10 mg  Dispo: Anticipated discharge pending medical course.    Phyllis Mercury, MD 03/10/2019, 11:52 AM Pager: [phone]

## 2019-03-10 NOTE — Evaluation (Signed)
Occupational Therapy Evaluation Patient Details Name: Phyllis Hernandez MRN: [id number] DOB: [birth year] Today's Date: 03/10/2019    History of Present Illness Pt is a 68 y.o. female admitted 03/08/19 with volume overload and hypertensive overload after missing HD sessions (per chart, has missed HD for approximately one year); pt opting to resume HD while admitted, but unable to tolerate complete session due to BLE pain. PMH includes ESRD (on HD), DM2, HTN, depression.   Clinical Impression   PTA patient reports living alone, having intermittent assistance from son but needing more assistance.  She reports multiple falls at home, difficulty with bathing and LB self care, managing ADLs and IADLs as able.  She was admitted for above and limited by problem list below, including impaired balance, decreased activity tolerance, generalized weakness, and edema.  She presents with DOE with minimal activity, BP monitored as below (RN notified of hypertension).  She requires min assist for UB ADLs, max assist for LB ADLs and mod assist for transfers.  She will benefit from continued OT services while admitted and after dc at SNF level in order to maximize independence and safety with ADLs/mobility.    Supine BP 180/87; EOB 206/83, EOB x 2 min 193/100, post mobility supine 221/97    Follow Up Recommendations  Supervision/Assistance - 24 hour;SNF    Equipment Recommendations  3 in 1 bedside commode    Recommendations for Other Services       Precautions / Restrictions Precautions Precautions: Fall Precaution Comments: Bilateral feet/lower leg wounds wrapped in ace wrap, watch BP Restrictions Weight Bearing Restrictions: No      Mobility Bed Mobility Overal bed mobility: Needs Assistance Bed Mobility: Supine to Sit;Sit to Supine     Supine to sit: Min guard;HOB elevated Sit to supine: Min assist   General bed mobility comments: MinA to assist BLEs back into bed; increased time and effort  for all mobility  Transfers Overall transfer level: Needs assistance Equipment used: Rolling walker (2 wheeled) Transfers: Sit to/from Stand Sit to Stand: Mod assist         General transfer comment: Significant increased time to prepare to stand, cues for technique/hand placement, eventually requiring modA for trunk elevation; pt c/o painful knees when attempting to stand. DOE with minimal activity    Balance Overall balance assessment: Needs assistance Sitting-balance support: No upper extremity supported;Feet supported Sitting balance-Leahy Scale: Fair Sitting balance - Comments: statically with supervision   Standing balance support: Bilateral upper extremity supported;During functional activity Standing balance-Leahy Scale: Poor Standing balance comment: relaint on BUE and external support                           ADL either performed or assessed with clinical judgement   ADL Overall ADL's : Needs assistance/impaired     Grooming: Set up;Sitting   Upper Body Bathing: Set up;Sitting   Lower Body Bathing: Moderate assistance;Sit to/from stand   Upper Body Dressing : Minimal assistance;Sitting   Lower Body Dressing: Moderate assistance;Sit to/from stand   Toilet Transfer: Moderate assistance Toilet Transfer Details (indicate cue type and reason): simulated in room, side stepping toward [facility] with RW          Functional mobility during ADLs: Moderate assistance;+2 for physical assistance General ADL Comments: patient limited by weakness, edema, decreased activity tolerance     Vision         Perception     Praxis      Pertinent Vitals/Pain  Pain Assessment: Faces Faces Pain Scale: Hurts a little bit Pain Location: Lower back Pain Descriptors / Indicators: Sore Pain Intervention(s): Monitored during session     Hand Dominance Right   Extremity/Trunk Assessment Upper Extremity Assessment Upper Extremity Assessment: Generalized weakness    Lower Extremity Assessment Lower Extremity Assessment: Defer to PT evaluation       Communication Communication Communication: Expressive difficulties(at time mumbling answers)   Cognition Arousal/Alertness: Awake/alert Behavior During Therapy: WFL for tasks assessed/performed Overall Cognitive Status: No family/caregiver present to determine baseline cognitive functioning Area of Impairment: Orientation;Attention;Following commands;Safety/judgement;Awareness;Problem solving                 Orientation Level: Time(reports october ) Current Attention Level: Selective   Following Commands: Follows one step commands with increased time Safety/Judgement: Decreased awareness of deficits Awareness: Emergent Problem Solving: Slow processing;Requires verbal cues General Comments: Eventually able to get to correct date with cues. Sometimes soft, mumbling speech, generally slowed processing; pt also clearly fatigued   General Comments  Vitals assessed during session    Exercises     Shoulder Instructions      Home Living Family/patient expects to be discharged to:: Private residence Living Arrangements: Alone Available Help at Discharge: Family;Available PRN/intermittently Type of Home: Apartment Home Access: Level entry     Home Layout: One level     Bathroom Shower/Tub: Teacher, early years/pre: Standard     Home Equipment: Cane - single point;Walker - 2 wheels          Prior Functioning/Environment Level of Independence: Independent with assistive device(s)  Gait / Transfers Assistance Needed: Reports ambulating with SPC around house. Friend provides transportation. Typically orders food for delivery. Reports 3x recent falls in the house ADL's / Homemaking Assistance Needed: Requires needing assist for bathing, but doesn't currently have anyone to help; stands for showers. Reports difficulty cleaning house            OT Problem List: Decreased  strength;Decreased activity tolerance;Impaired balance (sitting and/or standing);Increased edema;Pain;Obesity;Cardiopulmonary status limiting activity;Decreased knowledge of precautions;Decreased knowledge of use of DME or AE;Decreased safety awareness;Decreased cognition      OT Treatment/Interventions: Self-care/ADL training;Therapeutic exercise;Energy conservation;DME and/or AE instruction;Therapeutic activities;Cognitive remediation/compensation;Patient/family education;Balance training    OT Goals(Current goals can be found in the care plan section) Acute Rehab OT Goals Patient Stated Goal: Agreeable to rehab at SNF before return home; wants more help at home OT Goal Formulation: With patient Time For Goal Achievement: 03/24/19 Potential to Achieve Goals: Good  OT Frequency: Min 2X/week   Barriers to D/C: Decreased caregiver support          Co-evaluation PT/OT/SLP Co-Evaluation/Treatment: Yes Reason for Co-Treatment: To address functional/ADL transfers   OT goals addressed during session: ADL's and self-care      AM-PAC OT "6 Clicks" Daily Activity     Outcome Measure Help from another person eating meals?: A Little Help from another person taking care of personal grooming?: A Little Help from another person toileting, which includes using toliet, bedpan, or urinal?: A Lot Help from another person bathing (including washing, rinsing, drying)?: A Lot Help from another person to put on and taking off regular upper body clothing?: A Little Help from another person to put on and taking off regular lower body clothing?: A Lot 6 Click Score: 15   End of Session Equipment Utilized During Treatment: Gait belt;Rolling walker Nurse Communication: Mobility status;Other (comment)(BP)  Activity Tolerance: Treatment limited secondary to medical complications (Comment)(elevated BP )  Patient left: in bed;with call bell/phone within reach;with bed alarm set  OT Visit Diagnosis: Other  abnormalities of gait and mobility (R26.89);Muscle weakness (generalized) (M62.81);Pain;History of falling (Z91.81) Pain - part of body: (back)                Time: [id number] OT Time Calculation (min): 24 min Charges:  OT General Charges $OT Visit: 1 Visit OT Evaluation $OT Eval Moderate Complexity: 1 [place], OT Acute Rehabilitation Services Pager [phone] Office [phone]   Delight Stare 03/10/2019, 11:59 AM

## 2019-03-10 NOTE — Evaluation (Signed)
Physical Therapy Evaluation Patient Details Name: Phyllis Hernandez MRN: [id number] DOB: 05-26-1951 Today's Date: 03/10/2019   History of Present Illness  Pt is a 68 y.o. female admitted 03/08/19 with volume overload and hypertensive overload after missing HD sessions (per chart, has missed HD for approximately one year); pt opting to resume HD while admitted, but unable to tolerate complete session due to BLE pain. PMH includes ESRD (on HD), DM2, HTN, depression.    Clinical Impression  Pt presents with an overall decrease in functional mobility secondary to above. PTA, pt reportedly lives alone and limited household ambulator with [facility], reports multiple falls; pt reports needing more assist at home with ADLs and household tasks. Today, pt able to stand with walk short distance with RW, requiring up to [place]; DOE with minimal activity; at high risk for falls. Mobility limited by hypertension (see values below; RN notified). Pt would benefit from continued acute PT services to maximize functional mobility and independence prior to d/c with SNF-level therapies; pt in agreement.   Supine BP 180/87 Sitting BP 206/83 Sitting ~2 min BP 193/100 Post mobility, return to supine BP 221/97    Follow Up Recommendations SNF;Supervision/Assistance - 24 hour    Equipment Recommendations  (TBD next venue)    Recommendations for Other Services       Precautions / Restrictions Precautions Precautions: Fall Precaution Comments: Bilateral feet/lower leg wounds wrapped in ace wrap Restrictions Weight Bearing Restrictions: No      Mobility  Bed Mobility Overal bed mobility: Needs Assistance Bed Mobility: Supine to Sit;Sit to Supine     Supine to sit: Min guard;HOB elevated Sit to supine: Min assist   General bed mobility comments: MinA to assist BLEs back into bed; increased time and effort for all mobility  Transfers Overall transfer level: Needs assistance Equipment used: Rolling walker (2  wheeled) Transfers: Sit to/from Stand Sit to Stand: Mod assist         General transfer comment: Significant increased time to prepare to stand, cues for technique/hand placement, eventually requiring modA for trunk elevation; pt c/o painful knees when attempting to stand. DOE with minimal activity  Ambulation/Gait Ambulation/Gait assistance: Herbalist (Feet): 3 Feet Assistive device: Rolling walker (2 wheeled) Gait Pattern/deviations: Step-to pattern     General Gait Details: Marching in place then side steps towards [facility] with RW and minA for balance; further mobility limited by hypertension. Pt fatigued with minimal activity  Stairs            Wheelchair Mobility    Modified Rankin (Stroke Patients Only)       Balance Overall balance assessment: Needs assistance   Sitting balance-Leahy Scale: Fair Sitting balance - Comments: Unable to don socks sitting EOB                                     Pertinent Vitals/Pain Pain Assessment: Faces Faces Pain Scale: Hurts a little bit Pain Location: Lower back Pain Descriptors / Indicators: Sore Pain Intervention(s): Monitored during session    Home Living Family/patient expects to be discharged to:: (P) Private residence Living Arrangements: (P) Alone Available Help at Discharge: (P) Family;Available PRN/intermittently Type of Home: (P) Apartment Home Access: (P) Level entry     Home Layout: (P) One level Home Equipment: (P) Cane - single point;Walker - 2 wheels      Prior Function Level of Independence: (P) Independent with assistive  device(s);Needs assistance   Gait / Transfers Assistance Needed: (P) Reports ambulating with SPC around house. Friend provides transportation. Typically orders food for delivery. Reports 3x recent falls in the house  ADL's / Homemaking Assistance Needed: (P) Requires needing assist for bathing, but doesn't currently have anyone to help; stands for  showers. Reports difficulty cleaning house        Hand Dominance        Extremity/Trunk Assessment   Upper Extremity Assessment Upper Extremity Assessment: Generalized weakness    Lower Extremity Assessment Lower Extremity Assessment: Generalized weakness       Communication   Communication: (P) Expressive difficulties(sometimes mumbling speech)  Cognition Arousal/Alertness: Awake/alert Behavior During Therapy: WFL for tasks assessed/performed Overall Cognitive Status: No family/caregiver present to determine baseline cognitive functioning Area of Impairment: Orientation;Attention;Following commands;Safety/judgement;Awareness;Problem solving                   Current Attention Level: Selective   Following Commands: Follows one step commands with increased time Safety/Judgement: Decreased awareness of deficits Awareness: Emergent Problem Solving: Slow processing;Requires verbal cues General Comments: Eventually able to get to correct date with cues. Sometimes soft, mumbling speech, generally slowed processing; pt also clearly fatigued      General Comments      Exercises     Assessment/Plan    PT Assessment Patient needs continued PT services  PT Problem List Decreased strength;Decreased activity tolerance;Decreased balance;Decreased mobility;Decreased cognition;Decreased knowledge of use of DME;Cardiopulmonary status limiting activity;Pain       PT Treatment Interventions DME instruction;Gait training;Stair training;Functional mobility training;Therapeutic activities;Therapeutic exercise;Balance training;Patient/family education    PT Goals (Current goals can be found in the Care Plan section)  Acute Rehab PT Goals Patient Stated Goal: Agreeable to rehab at SNF before return home; wants more help at home PT Goal Formulation: With patient Time For Goal Achievement: 03/24/19 Potential to Achieve Goals: Good    Frequency Min 2X/week   Barriers to  discharge Decreased caregiver support      Co-evaluation               AM-PAC PT "6 Clicks" Mobility  Outcome Measure Help needed turning from your back to your side while in a flat bed without using bedrails?: A Little Help needed moving from lying on your back to sitting on the side of a flat bed without using bedrails?: A Little Help needed moving to and from a bed to a chair (including a wheelchair)?: A Lot Help needed standing up from a chair using your arms (e.g., wheelchair or bedside chair)?: A Lot Help needed to walk in hospital room?: A Lot Help needed climbing 3-5 steps with a railing? : A Lot 6 Click Score: 14    End of Session   Activity Tolerance: Patient limited by fatigue Patient left: in chair;with call bell/phone within reach;with chair alarm set Nurse Communication: Mobility status PT Visit Diagnosis: Other abnormalities of gait and mobility (R26.89);Muscle weakness (generalized) (M62.81);History of falling (Z91.81)    Time: [id number] PT Time Calculation (min) (ACUTE ONLY): 24 min   Charges:   PT Evaluation $PT Eval Moderate Complexity: 1 Mod     Mabeline Caras, PT, DPT Acute Rehabilitation Services  Pager [phone] Office [phone]  Derry Lory 03/10/2019, 10:40 AM

## 2019-03-10 NOTE — Progress Notes (Signed)
[facility] Progress Note  Subjective: legs hurt. BP's high.   Vitals:   03/09/19 1938 03/09/19 2333 03/10/19 0437 03/10/19 0500  BP: (!) 171/95 (!) 166/86 (!) 183/79   Pulse: 79 85 84   Resp:  17 20   Temp: 97.8 F (36.6 C) 98.6 F (37 C) (!) 97.3 F (36.3 C)   TempSrc: Oral Oral Oral   SpO2: 100% 100% 98%   Weight:    103.4 kg  Height:        Inpatient medications: . amLODipine  10 mg Oral Daily  . aspirin  81 mg Oral Daily  . Chlorhexidine Gluconate Cloth  6 each Topical Q0600  . [START ON 03/11/2019] darbepoetin (ARANESP) injection - DIALYSIS  200 mcg Intravenous Q Mon-HD  . ferrous sulfate  325 mg Oral Q breakfast  . heparin  5,000 Units Subcutaneous Q8H  . hydrocerin   Topical BID  . insulin aspart  0-5 Units Subcutaneous QHS  . insulin aspart  0-9 Units Subcutaneous TID WC  . metoprolol tartrate  25 mg Oral BID  . multivitamin  1 tablet Oral QHS  . sodium chloride flush  3 mL Intravenous Q12H    acetaminophen **OR** acetaminophen    Exam: General: Chronically ill appearing older female in no acute distress, up in chair and sleeping Neck: Supple. JVD 1/2 to mandible Lungs: Few crackles L base, otherwise CTAB Heart: HS distant, S1,S2. RRR.  Abdomen: Obese, active BS, probable ascites present M-S:  Strength and tone appear normal for age. Lower extremities: Woody appearance BLE with open blisters on BLE. 2+ BLE edema.   Neuro: Alert and oriented X 3. Moves all ext, gen weak and no asterixis or confusion Dialysis Access: [facility]  Dialysis: No HD center  Assessment/Plan: 1.   ESRD -  Started HD 12/2016 and went through 02/21/2018 (last HD). Did not return to dialysis and was therefore discharged by [facility] for severe noncompliance. Pt does not currently have an OP HD unit. Not sure that patient will agree to long-term dialysis, she has agreed to trying in-hospital dialysis.  Will consult palliative care for [place].  She c/o severe leg pains on HD which  for now at least will try to control w/ IV/ po pain meds. 1st HD on Friday pt only stayed on machine 1 hour , poor UF 600 cc, signed off due to pain.  2nd HD today then again Monday, try to get volume down to see if leg pains improve and if pt tolerates dialysis and wants to do long-term then re-CLIP.  2. Hypertensive/ volume : add norvasc, IV prn and ^'d metoprolol. Sig vol overload, aggressive UF 3.  Anemia  - HGB 8.7.Give ESA with HD today. Order iron panel.  4.  Metabolic bone disease -  Check phos/PTH. Resume binders/VDRA when labs available. Ca 8.2.  5.  Nutrition - Renal/Carb mod diet.  6. Wounds bilateral LE-Wound consult. Lower volume.    Rob Alcides Nutting 03/10/2019, 10:26 AM  Iron/TIBC/Ferritin/ %Sat    Component Value Date/Time   IRON 35 03/09/2019 0711   TIBC 218 (L) 03/09/2019 0711   FERRITIN 46 12/22/2016 1259   IRONPCTSAT 16 03/09/2019 0711   Recent Labs  Lab 03/09/19 0402  NA 136  K 4.5  CL 106  CO2 16*  GLUCOSE 129*  BUN 76*  CREATININE 10.33*  CALCIUM 7.8*  PHOS 6.9*  ALBUMIN 2.4*   No results for input(s): AST, ALT, ALKPHOS, BILITOT, PROT in the last 168 hours.  Recent Labs  Lab 03/09/19 0405  WBC 6.5  HGB 7.5*  HCT 24.3*  PLT 163

## 2019-03-10 NOTE — Consult Note (Signed)
Consultation Note Date: 03/10/2019   Patient Name: Phyllis Hernandez  DOB: 11-07-1950  MRN: [id number]  Age / Sex: 68 y.o., female  PCP: Patient, No Pcp Per Referring Physician: Oda Kilts, MD  Reason for Consultation: Establishing goals of care  HPI/Patient Profile: 68 y.o. female  with past medical history of ESRD but noncompliant with HD (last HD 02/22/19), HTN, HLD, diabetes, morbid obesity admitted on 03/08/2019 with fatigue and SOB. Found to have hypertensive urgency and volume overload needing HD.   Clinical Assessment and Goals of Care: I met today with Phyllis Hernandez after reviewing her electronic records and history. She has a long history or poor compliance with dialysis. She owns this and says that it is only her fault that she is in this position. She cannot give a particular reason for not having dialysis other than vague complaints such as nausea and leg pain. We discussed that some of these symptoms can worsen without dialysis and if dialysis is not done properly and on schedule then the symptoms will often worsen during dialysis. She states that she has transportation to dialysis and that she has supportive family and friends. She lives alone.   We discussed the fact frankly that she is at a crossroads with no good decisions. We discussed that if she continues to do dialysis then this will need to change and she will need to suffer through the side effects and attend treatments as scheduled and recommended. Recognizing that dialysis is difficult and can often make people feel horrible and have a horrible QOL. We discussed that we would also support her if she decided that she does not want to live this way and go through the pain and suffering associated with dialysis. She is very tearful and tells me that she knows she needs to "do better."  During our conversation it seems evident that she does not  want to do dialysis but is very fearful of dying. She desires full code and could think of no scenario (at this time) in which she would not desire full aggressive care. We discussed the importance of HCPOA and Living Will and she expresses a desire to complete. I bring her the forms to read over. Of note, she is legally married but does not live with her husband and verbalized that her son, Phyllis Hernandez, would be her desired surrogate decision maker. We discussed the importance of documenting this now and the importance of having some of these difficult conversations with Phyllis Hernandez. She understands the importance.   All questions/concerns addressed. Emotional support provided.   Primary Decision Maker HCPOA not yet documented, son Phyllis Hernandez    SUMMARY OF RECOMMENDATIONS   - Wishes to "restart" and continue with HD and states she will do better this time  Code Status/Advance Care Planning:  Full code   Symptom Management:   Per primary and renal  Palliative Prophylaxis:   Bowel Regimen, Delirium Protocol and Frequent Pain Assessment  Additional Recommendations (Limitations, Scope, Preferences):  Full Scope Treatment  Psycho-social/Spiritual:   Desire  for further Chaplaincy support: yes for Advance Directives  Additional Recommendations: Caregiving  Support/Resources and Referral to [employer]   Prognosis:   Unable to determine  Discharge Planning: Home with Home Health      Primary Diagnoses: Present on Admission: . Hypertensive urgency . Type 2 diabetes mellitus with complication, without long-term current use of insulin ([place]) . ESRD (end stage renal disease) ([place]) . Anemia of chronic disease . Volume overload   I have reviewed the medical record, interviewed the patient and family, and examined the patient. The following aspects are pertinent.  Past Medical History:  Diagnosis Date  . Allergy   . Anemia   . Anemia in CKD (chronic kidney disease)   . Depression   .  Diabetes mellitus without complication ([place])   . Hyperlipidemia   . Hypertension    Social History   Socioeconomic History  . Marital status: Legally Separated    Spouse name: Not on file  . Number of children: Not on file  . Years of education: Not on file  . Highest education level: Not on file  Occupational History  . Not on file  Social Needs  . Financial resource strain: Not on file  . Food insecurity    Worry: Not on file    Inability: Not on file  . Transportation needs    Medical: Not on file    Non-medical: Not on file  Tobacco Use  . Smoking status: Never Smoker  . Smokeless tobacco: Never Used  Substance and Sexual Activity  . Alcohol use: No    Frequency: Never  . Drug use: No  . Sexual activity: Not on file  Lifestyle  . Physical activity    Days per week: Not on file    Minutes per session: Not on file  . Stress: Not on file  Relationships  . Social Herbalist on phone: Not on file    Gets together: Not on file    Attends religious service: Not on file    Active member of club or organization: Not on file    Attends meetings of clubs or organizations: Not on file    Relationship status: Not on file  Other Topics Concern  . Not on file  Social History Narrative  . Not on file   Family History  Problem Relation Age of Onset  . Hypertension Mother   . Diabetes Mother   . Hypertension Maternal Grandmother   . Diabetes Maternal Grandmother    Scheduled Meds: . aspirin  81 mg Oral Daily  . Chlorhexidine Gluconate Cloth  6 each Topical Q0600  . [START ON 03/11/2019] darbepoetin (ARANESP) injection - DIALYSIS  200 mcg Intravenous Q Mon-HD  . ferrous sulfate  325 mg Oral Q breakfast  . heparin  5,000 Units Subcutaneous Q8H  . hydrocerin   Topical BID  . insulin aspart  0-5 Units Subcutaneous QHS  . insulin aspart  0-9 Units Subcutaneous TID WC  . metoprolol tartrate  12.5 mg Oral BID  . multivitamin  1 tablet Oral QHS  . sodium chloride  flush  3 mL Intravenous Q12H   Continuous Infusions: PRN Meds:.acetaminophen **OR** acetaminophen Allergies  Allergen Reactions  . No Known Allergies    Review of Systems  Constitutional: Positive for activity change and fatigue.  Respiratory: Positive for shortness of breath.   Neurological: Positive for weakness.    Physical Exam Vitals signs and nursing note reviewed.  Constitutional:  Appearance: She is well-groomed. She is ill-appearing.     Comments: Mod SOB at rest  Cardiovascular:     Rate and Rhythm: Normal rate.  Pulmonary:     Comments: Mild-Mod SOB at rest Abdominal:     Palpations: Abdomen is soft.  Neurological:     Mental Status: She is alert and oriented to person, place, and time.     Vital Signs: BP (!) 183/79 (BP Location: Right Arm)   Pulse 84   Temp (!) 97.3 F (36.3 C) (Oral)   Resp 20   Ht '5\' 4"'  (1.626 m)   Wt 103.4 kg Comment: BED WEIGHT  SpO2 98%   BMI 39.13 kg/m  Pain Scale: 0-10   Pain Score: 5    SpO2: SpO2: 98 % O2 Device:SpO2: 98 % O2 Flow Rate: .   IO: Intake/output summary:   Intake/Output Summary (Last 24 hours) at 03/10/2019 0932 Last data filed at 03/10/2019 0300 Gross per 24 hour  Intake 480 ml  Output 800 ml  Net -320 ml    LBM:   Baseline Weight: Weight: 101.3 kg Most recent weight: Weight: 103.4 kg(BED WEIGHT)     Palliative Assessment/Data:     Time In: 0930 Time Out: 1020 Time Total: 50 min Greater than 50%  of this time was spent counseling and coordinating care related to the above assessment and plan.  Signed by: Vinie Sill, NP Palliative Medicine Team Pager # [phone] (M-F 8a-5p) Team Phone # [phone] (Nights/Weekends)

## 2019-03-11 DIAGNOSIS — X58XXXA Exposure to other specified factors, initial encounter: Secondary | ICD-10-CM

## 2019-03-11 DIAGNOSIS — S81802A Unspecified open wound, left lower leg, initial encounter: Secondary | ICD-10-CM

## 2019-03-11 DIAGNOSIS — S81801A Unspecified open wound, right lower leg, initial encounter: Secondary | ICD-10-CM

## 2019-03-11 LAB — RENAL FUNCTION PANEL
Albumin: 2.3 g/dL — ABNORMAL LOW (ref 3.5–5.0)
Anion gap: 14 (ref 5–15)
BUN: 45 mg/dL — ABNORMAL HIGH (ref 8–23)
CO2: 20 mmol/L — ABNORMAL LOW (ref 22–32)
Calcium: 8.5 mg/dL — ABNORMAL LOW (ref 8.9–10.3)
Chloride: 101 mmol/L (ref 98–111)
Creatinine, Ser: 8.05 mg/dL — ABNORMAL HIGH (ref 0.44–1.00)
GFR calc Af Amer: 6 mL/min — ABNORMAL LOW (ref >60)
GFR calc non Af Amer: 5 mL/min — ABNORMAL LOW (ref >60)
Glucose, Bld: 135 mg/dL — ABNORMAL HIGH (ref 70–99)
Phosphorus: 6.8 mg/dL — ABNORMAL HIGH (ref 2.5–4.6)
Potassium: 4.4 mmol/L (ref 3.5–5.1)
Sodium: 135 mmol/L (ref 135–145)

## 2019-03-11 LAB — GLUCOSE, CAPILLARY
Glucose-Capillary: 104 mg/dL — ABNORMAL HIGH (ref 70–99)
Glucose-Capillary: 118 mg/dL — ABNORMAL HIGH (ref 70–99)
Glucose-Capillary: 121 mg/dL — ABNORMAL HIGH (ref 70–99)
Glucose-Capillary: 70 mg/dL (ref 70–99)
Glucose-Capillary: 72 mg/dL (ref 70–99)
Glucose-Capillary: 77 mg/dL (ref 70–99)
Glucose-Capillary: 97 mg/dL (ref 70–99)

## 2019-03-11 LAB — CBC
HCT: 28.9 % — ABNORMAL LOW (ref 36.0–46.0)
Hemoglobin: 9.1 g/dL — ABNORMAL LOW (ref 12.0–15.0)
MCH: 23.6 pg — ABNORMAL LOW (ref 26.0–34.0)
MCHC: 31.5 g/dL (ref 30.0–36.0)
MCV: 75.1 fL — ABNORMAL LOW (ref 80.0–100.0)
Platelets: 169 10*3/uL (ref 150–400)
RBC: 3.85 MIL/uL — ABNORMAL LOW (ref 3.87–5.11)
RDW: 17.2 % — ABNORMAL HIGH (ref 11.5–15.5)
WBC: 10.7 10*3/uL — ABNORMAL HIGH (ref 4.0–10.5)
nRBC: 0.2 % (ref 0.0–0.2)

## 2019-03-11 LAB — HEPATITIS B SURFACE ANTIBODY,QUALITATIVE: Hep B S Ab: REACTIVE

## 2019-03-11 LAB — HEPATITIS B CORE ANTIBODY, TOTAL: Hep B Core Total Ab: NEGATIVE

## 2019-03-11 MED ORDER — DEXTROSE 50 % IV SOLN
INTRAVENOUS | Status: AC
  Administered 2019-03-11: 01:00:00 25 mL
  Filled 2019-03-11: qty 50

## 2019-03-11 NOTE — Progress Notes (Signed)
PM CBG 70. Pt drowsy but easily arousable, so had her drink 8oz grape juice and crackers. Rechecked w/ midnight VS and CBG was still 70. Pt was refusing drink but did eat 1 cracker. Also gave 52mL D50 per protocol. CBG upon recheck 118.

## 2019-03-11 NOTE — Progress Notes (Signed)
Patient declined her hemodialysis treatment today.  Patient stated per the primary RN that she didn't feel well and wanted to wait and dialyze tomorrow.  Dr. Augustin Coupe notified of events.  Orders received to resume hemodialysis tomorrow.

## 2019-03-11 NOTE — Care Management Important Message (Signed)
Important Message  Patient Details  Name: Phyllis Hernandez MRN: [id number] Date of Birth: Mar 05, 1951   Medicare Important Message Given:  Yes     Shelda Altes 03/11/2019, 12:20 PM

## 2019-03-11 NOTE — Progress Notes (Signed)
  Date: 03/11/2019  Patient name: JENAH ELLINGTON  Medical record number: [id number]  Date of birth: Oct 29, 1950   I have seen and evaluated this patient and I have discussed the plan of care with the house staff. Please see their note for complete details. I concur with their findings with the following additions/corrections:   Appears tired and non-communicative today. Mostly just nods or shakes her head in response to questions. Repeated multiple times that she did not want dialysis today, but unable to engage her in further discussion about this. Unfortunately, I suspect she has some degree of uremia and is not able to fully engage in discussions of goals of care. Will continue to reassess with her.  [name], M.D., Ph.D. 03/11/2019, 3:52 PM

## 2019-03-11 NOTE — Progress Notes (Signed)
Pt refusing to go to dialysis, she is A+Ox4. MD notified. Dialysis to write orders to proceed with treatment tomorrow.

## 2019-03-11 NOTE — NC FL2 (Signed)
[place] LEVEL OF CARE SCREENING TOOL     IDENTIFICATION  Patient Name: Phyllis Hernandez Birthdate: 23-Dec-1950 Sex: female Admission Date (Current Location): 03/08/2019  [place] and [place] Number:  Phyllis Hernandez [id number] [place] and Address:  [facility] [place], [place] [address] [place], [place] [address]      Provider Number: [id number]  Attending Physician Name and Address:  Oda Kilts, MD  Relative Name and Phone Number:  Anavel Hollier; son, [phone]    Current Level of Care: Hospital Recommended Level of Care: [place] Prior Approval Number:    Date Approved/Denied:   PASRR Number: [id number]  Discharge Plan: SNF    Current Diagnoses: Patient Active Problem List   Diagnosis Date Noted  . Goals of care, counseling/discussion   . Advance care planning   . Palliative care encounter   . Hypertensive urgency 03/08/2019  . Volume overload 03/08/2019  . Closed displaced fracture of lateral malleolus of right fibula 02/09/2017  . ESRD (end stage renal disease) ([place]) 12/27/2016  . Elevated troponin 12/27/2016  . Anemia of chronic disease 12/27/2016  . Thrombocytopenia ([place]) 12/27/2016  . Constipation 12/27/2016  . Hypertensive emergency 12/09/2016  . Acute on chronic renal failure ([place]) 12/09/2016  . Type 2 diabetes mellitus with complication, without long-term current use of insulin ([place]) 02/27/2007  . Hyperlipidemia 02/27/2007  . Essential hypertension 02/27/2007    Orientation RESPIRATION BLADDER Height & Weight     Self, Time, Situation, Place  Normal Continent Weight: 219 lb 12.8 oz (99.7 kg) Height:  5\' 4"  (162.6 cm)  BEHAVIORAL SYMPTOMS/MOOD NEUROLOGICAL BOWEL NUTRITION STATUS      Continent    AMBULATORY STATUS COMMUNICATION OF NEEDS Skin   Extensive Assist Verbally Skin abrasions, Other (Comment)(cracking and skin tears on bilateral legs; venous stasis ulcers on bilateral legs)                        Personal Care Assistance Level of Assistance  Feeding, Bathing, Dressing Bathing Assistance: Maximum assistance Feeding assistance: Independent Dressing Assistance: Maximum assistance     Functional Limitations Info  Hearing, Sight, Speech Sight Info: Adequate Hearing Info: Adequate Speech Info: Adequate    SPECIAL CARE FACTORS FREQUENCY  PT (By licensed PT), OT (By licensed OT)     PT Frequency: 5x week OT Frequency: 5x week            Contractures Contractures Info: Not present    Additional Factors Info  Code Status, Allergies, Insulin Sliding Scale Code Status Info: Full Code Allergies Info: No Known Allergies   Insulin Sliding Scale Info: insulin aspart (novoLOG) injection 0-5 Units daily at bedtime; insulin aspart (novoLOG) injection 0-9 Units daily 3x with meals       Current Medications (03/11/2019):  This is the current hospital active medication list Current Facility-Administered Medications  Medication Dose Route Frequency Provider Last Rate Last Dose  . acetaminophen (TYLENOL) tablet 650 mg  650 mg Oral Q6H PRN Welford Roche, MD   650 mg at 03/09/19 0324   Or  . acetaminophen (TYLENOL) suppository 650 mg  650 mg Rectal Q6H PRN Santos-Sanchez, Idalys, MD      . amLODipine (NORVASC) tablet 10 mg  10 mg Oral Daily Roney Jaffe, MD   10 mg at 03/11/19 1404  . aspirin chewable tablet 81 mg  81 mg Oral Daily Welford Roche, MD   81 mg at 03/11/19 1403  . Chlorhexidine Gluconate Cloth 2 %  PADS 6 each  6 each Topical Q0600 Valentina Gu, NP   6 each at 03/10/19 [phone]  . Chlorhexidine Gluconate Cloth 2 % PADS 6 each  6 each Topical Q0600 Roney Jaffe, MD   6 each at 03/11/19 [phone]  . cyclobenzaprine (FLEXERIL) tablet 10 mg  10 mg Oral Daily PRN Maudie Mercury, MD   10 mg at 03/10/19 1046  . Darbepoetin Alfa (ARANESP) injection 100 mcg  100 mcg Subcutaneous Q Sun-1800 Roney Jaffe, MD   100 mcg at 03/10/19 1737  .  ezetimibe (ZETIA) tablet 10 mg  10 mg Oral Daily Maudie Mercury, MD   10 mg at 03/11/19 1403  . ferrous sulfate tablet 325 mg  325 mg Oral Q breakfast Welford Roche, MD   325 mg at 03/10/19 0940  . heparin injection 5,000 Units  5,000 Units Subcutaneous Q8H Oda Kilts, MD   5,000 Units at 03/11/19 1404  . hydrALAZINE (APRESOLINE) injection 10-20 mg  10-20 mg Intravenous Q6H PRN Roney Jaffe, MD   20 mg at 03/10/19 1046  . hydrocerin (EUCERIN) cream   Topical BID Welford Roche, MD      . HYDROmorphone (DILAUDID) injection 1-2 mg  1-2 mg Intravenous Q dialysis Roney Jaffe, MD      . insulin aspart (novoLOG) injection 0-5 Units  0-5 Units Subcutaneous QHS Santos-Sanchez, Idalys, MD      . insulin aspart (novoLOG) injection 0-9 Units  0-9 Units Subcutaneous TID WC Welford Roche, MD   1 Units at 03/10/19 1235  . metoprolol tartrate (LOPRESSOR) tablet 25 mg  25 mg Oral BID Roney Jaffe, MD   25 mg at 03/11/19 1403  . multivitamin (RENA-VIT) tablet 1 tablet  1 tablet Oral QHS Welford Roche, MD   1 tablet at 03/10/19 2159  . oxyCODONE-acetaminophen (PERCOCET/ROXICET) 5-325 MG per tablet 1-2 tablet  1-2 tablet Oral Q4H PRN Roney Jaffe, MD   2 tablet at 03/11/19 1017  . sodium chloride flush (NS) 0.9 % injection 3 mL  3 mL Intravenous Q12H Welford Roche, MD   3 mL at 03/11/19 1018     Discharge Medications: Please see discharge summary for a list of discharge medications.  Relevant Imaging Results:  Relevant Lab Results:   Additional Information SS# [place], [place]

## 2019-03-11 NOTE — TOC Initial Note (Signed)
Transition of Care [facility]) - Initial/Assessment Note    Patient Details  Name: Phyllis Hernandez MRN: [id number] Date of Birth: 31-May-1951  Transition of [facility]) CM/SW Contact:    Vinie Sill, [place] Phone Number: 03/11/2019, 3:53 PM  Clinical Narrative:                  CSW visit with the patient at bedside. Patient was alert and oriented. CSW introduced self and explained role. CSW attempted to discuss PT recommendation of Short term rehab at [facility]. Patient became tearful and declined to talk much. CSW inquired if she was feeling overwhelmed and patient shook her head in agreement. It was difficult to get patient to engage patient in disposition. CSW advised will return at another time to allow her time to process her feelings. CSW unable to completed full SNF assessment at this time.  CSW updated RN  Thurmond Butts, MSW, LCSWA Clinical Social Worker [phone]    Barriers to Discharge: Continued Medical Work up   Patient Goals and CMS Choice        Expected Discharge Plan and Services         Living arrangements for the past 2 months: Apartment                                      Prior Living Arrangements/Services Living arrangements for the past 2 months: Apartment Lives with:: Self Patient language and need for interpreter reviewed:: Yes        Need for Family Participation in Patient Care: Yes (Comment) Care giver support system in place?: Yes (comment)      Activities of Daily Living Home Assistive Devices/Equipment: Cane (specify quad or straight), CBG Meter ADL Screening (condition at time of admission) Patient's cognitive ability adequate to safely complete daily activities?: Yes Is the patient deaf or have difficulty hearing?: No Does the patient have difficulty seeing, even when wearing glasses/contacts?: No Does the patient have difficulty concentrating, remembering, or making decisions?: No Patient able to express need for  assistance with ADLs?: Yes Does the patient have difficulty dressing or bathing?: Yes Independently performs ADLs?: Yes (appropriate for developmental age) Does the patient have difficulty walking or climbing stairs?: Yes Weakness of Legs: Both Weakness of Arms/Hands: Both  Permission Sought/Granted Permission sought to share information with : Family Supports Permission granted to share information with : Yes, Verbal Permission Granted  Share Information with NAME: Phyllis Hernandez     Permission granted to share info w Relationship: son  Permission granted to share info w Contact Information: [phone]  Emotional Assessment Appearance:: Appears stated age Attitude/Demeanor/Rapport: Crying Affect (typically observed): Overwhelmed, Sad, Tearful/Crying Orientation: : Oriented to Self, Oriented to Place, Oriented to  Time, Oriented to Situation Alcohol / Substance Use: Not Applicable Psych Involvement: No (comment)  Admission diagnosis:  Acute renal failure, unspecified acute renal failure type [facility]) [N17.9] Patient Active Problem List   Diagnosis Date Noted  . Goals of care, counseling/discussion   . Advance care planning   . Palliative care encounter   . Hypertensive urgency 03/08/2019  . Volume overload 03/08/2019  . Closed displaced fracture of lateral malleolus of right fibula 02/09/2017  . ESRD (end stage renal disease) ([place]) 12/27/2016  . Elevated troponin 12/27/2016  . Anemia of chronic disease 12/27/2016  . Thrombocytopenia ([place]) 12/27/2016  . Constipation 12/27/2016  . Hypertensive emergency 12/09/2016  . Acute on chronic  renal failure ([place]) 12/09/2016  . Type 2 diabetes mellitus with complication, without long-term current use of insulin ([place]) 02/27/2007  . Hyperlipidemia 02/27/2007  . Essential hypertension 02/27/2007   PCP:  Patient, No Pcp Per Pharmacy:   [facility] [place], [place] [address] Phone: [phone] Fax: [phone]     Social Determinants of Health (SDOH) Interventions    Readmission Risk Interventions No flowsheet data found.

## 2019-03-11 NOTE — Progress Notes (Signed)
Responded to spiritual care consult for an AD. PT was sitting and resting on her chair.Upon entering I asked how Phyllis Hernandez was doing and she responded with a mumble. It was a little difficult to understand her. I noticed she had a copy of the AD on the desk. She said her family is coming to visit today, but doesn't know what time. She asked if we can check in on her later in the day.  I checked in with Phyllis Hernandez's Nurse. She stated that she is scheduled for dialysis and that it will take around four hours. She also said that her husband is coming and not sure when a good time to consult for AD will be best. She stated that she will put in another consult for an AD when she feels would be a better time to visit.   Palliative care Resident Chaplain  Fidel Levy

## 2019-03-11 NOTE — Progress Notes (Signed)
   Subjective:  Phyllis Hernandez was seen at bedside this morning. She was asleep but was easily aroused by verbal stimuli. She states that she stopped dialysis early yesterday due continued leg pain. She states that she is not looking forward to receiving dialysis today because of her leg pain. She would fall asleep during the interview, and be awoken by verbal stimuli. She did speak with palliative care yesterday regarding her poor adherence with her dialysis sessions. All questions and concerns were addressed.   Objective: Vital signs in last 24 hours: Vitals:   03/10/19 1841 03/10/19 2103 03/11/19 0012 03/11/19 0405  BP: (!) 150/103 (!) 144/68 (!) 155/81 137/73  Pulse: 90 97 75 75  Resp: 20 19 15 18   Temp: 98.4 F (36.9 C) 98.4 F (36.9 C) 98.4 F (36.9 C) 98.1 F (36.7 C)  TempSrc: Oral Oral Oral Oral  SpO2: 99% 94% 96% 96%  Weight:    99.7 kg  Height:      Physical Exam Vitals signs and nursing note reviewed.  Constitutional:      General: She is not in acute distress.    Appearance: She is obese. She is not ill-appearing or toxic-appearing.     Comments: Drowsy elderly female, awoken repeatedly by verbal stimuli.   HENT:     Head: Normocephalic and atraumatic.  Cardiovascular:     Rate and Rhythm: Normal rate and regular rhythm.     Pulses: Normal pulses.     Heart sounds: Normal heart sounds. No murmur. No friction rub. No gallop.   Pulmonary:     Effort: Pulmonary effort is normal.     Breath sounds: Normal breath sounds. No wheezing, rhonchi or rales.  Abdominal:     General: Bowel sounds are normal.     Palpations: Abdomen is soft.    Assessment/Plan:  Principal Problem:   Volume overload Active Problems:   Type 2 diabetes mellitus with complication, without long-term current use of insulin ([facility])   ESRD (end stage renal disease) ([facility])   Anemia of chronic disease   Hypertensive urgency   Goals of care, counseling/discussion   Advance care planning   Palliative  care encounter  Volume Overload 2/2 to ESRD: - 2/2 to missing/dismissal from HD sessions. 2018 Echo shows EF 60%, no evidence of valvular stenosis or regurgitation, no wall motion abnormalities, but mild left ventricular hypertrophy. - Palliative care spoke with Mr. Burrous regarding her poor compliance with dialysis session. She states that she will try to do better with her dialysis session, but has failed to complete a session while on our service. Palliative states that she is married, but would like her son to have PoA, and was given the paperwork for that.   - Dialysis session 03/10/2019 halted due to leg pain.  - Dialysis session scheduled for today.   Hypertensive Urgency: - Systolic pressures are in the 118-157 range.  - Continue Amlodipine 10 mg QD - Continue Lopressor 25 mg BID  - Continue Hydralazine PRN Q6H   Bilateral LE wounds:  - Continue Eucerin. - continue changing wrappings as needed.   T2DM:  - Nonadherent to home diabetes medications - SSI  - Last CBG: 150   HLD:  - Continue Zetia 10 mg  Dispo: Anticipated discharge pending medical course.    Phyllis Mercury, MD 03/11/2019, 12:24 PM Pager: [phone]

## 2019-03-12 LAB — CBC
HCT: 24.3 % — ABNORMAL LOW (ref 36.0–46.0)
Hemoglobin: 7.5 g/dL — ABNORMAL LOW (ref 12.0–15.0)
MCH: 23.1 pg — ABNORMAL LOW (ref 26.0–34.0)
MCHC: 30.9 g/dL (ref 30.0–36.0)
MCV: 75 fL — ABNORMAL LOW (ref 80.0–100.0)
Platelets: 178 10*3/uL (ref 150–400)
RBC: 3.24 MIL/uL — ABNORMAL LOW (ref 3.87–5.11)
RDW: 16.8 % — ABNORMAL HIGH (ref 11.5–15.5)
WBC: 9.7 10*3/uL (ref 4.0–10.5)
nRBC: 0.3 % — ABNORMAL HIGH (ref 0.0–0.2)

## 2019-03-12 LAB — RENAL FUNCTION PANEL
Albumin: 2.1 g/dL — ABNORMAL LOW (ref 3.5–5.0)
Anion gap: 14 (ref 5–15)
BUN: 47 mg/dL — ABNORMAL HIGH (ref 8–23)
CO2: 20 mmol/L — ABNORMAL LOW (ref 22–32)
Calcium: 8 mg/dL — ABNORMAL LOW (ref 8.9–10.3)
Chloride: 102 mmol/L (ref 98–111)
Creatinine, Ser: 8.67 mg/dL — ABNORMAL HIGH (ref 0.44–1.00)
GFR calc Af Amer: 5 mL/min — ABNORMAL LOW (ref >60)
GFR calc non Af Amer: 4 mL/min — ABNORMAL LOW (ref >60)
Glucose, Bld: 100 mg/dL — ABNORMAL HIGH (ref 70–99)
Phosphorus: 7.4 mg/dL — ABNORMAL HIGH (ref 2.5–4.6)
Potassium: 4.4 mmol/L (ref 3.5–5.1)
Sodium: 136 mmol/L (ref 135–145)

## 2019-03-12 LAB — GLUCOSE, CAPILLARY
Glucose-Capillary: 98 mg/dL (ref 70–99)
Glucose-Capillary: 81 mg/dL (ref 70–99)
Glucose-Capillary: 93 mg/dL (ref 70–99)
Glucose-Capillary: 93 mg/dL (ref 70–99)

## 2019-03-12 MED ORDER — SODIUM CHLORIDE 0.9 % IV SOLN: 100.0000 mL | INTRAVENOUS | Status: DC | PRN

## 2019-03-12 MED ORDER — SEVELAMER CARBONATE 800 MG PO TABS
800.0000 mg | ORAL_TABLET | Freq: Three times a day (TID) | ORAL | Status: DC
  Administered 2019-03-15 – 2019-03-18 (×4): 800 mg via ORAL
  Filled 2019-03-12 (×6): qty 1

## 2019-03-12 MED ORDER — PENTAFLUOROPROP-TETRAFLUOROETH EX AERO: 1.0000 "application " | INHALATION_SPRAY | CUTANEOUS | Status: DC | PRN

## 2019-03-12 MED ORDER — LIDOCAINE HCL (PF) 1 % IJ SOLN: 5.0000 mL | INTRAMUSCULAR | Status: DC | PRN

## 2019-03-12 MED ORDER — LIDOCAINE-PRILOCAINE 2.5-2.5 % EX CREA: 1.0000 "application " | TOPICAL_CREAM | CUTANEOUS | Status: DC | PRN

## 2019-03-12 MED ORDER — HEPARIN SODIUM (PORCINE) 1000 UNIT/ML IJ SOLN
INTRAMUSCULAR | Status: AC
  Filled 2019-03-12: qty 4

## 2019-03-12 MED ORDER — HEPARIN SODIUM (PORCINE) 1000 UNIT/ML DIALYSIS: 1000.0000 [IU] | INTRAMUSCULAR | Status: DC | PRN

## 2019-03-12 MED ORDER — ALTEPLASE 2 MG IJ SOLR: 2.0000 mg | Freq: Once | INTRAMUSCULAR | Status: DC | PRN

## 2019-03-12 NOTE — Progress Notes (Addendum)
   Subjective:  Phyllis Hernandez was seen in the dialysis unit this morning receiving dialysis. She was easily woken verbal stimuli, but would quickly fall back asleep and was non-communitive. She was unable to participate in the interview.   Objective: Vital signs in last 24 hours: Vitals:   03/12/19 0900 03/12/19 0930 03/12/19 1000 03/12/19 1014  BP: (!) 146/28 (!) 178/30 (!) 136/31 (!) 142/59  Pulse: 86 86 82 87  Resp: 17 16 16  (!) 22  Temp:    (P) 98.3 F (36.8 C)  TempSrc:    Oral  SpO2:    96%  Weight:    (P) 94.7 kg  Height:       Physical Exam Vitals signs and nursing note reviewed.  Constitutional:      Appearance: She is not ill-appearing, toxic-appearing or diaphoretic.     Comments: Appears very tired, non-commutative.  HENT:     Head: Normocephalic and atraumatic.  Cardiovascular:     Rate and Rhythm: Normal rate and regular rhythm.     Pulses: Normal pulses.     Heart sounds: No murmur. No friction rub. No gallop.   Pulmonary:     Effort: Pulmonary effort is normal. No respiratory distress.     Breath sounds: Normal breath sounds. No wheezing, rhonchi or rales.  Abdominal:     General: Bowel sounds are normal.     Palpations: Abdomen is soft.  Musculoskeletal:     Right lower leg: No edema.     Left lower leg: No edema.  Skin:    Comments: Bilateral lower extremity dressings appear clean with no signs of purulent or serosanguinous discharge.      Assessment/Plan:  Principal Problem:   Volume overload Active Problems:   Type 2 diabetes mellitus with complication, without long-term current use of insulin (HCC)   ESRD (end stage renal disease) (HCC)   Anemia of chronic disease   Hypertensive urgency   Goals of care, counseling/discussion   Advance care planning   Palliative care encounter  Volume Overload 2/2 to ESRD: - 2/2 to missing/dismissal from HD sessions. 2018 Echo shows EF 60%, no evidence of valvular stenosis or regurgitation, no wall motion  abnormalities, but mild left ventricular hypertrophy. - Palliative consulting with goals of care. We appreciate their assistance with Phyllis Hernandez's care. - Currently completing a dialysis session this morning.   Hypertensive Urgency: - Systolic pressures labile this morning. Her pressures are ranging from 107/67- 177/32. - Continue Amlodipine 10 mg QD - Continue Lopressor 25 mg BID  - Continue Hydralazine PRN Q6H   Bilateral LE wounds:  - Continue Eucerin. - continue changing wrappings as needed. - Continue Dilaudid 1-2 mg IV every dialysis session - Continue Percocet/Roxicet Q4H PRN   T2DM:  - Nonadherent to home diabetes medications - SSI  - Last CBG: 98   HLD:  - Continue Zetia 10 mg  Dispo: Anticipated discharge pending medical course.    Maudie Mercury, MD 03/12/2019, 11:07 AM Pager: [phone]

## 2019-03-12 NOTE — Progress Notes (Signed)
Pt return back to room 4E19 in bed by transport. Pt has eyes closed. Assessment done. Pt disoriented x 4. Not speaking, not following commands. Pt does withdraw to pain. Pt does have non purposeful movement to upper extremities. Dialysis RN Dain RN called and reassured this RN that neuro assessment is the same as when she had her. Will continue to monitor.

## 2019-03-12 NOTE — Progress Notes (Signed)
Physical Therapy Treatment Patient Details Name: Phyllis Hernandez SEARCH MRN: [id number] DOB: 1951/03/08 Today's Date: 03/12/2019    History of Present Illness Pt is a 68 y.o. female admitted 03/08/19 with volume overload and hypertensive overload after missing HD sessions (per chart, has missed HD for approximately one year); pt opting to resume HD while admitted, but unable to tolerate complete session due to BLE pain. PMH includes ESRD (on HD), DM2, HTN, depression.    PT Comments    Pt with noted cognitive and functional decline today compared to Sunday. On initial exam 2 days ago pt was minA for bed mobility and to transfer OOB to chair. TODAY pt non-verbal, lethargic, only opens eyes to name and requires maxAx2 for supine to sit and to return back to bed. Pt with no command follow and is non-verbal. Pt did not open mouth when cookie or straw was placed near. Pt sat EOB x 5 min with modA. Spouse present and very tearful as this is very different from just a couple of days ago. RN aware and he reports MD was notified. Acute PT to cont to follow.   Follow Up Recommendations  SNF;Supervision/Assistance - 24 hour     Equipment Recommendations       Recommendations for Other Services       Precautions / Restrictions Precautions Precautions: Fall Precaution Comments: Bilateral feet/lower leg wounds wrapped in ace wrap, watch BP Restrictions Weight Bearing Restrictions: No    Mobility  Bed Mobility Overal bed mobility: Needs Assistance Bed Mobility: Supine to Sit;Sit to Supine     Supine to sit: Max assist;+2 for physical assistance Sit to supine: Max assist;+2 for physical assistance   General bed mobility comments: pt with no initiation of task or physical assist during transfer to EOB, pt with no effort only grimacing during the transfer  Transfers                 General transfer comment: unable to stand today  Ambulation/Gait             General Gait Details:  unable   Stairs             Wheelchair Mobility    Modified Rankin (Stroke Patients Only)       Balance Overall balance assessment: Needs assistance Sitting-balance support: Single extremity supported Sitting balance-Leahy Scale: Poor Sitting balance - Comments: pt requiring min/modA to maintain EOB blance Postural control: Right lateral lean                                  Cognition Arousal/Alertness: Lethargic Behavior During Therapy: Flat affect Overall Cognitive Status: Impaired/Different from baseline Area of Impairment: Orientation;Attention;Following commands;Problem solving                 Orientation Level: Disoriented to;Place;Time;Situation Current Attention Level: Focused   Following Commands: Follows one step commands inconsistently     Problem Solving: Slow processing;Decreased initiation;Difficulty sequencing;Requires verbal cues;Requires tactile cues General Comments: pt with no command follow today, no initiation of task asked. pt opened eyes to name only, no verbalization      Exercises      General Comments General comments (skin integrity, edema, etc.): pt with bilat unna boots on LEs, pt with noted edema t/o bilat LEs.       Pertinent Vitals/Pain Pain Assessment: Faces Faces Pain Scale: Hurts a little bit Pain Location: grimacing with transfer to EOB  Pain Descriptors / Indicators: Grimacing Pain Intervention(s): Monitored during session    Home Living                      Prior Function            PT Goals (current goals can now be found in the care plan section) Progress towards PT goals: Not progressing toward goals - comment(pt with noted cognitive and functional regression)    Frequency    Min 2X/week      PT Plan Current plan remains appropriate    Co-evaluation              AM-PAC PT "6 Clicks" Mobility   Outcome Measure  Help needed turning from your back to your side while  in a flat bed without using bedrails?: Total Help needed moving from lying on your back to sitting on the side of a flat bed without using bedrails?: Total Help needed moving to and from a bed to a chair (including a wheelchair)?: Total Help needed standing up from a chair using your arms (e.g., wheelchair or bedside chair)?: Total Help needed to walk in hospital room?: Total Help needed climbing 3-5 steps with a railing? : Total 6 Click Score: 6    End of Session   Activity Tolerance: Patient limited by fatigue Patient left: in bed;with call bell/phone within reach;with family/visitor present Nurse Communication: Mobility status PT Visit Diagnosis: Other abnormalities of gait and mobility (R26.89);Muscle weakness (generalized) (M62.81);History of falling (Z91.81)     Time: [id number] PT Time Calculation (min) (ACUTE ONLY): 20 min  Charges:  $Therapeutic Activity: 8-22 mins                     Kittie Plater, PT, DPT Acute Rehabilitation Services Pager #: [phone] Office #: [phone]    Berline Lopes 03/12/2019, 1:42 PM

## 2019-03-12 NOTE — Progress Notes (Signed)
Pt husband Tazhane Dietrich [phone] at bedside tearful. States "this is a change from 2-3 days ago". Dr. Gilford Rile notified and to call husband once he can. Will continue to monitor.

## 2019-03-12 NOTE — Progress Notes (Signed)
  Date: 03/12/2019  Patient name: Phyllis Hernandez  Medical record number: [id number]  Date of birth: 09-Oct-1950   I have seen and evaluated this patient and I have discussed the plan of care with the house staff. Please see their note for complete details. I concur with their findings with the following additions/corrections:   Still somnolent and minimally interactive, but was able to go for dialysis today. Will continue to dialyze as much as we are able and hopefully pursue more extensive [place] discussion when she is less encephalopathic from uremia.  [name], M.D., Ph.D. 03/12/2019, 11:25 AM

## 2019-03-12 NOTE — Progress Notes (Signed)
Paged [place] with dialysis and asked once more of neuro exam of pt. Again was reassured that pt exam unchanged from time pt was brought to do dialysis. Will continue to monitor.

## 2019-03-12 NOTE — Progress Notes (Signed)
[facility] Progress Note    Assessment/ Plan:   1. ESRD - Started HD 12/2016 and went through 02/21/2018 (last HD). Did not return to dialysis and was therefore discharged by [facility] for severe noncompliance. Pt does not currently have an OP HD unit. Not sure that patient will agree to long-term dialysis, she has agreed to trying in-hospital dialysis.  Will consult palliative care for [place].  She c/o severe leg pains on HD which for now at least will try to control w/ IV/ po pain meds. 1st HD on Friday pt only stayed on machine 1 hour , poor UF 600 cc, signed off due to pain.  2nd HD Sunday then refused HD Monday.  - Unclear if she wants to do long-term; will re-CLIP if she changes her mind.   2. Hypertensive/ volume : Norvasc 10mg , IV prn and metoprolol 25mg  BID. Sig vol overload, aggressive UF.  - Will try to HD again tomorrow to help with the vol overload. 3. Anemia - HGB 8.7.Give ESA with HD today. Order iron panel. 4. Metabolic bone disease - Check phos/PTH. Resume binders/VDRA when labs available. Ca 8.2.P 6.8 -> renvela 800mg  TIDM (start 9/22) 5. Nutrition - Renal/Carb mod diet.  6. Wounds bilateral LE-Wound consult. Lower volume.  Subjective:   Denies f/c/n/v but not very cooperative. Have to work to keep her attention.   Objective:   BP 107/69 (BP Location: Right Arm)   Pulse 69   Temp 98.5 F (36.9 C) (Oral)   Resp 16   Ht 5\' 4"  (1.626 m)   Wt 99.7 kg   SpO2 96%   BMI 37.73 kg/m   Intake/Output Summary (Last 24 hours) at 03/12/2019 [id number] Last data filed at 03/12/2019 [id number] Gross per 24 hour  Intake 232 ml  Output 300 ml  Net -68 ml   Weight change:   Physical Exam: General:Chronically ill appearing older femalein no acute distress, up in chair and sleeping Neck: Supple. JVD1/2 to mandible Lungs:Few crackles L base, otherwise CTAB Heart:HS distant, S1,S2. RRR. Abdomen:Obese, active BS,probable ascites present M-S: Strength and tone  appear normal for age. Lower extremities:Woody appearance BLE with open blisters on BLE. 2+ BLE edema. Neuro: Alert and oriented X 3. Moves all ext, gen weak and no asterixis or confusion Dialysis Access:RIJ TDC  Imaging: No results found.  Labs: BMET Recent Labs  Lab 03/08/19 0538 03/09/19 0402 03/09/19 0711 03/10/19 1239 03/11/19 1702  NA 137 136  --  136 135  K 5.5* 4.5  --  4.8 4.4  CL 109 106  --  106 101  CO2 9* 16*  --  13* 20*  GLUCOSE 144* 129*  --  133* 135*  BUN 93* 76*  --  77* 45*  CREATININE 12.96* 10.33*  --  11.10* 8.05*  CALCIUM 8.2* 7.8* 7.9* 7.9* 8.5*  PHOS  --  6.9*  --  7.5* 6.8*   CBC Recent Labs  Lab 03/08/19 0538 03/09/19 0405 03/10/19 1239 03/11/19 1702  WBC 7.5 6.5 7.6 10.7*  HGB 8.7* 7.5* 8.3* 9.1*  HCT 27.8* 24.3* 26.7* 28.9*  MCV 74.5* 74.5* 75.2* 75.1*  PLT 192 163 161 169    Medications:    . amLODipine  10 mg Oral Daily  . aspirin  81 mg Oral Daily  . Chlorhexidine Gluconate Cloth  6 each Topical Q0600  . Chlorhexidine Gluconate Cloth  6 each Topical Q0600  . darbepoetin (ARANESP) injection - NON-DIALYSIS  100 mcg Subcutaneous Q Sun-1800  .  ezetimibe  10 mg Oral Daily  . ferrous sulfate  325 mg Oral Q breakfast  . heparin  5,000 Units Subcutaneous Q8H  . hydrocerin   Topical BID  . insulin aspart  0-5 Units Subcutaneous QHS  . insulin aspart  0-9 Units Subcutaneous TID WC  . metoprolol tartrate  25 mg Oral BID  . multivitamin  1 tablet Oral QHS  . sodium chloride flush  3 mL Intravenous Q12H      [name], MD 03/12/2019, 6:52 AM

## 2019-03-12 NOTE — TOC Progression Note (Signed)
Transition of Care [facility]) - Progression Note    Patient Details  Name: QUNISHA HARPSTER MRN: [id number] Date of Birth: [birth year]  [facility] CM/SW [place], [place] Phone Number: 03/12/2019, 2:40 PM  Clinical Narrative:     CSW visit with the patient and her spouse, [phone] at bedside. Patient was not alert and did not participate in conversation. CSW introduced self to patient's spouse and explained role. Patient's spouse was visibly upset and concern about the patient's progress. He was informed CSW will continue to follow and assist as needed.   Thurmond Butts, MSW, LCSWA Clinical Social Worker [phone]     Barriers to Discharge: Continued Medical Work up  Expected Discharge Plan and Services         Living arrangements for the past 2 months: Apartment                                       Social Determinants of Health (SDOH) Interventions    Readmission Risk Interventions No flowsheet data found.

## 2019-03-13 ENCOUNTER — Inpatient Hospital Stay (HOSPITAL_COMMUNITY): Payer: Medicare Other

## 2019-03-13 DIAGNOSIS — R509 Fever, unspecified: Secondary | ICD-10-CM

## 2019-03-13 DIAGNOSIS — R410 Disorientation, unspecified: Secondary | ICD-10-CM

## 2019-03-13 LAB — GLUCOSE, CAPILLARY
Glucose-Capillary: 108 mg/dL — ABNORMAL HIGH (ref 70–99)
Glucose-Capillary: 104 mg/dL — ABNORMAL HIGH (ref 70–99)
Glucose-Capillary: 91 mg/dL (ref 70–99)
Glucose-Capillary: 86 mg/dL (ref 70–99)

## 2019-03-13 LAB — CBC WITH DIFFERENTIAL/PLATELET
Abs Immature Granulocytes: 0.12 10*3/uL — ABNORMAL HIGH (ref 0.00–0.07)
Basophils Absolute: 0 10*3/uL (ref 0.0–0.1)
Basophils Relative: 0 %
Eosinophils Absolute: 0 10*3/uL (ref 0.0–0.5)
Eosinophils Relative: 0 %
HCT: 27.7 % — ABNORMAL LOW (ref 36.0–46.0)
Hemoglobin: 8.7 g/dL — ABNORMAL LOW (ref 12.0–15.0)
Immature Granulocytes: 1 %
Lymphocytes Relative: 4 %
Lymphs Abs: 0.6 10*3/uL — ABNORMAL LOW (ref 0.7–4.0)
MCH: 23.6 pg — ABNORMAL LOW (ref 26.0–34.0)
MCHC: 31.4 g/dL (ref 30.0–36.0)
MCV: 75.3 fL — ABNORMAL LOW (ref 80.0–100.0)
Monocytes Absolute: 1.2 10*3/uL — ABNORMAL HIGH (ref 0.1–1.0)
Monocytes Relative: 8 %
Neutro Abs: 12 10*3/uL — ABNORMAL HIGH (ref 1.7–7.7)
Neutrophils Relative %: 87 %
Platelets: 191 10*3/uL (ref 150–400)
RBC: 3.68 MIL/uL — ABNORMAL LOW (ref 3.87–5.11)
RDW: 16.8 % — ABNORMAL HIGH (ref 11.5–15.5)
WBC: 13.9 10*3/uL — ABNORMAL HIGH (ref 4.0–10.5)
nRBC: 0.2 % (ref 0.0–0.2)

## 2019-03-13 LAB — LACTIC ACID, PLASMA
Lactic Acid, Venous: 1.4 mmol/L (ref 0.5–1.9)
Lactic Acid, Venous: 1.3 mmol/L (ref 0.5–1.9)

## 2019-03-13 LAB — URINALYSIS, ROUTINE W REFLEX MICROSCOPIC
Bacteria, UA: NONE SEEN
Bilirubin Urine: NEGATIVE
Glucose, UA: 50 mg/dL — AB
Hgb urine dipstick: NEGATIVE
Ketones, ur: 5 mg/dL — AB
Leukocytes,Ua: NEGATIVE
Nitrite: NEGATIVE
Protein, ur: >=300 mg/dL — AB
Specific Gravity, Urine: 1.013 (ref 1.005–1.030)
pH: 7 (ref 5.0–8.0)

## 2019-03-13 LAB — CULTURE, BLOOD (ROUTINE X 2)
Culture: NO GROWTH
Culture: NO GROWTH

## 2019-03-13 MED ORDER — VANCOMYCIN HCL IN DEXTROSE 1-5 GM/200ML-% IV SOLN
1000.0000 mg | INTRAVENOUS | Status: DC
  Filled 2019-03-13: qty 200

## 2019-03-13 MED ORDER — PIPERACILLIN-TAZOBACTAM 3.375 G IVPB
3.3750 g | Freq: Two times a day (BID) | INTRAVENOUS | Status: DC
  Administered 2019-03-13 – 2019-03-16 (×6): 3.375 g via INTRAVENOUS
  Filled 2019-03-13 (×6): qty 50

## 2019-03-13 MED ORDER — VANCOMYCIN HCL 10 G IV SOLR
2000.0000 mg | Freq: Once | INTRAVENOUS | Status: AC
  Administered 2019-03-13: 2000 mg via INTRAVENOUS
  Filled 2019-03-13: qty 2000

## 2019-03-13 MED ORDER — PIPERACILLIN-TAZOBACTAM 3.375 G IVPB 30 MIN
3.3750 g | Freq: Once | INTRAVENOUS | Status: AC
  Administered 2019-03-13: 3.375 g via INTRAVENOUS
  Filled 2019-03-13: qty 50

## 2019-03-13 NOTE — Progress Notes (Signed)
Subjective:  Phyllis Hernandez and her husband were seen at bedside today. We spoke with her husband about her medical course. She was awake and said that she slept well last night. We explained to her about her new fever last night, and that we are currently working toward finding a source. They voiced understanding. All questions and concerns were addressed.   Objective: Vital signs in last 24 hours: Vitals:   03/13/19 0546 03/13/19 0547 03/13/19 0815 03/13/19 1129  BP:   (!) 112/44 90/78  Pulse: 90 85 71 60  Resp:   18 (!) 23  Temp: 98.1 F (36.7 C) 98.1 F (36.7 C) 98.2 F (36.8 C) 98.4 F (36.9 C)  TempSrc: Oral Axillary Axillary Axillary  SpO2: 99% 95% 97% 98%  Weight:      Height:       Physical Exam Vitals signs and nursing note reviewed.  Constitutional:      Appearance: She is obese. She is ill-appearing.     Comments: Lethargic female, arousable to verbal stimuli, but quickly falls back to sleep.   HENT:     Head: Normocephalic and atraumatic.  Cardiovascular:     Rate and Rhythm: Normal rate and regular rhythm.     Pulses: Normal pulses.     Heart sounds: Normal heart sounds. No murmur.  Pulmonary:     Effort: Pulmonary effort is normal.     Breath sounds: Normal breath sounds. No wheezing, rhonchi or rales.  Abdominal:     General: Bowel sounds are normal.     Palpations: Abdomen is soft.  Musculoskeletal:     Comments: Bilateral lower extremities are appropriately wrapped and in dry boots. No signs or pustulant or serosanguinous discharge from the bandage sites.     Skin:    Comments: Bilateral lower extremeties are cool to the touch, with dry skin.   Neurological:     Comments: Lethargic, answers basic questions with 2-3 words. Constant verbal stimulation to keep attentive.     Assessment/Plan:  Principal Problem:   Volume overload Active Problems:   Type 2 diabetes mellitus with complication, without long-term current use of insulin (HCC)   ESRD (end  stage renal disease) (HCC)   Anemia of chronic disease   Hypertensive urgency   Goals of care, counseling/discussion   Advance care planning   Palliative care encounter  Volume Overload 2/2 to ESRD: - 2/2 to missing/dismissal from HD sessions. 2018 Echo shows EF 60%, no evidence of valvular stenosis or regurgitation, no wall motion abnormalities, but mild left ventricular hypertrophy. - Completed dialysis 03/12/2019.   Fever of Unknown Origin:   - Overnight patient had a 101.9 fever. She appeared lethargic, and there was no clear evidence of infection. Given that she is a diabetic with ESRD and has bilaterally leg wounds that are regularly changed, she was started on Vanc and Zosyn to cover for her possible infectious predispositions/sources.   - Bladder scan showed 890 mL of fluid. In and out catheter was ordered with a U/A. - Has not had a BM since admission, soap sud enema ordered.  - Lactic acid ordered. - CBC this morning showed leukocytosis with a WBC of 13.9.  Hypertensive Urgency: - Systolic pressures range from (101-140)/(49-84) - Continue Amlodipine 10 mg QD - Continue Lopressor 25 mg BID  - Continue Hydralazine PRN Q6H   Bilateral LE wounds:  - Continue Eucerin. - continue changing wrappings as needed. - Continue Dilaudid 1-2 mg IV every dialysis session - Continue  Percocet/Roxicet Q4H PRN   T2DM:  - Nonadherent to home diabetes medications - SSI  - Last CBG: 98   HLD:  - Continue Zetia 10 mg  Dispo: Anticipated discharge pending medical course.    Maudie Mercury, MD 03/13/2019, 1:59 PM Pager: [phone]

## 2019-03-13 NOTE — Progress Notes (Signed)
Pharmacy Antibiotic Note  Phyllis Hernandez is a 68 y.o. female admitted on 03/08/2019 for weakness and SOB, now with new fever and AMS, also bilateral LE wounds.  Pharmacy has been consulted for vancomycin and zosyn dosing.  ESRD - HD with last on 9/22, however has declined previously  Plan: Vancomycin 2000mg  IV x1 Zosyn 3.375g IV x1 F/u HD plan for scheduled doses, Cx and clinical progression to narrow Vancomycin level as needed  Height: 5\' 4"  (162.6 cm) Weight: 205 lb 4 oz (93.1 kg) IBW/kg (Calculated) : 54.7  Temp (24hrs), Avg:99.8 F (37.7 C), Min:98.3 F (36.8 C), Max:101.9 F (38.8 C)  Recent Labs  Lab 03/08/19 0538 03/09/19 0402 03/09/19 0405 03/10/19 1239 03/11/19 1702 03/12/19 0813  WBC 7.5  --  6.5 7.6 10.7* 9.7  CREATININE 12.96* 10.33*  --  11.10* 8.05* 8.67*    Estimated Creatinine Clearance: 7.2 mL/min (A) (by C-G formula based on SCr of 8.67 mg/dL (H)).    Allergies  Allergen Reactions  . No Known Allergies     Bertis Ruddy, PharmD Clinical Pharmacist Please check AMION for all [place] numbers 03/13/2019 5:46 AM

## 2019-03-13 NOTE — Progress Notes (Signed)
Occupational Therapy Treatment Patient Details Name: Phyllis Hernandez MRN: [id number] DOB: 1951/02/17 Today's Date: 03/13/2019    History of present illness Pt is a 68 y.o. female admitted 03/08/19 with volume overload and hypertensive overload after missing HD sessions (per chart, has missed HD for approximately one year); pt opting to resume HD while admitted, but unable to tolerate complete session due to BLE pain. PMH includes ESRD (on HD), DM2, HTN, depression.   OT comments  Pt not progressing toward goals, noted to have cognitive and functional decline this date, although husband reports pt is having a better day today than yesterday. Pt currently non-verbal unless responding to noxious stimuli, extremely lethargic requiring maxA to open her eyes. Pt required maxA+2 for bed mobility, minA-modA for sitting balance EOB, pt with increased weakness in RUE, unable to hold cup or spoon. RN aware of pt's deficits. Pt took two bites of meal with assist from therapist, required max cues to chew and swallow. MD and RN arrived at end of session. Pt will continue to benefit from skilled OT services to maximize safety and independence with ADL/IADL and functional mobility. Will continue to follow acutely and progress as tolerated.    Follow Up Recommendations  Supervision/Assistance - 24 hour;SNF    Equipment Recommendations  3 in 1 bedside commode    Recommendations for Other Services      Precautions / Restrictions Precautions Precautions: Fall Precaution Comments: Bilateral feet/lower leg wounds wrapped in ace wrap, watch BP Restrictions Weight Bearing Restrictions: No       Mobility Bed Mobility Overal bed mobility: Needs Assistance Bed Mobility: Supine to Sit;Sit to Supine     Supine to sit: Max assist;+2 for physical assistance Sit to supine: Max assist;+2 for physical assistance   General bed mobility comments: pt with no initiation of task or physical assist during transfer to  EOB, pt with no effort only grimacing during the transfer  Transfers                 General transfer comment: unable to safely attempt    Balance Overall balance assessment: Needs assistance Sitting-balance support: Single extremity supported;Feet supported Sitting balance-Leahy Scale: Poor Sitting balance - Comments: pt requiring min/modA to maintain EOB blance Postural control: Posterior lean;Right lateral lean                                 ADL either performed or assessed with clinical judgement   ADL Overall ADL's : Needs assistance/impaired Eating/Feeding: Maximal assistance;Sitting Eating/Feeding Details (indicate cue type and reason): pt took 2 bites of meal while sitting upright;pt unable to hold spoon or feed self, required assistance from therapist;pt appeared to be holding food in her mouth, required max cues to chew and swallow;pt able to hold cup with LUE and bring to mouth Grooming: Maximal assistance   Upper Body Bathing: Total assistance   Lower Body Bathing: Total assistance   Upper Body Dressing : Total assistance   Lower Body Dressing: Total assistance   Toilet Transfer: Total assistance   Toileting- Clothing Manipulation and Hygiene: Total assistance       Functional mobility during ADLs: Maximal assistance;+2 for physical assistance;Total assistance General ADL Comments: pt with limited engagement during session;sat EOB for about 10 min     Vision       Perception     Praxis      Cognition Arousal/Alertness: Lethargic Behavior During Therapy: Flat  affect Overall Cognitive Status: Impaired/Different from baseline Area of Impairment: Attention;Following commands;Awareness                   Current Attention Level: Focused   Following Commands: Follows one step commands inconsistently   Awareness: Emergent   General Comments: pt with max vc to keep eyes open during session;husband reports pt appears more  lethargic this date;pt verbal with noxious stimuli, but did not consistently respond during session;        Exercises     Shoulder Instructions       General Comments pt with bilateral unna boots on BLE;pt with noted edema and darker pigmented skin, husband reports "Ive never seen anyone's feet that dark, it scared me"    Pertinent Vitals/ Pain       Pain Assessment: Faces Faces Pain Scale: Hurts even more Pain Location: grimacing with movement of LLE;pt yelled out when therapist moved LE "quit moving it like that" Pain Descriptors / Indicators: Grimacing Pain Intervention(s): Monitored during session;Limited activity within patient's tolerance;Repositioned  Home Living                                          Prior Functioning/Environment              Frequency  Min 2X/week        Progress Toward Goals  OT Goals(current goals can now be found in the care plan section)  Progress towards OT goals: Not progressing toward goals - comment(pt deconditioned, limited participation during session)  Acute Rehab OT Goals Patient Stated Goal: pt did not state OT Goal Formulation: With patient Time For Goal Achievement: 03/24/19 Potential to Achieve Goals: Good ADL Goals Pt Will Perform Grooming: with modified independence;sitting;standing Pt Will Perform Lower Body Bathing: sit to/from stand;with adaptive equipment;with supervision Pt Will Perform Lower Body Dressing: with adaptive equipment;with supervision;sit to/from stand Pt Will Transfer to Toilet: with supervision;bedside commode;stand pivot transfer Pt Will Perform Toileting - Clothing Manipulation and hygiene: with supervision;sit to/from stand  Plan Discharge plan remains appropriate    Co-evaluation                 AM-PAC OT "6 Clicks" Daily Activity     Outcome Measure   Help from another person eating meals?: A Lot Help from another person taking care of personal grooming?: A  Lot Help from another person toileting, which includes using toliet, bedpan, or urinal?: Total Help from another person bathing (including washing, rinsing, drying)?: Total Help from another person to put on and taking off regular upper body clothing?: Total Help from another person to put on and taking off regular lower body clothing?: Total 6 Click Score: 8    End of Session    OT Visit Diagnosis: Other abnormalities of gait and mobility (R26.89);Muscle weakness (generalized) (M62.81);Pain;History of falling (Z91.81) Pain - Right/Left: Left Pain - part of body: Hip;Leg   Activity Tolerance Patient limited by lethargy   Patient Left in bed;with call bell/phone within reach;with bed alarm set;with nursing/sitter in room(with MD present)   Nurse Communication Mobility status(cognition)        Time: [id number] OT Time Calculation (min): 27 min  Charges: OT General Charges $OT Visit: 1 Visit OT Treatments $Self Care/Home Management : 23-37 mins  Dorinda Hill OTR/L Acute Rehabilitation Services Office: [place] 03/13/2019, 1:51 PM

## 2019-03-13 NOTE — Progress Notes (Signed)
Patient dialysis orders modified to resume Thursday March 14, 2019.  Patient received outpatient assignment for TTS.  Orders received from Dr. Augustin Coupe to change hemodialysis treatment from today to tomorrow to align with outpatient schedule.  Patient last inpatient dialysis was Tuesday September 22.

## 2019-03-13 NOTE — Progress Notes (Signed)
Patient has been accepted for OP HD treatment at [facility] on a TTS schedule with a seat time of 1:10pm. She needs to arrive 20 minutes early to her treatments. On her first day of treatment she needs to arrive at 12:30pm to sign consents. Renal Navigator notified Nephrologist/Dr. Augustin Coupe, but has not yet had an opportunity to discuss with patient. Dr. Augustin Coupe states she is not ready for discharge today.  Alphonzo Cruise, [place] Renal Navigator [phone]

## 2019-03-13 NOTE — Progress Notes (Signed)
Bladder scan patient got 890 ml text page Dr. Marianna Payment.

## 2019-03-13 NOTE — Progress Notes (Signed)
Text page Dr. Gilford Rile of bladder scan results 890 ml

## 2019-03-13 NOTE — Progress Notes (Signed)
  Date: 03/13/2019  Patient name: Phyllis Hernandez  Medical record number: [id number]  Date of birth: 01/02/1951   I have seen and evaluated this patient and I have discussed the plan of care with the house staff. Please see their note for complete details. I concur with their findings with the following additions/corrections:   New fever overnight, obtained blood cultures, CXR normal, CT head negative, lactate 1.3. UA today normal except for proteinuria. WBC up to 13.9, 87% PMNs, 1% bands. Started empiric vancomycin and zosyn. Mental status actually improved today. Discussed with husband at bedside, who is very upset and worried, he also noticed she is better today.   Considered LP with AMS and fever, but clearly mental status is waxing and waning consistent with delirium, most likely toxic/metabolic from uremia and possible infection. At this time, most likely source is leg wounds, although bacteremia possible as well.  Continue abx and follow blood cultures, will reconsider LP if mental status worsens progressively.  For now, she is agreeable to dialysis, was able to go yesterday. Unclear yet if she will actually continue with it long term.  [name], M.D., Ph.D. 03/13/2019, 3:56 PM

## 2019-03-13 NOTE — Progress Notes (Signed)
Paged to patient's room regarding new fever 101.27F.  Patient has had significant change in mental status since being admitted.  She originally was verbal and responding appropriately to commands.  When I evaluated the patient she is alert but lethargic and is not responding to commands.  There is no obvious source of infection based on exam.  Her heart sounds were within normal limits and her lungs were clear to auscultation bilaterally.  Her abdomen was soft the patient did not show any signs of pain with palpation.  The nurse states that her lower extremity wounds are changed daily and there was no signs of infections such as balance or drainage.  Patient appears to be weak on her right upper extremity.  She is able to move her left arm to help her reposition in bed and tends to slouch to the right side due to weakness. The patient was originally able to urinate but has not been able to void since being admitted.  She has also not had a bowel movement since being admitted.  The nurse tried to give Tylenol suppository and believes the patient is fecally impacted.  Patient is on serial dialysis with her most recent dialysis on 9/22.   ROS: Patient was unable to participate in review of systems due to altered mental status. Vitals:   03/13/19 0405 03/13/19 0406  BP:  (!) 116/52  Pulse:  86  Resp:  18  Temp: 100.3 F (37.9 C) (!) 101.9 F (38.8 C)  SpO2:  95%   Plan: -Order stat CXR of chest and CT of head without contrast. -Order lactic acid and CBC -Ordered 2X blood cultures and start patient on empiric broad-spectrum antibiotic with vancomycin and Zosyn.\  Marianna Payment, D.O. Date 03/13/2019 time 0549 hour PGY-1, Internal Medicine Residency

## 2019-03-13 NOTE — Progress Notes (Signed)
[facility] Progress Note    Assessment/ Plan:   1. ESRD - Started HD 12/2016 and went through 02/21/2018 (last HD).Did not return to dialysis and was thereforedischarged by [facility] for severe noncompliance. Ptdoes not currently have an OP HD unit. Not sure that patient will agree to long-term dialysis, she has agreed to trying in-hospital dialysis.Will consult palliative care for [place]. She c/o severe leg pains on HD which for now at least will try to control w/ IV/ po pain meds. 1st HD on Friday pt only stayed on machine 1 hour , poor UF 600 cc, signed off due to pain. 2nd HDSunday then refused HD Monday.  - Unclear if she wants to do long-term; will re-CLIP if she changes her mind. But we need a firm decision given her past history and being d/c from [place] for severe noncompliance.  - SPouse was bedside and has very poor understanding.  2. Hypertensive/ volume:Norvasc 10mg , IV prn and metoprolol 25mg  BID. Sig vol overload, aggressive UF.  3. Anemia - HGB 8.7.Give ESA with HD today. Order iron panel. 4. Metabolic bone disease - Check phos/PTH. Resume binders/VDRA when labs available. Ca 8.2.P 6.8 -> renvela 800mg  TIDM (start 9/22) 5. Nutrition - Renal/Carb mod diet.  6. Wounds bilateral LE-Wound consult. Lower volume.  Subjective:   More lethargic today. Very difficult to get her attention.   Objective:   BP 90/78 (BP Location: Right Wrist)   Pulse 60   Temp 98.4 F (36.9 C) (Axillary)   Resp (!) 23   Ht 5\' 4"  (1.626 m)   Wt 93.1 kg   SpO2 98%   BMI 35.23 kg/m   Intake/Output Summary (Last 24 hours) at 03/13/2019 1347 Last data filed at 03/13/2019 0448 Gross per 24 hour  Intake 120 ml  Output 0 ml  Net 120 ml   Weight change:   Physical Exam: General:Chronically ill appearing older femalein no acute distress, up in chair and sleeping Neck: Supple. JVD1/2 to mandible Lungs:Few crackles L base, otherwise CTAB Heart:HS distant, S1,S2.  RRR. Abdomen:Obese, active BS,probable ascites present M-S: Strength and tone appear normal for age. Lower extremities:Woody appearance BLE with open blisters on BLE. 2+ BLE edema. Neuro: Alert and oriented X 3. Moves all ext, gen weak and no asterixis or confusion Dialysis Access:[facility]  Imaging: Ct Head Wo Contrast  Result Date: 03/13/2019 CLINICAL DATA:  Headache. EXAM: CT HEAD WITHOUT CONTRAST TECHNIQUE: Contiguous axial images were obtained from the base of the skull through the vertex without intravenous contrast. COMPARISON:  CT scan of August 21, 2009. FINDINGS: Brain: Mild chronic ischemic white matter disease is noted. No mass effect or midline shift is noted. Ventricular size is within normal limits. There is no evidence of mass lesion, hemorrhage or acute infarction. Vascular: No hyperdense vessel or unexpected calcification. Skull: Normal. Negative for fracture or focal lesion. Sinuses/Orbits: No acute finding. Other: None. IMPRESSION: Mild chronic ischemic white matter disease. No acute intracranial abnormality seen. Electronically Signed   By: Marijo Conception M.D.   On: 03/13/2019 07:17   Dg Chest Port 1 View  Result Date: 03/13/2019 CLINICAL DATA:  Fever. EXAM: PORTABLE CHEST 1 VIEW COMPARISON:  03/08/2019. FINDINGS: Heart is enlarged. Right IJ dialysis catheter is in place. Mild pulmonary vascular congestion is similar the prior exam. No effusion. No significant airspace consolidation is present. Catheter is stable. IMPRESSION: 1. Stable cardiomegaly and chronic pulmonary vascular congestion. 2. Right IJ dialysis catheter is stable. Electronically Signed   By: Harrell Gave  Mattern M.D.   On: 03/13/2019 09:26    Labs: BMET Recent Labs  Lab 03/08/19 0538 03/09/19 0402 03/09/19 0711 03/10/19 1239 03/11/19 1702 03/12/19 0813  NA 137 136  --  136 135 136  K 5.5* 4.5  --  4.8 4.4 4.4  CL 109 106  --  106 101 102  CO2 9* 16*  --  13* 20* 20*  GLUCOSE 144* 129*  --   133* 135* 100*  BUN 93* 76*  --  77* 45* 47*  CREATININE 12.96* 10.33*  --  11.10* 8.05* 8.67*  CALCIUM 8.2* 7.8* 7.9* 7.9* 8.5* 8.0*  PHOS  --  6.9*  --  7.5* 6.8* 7.4*   CBC Recent Labs  Lab 03/10/19 1239 03/11/19 1702 03/12/19 0813 03/13/19 0730  WBC 7.6 10.7* 9.7 13.9*  NEUTROABS  --   --   --  12.0*  HGB 8.3* 9.1* 7.5* 8.7*  HCT 26.7* 28.9* 24.3* 27.7*  MCV 75.2* 75.1* 75.0* 75.3*  PLT 161 169 178 191    Medications:    . amLODipine  10 mg Oral Daily  . aspirin  81 mg Oral Daily  . Chlorhexidine Gluconate Cloth  6 each Topical Q0600  . Chlorhexidine Gluconate Cloth  6 each Topical Q0600  . darbepoetin (ARANESP) injection - NON-DIALYSIS  100 mcg Subcutaneous Q Sun-1800  . ezetimibe  10 mg Oral Daily  . ferrous sulfate  325 mg Oral Q breakfast  . heparin  5,000 Units Subcutaneous Q8H  . hydrocerin   Topical BID  . insulin aspart  0-5 Units Subcutaneous QHS  . insulin aspart  0-9 Units Subcutaneous TID WC  . metoprolol tartrate  25 mg Oral BID  . multivitamin  1 tablet Oral QHS  . sevelamer carbonate  800 mg Oral TID WC  . sodium chloride flush  3 mL Intravenous Q12H      [name], MD 03/13/2019, 1:47 PM

## 2019-03-13 NOTE — Progress Notes (Addendum)
Temp oral 100.3 . Patient felt hotter than that Rectal temp. 101.9 Tylenol Supp. Given text page Dr. Marianna Payment . Also could feel hard stool above the Tylenol supp. patient is impacted.

## 2019-03-13 NOTE — Progress Notes (Signed)
Pharmacy Antibiotic Note  Phyllis Hernandez is a 68 y.o. female admitted on 03/08/2019 for weakness and SOB.  On 9/23/230 noted new fever and AMS, also bilateral LE wounds.  Pharmacy has been consulted for vancomycin and zosyn dosing.  ESRD - HD with last on 9/22, however has declined previously.   Patient more lethargic 9/23, WBC 9.7>13.9, LA 1.4>1.3 Afeb, Tm 101.9  Vancomycin 2000mg  IV x1 given 0706 9/23 Zosyn 3.375g IV x1 given 0900 9/23  Neprhology ordered hemodailysis for today 03/13/19.     Plan: Vancomycin 1000mg  IV qMWF- HD Zosyn 3.375g IV q12h , 4 hr infusion  F/u HD plan for change in HD schedule inpatient.  Follow up doses, Cx and clinical progression to narrow Vancomycin level as needed  Height: 5\' 4"  (162.6 cm) Weight: 205 lb 4 oz (93.1 kg) IBW/kg (Calculated) : 54.7  Temp (24hrs), Avg:99.5 F (37.5 C), Min:98.1 F (36.7 C), Max:101.9 F (38.8 C)  Recent Labs  Lab 03/08/19 0538 03/09/19 0402 03/09/19 0405 03/10/19 1239 03/11/19 1702 03/12/19 0813 03/13/19 0730 03/13/19 1121  WBC 7.5  --  6.5 7.6 10.7* 9.7 13.9*  --   CREATININE 12.96* 10.33*  --  11.10* 8.05* 8.67*  --   --   LATICACIDVEN  --   --   --   --   --   --  1.4 1.3    Estimated Creatinine Clearance: 7.2 mL/min (A) (by C-G formula based on SCr of 8.67 mg/dL (H)).    Allergies  Allergen Reactions  . No Known Allergies     Bertis Ruddy, PharmD Clinical Pharmacist Please check AMION for all [place] numbers 03/13/2019 2:46 PM

## 2019-03-14 LAB — BASIC METABOLIC PANEL
Anion gap: 15 (ref 5–15)
BUN: 39 mg/dL — ABNORMAL HIGH (ref 8–23)
CO2: 22 mmol/L (ref 22–32)
Calcium: 7.8 mg/dL — ABNORMAL LOW (ref 8.9–10.3)
Chloride: 100 mmol/L (ref 98–111)
Creatinine, Ser: 7.83 mg/dL — ABNORMAL HIGH (ref 0.44–1.00)
GFR calc Af Amer: 6 mL/min — ABNORMAL LOW (ref >60)
GFR calc non Af Amer: 5 mL/min — ABNORMAL LOW (ref >60)
Glucose, Bld: 115 mg/dL — ABNORMAL HIGH (ref 70–99)
Potassium: 4.2 mmol/L (ref 3.5–5.1)
Sodium: 137 mmol/L (ref 135–145)

## 2019-03-14 LAB — GLUCOSE, CAPILLARY
Glucose-Capillary: 81 mg/dL (ref 70–99)
Glucose-Capillary: 75 mg/dL (ref 70–99)
Glucose-Capillary: 147 mg/dL — ABNORMAL HIGH (ref 70–99)
Glucose-Capillary: 109 mg/dL — ABNORMAL HIGH (ref 70–99)

## 2019-03-14 LAB — CBC
HCT: 25 % — ABNORMAL LOW (ref 36.0–46.0)
Hemoglobin: 7.7 g/dL — ABNORMAL LOW (ref 12.0–15.0)
MCH: 23.1 pg — ABNORMAL LOW (ref 26.0–34.0)
MCHC: 30.8 g/dL (ref 30.0–36.0)
MCV: 74.9 fL — ABNORMAL LOW (ref 80.0–100.0)
Platelets: 189 10*3/uL (ref 150–400)
RBC: 3.34 MIL/uL — ABNORMAL LOW (ref 3.87–5.11)
RDW: 16.4 % — ABNORMAL HIGH (ref 11.5–15.5)
WBC: 13.9 10*3/uL — ABNORMAL HIGH (ref 4.0–10.5)
nRBC: 0.4 % — ABNORMAL HIGH (ref 0.0–0.2)

## 2019-03-14 MED ORDER — HEPARIN SODIUM (PORCINE) 1000 UNIT/ML IJ SOLN
INTRAMUSCULAR | Status: AC
  Filled 2019-03-14: qty 4

## 2019-03-14 MED ORDER — VANCOMYCIN HCL IN DEXTROSE 1-5 GM/200ML-% IV SOLN
1000.0000 mg | INTRAVENOUS | Status: DC
  Administered 2019-03-14: 1000 mg via INTRAVENOUS
  Filled 2019-03-14 (×2): qty 200

## 2019-03-14 NOTE — Plan of Care (Signed)
Continue to monitor

## 2019-03-14 NOTE — Progress Notes (Signed)
Patient has no urine output for almost 13 hours,bladder scan done,312 ml obtained per scan,patient has no urge to void,no complaint of discomfort,the last documented urine output was yesterday 850 ml(in and out catheterization).Dr. Marianna Payment made aware without orders made.Patient is schedule for HD today.Will continue to monitor.

## 2019-03-14 NOTE — Progress Notes (Signed)
  Date: 03/14/2019  Patient name: Phyllis Hernandez  Medical record number: [id number]  Date of birth: [birth year]   I have seen and evaluated this patient and I have discussed the plan of care with the house staff. Please see their note for complete details. I concur with their findings with the following additions/corrections:   Remains somnolent but arousable, no complaints today at dialysis other than leg pain. No further fevers, WBC stable at 13.9, blood cultures NGTD, urine negative. Unclear source of fevers, possible leg wounds. If blood cultures still negative tomorrow, will consider stopping antibiotics vs transitioning to oral antibiotics for possible cellulitis.   Phyllis Hernandez, M.D., Ph.D. 03/14/2019, 4:58 PM

## 2019-03-14 NOTE — Progress Notes (Signed)
Patient very sleepy after dialysis. Patient will open eyes and respond to commands. Patient's husband concerned that she is not awake and active. I explained to patient's husband that dialysis can take a lot out of a patient and sometimes they just need rest after. Notified MD. Per Dr. Gilford Rile, he saw patient in dialysis and this is baseline for her over the last few days. Will continue to reinforce this to patient's husband.   Emelda Fear, RN

## 2019-03-14 NOTE — Progress Notes (Signed)
   Subjective:  Phyllis Hernandez was seen in the HD unit this morning. She states that she is doing well. Her only complaint is leg pain which she states happens during her dialysis sessions. She states that she is otherwise doing well. She states that she is producing urine still. All questions and concerns were addressed.   Objective: Vital signs in last 24 hours: Vitals:   03/14/19 0900 03/14/19 0930 03/14/19 1030 03/14/19 1108  BP: (!) 137/37 (!) 142/38 (!) 167/30 (!) 149/51  Pulse: 87 89 93 97  Resp:   19 18  Temp:   98.1 F (36.7 C) 98.7 F (37.1 C)  TempSrc:   Oral Oral  SpO2:   98% 99%  Weight:   94.5 kg   Height:      Physical Exam Vitals signs and nursing note reviewed.  Constitutional:      Appearance: Normal appearance. She is ill-appearing. She is not toxic-appearing or diaphoretic.     Comments: Elderly appearing female, able to answer questions with one sentence answers. Soft spoken.   HENT:     Head: Normocephalic and atraumatic.  Cardiovascular:     Rate and Rhythm: Normal rate and regular rhythm.     Heart sounds: No murmur. No friction rub. No gallop.   Pulmonary:     Effort: Pulmonary effort is normal.     Breath sounds: Normal breath sounds. No wheezing, rhonchi or rales.  Abdominal:     General: Bowel sounds are normal.     Palpations: Abdomen is soft.     Tenderness: There is no abdominal tenderness.    Assessment/Plan:  Principal Problem:   Volume overload Active Problems:   Type 2 diabetes mellitus with complication, without long-term current use of insulin (HCC)   ESRD (end stage renal disease) (HCC)   Anemia of chronic disease   Hypertensive urgency   Goals of care, counseling/discussion   Advance care planning   Palliative care encounter  Volume Overload 2/2 to ESRD: - 2/2 to missing/dismissal from HD sessions. 2018 Echo shows EF 60%, no evidence of valvular stenosis or regurgitation, no wall motion abnormalities, but mild left ventricular  hypertrophy. - Completed dialysis 03/14/2019.   Fever of Unknown Origin:   -  Fever has stayed in the 98.0-98.7 range - Lactic acid: 1.3 - CBC this morning showed leukocytosis with a WBC of 13.9. - Urinalysis showed no signs of infection.  - Blood cultures: no growth over 24 hours.   Hypertensive Urgency: - Systolic pressures range from (105-174)/(25-71) - Continue Amlodipine 10 mg QD - Continue Lopressor 25 mg BID  - Continue Hydralazine PRN Q6H   Bilateral LE wounds:  - Continue Eucerin. - continue changing wrappings as needed. - Continue Dilaudid 1-2 mg IV every dialysis session - Continue Percocet/Roxicet Q4H PRN   T2DM:  - Nonadherent to home diabetes medications - SSI  - Last CBG: 81   HLD:  - Continue Zetia 10 mg  Dispo: Anticipated discharge pending medical course.    Maudie Mercury, MD 03/14/2019, 12:49 PM Pager: [phone]

## 2019-03-14 NOTE — Progress Notes (Signed)
[facility] Progress Note    Assessment/ Plan:   1. ESRD - Started HD 12/2016 and went through 02/21/2018 (last HD).Did not return to dialysis and was thereforedischarged by [facility] for severe noncompliance. Ptdoes not currently have an OP HD unit. Not sure that patient will agree to long-term dialysis, she has agreed to trying in-hospital dialysis.Will consult palliative care for [name]. She c/o severe leg pains on HD which for now at least will try to control w/ IV/ po pain meds. 1st HD on Friday pt only stayed on machine 1 hour , poor UF 600 cc, signed off due to pain. 2nd HDSundaythenrefused HD Monday.  - Willre-CLIP; she was d/c from [facility] for severe noncompliance and I'm not convinced that will not happen again.  Seen on HD through [facility] More awake today and tolerating treatment. No changes.  2. Hypertensive/ volume:Norvasc10mg , IV prn and metoprolol25mg  BID. Sig vol overload, aggressive UF.  3. Anemia - HGB 8.7.Give ESA with HD today. Order iron panel. 4. Metabolic bone disease - Check phos/PTH. Resume binders/VDRA when labs available. Ca 8.2.P 6.8 -> renvela 800mg  TIDM (start 9/22) 5. Nutrition - Renal/Carb mod diet.  6. Wounds bilateral LE-Wound consult. Lower volume.  Subjective:   More awake today. Asking for food.   Objective:   BP (!) 171/25   Pulse 86   Temp 98.1 F (36.7 C) (Oral)   Resp 18   Ht 5\' 4"  (1.626 m)   Wt 96.2 kg   SpO2 95%   BMI 36.40 kg/m   Intake/Output Summary (Last 24 hours) at 03/14/2019 0950 Last data filed at 03/14/2019 0600 Gross per 24 hour  Intake 250 ml  Output 850 ml  Net -600 ml   Weight change: -2.2 kg  Physical Exam: General:Chronically ill appearing older femalein no acute distress, up in chair and sleeping Neck: Supple. JVD1/2 to mandible Lungs:Few crackles L base, otherwise CTAB Heart:HS distant, S1,S2. RRR. Abdomen:Obese, active BS,probable ascites present M-S: Strength and tone  appear normal for age. Lower extremities:Woody appearance BLE with open blisters on BLE. 2+ BLE edema. Neuro: Alert and oriented X 3. Moves all ext, gen weak and no asterixis or confusion Dialysis Access:[facility]  Imaging: Ct Head Wo Contrast  Result Date: 03/13/2019 CLINICAL DATA:  Headache. EXAM: CT HEAD WITHOUT CONTRAST TECHNIQUE: Contiguous axial images were obtained from the base of the skull through the vertex without intravenous contrast. COMPARISON:  CT scan of August 21, 2009. FINDINGS: Brain: Mild chronic ischemic white matter disease is noted. No mass effect or midline shift is noted. Ventricular size is within normal limits. There is no evidence of mass lesion, hemorrhage or acute infarction. Vascular: No hyperdense vessel or unexpected calcification. Skull: Normal. Negative for fracture or focal lesion. Sinuses/Orbits: No acute finding. Other: None. IMPRESSION: Mild chronic ischemic white matter disease. No acute intracranial abnormality seen. Electronically Signed   By: Marijo Conception M.D.   On: 03/13/2019 07:17   Dg Chest Port 1 View  Result Date: 03/13/2019 CLINICAL DATA:  Fever. EXAM: PORTABLE CHEST 1 VIEW COMPARISON:  03/08/2019. FINDINGS: Heart is enlarged. Right IJ dialysis catheter is in place. Mild pulmonary vascular congestion is similar the prior exam. No effusion. No significant airspace consolidation is present. Catheter is stable. IMPRESSION: 1. Stable cardiomegaly and chronic pulmonary vascular congestion. 2. Right IJ dialysis catheter is stable. Electronically Signed   By: San Morelle M.D.   On: 03/13/2019 09:26    Labs: BMET Recent Labs  Lab 03/08/19 [phone]  03/09/19 0402 03/09/19 0711 03/10/19 1239 03/11/19 1702 03/12/19 0813 03/14/19 0737  NA 137 136  --  136 135 136 137  K 5.5* 4.5  --  4.8 4.4 4.4 4.2  CL 109 106  --  106 101 102 100  CO2 9* 16*  --  13* 20* 20* 22  GLUCOSE 144* 129*  --  133* 135* 100* 115*  BUN 93* 76*  --  77* 45* 47* 39*   CREATININE 12.96* 10.33*  --  11.10* 8.05* 8.67* 7.83*  CALCIUM 8.2* 7.8* 7.9* 7.9* 8.5* 8.0* 7.8*  PHOS  --  6.9*  --  7.5* 6.8* 7.4*  --    CBC Recent Labs  Lab 03/11/19 1702 03/12/19 0813 03/13/19 0730 03/14/19 0737  WBC 10.7* 9.7 13.9* 13.9*  NEUTROABS  --   --  12.0*  --   HGB 9.1* 7.5* 8.7* 7.7*  HCT 28.9* 24.3* 27.7* 25.0*  MCV 75.1* 75.0* 75.3* 74.9*  PLT 169 178 191 189    Medications:    . amLODipine  10 mg Oral Daily  . aspirin  81 mg Oral Daily  . Chlorhexidine Gluconate Cloth  6 each Topical Q0600  . Chlorhexidine Gluconate Cloth  6 each Topical Q0600  . darbepoetin (ARANESP) injection - NON-DIALYSIS  100 mcg Subcutaneous Q Sun-1800  . ezetimibe  10 mg Oral Daily  . ferrous sulfate  325 mg Oral Q breakfast  . heparin  5,000 Units Subcutaneous Q8H  . hydrocerin   Topical BID  . insulin aspart  0-5 Units Subcutaneous QHS  . insulin aspart  0-9 Units Subcutaneous TID WC  . metoprolol tartrate  25 mg Oral BID  . multivitamin  1 tablet Oral QHS  . sevelamer carbonate  800 mg Oral TID WC  . sodium chloride flush  3 mL Intravenous Q12H      [name], MD 03/14/2019, 9:50 AM

## 2019-03-14 NOTE — Progress Notes (Signed)
Patient back to room from dialysis at this time. V/s done and assessment complete. Family at bedside.

## 2019-03-14 NOTE — Care Management Important Message (Signed)
Important Message  Patient Details  Name: Phyllis Hernandez MRN: [id number] Date of Birth: February 13, 1951   Medicare Important Message Given:  Yes     Shelda Altes 03/14/2019, 3:18 PM

## 2019-03-15 LAB — RENAL FUNCTION PANEL
Albumin: 1.7 g/dL — ABNORMAL LOW (ref 3.5–5.0)
Anion gap: 12 (ref 5–15)
BUN: 22 mg/dL (ref 8–23)
CO2: 25 mmol/L (ref 22–32)
Calcium: 7.5 mg/dL — ABNORMAL LOW (ref 8.9–10.3)
Chloride: 98 mmol/L (ref 98–111)
Creatinine, Ser: 5.1 mg/dL — ABNORMAL HIGH (ref 0.44–1.00)
GFR calc Af Amer: 10 mL/min — ABNORMAL LOW (ref >60)
GFR calc non Af Amer: 8 mL/min — ABNORMAL LOW (ref >60)
Glucose, Bld: 128 mg/dL — ABNORMAL HIGH (ref 70–99)
Phosphorus: 5 mg/dL — ABNORMAL HIGH (ref 2.5–4.6)
Potassium: 4.1 mmol/L (ref 3.5–5.1)
Sodium: 135 mmol/L (ref 135–145)

## 2019-03-15 LAB — GLUCOSE, CAPILLARY
Glucose-Capillary: 107 mg/dL — ABNORMAL HIGH (ref 70–99)
Glucose-Capillary: 112 mg/dL — ABNORMAL HIGH (ref 70–99)
Glucose-Capillary: 124 mg/dL — ABNORMAL HIGH (ref 70–99)
Glucose-Capillary: 92 mg/dL (ref 70–99)

## 2019-03-15 LAB — CBC
HCT: 24.6 % — ABNORMAL LOW (ref 36.0–46.0)
Hemoglobin: 7.5 g/dL — ABNORMAL LOW (ref 12.0–15.0)
MCH: 22.9 pg — ABNORMAL LOW (ref 26.0–34.0)
MCHC: 30.5 g/dL (ref 30.0–36.0)
MCV: 75 fL — ABNORMAL LOW (ref 80.0–100.0)
Platelets: 201 10*3/uL (ref 150–400)
RBC: 3.28 MIL/uL — ABNORMAL LOW (ref 3.87–5.11)
RDW: 16 % — ABNORMAL HIGH (ref 11.5–15.5)
WBC: 12.2 10*3/uL — ABNORMAL HIGH (ref 4.0–10.5)
nRBC: 0.5 % — ABNORMAL HIGH (ref 0.0–0.2)

## 2019-03-15 MED ORDER — NEPRO/CARBSTEADY PO LIQD
237.0000 mL | Freq: Two times a day (BID) | ORAL | Status: DC
  Administered 2019-03-15 – 2019-03-18 (×2): 237 mL via ORAL

## 2019-03-15 MED ORDER — SENNA 8.6 MG PO TABS
1.0000 | ORAL_TABLET | Freq: Two times a day (BID) | ORAL | Status: DC
  Administered 2019-03-15 – 2019-03-24 (×13): 8.6 mg via ORAL
  Filled 2019-03-15 (×15): qty 1

## 2019-03-15 MED ORDER — POLYETHYLENE GLYCOL 3350 17 G PO PACK
17.0000 g | PACK | Freq: Two times a day (BID) | ORAL | Status: DC
  Administered 2019-03-16 – 2019-04-01 (×13): 17 g via ORAL
  Filled 2019-03-15 (×18): qty 1

## 2019-03-15 MED ORDER — ORAL CARE MOUTH RINSE
15.0000 mL | Freq: Two times a day (BID) | OROMUCOSAL | Status: DC
  Administered 2019-03-15 – 2019-04-02 (×30): 15 mL via OROMUCOSAL

## 2019-03-15 MED ORDER — DARBEPOETIN ALFA 100 MCG/0.5ML IJ SOSY
100.0000 ug | PREFILLED_SYRINGE | INTRAMUSCULAR | Status: DC
  Filled 2019-03-15: qty 0.5

## 2019-03-15 NOTE — Progress Notes (Signed)
Occupational Therapy Treatment Patient Details Name: Phyllis Hernandez MRN: [id number] DOB: 1950/09/18 Today's Date: 03/15/2019    History of present illness Pt is a 68 y.o. female admitted 03/08/19 with volume overload and hypertensive overload after missing HD sessions (per chart, has missed HD for approximately one year); pt opting to resume HD while admitted, but unable to tolerate complete session due to BLE pain. PMH includes ESRD (on HD), DM2, HTN, depression.   OT comments  Pt seen with PT for functional mobility progression and ADL participation. Pt TOTAL A +2 for transition to EOB. Pt required MAX A +2 to sit EOB for ~ 4 minutes. Pt agitated throughout session stating "you're doing too much" and perseverating on where her husband was. Total A +2 back to supine. Assisted pt with self- feeding as precursor to higher level ADLs. Pt required MAX multimodal cues to initiate eating and MIN-MOD A to open packets. MIN A for assistance with sipping hot coffee. DC plan remains appropriate. Will continue to follow for acute OT needs.   Follow Up Recommendations  Supervision/Assistance - 24 hour;SNF    Equipment Recommendations  3 in 1 bedside commode    Recommendations for Other Services      Precautions / Restrictions Precautions Precautions: Fall;Other (comment) Precaution Comments: Bilateral feet/lower leg wounds wrapped in ace wrap, watch BP Restrictions Weight Bearing Restrictions: No       Mobility Bed Mobility Overal bed mobility: Needs Assistance Bed Mobility: Supine to Sit;Sit to Supine     Supine to sit: Total assist;+2 for physical assistance;HOB elevated Sit to supine: Max assist;+2 for physical assistance   General bed mobility comments: No initiation. Agitated. Resisting mobility at times. Helicopter method utilized to pivot supine to sitting EOB.  Transfers                 General transfer comment: unable to attempt d/t agitation and resistance     Balance Overall balance assessment: Needs assistance Sitting-balance support: Bilateral upper extremity supported;Feet supported Sitting balance-Leahy Scale: Poor Sitting balance - Comments: max assist to maintain sitting balance EOB x 4 minutes. Pt with posterior/right lean. Postural control: Right lateral lean;Posterior lean                                 ADL either performed or assessed with clinical judgement   ADL Overall ADL's : Needs assistance/impaired Eating/Feeding: Moderate assistance;Sitting;Minimal assistance Eating/Feeding Details (indicate cue type and reason): pt required MAX multimodal cues to initate eating breakfast upright in bed; required MIN A with opening packets and safety with hot coffee                       Toilet Transfer Details (indicate cue type and reason): unable to transfer OOB from EOB d/t pt resistance statind "you're doing too much"         Functional mobility during ADLs: Total assistance;+2 for physical assistance General ADL Comments: unable to participate in further ADLs d/t agitation and self- limiting behavior; pt able to sit EOB ~ 4 min with MAX A +2     Vision       Perception     Praxis      Cognition Arousal/Alertness: Awake/alert Behavior During Therapy: Agitated Overall Cognitive Status: Impaired/Different from baseline Area of Impairment: Orientation;Attention;Safety/judgement;Following commands;Problem solving;Awareness                 Orientation  Level: Disoriented to;Place;Time;Situation Current Attention Level: Sustained   Following Commands: Follows one step commands inconsistently Safety/Judgement: Decreased awareness of safety;Decreased awareness of deficits Awareness: Emergent Problem Solving: Slow processing;Decreased initiation;Difficulty sequencing;Requires verbal cues;Requires tactile cues General Comments: Agitated. Stating "too many visitors. I want to see my husband."  Resisting assist stating "you're doing too much"        Exercises     Shoulder Instructions       General Comments Husband arrived at end of session    Pertinent Vitals/ Pain       Pain Assessment: Faces Faces Pain Scale: Hurts even more Pain Location: generalized. Pt grimacing but unable to state source of pain. Pain Descriptors / Indicators: Grimacing;Moaning;Guarding Pain Intervention(s): Limited activity within patient's tolerance;Monitored during session;Repositioned  Home Living                                          Prior Functioning/Environment              Frequency  Min 2X/week        Progress Toward Goals  OT Goals(current goals can now be found in the care plan section)  Progress towards OT goals: Not progressing toward goals - comment(pt with self limiting behaviors this session; overall limited by pain and decreased activity tolerance limiting pt ability to reach OT goals)  Acute Rehab OT Goals Patient Stated Goal: not stated OT Goal Formulation: With patient Time For Goal Achievement: 03/24/19 Potential to Achieve Goals: Good  Plan Discharge plan remains appropriate    Co-evaluation    PT/OT/SLP Co-Evaluation/Treatment: Yes Reason for Co-Treatment: For patient/therapist safety;Complexity of the patient's impairments (multi-system involvement) PT goals addressed during session: Mobility/safety with mobility;Balance OT goals addressed during session: ADL's and self-care      AM-PAC OT "6 Clicks" Daily Activity     Outcome Measure   Help from another person eating meals?: A Lot Help from another person taking care of personal grooming?: A Lot Help from another person toileting, which includes using toliet, bedpan, or urinal?: Total Help from another person bathing (including washing, rinsing, drying)?: Total Help from another person to put on and taking off regular upper body clothing?: Total Help from another person to  put on and taking off regular lower body clothing?: Total 6 Click Score: 8    End of Session    OT Visit Diagnosis: Other abnormalities of gait and mobility (R26.89);Muscle weakness (generalized) (M62.81);Pain;History of falling (Z91.81)   Activity Tolerance Patient limited by pain;Treatment limited secondary to agitation   Patient Left in bed;with call bell/phone within reach;with bed alarm set;with family/visitor present   Nurse Communication          Time: [id number] OT Time Calculation (min): 27 min  Charges: OT General Charges $OT Visit: 1 Visit OT Treatments $Self Care/Home Management : 8-22 mins  [place], [place] [phone] [place] 03/15/2019, 11:42 AM

## 2019-03-15 NOTE — Progress Notes (Signed)
Physical Therapy Treatment Patient Details Name: Phyllis Hernandez MRN: [id number] DOB: [birth year] Today's Date: 03/15/2019    History of Present Illness Pt is a 68 y.o. female admitted 03/08/19 with volume overload and hypertensive overload after missing HD sessions (per chart, has missed HD for approximately one year); pt opting to resume HD while admitted, but unable to tolerate complete session due to BLE pain. PMH includes ESRD (on HD), DM2, HTN, depression.    PT Comments    Pt received in bed. Agitated, stating "too many visitors." She required +2 total assist supine to sit. Pt sat EOB x 4 minutes with max assist to maintain balance. Pt pushing posterior and right attempting to return back to bed. Assisted pt sit to supine +2 total assist.  Session limited by agitation.  Pt set up for breakfast at end of session with husband present.   Follow Up Recommendations  SNF;Supervision/Assistance - 24 hour     Equipment Recommendations  Other (comment)(TBD)    Recommendations for Other Services       Precautions / Restrictions Precautions Precautions: Fall;Other (comment) Precaution Comments: Bilateral feet/lower leg wounds wrapped in ace wrap, watch BP    Mobility  Bed Mobility Overal bed mobility: Needs Assistance Bed Mobility: Supine to Sit;Sit to Supine     Supine to sit: Total assist;+2 for physical assistance;HOB elevated     General bed mobility comments: No initiation. Agitated. Resisting mobility at times. Helicopter method utilized to pivot supine to sitting EOB.  Transfers                    Ambulation/Gait                 Stairs             Wheelchair Mobility    Modified Rankin (Stroke Patients Only)       Balance Overall balance assessment: Needs assistance Sitting-balance support: Bilateral upper extremity supported;Feet supported Sitting balance-Leahy Scale: Poor Sitting balance - Comments: max assist to maintain sitting  balance EOB x 4 minutes. Pt with posterior/right lean. Postural control: Right lateral lean;Posterior lean                                  Cognition Arousal/Alertness: Awake/alert Behavior During Therapy: Agitated Overall Cognitive Status: Impaired/Different from baseline Area of Impairment: Orientation;Attention;Safety/judgement;Following commands;Problem solving;Awareness                 Orientation Level: Disoriented to;Place;Time;Situation Current Attention Level: Sustained   Following Commands: Follows one step commands inconsistently Safety/Judgement: Decreased awareness of safety;Decreased awareness of deficits Awareness: Emergent Problem Solving: Slow processing;Decreased initiation;Difficulty sequencing;Requires verbal cues;Requires tactile cues General Comments: Agitated. Stating "too many visitors. I want to see my husband."      Exercises      General Comments General comments (skin integrity, edema, etc.): Husband arrived at end of session.      Pertinent Vitals/Pain Pain Assessment: Faces Faces Pain Scale: Hurts even more Pain Location: generalized. Pt grimacing but unable to state source of pain. Pain Descriptors / Indicators: Grimacing;Moaning;Guarding Pain Intervention(s): Limited activity within patient's tolerance;Monitored during session;Repositioned    Home Living                      Prior Function            PT Goals (current goals can now be found in the care  plan section) Acute Rehab PT Goals Patient Stated Goal: not stated PT Goal Formulation: With patient Time For Goal Achievement: 03/24/19 Potential to Achieve Goals: Good Progress towards PT goals: Not progressing toward goals - comment(unwilling to participate/agitation)    Frequency    Min 2X/week      PT Plan Current plan remains appropriate    Co-evaluation PT/OT/SLP Co-Evaluation/Treatment: Yes Reason for Co-Treatment: For patient/therapist  safety;Complexity of the patient's impairments (multi-system involvement) PT goals addressed during session: Mobility/safety with mobility;Balance        AM-PAC PT "6 Clicks" Mobility   Outcome Measure  Help needed turning from your back to your side while in a flat bed without using bedrails?: Total Help needed moving from lying on your back to sitting on the side of a flat bed without using bedrails?: Total Help needed moving to and from a bed to a chair (including a wheelchair)?: Total Help needed standing up from a chair using your arms (e.g., wheelchair or bedside chair)?: Total Help needed to walk in hospital room?: Total Help needed climbing 3-5 steps with a railing? : Total 6 Click Score: 6    End of Session   Activity Tolerance: Treatment limited secondary to agitation Patient left: in bed;with call bell/phone within reach Nurse Communication: Mobility status PT Visit Diagnosis: Other abnormalities of gait and mobility (R26.89);Muscle weakness (generalized) (M62.81);History of falling (Z91.81)     Time: [id number] PT Time Calculation (min) (ACUTE ONLY): 27 min  Charges:  $Therapeutic Activity: 8-22 mins                     Lorrin Goodell, PT  Office # [phone] Pager [phone]    Lorriane Shire 03/15/2019, 10:15 AM

## 2019-03-15 NOTE — Progress Notes (Signed)
Pt's last charted urine output 810mL via in and out on 9/23. Bladder scanned pt, 496 mLs retained. Pt denied discomfort or urge to urinate. Pt straight cathed for 2nd time per protocol, 556mLs clear yellow urine returned. Peri care done. Pt tolerated well.

## 2019-03-15 NOTE — Progress Notes (Signed)
  Date: 03/15/2019  Patient name: Phyllis Hernandez  Medical record number: [id number]  Date of birth: 1951/02/06   I have seen and evaluated this patient and I have discussed the plan of care with the house staff. Please see their note for complete details. I concur with their findings with the following additions/corrections:   Unfortunately, still quite somnolent and inattentive, drifts off during conversations and sometimes stares into space after being asked a question. Unclear etiology, no focal deficits on exam, negative CT head on admission, will check MRI brain. Retaining urine, will continue to check bladder scans and I/O cath prn, also will work on bowel regimen. Would have expected to clear by now if related to uremia as she has now be dialyzed successfully a couple times.   Will need to demonstrate some improvement before discharge and have established outpatient dialysis plan.   [name], M.D., Ph.D. 03/15/2019, 6:05 PM

## 2019-03-15 NOTE — Progress Notes (Signed)
   Subjective: Patient reports that she feels fine. Oriented to person and place but not time. Patient continues to be minimally responsive to questions. Would like team to talk to husband regarding MRI.   Objective:  Vital signs in last 24 hours: Vitals:   03/14/19 1800 03/14/19 1959 03/14/19 2359 03/15/19 0444  BP: (!) 97/54 107/67 111/88 128/64  Pulse: 70 74 71 73  Resp: 15 11 13 16   Temp:  98 F (36.7 C) 99.5 F (37.5 C) (!) 97.5 F (36.4 C)  TempSrc:  Oral Oral Oral  SpO2: 97% 97% 95% 97%  Weight:    95.2 kg  Height:        General: Tired appearing female, NAD, laying in bed Cardiac: RRR, no m/r/g Pulmonary: CTABL, normal work of breathing Abdomen: Soft, non-tender, non-distended, normoactive bowel sounds Extremity:BL LE wrapped in bandages Neuro: Awake, oriented to person and time    Assessment/Plan:  Principal Problem:   Volume overload Active Problems:   Type 2 diabetes mellitus with complication, without long-term current use of insulin (HCC)   ESRD (end stage renal disease) (HCC)   Anemia of chronic disease   Hypertensive urgency   Goals of care, counseling/discussion   Advance care planning   Palliative care encounter  AMS, VolumeOverload2/2 to ESRD: Likely due to missing/dismissal from HD sessions. 2018 Echo shows EF 60%, no evidence of valvular stenosis or regurgitation, no wall motion abnormalities, but mild left ventricular hypertrophy. Patient continues to appear very fatigued and tired, no focal deficits noted.   - Completed dialysis 03/14/2019.  - Will obtain MRI if husband agreeable  Fever of Unknown Origin:   - Patient has been afebrile overnight, LA 1.3. WBC improved to 12 from 13.  Urinalysis showed no signs of infection.  Blood cultures showed NGTD. Unclear source of infection at this time.   -Currently on zosyn and vanc, will continue for now   HypertensiveUrgency: - Bps range 107-126/42-88  - Continue Amlodipine 10 mg QD -  Continue Lopressor 25 mg BID - Continue Hydralazine PRN Q6H  Bilateral LE wounds:  -Continue Eucerin. - continue changing wrappings as needed. - Continue Dilaudid 1-2 mg IV every dialysis session - Continue Percocet/Roxicet Q4H PRN  T2DM:  - Nonadherent to home diabetes medications. CBG ranged around 107-128.   - SSI  FEN: No fluids, replete lytes prn, renal diet  VTE ppx: Heparin Code Status: FULL    Dispo: Anticipated discharge is pending clinical improvement.   Asencion Noble, MD 03/15/2019, 6:24 AM Pager: [phone]

## 2019-03-15 NOTE — Progress Notes (Signed)
Renal Navigator attempted to meet with patient at bedside to discuss commitment to OP HD treatment and provide her with schedule at [facility]. Patient had her eyes open, but when Renal Navigator asked to enter, she did not respond. Navigator asked multiple times in various different ways, explaining role and reason for visit, but patient just looked, making no comment. Renal Navigator left the room. Renal Navigator updated Nephrologist and CSW.  Renal Navigator attempted to call husband/John and son/Michael, but both numbers stated that there were voicemail boxes that had not been set up. Renal Navigator unsure of disposition plan at this time. Renal Navigator will follow up at a later time.  Alphonzo Cruise, [place] Renal Navigator [phone]

## 2019-03-15 NOTE — Progress Notes (Signed)
[facility] Progress Note    Assessment/ Plan:   1. ESRD - Started HD 12/2016 and went through 02/21/2018 (last HD).Did not return to dialysis and was thereforedischarged by [facility] for severe noncompliance. Ptdoes not currently have an OP HD unit. Not sure that patient will agree to long-term dialysis, she has agreed to trying in-hospital dialysis.Will consult palliative care for [place]. She c/o severe leg pains on HD which for now at least will try to control w/ IV/ po pain meds. 1st HD on Friday pt only stayed on machine 1 hour , poor UF 600 cc, signed off due to pain. 2nd HDSundaythenrefused HD Monday.  - Was going to re-CLIP but she just does not look well and certainly  Not a candidate for long term outpt dialysis. I believe there needs to be a family meeting with discussion of expectations and alternatives. NOT a outpt candidate at this time and would have palliative see her again.  Next HD Sat  2. Hypertensive/ volume:Norvasc10mg , IV prn and metoprolol25mg  BID. Sig vol overload, aggressive UF.  3. Anemia - HGB 8.7.Give ESA with HD today. Order iron panel. 4. Metabolic bone disease - Check phos/PTH. Resume binders/VDRA when labs available. Ca 8.2.P 6.8 -> renvela 800mg  TIDM (start 9/22) 5. Nutrition - Renal/Carb mod diet.  6. Wounds bilateral LE-Wound consult. Lower volume.  Subjective:   Confused with a blank stare. No change. Fortunately cooperative.   Objective:   BP (!) 117/53   Pulse 77   Temp 98.6 F (37 C)   Resp 14   Ht 5\' 4"  (1.626 m)   Wt 95.2 kg   SpO2 100%   BMI 36.03 kg/m   Intake/Output Summary (Last 24 hours) at 03/15/2019 1431 Last data filed at 03/15/2019 [id number] Gross per 24 hour  Intake 400 ml  Output 500 ml  Net -100 ml   Weight change: 0.2 kg  Physical Exam: General:Chronically ill appearing older femalein no acute distress, confused w/ a blank stare Neck: Supple. JVD1/2 to mandible Lungs:Few crackles L base,  otherwise CTAB Heart:HS distant, S1,S2. RRR. Abdomen:Obese, active BS,probable ascites present M-S: Strength and tone appear normal for age. Lower extremities:Woody appearance BLE with open blisters on BLE. 2+ BLE edema. Neuro: Alert and oriented X 3. Moves all ext, gen weak and no asterixis or confusion Dialysis Access:RIJ TDC   Imaging: No results found.  Labs: BMET Recent Labs  Lab 03/09/19 0402 03/09/19 0711 03/10/19 1239 03/11/19 1702 03/12/19 0813 03/14/19 0737 03/15/19 0426  NA 136  --  136 135 136 137 135  K 4.5  --  4.8 4.4 4.4 4.2 4.1  CL 106  --  106 101 102 100 98  CO2 16*  --  13* 20* 20* 22 25  GLUCOSE 129*  --  133* 135* 100* 115* 128*  BUN 76*  --  77* 45* 47* 39* 22  CREATININE 10.33*  --  11.10* 8.05* 8.67* 7.83* 5.10*  CALCIUM 7.8* 7.9* 7.9* 8.5* 8.0* 7.8* 7.5*  PHOS 6.9*  --  7.5* 6.8* 7.4*  --  5.0*   CBC Recent Labs  Lab 03/12/19 0813 03/13/19 0730 03/14/19 0737 03/15/19 0426  WBC 9.7 13.9* 13.9* 12.2*  NEUTROABS  --  12.0*  --   --   HGB 7.5* 8.7* 7.7* 7.5*  HCT 24.3* 27.7* 25.0* 24.6*  MCV 75.0* 75.3* 74.9* 75.0*  PLT 178 191 189 201    Medications:    . amLODipine  10 mg Oral Daily  .  aspirin  81 mg Oral Daily  . Chlorhexidine Gluconate Cloth  6 each Topical Q0600  . Chlorhexidine Gluconate Cloth  6 each Topical Q0600  . darbepoetin (ARANESP) injection - NON-DIALYSIS  100 mcg Subcutaneous Q Sun-1800  . ezetimibe  10 mg Oral Daily  . feeding supplement (NEPRO CARB STEADY)  237 mL Oral BID BM  . ferrous sulfate  325 mg Oral Q breakfast  . heparin  5,000 Units Subcutaneous Q8H  . hydrocerin   Topical BID  . insulin aspart  0-5 Units Subcutaneous QHS  . insulin aspart  0-9 Units Subcutaneous TID WC  . metoprolol tartrate  25 mg Oral BID  . multivitamin  1 tablet Oral QHS  . sevelamer carbonate  800 mg Oral TID WC  . sodium chloride flush  3 mL Intravenous Q12H      [name], MD 03/15/2019, 2:31 PM

## 2019-03-15 NOTE — Progress Notes (Addendum)
Initial Nutrition Assessment   RD working remotely.   DOCUMENTATION CODES:   Obesity unspecified  INTERVENTION:   Nepro Shake po BID, each supplement provides 425 kcal and 19 grams protein  Magic cup TID with meals, each supplement provides 290 kcal and 9 grams of protein  Continue Rena-Vit  Constipation, last BM on 9/18. Recommend scheduled bowel regimen  Recommend VDRA as iPTH elevated with phosphorus and calcium currently at acceptable levels  Remove Carb Modified to current diet order; if po intake remains poor, recommend liberalizing diet further  NUTRITION DIAGNOSIS:   Inadequate oral intake related to lethargy/confusion, poor appetite as evidenced by meal completion < 50%, per patient/family report.  GOAL:   Patient will meet greater than or equal to 90% of their needs  MONITOR:   PO intake, Labs, Weight trends, Supplement acceptance, Skin  REASON FOR ASSESSMENT:   Consult Assessment of nutrition requirement/status, Poor PO  ASSESSMENT:   68 yo female admitted with HTN urgency, volume overload secondary to missing HD. Pt reports she had not been to HD for "months", Last documented HD 02/21/18. Initiated HD 12/2016 but was noncompliant with HD and was "discharged" from dialysis clinic. PMH includes ESRD/HD, DM, HLD, HTN  Access: IJ TC CLIP process restarted; pt c/o severe leg pain on HD. 1st HD on Friday 9/19 pt only completed 1 hour before signing off. Received 2nd HD on Sunday 9/20 but then refused HD on Monday 9/21. Pt received HD on Wednesday 9/23  Palliative Care has been consulted for [place] including pt wishes to participate in long-term HD  Recorded po intake 0-90%; limited documentation of po intake. Recorded po intake <50% on average. Per chart review, it appears pt has missed some meals due to somnolence  Pt is somnolent but arouseable; Spoke with RN who reports husband fed pt this AM and pt ate about 25-50% of meal tray. Husband no longer at pt bedside.    iPTH 667 (H) on 9/19; phosphorus range since admission 5.0-7.5 (goal 5.5 or less); renvela has been started, phosphorus acceptable today at 5.0. Corrected calcium 9.4 Recommend resuming VDRA  B/L LE wounds, evaluated by WOC RN  Pt UOP appears to be >500 mL daily  Pt with hx of DM, currently on sliding scale but has not required recent coverage. Noted pt even with hypoglycemic episodes, long acting insulin discontinued. Recommend removal of Carb Modified to current diet order to promote po intake  Constipation, last BM on 9/18. Recommend scheduled bowel regimen, noted pt on ferrous sulfate as well as pain regimen, both of which can be constipating  Unsure of UBW or dry weight. Current wt 95.2 kg. Admit weight 101.3 kg. Noted weight post HD on 9/24 94.5 kg; 94.7 post HD on 9/22. Utilizing 94.5 as EDW at present  Labs: phosphorus 5.0 (acceptable for HD), CBGs 75-147, corrected calcium 9.4, albumin 1.7 Meds: aranesp, ferrous sulfate, Rena-Vit, ss novolog with meals and bedtime, revenala with meals  NUTRITION - FOCUSED PHYSICAL EXAM:  Working remotely, unable to assess  Diet Order:  Renal/Carb Modified with 1500 mL fluid restriction  EDUCATION NEEDS:   Not appropriate for education at this time  Skin:  Skin Assessment: Skin Integrity Issues: Skin Integrity Issues:: Other (Comment) Other: biletaeral LE ulcerations (seen by [facility] RN) from xerosis, partial thickness skin loss, dry wound bed  Last BM:  9/18  Height:   Ht Readings from Last 1 Encounters:  03/08/19 5\' 4"  (1.626 m)    Weight:   Wt Readings from  Last 1 Encounters:  03/15/19 95.2 kg    Ideal Body Weight:  54.5 kg  BMI:  Body mass index is 36.03 kg/m.  Estimated Nutritional Needs:   Kcal:  2000-2200 kcals  Protein:  100-115 g  Fluid:  1000 mL plus UOP (Pt's UOP has been >500 mL daily)   Kerman Passey MS, RDN, LDN, CNSC [phone] Pager  [phone] Weekend/On-Call Pager

## 2019-03-15 NOTE — Progress Notes (Signed)
Responded to spiritual care consult for an Advanced Directive.  I asked Phyllis Hernandez questions and she did not respond, which prompted me to speak with her nurse.  The nurse indicated that Phyllis Hernandez is not coherent enough at this time to be able to complete the paperwork.    Please page chaplain as needed.

## 2019-03-15 NOTE — Progress Notes (Addendum)
Pt alert and open eyes to voice, but does not converse. Very delayed when swallowing pills, when asked to sip juice w/ miralax mixed in, pt immediately coughs. Pills then put in applesauce, and pt does not initially follow commands. Eventually able to swallow pills fine w/ very frequent redirecting. Mouth care was performed afterwards. Awaiting MRI.

## 2019-03-16 ENCOUNTER — Inpatient Hospital Stay (HOSPITAL_COMMUNITY): Payer: Medicare Other

## 2019-03-16 DIAGNOSIS — R4182 Altered mental status, unspecified: Secondary | ICD-10-CM

## 2019-03-16 DIAGNOSIS — I63543 Cerebral infarction due to unspecified occlusion or stenosis of bilateral cerebellar arteries: Secondary | ICD-10-CM

## 2019-03-16 DIAGNOSIS — E8779 Other fluid overload: Secondary | ICD-10-CM

## 2019-03-16 DIAGNOSIS — I639 Cerebral infarction, unspecified: Secondary | ICD-10-CM

## 2019-03-16 LAB — CBC
HCT: 24.5 % — ABNORMAL LOW (ref 36.0–46.0)
Hemoglobin: 7.4 g/dL — ABNORMAL LOW (ref 12.0–15.0)
MCH: 22.7 pg — ABNORMAL LOW (ref 26.0–34.0)
MCHC: 30.2 g/dL (ref 30.0–36.0)
MCV: 75.2 fL — ABNORMAL LOW (ref 80.0–100.0)
Platelets: 193 10*3/uL (ref 150–400)
RBC: 3.26 MIL/uL — ABNORMAL LOW (ref 3.87–5.11)
RDW: 16 % — ABNORMAL HIGH (ref 11.5–15.5)
WBC: 11.3 10*3/uL — ABNORMAL HIGH (ref 4.0–10.5)
nRBC: 0.7 % — ABNORMAL HIGH (ref 0.0–0.2)

## 2019-03-16 LAB — RENAL FUNCTION PANEL
Albumin: 1.8 g/dL — ABNORMAL LOW (ref 3.5–5.0)
Anion gap: 14 (ref 5–15)
BUN: 30 mg/dL — ABNORMAL HIGH (ref 8–23)
CO2: 22 mmol/L (ref 22–32)
Calcium: 7.7 mg/dL — ABNORMAL LOW (ref 8.9–10.3)
Chloride: 99 mmol/L (ref 98–111)
Creatinine, Ser: 6.7 mg/dL — ABNORMAL HIGH (ref 0.44–1.00)
GFR calc Af Amer: 7 mL/min — ABNORMAL LOW (ref >60)
GFR calc non Af Amer: 6 mL/min — ABNORMAL LOW (ref >60)
Glucose, Bld: 118 mg/dL — ABNORMAL HIGH (ref 70–99)
Phosphorus: 6 mg/dL — ABNORMAL HIGH (ref 2.5–4.6)
Potassium: 4.2 mmol/L (ref 3.5–5.1)
Sodium: 135 mmol/L (ref 135–145)

## 2019-03-16 LAB — GLUCOSE, CAPILLARY
Glucose-Capillary: 106 mg/dL — ABNORMAL HIGH (ref 70–99)
Glucose-Capillary: 109 mg/dL — ABNORMAL HIGH (ref 70–99)
Glucose-Capillary: 108 mg/dL — ABNORMAL HIGH (ref 70–99)
Glucose-Capillary: 98 mg/dL (ref 70–99)

## 2019-03-16 LAB — LIPID PANEL
Cholesterol: 113 mg/dL (ref 0–200)
HDL: 32 mg/dL — ABNORMAL LOW (ref >40)
LDL Cholesterol: 62 mg/dL (ref 0–99)
Total CHOL/HDL Ratio: 3.5 RATIO
Triglycerides: 95 mg/dL (ref <150)
VLDL: 19 mg/dL (ref 0–40)

## 2019-03-16 LAB — HEMOGLOBIN A1C
Hgb A1c MFr Bld: 6.3 % — ABNORMAL HIGH (ref 4.8–5.6)
Mean Plasma Glucose: 134.11 mg/dL

## 2019-03-16 MED ORDER — HEPARIN SODIUM (PORCINE) 1000 UNIT/ML IJ SOLN
INTRAMUSCULAR | Status: AC
  Filled 2019-03-16: qty 4

## 2019-03-16 MED ORDER — VANCOMYCIN HCL IN DEXTROSE 1-5 GM/200ML-% IV SOLN
INTRAVENOUS | Status: AC
  Filled 2019-03-16: qty 200

## 2019-03-16 NOTE — Progress Notes (Signed)
Responded to page from nurse requesting spiritual visit to Mr and Mrs. Caponigro.  Mrs. Zylstra unresponsive.  Mr. Dobbie very upset b/c he was not sure what his wife's prognosis was.  Initiated a relationship of care and support for Mr. Amundson.  While there Mrs. Remington's doctor called and spoke with Mr. Nocito.  After hanging up he went to his wife's side and began speaking with her in hushed tones.  As it was a private time between the two of them, I left.  Chaplains here to assist as needed so please call if Mr. Leatherman needs additional spiritual support.  De Burrs Chaplain Resident [phone]

## 2019-03-16 NOTE — Progress Notes (Signed)
SLP Cancellation Note  Patient Details Name: Phyllis Hernandez MRN: [id number] DOB: [birth year]   Cancelled treatment:       Reason Eval/Treat Not Completed: Pt was OTF at hemodialysis at this time.  ST will f/u as schedule allows.   Bretta Bang, M.S., [place] [facility] Office: [phone]   [place] 03/16/2019, 12:06 PM

## 2019-03-16 NOTE — Evaluation (Signed)
Clinical/Bedside Swallow Evaluation Patient Details  Name: Phyllis Hernandez MRN: [id number] Date of Birth: 1951/03/01  Today's Date: 03/16/2019 Time: SLP Start Time (ACUTE ONLY): 1555 SLP Stop Time (ACUTE ONLY): 1620 SLP Time Calculation (min) (ACUTE ONLY): 25 min  Past Medical History:  Past Medical History:  Diagnosis Date  . Allergy   . Anemia   . Anemia in CKD (chronic kidney disease)   . Depression   . Diabetes mellitus without complication ([place])   . Hyperlipidemia   . Hypertension    Past Surgical History:  Past Surgical History:  Procedure Laterality Date  . AV FISTULA PLACEMENT Left 12/23/2016   Procedure: LEFT UPPER EXTREMITY ARTERIOVENOUS (AV) FISTULA CREATION;  Surgeon: Elam Dutch, MD;  Location: [place];  Service: Vascular;  Laterality: Left;  . INSERTION OF DIALYSIS CATHETER Right 12/23/2016   Procedure: INSERTION OF DIALYSIS CATHETER;  Surgeon: Elam Dutch, MD;  Location: [place];  Service: Vascular;  Laterality: Right;  . TUBAL LIGATION     HPI:  Phyllis Hernandez is a 68 yo female with a medical history of ESRD non-compliant with HD, HTN, HLD, T2DM and constipation who presented to the ED for evaluation of shortness of breath and fatigue.  Brain MRI on 9/26 identified "Multiple scattered acute ischemic infarcts involving the bilateral cerebral hemispheres." No prior known dysphagia hx.    Assessment / Plan / Recommendation Clinical Impression  Phyllis Hernandez is a 68 year old female who presents with oropharyngeal dysphagia.  Pt was encountered lethargic in bed with husband present at bedside.  Husband denied prior dysphagia hx.  Pt was unable to answer questions or follow most commands despite cuing.  Pt was observed with trials of ice chips, thin liquid, and puree.  No mastication was observed during ice chip trials despite verbal and tactile cuing.  Pt with poor bolus acceptance for all trials, and minimal R anterior labial spillage during thin liquid  trials.  Pt additionally presented with oral holding, prolonged AP transport, and suspected delayed swallow initiation.  Pt required a liquid wash via straw pipette to initiate a swallow during puree trial.  Pt's swallow function is negatively affected by lethargy and suspected cognitive deficits.  Recommend NPO with small sips of water (tsp) given by RN/NA following thorough oral care.  Medications may be administered crushed in puree if necessary given verbal cues and full supervision for swallow initiation.  SLP educated pt's husband regarding all recommendations and he verbalized understanding.   SLP Visit Diagnosis: Dysphagia, unspecified (R13.10)    Aspiration Risk  Moderate aspiration risk    Diet Recommendation NPO(small tsp of water after oral care and meds crushed in puree)   Liquid Administration via: Spoon Medication Administration: Crushed with puree Postural Changes: Seated upright at 90 degrees    Other  Recommendations Oral Care Recommendations: Oral care BID;Oral care prior to ice chip/H20   Follow up Recommendations Skilled Nursing facility;24 hour supervision/assistance      Frequency and Duration min 2x/week  2 weeks       Prognosis Prognosis for Safe Diet Advancement: Guarded Barriers to Reach Goals: Cognitive deficits      Swallow Study   General HPI: Phyllis Hernandez is a 68 yo female with a medical history of ESRD non-compliant with HD, HTN, HLD, T2DM and constipation who presented to the ED for evaluation of shortness of breath and fatigue.  Brain MRI on 9/26 identified "Multiple scattered acute ischemic infarcts involving the bilateral cerebral hemispheres." No  prior known dysphagia hx.  Type of Study: Bedside Swallow Evaluation Diet Prior to this Study: Regular;Thin liquids Temperature Spikes Noted: Yes Respiratory Status: Room air History of Recent Intubation: No Behavior/Cognition: Lethargic/Drowsy;Doesn't follow directions Oral Cavity Assessment:  Dry Oral Care Completed by SLP: No Oral Cavity - Dentition: (unable to evaluate) Self-Feeding Abilities: Total assist Patient Positioning: Upright in bed Baseline Vocal Quality: Low vocal intensity;Breathy Volitional Swallow: Able to elicit    Oral/Motor/Sensory Function Overall Oral Motor/Sensory Function: Other (comment)(Unable to evaluate )   Ice Chips Ice chips: Impaired Presentation: Spoon Oral Phase Impairments: Reduced labial seal;Impaired mastication;Poor awareness of bolus Oral Phase Functional Implications: Right anterior spillage;Prolonged oral transit;Oral holding Pharyngeal Phase Impairments: Suspected delayed Swallow   Thin Liquid Thin Liquid: Impaired Presentation: Spoon;Cup Oral Phase Impairments: Reduced labial seal;Poor awareness of bolus;Reduced lingual movement/coordination Oral Phase Functional Implications: Right anterior spillage;Oral holding Pharyngeal  Phase Impairments: Suspected delayed Swallow;Multiple swallows    Nectar Thick Nectar Thick Liquid: Not tested   Honey Thick Honey Thick Liquid: Not tested   Puree Puree: Impaired Presentation: Spoon Oral Phase Impairments: Reduced labial seal;Reduced lingual movement/coordination;Poor awareness of bolus Oral Phase Functional Implications: Prolonged oral transit;Oral holding;Oral residue Pharyngeal Phase Impairments: Suspected delayed Swallow   Solid     Bretta Bang, M.S., CCC-SLP Acute Rehabilitation Services Office: [phone]  Solid: Not tested      Phyllis Hernandez 03/16/2019,4:42 PM

## 2019-03-16 NOTE — Progress Notes (Addendum)
Subjective: Patient was still very tired appearing this morning, she reported that she was feeling fine today. Denied any pain or other complaints. We discussed the results of her MRI. We asked if she would like [place] to tell her family and she said yes.   Have attempted to contact family listed in chart, have not been able to get in contact with them and voicemail boxes are either full or not set up. Will continue to try to reach out to them.   Objective:  Vital signs in last 24 hours: Vitals:   03/15/19 2013 03/15/19 2335 03/16/19 0438 03/16/19 0439  BP: 136/62 133/60  132/61  Pulse:  83  76  Resp: 16 18  11   Temp: 98.4 F (36.9 C) 98.3 F (36.8 C)  98.2 F (36.8 C)  TempSrc: Oral Oral  Oral  SpO2: 95% 96%  99%  Weight:   95.1 kg   Height:        General: Tired appearing female, NAD, laying in bed Cardiac: RRR, no m/r/g Pulmonary: Normal work of breathing Abdomen: Soft, non-tender, non-distended, normoactive bowel sounds Extremity: BL LE wrapped in bandages Neuro: Awake, oriented to person and time, only intermittently following commands, appears slow to process information, moves all extremities, reports sensation to light touch intact  Assessment/Plan:  Principal Problem:   Volume overload Active Problems:   Type 2 diabetes mellitus with complication, without long-term current use of insulin (HCC)   ESRD (end stage renal disease) (HCC)   Anemia of chronic disease   Hypertensive urgency   Goals of care, counseling/discussion   Advance care planning   Palliative care encounter  Acute CVA: Patient was noted to have decreased responsiveness and altered mental status since her admission.  Patient appeared very fatigued however no obvious focal neurological deficits were noted.  Initially thought to be due to her uremia however has had slow improvement even with dialysis so obtained MRI yesterday.  MRI showed multiple scattered acute ischemic infarcts in the bilateral  cerebral hemispheres.  Patient is currently on aspirin and Zetia.  Will consult neurology for further evaluation and obtain stroke work-up.  - Neurology consult - Continue ASA 81 mg daily  - Echocardiogram with bubble study - A1C  - Lipid panel  - Tele monitoring  - SLP eval - PT/OT  VolumeOverload2/2 to ESRD: Likely due to missing/dismissal from HD sessions. Has been tolerating dialysis sessions. Nephrology following, reports that patient is not a candidate for long term outpatient dialysis at this time. Recommend palliative care to offer guidance. Planned HD today.   - Going for dialysis today - Nephrology following, appreciate assistance  Fever of Unknown Origin:   Patient had been febrile on 9/22. LA 1.3. WBC improved to 11 from 12.  Urinalysis showed no signs of infection.  Blood cultures showed NGTD. Unclear source of infection at this time.   -Currently on zosyn and vanc, will continue for now   HypertensiveUrgency: - Bps range 92-136/58-62  - Continue Amlodipine 10 mg QD - Continue Lopressor 25 mg BID - Continue Hydralazine PRN Q6H  Bilateral LE wounds:  -Continue Eucerin. - continue changing wrappings as needed. - Continue Dilaudid 1-2 mg IV every dialysis session - Continue Percocet/Roxicet Q4H PRN  T2DM:  - Nonadherent to home diabetes medications. CBG ranged around 107-128.   - SSI  FEN: No fluids, replete lytes prn, renal diet  VTE ppx: Heparin Code Status: FULL    Dispo: Anticipated discharge is pending clinical improvement.  Asencion Noble, MD 03/16/2019, 6:21 AM Pager: [phone]

## 2019-03-16 NOTE — Progress Notes (Signed)
  Date: 03/16/2019  Patient name: Phyllis Hernandez  Medical record number: [id number]  Date of birth: 1950-10-31   I have seen and evaluated this patient and I have discussed the plan of care with the house staff. Please see their note for complete details. I concur with their findings with the following additions/corrections:   MRI brain shows multifocal bilateral strokes, unclear etiology. Explains her change in behavior and lack of improvement with dialysis. We have consulted neurology and look forward to their recommendations.  Dr. Augustin Coupe with nephrology is not sure she will be a candidate for outpatient dialysis. Obviously, strokes may plan into this. We will proceed with her stroke workup, and continue to reassess goals of care with her and her family as we are able (currently not able to contact family).   [name], M.D., Ph.D. 03/16/2019, 2:22 PM

## 2019-03-16 NOTE — Consult Note (Signed)
NEURO HOSPITALIST CONSULT NOTE   Requesting physician: Dr. Rebeca Alert  Reason for Consult: AMS. Acute multifocal small-vessel strokes seen on MRI.   History obtained from:   Chart    HPI:                                                                                                                                          Phyllis Hernandez is an 68 y.o. female with ESRD who presented to [facility] on 9/18 with hypertensive urgency, anasarca, fatigue and progressive dyspnea with weakness after not having been to dialysis in "months" (last documented HD was 02/21/18 per chart review). She was alert and oriented x 3 on arrival and was able to converse, but mental status declined during this admission. Despite several rounds of HD, she continued to be quite somnolent and inattentive, drifting off during conversations and sometimes staring into space after being asked a question.   MRI brain was obtained, revealing multiple scattered acute ischemic infarcts involving the bilateral cerebral hemispheres, in conjunction with chronic underlying age-related cerebral atrophy with advanced chronic microvascular ischemic disease.  Neurology was consulted for further recommendations.   Past Medical History:  Diagnosis Date  . Allergy   . Anemia   . Anemia in CKD (chronic kidney disease)   . Depression   . Diabetes mellitus without complication ([place])   . Hyperlipidemia   . Hypertension     Past Surgical History:  Procedure Laterality Date  . AV FISTULA PLACEMENT Left 12/23/2016   Procedure: LEFT UPPER EXTREMITY ARTERIOVENOUS (AV) FISTULA CREATION;  Surgeon: Elam Dutch, MD;  Location: [place];  Service: Vascular;  Laterality: Left;  . INSERTION OF DIALYSIS CATHETER Right 12/23/2016   Procedure: INSERTION OF DIALYSIS CATHETER;  Surgeon: Elam Dutch, MD;  Location: [place];  Service: Vascular;  Laterality: Right;  . TUBAL LIGATION      Family History  Problem Relation Age of Onset   . Hypertension Mother   . Diabetes Mother   . Hypertension Maternal Grandmother   . Diabetes Maternal Grandmother               Social History:  reports that she has never smoked. She has never used smokeless tobacco. She reports that she does not drink alcohol or use drugs.  Allergies  Allergen Reactions  . No Known Allergies     MEDICATIONS:  Scheduled: . amLODipine  10 mg Oral Daily  . aspirin  81 mg Oral Daily  . Chlorhexidine Gluconate Cloth  6 each Topical Q0600  . Chlorhexidine Gluconate Cloth  6 each Topical Q0600  . [START ON 03/19/2019] darbepoetin (ARANESP) injection - DIALYSIS  100 mcg Intravenous Q Tue-HD  . ezetimibe  10 mg Oral Daily  . feeding supplement (NEPRO CARB STEADY)  237 mL Oral BID BM  . ferrous sulfate  325 mg Oral Q breakfast  . heparin      . heparin  5,000 Units Subcutaneous Q8H  . hydrocerin   Topical BID  . insulin aspart  0-5 Units Subcutaneous QHS  . insulin aspart  0-9 Units Subcutaneous TID WC  . mouth rinse  15 mL Mouth Rinse BID  . metoprolol tartrate  25 mg Oral BID  . multivitamin  1 tablet Oral QHS  . polyethylene glycol  17 g Oral BID  . senna  1 tablet Oral BID  . sevelamer carbonate  800 mg Oral TID WC  . sodium chloride flush  3 mL Intravenous Q12H    ROS:                                                                                                                                       Unable to obtain ROS due to AMS.  Blood pressure 134/60, pulse 88, temperature 98.8 F (37.1 C), temperature source Oral, resp. rate 16, height 5\' 4"  (1.626 m), weight 95.1 kg, SpO2 95 %.   General Examination:                                                                                                      Physical Exam  HEENT-  Normocephalic, no lesions, without obvious abnormality.  Normal external eye and conjunctiva.    Cardiovascular- , pulses palpable throughout   Lungs-no excessive working breathing.  Saturations within normal limits Extremities- Warm, dry and intact Skin-warm and dry  Neurological Examination Mental Status: Drowsy, eye opening to speech. Not speaking at all, attempted a mumbling sound. Initially she nodded appropriately that she was not at home and that she was at the hospital. Following that she did not attempt to nod to any other questions and did not follow any other commands. Asked her to stick out her tongue, wiggle her thumb, wiggle her toes and she did not follow any commands.  Also demonstrated and she still did not follow any commands. On follow up attending exam,  the patient was abulic with short, unintelligible replies to some questions after significant delay; also followed some commands after a significant delay.  Cranial Nerves: II: Unable to test visual fields.  III,IV, VI: ptosis not present, extra-ocular motion testing limited due to cooperation, she would not look all the way to the right side, pupils round, reactive to light and accommodation V,VII: face appears symmetric, she did not smile when asked. Unable to test sensation  Motor: Able to hold left arm up without drifting down. She would not participate in any strength testing.  Right arm briefly held up when lifted and then quickly drifted back down to bed. She briefly moved both legs side to side in the bed, however she would not lift them off them bed. She had pressure relief boots bilaterally. Tone and bulk:normal tone throughout; no atrophy noted Sensory: unable to test Cerebellar: unable to test   Lab Results: Basic Metabolic Panel: Recent Labs  Lab 03/10/19 1239 03/11/19 1702 03/12/19 0813 03/14/19 0737 03/15/19 0426 03/16/19 0641  NA 136 135 136 137 135 135  K 4.8 4.4 4.4 4.2 4.1 4.2  CL 106 101 102 100 98 99  CO2 13* 20* 20* 22 25 22   GLUCOSE 133* 135* 100* 115* 128* 118*  BUN 77* 45* 47* 39*  22 30*  CREATININE 11.10* 8.05* 8.67* 7.83* 5.10* 6.70*  CALCIUM 7.9* 8.5* 8.0* 7.8* 7.5* 7.7*  PHOS 7.5* 6.8* 7.4*  --  5.0* 6.0*    CBC: Recent Labs  Lab 03/12/19 0813 03/13/19 0730 03/14/19 0737 03/15/19 0426 03/16/19 0641  WBC 9.7 13.9* 13.9* 12.2* 11.3*  NEUTROABS  --  12.0*  --   --   --   HGB 7.5* 8.7* 7.7* 7.5* 7.4*  HCT 24.3* 27.7* 25.0* 24.6* 24.5*  MCV 75.0* 75.3* 74.9* 75.0* 75.2*  PLT 178 191 189 201 193    Cardiac Enzymes: No results for input(s): CKTOTAL, CKMB, CKMBINDEX, TROPONINI in the last 168 hours.  Lipid Panel: No results for input(s): CHOL, TRIG, HDL, CHOLHDL, VLDL, LDLCALC in the last 168 hours.  Imaging: Mr Brain Wo Contrast  Result Date: 03/16/2019 CLINICAL DATA:  Initial evaluation for acute altered mental status, unexplained. EXAM: MRI HEAD WITHOUT CONTRAST TECHNIQUE: Multiplanar, multiecho pulse sequences of the brain and surrounding structures were obtained without intravenous contrast. COMPARISON:  Prior CT from 03/13/2019. FINDINGS: Brain: Examination moderately degraded by motion artifact. Multiple scattered acute ischemic infarcts seen involving the bilateral cerebral hemispheres. These involve the deep and periventricular white matter of both cerebral hemispheres, with patchy involvement of the corpus callosum and basal ganglia. For reference purposes, largest area of infarction seen at the left basal ganglia and measures 12 mm. Small infarct involving the left fornix/anterior commissure noted. No associated hemorrhage or mass effect. Infarcts are predominantly small vessel type in appearance. Gray-white matter differentiation otherwise maintained. No acute intracranial hemorrhage. Underlying age-related cerebral atrophy with advanced chronic microvascular ischemic disease. No mass lesion, midline shift, or mass effect. No hydrocephalus. No extra-axial fluid collection. Vascular: Major intracranial vascular flow voids are maintained at the skull base.  Skull and upper cervical spine: Unremarkable. Sinuses/Orbits: Globes and orbital soft tissues demonstrate no acute finding. Paranasal sinuses are largely clear. No significant mastoid effusion. Other: None. IMPRESSION: 1. Multiple scattered acute ischemic infarcts involving the bilateral cerebral hemispheres as above. No associated hemorrhage or mass effect. 2. Underlying age-related cerebral atrophy with advanced chronic microvascular ischemic disease. Electronically Signed   By: Jeannine Boga M.D.   On: 03/16/2019  06:08    Assessment: 68 year old female with ESRD on HD in hospital after being off dialysis as an outpatient. Mentation did not clear after HD sessions.  1. Exam reveals no attempt at speech besides a brief mumbling sound. She was very drowsy. Initially she nodded appropriately no, she was not at home and yes, that she was at the hospital. Following that she did not attempt to nod to any other questions and did not follow any other commands. She is not following any simple commands verbally or with demonstration, except for some instances with the attending follow up exam. She did not follow for family at bedside either. Unable to get her to look all the way to the right side when testing EOM. She was able to hold left upper extremity up when it was lifted for her and I let go, it did not drift down. The right upper extremity quickly drifted to the bed when lifted. She would not participate in any strength testing. Her legs moved a bit side to side, however she made no attempt to lift them and they immediately dropped back down when lifted.  2. Overall localization from exam: Diffuse cerebral hemispheric dysfunction, most likely secondary to diffuse brain edema in the setting of dialysis dysequilibrium syndrome.  3. MRI brain reveals: Multiple scattered acute ischemic infarcts involving the bilateral cerebral hemispherese. No associated hemorrhage or mass effect. Underlying age-related  cerebral atrophy with advanced chronic microvascular ischemic disease.  4. Etiology for the strokes is most likely small-vessel vasospasm given multifocal distribution in the deep white matter. The strokes are not in a distribution typical for cardioembolic source, as there are no cortical-juxtacortical acute infarctions.   Recommendations: 1. MRA head 2. Carotid ultrasound.  3. TTE 4. BP management 5. Continue ASA 6. PT/OT/Speech  Electronically signed: Dr. Kerney Elbe 03/16/2019, 9:06 AM

## 2019-03-16 NOTE — Progress Notes (Signed)
During AM bath, pt's bedpad noted to be soaked with urine. Bladder scanned for residual: 21mLs. Pt shakes head when asked if need to pee. Going down to MRI at this time.

## 2019-03-16 NOTE — Progress Notes (Signed)
[facility] Progress Note    Assessment/ Plan:   1. ESRD - Started HD 12/2016 and went through 02/21/2018 (last HD).Did not return to dialysis and was thereforedischarged by [facility] for severe noncompliance. Ptdoes not currently have an OP HD unit. Not sure that patient will agree to long-term dialysis, she has agreed to trying in-hospital dialysis.Will consult palliative care for [place]. She c/o severe leg pains on HD which for now at least will try to control w/ IV/ po pain meds. 1st HD on Friday pt only stayed on machine 1 hour , poor UF 600 cc, signed off due to pain. 2nd HDSundaythenrefused HD 9/21.  - Was going to re-CLIP but she just does not look well and certainly  Not a candidate for long term outpt dialysis. I believe there needs to be a family meeting with discussion of expectations and alternatives. NOT a outpt candidate at this time and would have palliative see her again to offer guidance. I believe what will happen is she will be placed, not show up for dialysis, or run for a short period of time signing off early.  Next HD today.  2. Hypertensive/ volume:Norvasc10mg , IV prn and metoprolol25mg  BID. Sig vol overload, aggressive UF.  3. Anemia - HGB 8.7.Give ESA with HD today. Order iron panel. 4. Metabolic bone disease - Check phos/PTH. Resume binders/VDRA when labs available. Ca 8.2.P 6.8 -> renvela 800mg  TIDM (start 9/22). Will recheck phos on Mon 5. Nutrition - Renal/Carb mod diet.  6. Wounds bilateral LE-Wound consult. Lower volume.  Subjective:   Confused with a blank stare; answers to questions but not convinced she understands.. No change. Fortunately cooperative.   Objective:   BP 132/61 (BP Location: Right Wrist)   Pulse 76   Temp 98.2 F (36.8 C) (Oral)   Resp 11   Ht 5\' 4"  (1.626 m)   Wt 95.1 kg   SpO2 99%   BMI 35.99 kg/m   Intake/Output Summary (Last 24 hours) at 03/16/2019 0754 Last data filed at 03/15/2019 2217 Gross  per 24 hour  Intake 150 ml  Output -  Net 150 ml   Weight change: -1.1 kg  Physical Exam: General:Chronically ill appearing older femalein no acute distress, up in chair and sleeping Neck: Supple. JVD1/2 to mandible Lungs:Few crackles L base, otherwise CTAB Heart:HS distant, S1,S2. RRR. Abdomen:Obese, active BS,probable ascites present M-S: Strength and tone appear normal for age. Lower extremities:Woody appearance BLE with open blisters on BLE. 2+ BLE edema. Neuro: Alert and oriented X 3. Moves all ext, gen weak and no asterixis or confusion Dialysis Access:[facility]  Imaging: Mr Brain Wo Contrast  Result Date: 03/16/2019 CLINICAL DATA:  Initial evaluation for acute altered mental status, unexplained. EXAM: MRI HEAD WITHOUT CONTRAST TECHNIQUE: Multiplanar, multiecho pulse sequences of the brain and surrounding structures were obtained without intravenous contrast. COMPARISON:  Prior CT from 03/13/2019. FINDINGS: Brain: Examination moderately degraded by motion artifact. Multiple scattered acute ischemic infarcts seen involving the bilateral cerebral hemispheres. These involve the deep and periventricular white matter of both cerebral hemispheres, with patchy involvement of the corpus callosum and basal ganglia. For reference purposes, largest area of infarction seen at the left basal ganglia and measures 12 mm. Small infarct involving the left fornix/anterior commissure noted. No associated hemorrhage or mass effect. Infarcts are predominantly small vessel type in appearance. Gray-white matter differentiation otherwise maintained. No acute intracranial hemorrhage. Underlying age-related cerebral atrophy with advanced chronic microvascular ischemic disease. No mass lesion, midline shift, or mass  effect. No hydrocephalus. No extra-axial fluid collection. Vascular: Major intracranial vascular flow voids are maintained at the skull base. Skull and upper cervical spine: Unremarkable.  Sinuses/Orbits: Globes and orbital soft tissues demonstrate no acute finding. Paranasal sinuses are largely clear. No significant mastoid effusion. Other: None. IMPRESSION: 1. Multiple scattered acute ischemic infarcts involving the bilateral cerebral hemispheres as above. No associated hemorrhage or mass effect. 2. Underlying age-related cerebral atrophy with advanced chronic microvascular ischemic disease. Electronically Signed   By: Jeannine Boga M.D.   On: 03/16/2019 06:08    Labs: BMET Recent Labs  Lab 03/10/19 1239 03/11/19 1702 03/12/19 0813 03/14/19 0737 03/15/19 0426 03/16/19 0641  NA 136 135 136 137 135 135  K 4.8 4.4 4.4 4.2 4.1 4.2  CL 106 101 102 100 98 99  CO2 13* 20* 20* 22 25 22   GLUCOSE 133* 135* 100* 115* 128* 118*  BUN 77* 45* 47* 39* 22 30*  CREATININE 11.10* 8.05* 8.67* 7.83* 5.10* 6.70*  CALCIUM 7.9* 8.5* 8.0* 7.8* 7.5* 7.7*  PHOS 7.5* 6.8* 7.4*  --  5.0* 6.0*   CBC Recent Labs  Lab 03/13/19 0730 03/14/19 0737 03/15/19 0426 03/16/19 0641  WBC 13.9* 13.9* 12.2* 11.3*  NEUTROABS 12.0*  --   --   --   HGB 8.7* 7.7* 7.5* 7.4*  HCT 27.7* 25.0* 24.6* 24.5*  MCV 75.3* 74.9* 75.0* 75.2*  PLT 191 189 201 193    Medications:    . amLODipine  10 mg Oral Daily  . aspirin  81 mg Oral Daily  . Chlorhexidine Gluconate Cloth  6 each Topical Q0600  . Chlorhexidine Gluconate Cloth  6 each Topical Q0600  . [START ON 03/19/2019] darbepoetin (ARANESP) injection - DIALYSIS  100 mcg Intravenous Q Tue-HD  . ezetimibe  10 mg Oral Daily  . feeding supplement (NEPRO CARB STEADY)  237 mL Oral BID BM  . ferrous sulfate  325 mg Oral Q breakfast  . heparin  5,000 Units Subcutaneous Q8H  . hydrocerin   Topical BID  . insulin aspart  0-5 Units Subcutaneous QHS  . insulin aspart  0-9 Units Subcutaneous TID WC  . mouth rinse  15 mL Mouth Rinse BID  . metoprolol tartrate  25 mg Oral BID  . multivitamin  1 tablet Oral QHS  . polyethylene glycol  17 g Oral BID  . senna   1 tablet Oral BID  . sevelamer carbonate  800 mg Oral TID WC  . sodium chloride flush  3 mL Intravenous Q12H      [name], MD 03/16/2019, 7:54 AM

## 2019-03-17 ENCOUNTER — Inpatient Hospital Stay (HOSPITAL_COMMUNITY): Payer: Medicare Other

## 2019-03-17 DIAGNOSIS — I639 Cerebral infarction, unspecified: Secondary | ICD-10-CM

## 2019-03-17 DIAGNOSIS — I361 Nonrheumatic tricuspid (valve) insufficiency: Secondary | ICD-10-CM

## 2019-03-17 LAB — GLUCOSE, CAPILLARY
Glucose-Capillary: 102 mg/dL — ABNORMAL HIGH (ref 70–99)
Glucose-Capillary: 96 mg/dL (ref 70–99)
Glucose-Capillary: 90 mg/dL (ref 70–99)
Glucose-Capillary: 75 mg/dL (ref 70–99)

## 2019-03-17 MED ORDER — LABETALOL HCL 5 MG/ML IV SOLN
10.0000 mg | Freq: Two times a day (BID) | INTRAVENOUS | Status: DC
  Administered 2019-03-17: 19:00:00 10 mg via INTRAVENOUS
  Filled 2019-03-17: qty 4

## 2019-03-17 MED ORDER — METOPROLOL TARTRATE 5 MG/5ML IV SOLN
5.0000 mg | Freq: Four times a day (QID) | INTRAVENOUS | Status: DC
  Administered 2019-03-17 – 2019-03-23 (×20): 5 mg via INTRAVENOUS
  Filled 2019-03-17 (×22): qty 5

## 2019-03-17 MED ORDER — ASPIRIN 300 MG RE SUPP
300.0000 mg | Freq: Every day | RECTAL | Status: DC
  Administered 2019-03-17 – 2019-03-24 (×8): 300 mg via RECTAL
  Filled 2019-03-17 (×8): qty 1

## 2019-03-17 NOTE — Progress Notes (Signed)
Bilateral carotid duplex completed. Preliminary results in Chart review CV Proc. [place] Aryiana Klinkner,RVS 03/17/2019, 4:44 PM

## 2019-03-17 NOTE — Progress Notes (Signed)
  Speech Language Pathology Treatment: Dysphagia  Patient Details Name: Phyllis Hernandez MRN: [id number] DOB: [birth year] Today's Date: 03/17/2019 Time: [id number] SLP Time Calculation (min) (ACUTE ONLY): 23 min  Assessment / Plan / Recommendation Clinical Impression  Pt was encountered asleep in bed and roused intermittently to max verbal and tactile cues.  Pt was seen with trials of thin liquid and attempted medications crushed in puree administered by RN.  Pt exhibited poor bolus acceptance with all trials despite max verbal and tactile cues.  She additionally exhibited R anterior labial spillage with thin liquid trial.  Pt did not attempted lingual manipulation or swallow initiation with trials of thin liquid or puree and required suctioning to clear her oral cavity following trials.  Pt was noted to have bolus pocketing in her R buccal cavity.  Oral care was then completed with suction swab.  Recommend continuation of NPO with frequent oral care and consideration of alternate means of nutrition vs change in code and liberalized diet per pt/family wishes.  ST will continue to follow per POC.    HPI HPI: Phyllis Hernandez is a 68 yo female with a medical history of ESRD non-compliant with HD, HTN, HLD, T2DM and constipation who presented to the ED for evaluation of shortness of breath and fatigue.  Brain MRI on 9/26 identified "Multiple scattered acute ischemic infarcts involving the bilateral cerebral hemispheres." No prior known dysphagia hx.       SLP Plan  Continue with current plan of care       Recommendations  Diet recommendations: NPO Medication Administration: Via alternative means                Oral Care Recommendations: Oral care QID Follow up Recommendations: Skilled Nursing facility;24 hour supervision/assistance SLP Visit Diagnosis: Dysphagia, unspecified (R13.10) Plan: Continue with current plan of care       Bretta Bang, M.S., [place] Office: [phone]               [place] 03/17/2019, 9:51 AM

## 2019-03-17 NOTE — Progress Notes (Signed)
STROKE TEAM PROGRESS NOTE   HISTORY OF PRESENT ILLNESS (per record) Phyllis Hernandez is an 68 y.o. female with ESRD who presented to [facility] on 9/18 with hypertensive urgency, anasarca, fatigue and progressive dyspnea with weakness after not having been to dialysis in "months" (last documented HD was 02/21/18 per chart review). She was alert and oriented x 3 on arrival and was able to converse, but mental status declined during this admission. Despite several rounds of HD, she continued to be quite somnolent and inattentive, drifting off during conversations and sometimes staring into space after being asked a question.   MRI brain was obtained, revealing multiple scattered acute ischemic infarcts involving the bilateral cerebral hemispheres, in conjunction with chronic underlying age-related cerebral atrophy with advanced chronic microvascular ischemic disease.  Neurology was consulted for further recommendations.    INTERVAL HISTORY Her family is not at bedside. Nurse present. Non verbal. Nodding yes to "are u ok" smiled for me. Tracks from left but does not x midline. Blinks threat bilat. Getting echo at the moment. Husband is not dealing with her stroke, talking to him is difficult poor understanding or in shock recently lost is brother per nurse. She had not gone to dialysis for 6-9 months. No changes overnight and today, neuro stable, mental status stable.Not swallowing, speech eval x 2.    OBJECTIVE Vitals:   03/17/19 0425 03/17/19 0815 03/17/19 0909 03/17/19 1200  BP: 128/64 110/65  (!) 116/57  Pulse: 80 78 81 78  Resp: 13 19  (!) 0  Temp: 99.1 F (37.3 C) 98.8 F (37.1 C)    TempSrc: Oral Oral    SpO2: 96% 97%  97%  Weight: 92 kg     Height:        CBC:  Recent Labs  Lab 03/13/19 0730  03/15/19 0426 03/16/19 0641  WBC 13.9*   < > 12.2* 11.3*  NEUTROABS 12.0*  --   --   --   HGB 8.7*   < > 7.5* 7.4*  HCT 27.7*   < > 24.6* 24.5*  MCV 75.3*   < > 75.0* 75.2*  PLT 191   < >  201 193   < > = values in this interval not displayed.    Basic Metabolic Panel:  Recent Labs  Lab 03/15/19 0426 03/16/19 0641  NA 135 135  K 4.1 4.2  CL 98 99  CO2 25 22  GLUCOSE 128* 118*  BUN 22 30*  CREATININE 5.10* 6.70*  CALCIUM 7.5* 7.7*  PHOS 5.0* 6.0*    Lipid Panel:     Component Value Date/Time   CHOL 113 03/16/2019 0624   TRIG 95 03/16/2019 0624   HDL 32 (L) 03/16/2019 0624   CHOLHDL 3.5 03/16/2019 0624   VLDL 19 03/16/2019 0624   LDLCALC 62 03/16/2019 0624   HgbA1c:  Lab Results  Component Value Date   HGBA1C 6.3 (H) 03/16/2019   Urine Drug Screen: No results found for: LABOPIA, COCAINSCRNUR, LABBENZ, AMPHETMU, THCU, LABBARB  Alcohol Level No results found for: [facility]  IMAGING  Mr Brain Wo Contrast 03/16/2019 IMPRESSION:  1. Multiple scattered acute ischemic infarcts involving the bilateral cerebral hemispheres as above. No associated hemorrhage or mass effect.  2. Underlying age-related cerebral atrophy with advanced chronic microvascular ischemic disease.     Transthoracic Echocardiogram  9/227/20 IMPRESSIONS  1. Left ventricular ejection fraction, by visual estimation, is 55 to 60%. The left ventricle has normal function. Normal left ventricular size. There is no left  ventricular hypertrophy.  2. Elevated mean left atrial pressure.  3. Left ventricular diastolic Doppler parameters are consistent with pseudonormalization pattern of LV diastolic filling.  4. Global right ventricle has mildly reduced systolic function.The right ventricular size is moderately enlarged. Mildly increased right ventricular wall thickness.  5. Left atrial size was moderately dilated.  6. Right atrial size was moderately dilated.  7. The mitral valve is normal in structure. Trace mitral valve regurgitation.  8. The tricuspid valve is grossly normal. Tricuspid valve regurgitation moderate-severe.  9. The aortic valve is normal in structure. Aortic valve regurgitation was  not visualized by color flow Doppler. 10. The pulmonic valve was grossly normal. Pulmonic valve regurgitation is not visualized by color flow Doppler. 11. Severely elevated pulmonary artery systolic pressure, estimated at 74 mm Hg. 12. The inferior vena cava is dilated in size with <50% respiratory variability, suggesting right atrial pressure of 15 mmHg.   Bilateral Carotid Dopplers  00/00/2020 Pending   ECG - SR rate 87  BPM. Prolonged QT interval (See cardiology reading for complete details)   PHYSICAL EXAM Blood pressure (!) 116/57, pulse 78, temperature 98.8 F (37.1 C), temperature source Oral, resp. rate (!) 0, height 5\' 4"  (1.626 m), weight 92 kg, SpO2 97 %.   Exam: NAD, pleasantly sitting up in bed, echo at bedside.              Speech:   Non verbal..  Cognition:   Nodding yes to "are u ok", smiled for me. Tracks from left but does not cross midline. Can follow simple commands wit visual cues.    Cranial Nerves:    The pupils are equal, round, and reactive to light.Trigeminal sensation non cooperative  Blinks threat bilat. The face is symmetric. The palate elevates in the midline. Hearing intact to voice. Non verbal. Shoulder shrug noncooperative. The tongue has normal motion without fasciculations.   Coordination:  not cooperative, no dysmetria noed  Motor Observation:    No asymmetry, no atrophy, and no involuntary movements noted. Tone:    Normal muscle tone.     Strength:    Not cooperative, no focal deficits noted, moving all extremities spontaneously and equally on limited exam     Sensation: intact to LT   ASSESSMENT/PLAN [name] is a 68 y.o. female with history of ESRD, HTN, HLD, DM, anemia and depression presenting with AMS. She did not receive IV t-PA due to unknown time of onset.   Stroke: Multiple bilateral infarcts - embolic vs cerebrovascular  Vasospasm.  Code Stroke CT Head - not ordered  CT head - Mild chronic ischemic white  matter disease. No acute intracranial abnormality seen.  MRI head - Multiple scattered acute ischemic infarcts involving the bilateral cerebral hemispheres  MRA head - not ordered but may consider to evaluate for vasospasm. Will defer to primary team teaching service, unclear family goas at this time but palliative care consulted.  CTA H&N - not ordered, creatinine 6.7  CT Perfusion - not ordered  Carotid Doppler - pending  2D Echo - EF 55 - 60%. No cardiac source of emboli identified.   Sars Corona Virus 2  - negative  LDL - 62  HgbA1c - 6.3  UDS - not ordered  VTE prophylaxis - [place] Heparin Diet  Diet Order            Diet NPO time specified Except for: Other (See Comments)  Diet effective now  No antithrombotic prior to admission, now on aspirin 81 mg daily  Patient counseled to be compliant with her antithrombotic medications  Ongoing aggressive stroke risk factor management  Therapy recommendations:  pending  Disposition:  Pending  Hypertension  Home BP meds: none  Current BP meds: norvasc  Stable . Permissive hypertension (OK if < 220/120) but gradually normalize in 5-7 days . Long-term BP goal normotensive  Hyperlipidemia  Home Lipid lowering medication: none  LDL 62,  goal < 70  Current lipid lowering medication: Zetia 10 mg daily  Continue statin at discharge  Diabetes  Home diabetic meds: Insulin  Current diabetic meds:  HgbA1c 6.3, goal < 7.0 Recent Labs    03/17/19 0604 03/17/19 1241 03/17/19 1612  GLUCAP 102* 96 90    Other Stroke Risk Factors  Advanced age  Obesity, Body mass index is 34.81 kg/m., recommend weight loss, diet and exercise as appropriate   Other Active Problems  ESRD  Anemia  Mild Leukocytosis - (temp - 99.1)   Hospital day # 9  Embolic vs Vasospasm. Consider MRA of the head per primary team and goals of family. Palliative consult in chart.  Personally examined patient and images,  and have participated in and made any corrections needed to history, physical, neuro exam,assessment and plan as stated above.  I have personally obtained the history, evaluated lab date, reviewed imaging studies and agree with radiology interpretations.    Sarina Ill, MD Stroke Neurology   A total of 35 minutes was spent for the care of this patient, spent on counseling patient and family on different diagnostic and therapeutic options, counseling and coordination of care, riskd ans benefits of management, compliance, or risk factor reduction and education.  To contact Stroke Continuity provider, please refer to [email or web address] After hours, contact General Neurology

## 2019-03-17 NOTE — Progress Notes (Signed)
[facility] Progress Note    Assessment/ Plan:   1. ESRD - Started HD 12/2016 and went through 02/21/2018 (last HD).Did not return to dialysis and was thereforedischarged by [facility] for severe noncompliance. Ptdoes not currently have an OP HD unit. Not sure that patient will agree to long-term dialysis, she has agreed to trying in-hospital dialysis.Will consult palliative care for [place]. She c/o severe leg pains on HD which for now at least will try to control w/ IV/ po pain meds. 1st HD on Friday pt only stayed on machine 1 hour , poor UF 600 cc, signed off due to pain. 2nd HDSundaythenrefused HD 9/21.  - Was going tore-CLIPbut she just does not look well and certainly Not a candidate for long term outpt dialysis. I believe there needs to be a family meeting with discussion of expectations and alternatives. NOT a outpt candidate at this time and would have palliative see her again to offer guidance. I believe what will happen is she will be placed, not show up for dialysis, or run for a short period of time signing off early.  Last HD Saturday.  2. Hypertensive/ volume:Norvasc10mg , IV prn and metoprolol25mg  BID. Sig vol overload, aggressive UF.  3. Anemia - HGB 8.7.Give ESA with HD today. Order iron panel. 4. Metabolic bone disease - Check phos/PTH. Resume binders/VDRA when labs available. Ca 8.2.P 6.8 -> renvela 800mg  TIDM (start 9/22). Will recheck phos on Mon 5. Nutrition - Renal/Carb mod diet.  6. Wounds bilateral LE-Wound consult. Lower volume.  Subjective:   Much more somnolent this am   Objective:   BP 110/65 (BP Location: Right Arm)   Pulse 81   Temp 98.8 F (37.1 C) (Oral)   Resp 19   Ht 5\' 4"  (1.626 m)   Wt 92 kg   SpO2 97%   BMI 34.81 kg/m   Intake/Output Summary (Last 24 hours) at 03/17/2019 1019 Last data filed at 03/16/2019 1625 Gross per 24 hour  Intake 30 ml  Output 2500 ml  Net -2470 ml   Weight change: -3.5 kg  Physical  Exam: General:Chronically ill appearing older femalein no acute distress, resting comfortably Neck: Supple. JVD1/2 to mandible Lungs:poor air movement Heart:HS distant, S1,S2. RRR. Abdomen:Obese, active BS,probable ascites present Lower extremities:Woody appearance BLE with open blisters on BLE. 2+ BLE edema. Neuro: more somnolent [facility]ialysis [facility]  Imaging: Mr Brain Wo Contrast  Result Date: 03/16/2019 CLINICAL DATA:  Initial evaluation for acute altered mental status, unexplained. EXAM: MRI HEAD WITHOUT CONTRAST TECHNIQUE: Multiplanar, multiecho pulse sequences of the brain and surrounding structures were obtained without intravenous contrast. COMPARISON:  Prior CT from 03/13/2019. FINDINGS: Brain: Examination moderately degraded by motion artifact. Multiple scattered acute ischemic infarcts seen involving the bilateral cerebral hemispheres. These involve the deep and periventricular white matter of both cerebral hemispheres, with patchy involvement of the corpus callosum and basal ganglia. For reference purposes, largest area of infarction seen at the left basal ganglia and measures 12 mm. Small infarct involving the left fornix/anterior commissure noted. No associated hemorrhage or mass effect. Infarcts are predominantly small vessel type in appearance. Gray-white matter differentiation otherwise maintained. No acute intracranial hemorrhage. Underlying age-related cerebral atrophy with advanced chronic microvascular ischemic disease. No mass lesion, midline shift, or mass effect. No hydrocephalus. No extra-axial fluid collection. Vascular: Major intracranial vascular flow voids are maintained at the skull base. Skull and upper cervical spine: Unremarkable. Sinuses/Orbits: Globes and orbital soft tissues demonstrate no acute finding. Paranasal sinuses are largely clear. No  significant mastoid effusion. Other: None. IMPRESSION: 1. Multiple scattered acute ischemic infarcts involving  the bilateral cerebral hemispheres as above. No associated hemorrhage or mass effect. 2. Underlying age-related cerebral atrophy with advanced chronic microvascular ischemic disease. Electronically Signed   By: Jeannine Boga M.D.   On: 03/16/2019 06:08    Labs: BMET Recent Labs  Lab 03/10/19 1239 03/11/19 1702 03/12/19 0813 03/14/19 0737 03/15/19 0426 03/16/19 0641  NA 136 135 136 137 135 135  K 4.8 4.4 4.4 4.2 4.1 4.2  CL 106 101 102 100 98 99  CO2 13* 20* 20* 22 25 22   GLUCOSE 133* 135* 100* 115* 128* 118*  BUN 77* 45* 47* 39* 22 30*  CREATININE 11.10* 8.05* 8.67* 7.83* 5.10* 6.70*  CALCIUM 7.9* 8.5* 8.0* 7.8* 7.5* 7.7*  PHOS 7.5* 6.8* 7.4*  --  5.0* 6.0*   CBC Recent Labs  Lab 03/13/19 0730 03/14/19 0737 03/15/19 0426 03/16/19 0641  WBC 13.9* 13.9* 12.2* 11.3*  NEUTROABS 12.0*  --   --   --   HGB 8.7* 7.7* 7.5* 7.4*  HCT 27.7* 25.0* 24.6* 24.5*  MCV 75.3* 74.9* 75.0* 75.2*  PLT 191 189 201 193    Medications:    . amLODipine  10 mg Oral Daily  . aspirin  81 mg Oral Daily  . Chlorhexidine Gluconate Cloth  6 each Topical Q0600  . Chlorhexidine Gluconate Cloth  6 each Topical Q0600  . [START ON 03/19/2019] darbepoetin (ARANESP) injection - DIALYSIS  100 mcg Intravenous Q Tue-HD  . ezetimibe  10 mg Oral Daily  . feeding supplement (NEPRO CARB STEADY)  237 mL Oral BID BM  . ferrous sulfate  325 mg Oral Q breakfast  . heparin  5,000 Units Subcutaneous Q8H  . hydrocerin   Topical BID  . insulin aspart  0-5 Units Subcutaneous QHS  . insulin aspart  0-9 Units Subcutaneous TID WC  . mouth rinse  15 mL Mouth Rinse BID  . metoprolol tartrate  25 mg Oral BID  . multivitamin  1 tablet Oral QHS  . polyethylene glycol  17 g Oral BID  . senna  1 tablet Oral BID  . sevelamer carbonate  800 mg Oral TID WC  . sodium chloride flush  3 mL Intravenous Q12H      [name], MD 03/17/2019, 10:19 AM

## 2019-03-17 NOTE — Progress Notes (Signed)
Worked with the speech pathologist today with administering medications.  Patient responded poorly and was unable to swallow any medications even when crushed in apple sauce.  MD notified of patient's inability to swallow any oral medications. 

## 2019-03-17 NOTE — Progress Notes (Signed)
  Echocardiogram 2D Echocardiogram has been performed.  Aruna Nestler L Androw 03/17/2019, 4:39 PM

## 2019-03-17 NOTE — Consult Note (Signed)
Responded to page from front desk in hospital that pt's husband & clergy could not both visit, & would I speak to pt's pastor. I did on phone, and, after checking with charge Erasmo Downer on visiting policy, went outside to meet with pt's husband Maurice Small, pt''s pastor Bishop Darl Householder & his wife, also pastor, Mahlon Gammon. Pt was long-time member of their church, & Harmon Pier said she knew her 40+ yrs.   Discussions ensued to clarify visiting policy with charge Chaves Ron, & [facility] Ron & me. I'd told Dionicia Abler that outside pastors were now allowed to visit. The issue was, as [facility] Ron explained, that the rule that one person could be in room at a time now allowed that person to be switched out throughoutt day (per change in policy yesterday). So [place] visited & prayed for pt-- after pt's husband [name] made brief visit, then  sat with Rev. Mahlon Gammon outside.  Before that occurred, I reiterated to pt's husband [name]  in the presence of their pastors what Erasmo Downer had conveyed to me she'd told him  re: pt's condition: that doctors wanted to have a goals of care meeting with the family, that the time may be coming for them together to make a decision to place pt on comfort care--and what that was. The  Bishop said Kyrstie Thi was also grieving the loss of his brother last week, and asked had pt's husband not fully understood what he'd been told. Addressing & making eye contact with pt's husband, I said, Yes,, the charge believed not, and of course you are grieving and overwhelmed and might not have fully understood what she was saying then. I too found him confused at first, but our clarifying conversations helped him focus.  Bottom-line  re:/ from pt's husband:: Per the Bishop, Maurice Small, though separated from his wife, is still legally married to her. They have 3 sons, 2 are incarcerated, and Legrand Como is in town. Maurice Small does not want to see his wife much any more: it makes him too upset. He understands  doctors will also want to meet with their son Legrand Como, and he would like to be advised, and for the [place] to remain pt's contact too, so he can have spiritual solace from him and the Bishop knows what's going on and can explain it to him if need be. I left pt's nurse and the [place] conversing re: this information. Staff will page again if further chaplain services needed.  Rev. Eloise Levels Chaplain

## 2019-03-17 NOTE — Progress Notes (Signed)
Multiple discussions today with husband Elisha Ponder, and MDs.  Husband Jeneen Rinks' brother died very recently and the current medical situation with his wife has put him into what appears to be severe shock.  Husband cannot seem to process any medical information provided to him and continues to ask the same questions repeatedly even after reeducation.  Benson [place], who has significant history with family, has stated that Jeneen Rinks did not have any communication issues prior to recent events.    Husband Jeneen Rinks has verbally stated that due to his current state he would like either of the pastors listed in the Pt's medical contacts to be included as much as possible in future communication to help facilitate both his understanding of medical information and to aid in  accurate communication of medical information to the rest of the family.  There is no intention for pastors to be involved in any medical decision making, only helping John to understand information accurately and communicate to other family members.   Almyra Brace [phone]  Benson Norway [phone]

## 2019-03-17 NOTE — Progress Notes (Addendum)
   Subjective:  Phyllis Hernandez was seen at bedside this morning. She is arousable to verbal stimuli, but has decreased respondence. She can answer simple questions by nodding and shaking her heard. We informed her of the results from the imaging.    Objective:  Vital signs in last 24 hours: Vitals:   03/16/19 1948 03/16/19 2112 03/17/19 0109 03/17/19 0425  BP:  130/68 123/85 128/64  Pulse: 82 88 94 80  Resp: 16  20 13   Temp: 99.2 F (37.3 C)  98.5 F (36.9 C) 99.1 F (37.3 C)  TempSrc: Oral  Oral Oral  SpO2: 96%  96% 96%  Weight:    92 kg  Height:       Physical Exam Constitutional:      Appearance: She is obese.     Comments: Female, responsive to voice and touch. Cooperates by shaking and nodding head.   HENT:     Head: Normocephalic and atraumatic.  Cardiovascular:     Rate and Rhythm: Normal rate and regular rhythm.     Heart sounds: No murmur. No friction rub. No gallop.   Pulmonary:     Effort: Pulmonary effort is normal.     Breath sounds: No wheezing, rhonchi or rales.  Abdominal:     General: Bowel sounds are normal. There is no distension.     Palpations: Abdomen is soft.  Neurological:     Comments: Attempts to follow commands. But due to diminished states cannot fully participate in neuro examination.      Assessment/Plan:  Principal Problem:   Volume overload Active Problems:   Type 2 diabetes mellitus with complication, without long-term current use of insulin (HCC)   ESRD (end stage renal disease) (HCC)   Anemia of chronic disease   Hypertensive urgency   Goals of care, counseling/discussion   Advance care planning   Palliative care encounter Assessment:   Acute CVA: - MRI shows multiple scattered acute ischemic infarcts involving the bilateral cerebral hemispheres. Underlying age related cerebral atrophy with advanced chronic microvascular ischemic disease.   - We appreciate Neurology's assistance with Phyllis Hernandez's medical care  - Vas US carotid:  today  - Echocardiogram complete bubble study: today  - Lipid panel: Cholesterol 113, HDL 32, LDL 62  - A1c: 6.3  - Continue tele monitoring - SLP eval: Recommended an NPO diet with small tsp of water after oral care and meds crushed into puree. They recommend SNF with 24 hour supervision. We appreciate their assistance.  - D/C ASA 81 mg daily  - Ordered ASA suppository 300 mg QD - PT/OT  VolumeOverload2/2 to ESRD: - Last dialysis session 03/16/2019 - Nephrology following, appreciate assistance  Fever of Unknown Origin:   -Currently on zosyn and vanc, will continue for now   HypertensiveUrgency: - Bps range 111-165/42-58 - Continue Amlodipine 10 mg QD - Continue Lopressor 25 mg BID - Continue Hydralazine PRN Q6H - Continue Metoprolol IV  Bilateral LE wounds:  -Continue Eucerin. - continue changing wrappings as needed. - Continue Dilaudid 1-2 mg IV every dialysis session - Continue Percocet/Roxicet Q4H PRN  T2DM:  - Nonadherent to home diabetes medications.  - CBG ranged around 107-128.  - Continue SSI  FEN: No fluids, replete lytes prn, renal diet  VTE ppx: Heparin Code Status: FULL    Dispo: Anticipated discharge is pending clinical improvement.   Maudie Mercury, MD 03/17/2019, 6:51 AM Pager: [phone]

## 2019-03-18 ENCOUNTER — Inpatient Hospital Stay (HOSPITAL_COMMUNITY): Payer: Medicare Other

## 2019-03-18 DIAGNOSIS — I639 Cerebral infarction, unspecified: Secondary | ICD-10-CM | POA: Clinically undetermined

## 2019-03-18 DIAGNOSIS — I160 Hypertensive urgency: Secondary | ICD-10-CM

## 2019-03-18 LAB — CULTURE, BLOOD (ROUTINE X 2)
Culture: NO GROWTH
Culture: NO GROWTH

## 2019-03-18 LAB — GLUCOSE, CAPILLARY
Glucose-Capillary: 98 mg/dL (ref 70–99)
Glucose-Capillary: 117 mg/dL — ABNORMAL HIGH (ref 70–99)
Glucose-Capillary: 129 mg/dL — ABNORMAL HIGH (ref 70–99)
Glucose-Capillary: 86 mg/dL (ref 70–99)

## 2019-03-18 MED ORDER — SEVELAMER CARBONATE 800 MG PO TABS
2400.0000 mg | ORAL_TABLET | Freq: Three times a day (TID) | ORAL | Status: DC
  Filled 2019-03-18 (×3): qty 3

## 2019-03-18 NOTE — Progress Notes (Signed)
Occupational Therapy Treatment Patient Details Name: Phyllis Hernandez MRN: [id number] DOB: 1950/10/13 Today's Date: 03/18/2019    History of present illness Pt is a 68 y.o. female admitted 03/08/19 with volume overload and hypertensive overload after missing HD sessions (per chart, has missed HD for approximately one year); pt opting to resume HD while admitted, but unable to tolerate complete session due to BLE pain. PMH includes ESRD (on HD), DM2, HTN, depression.   OT comments  Pt making steady progress towards OT goals this session. Pt agreeable to session; flat affect overall, but much more pleasant and participatory this session. Pt able to wash face/ hands supine in bed with increased time to initiate with  verbal/ tactile cues. Pt Total - MAX A +2 for bed mobility but able to sit EOB ~ 6 minutes with CGA- MIN A for sitting balance. Positioned pt in chair position in bed with all needs met with husband present.  DC plan remains appropriate. Will continue to follow acutely for OT needs.    Follow Up Recommendations  Supervision/Assistance - 24 hour;SNF    Equipment Recommendations  3 in 1 bedside commode    Recommendations for Other Services      Precautions / Restrictions Precautions Precautions: Fall;Other (comment) Precaution Comments: Bilateral feet/lower leg wounds wrapped in ace wrap, watch BP Restrictions Weight Bearing Restrictions: No       Mobility Bed Mobility Overal bed mobility: Needs Assistance Bed Mobility: Supine to Sit;Sit to Supine     Supine to sit: Total assist;+2 for physical assistance;HOB elevated Sit to supine: Max assist;+2 for physical assistance   General bed mobility comments: decreased initiation; tactile cues to BLEs to initate advancing BLEs to EOB; MAX A to helicopter to EOB but pt not resisting movement this session  Transfers                 General transfer comment: unable to attempt d/t weakness    Balance Overall balance  assessment: Needs assistance Sitting-balance support: Bilateral upper extremity supported;Feet supported Sitting balance-Leahy Scale: Poor Sitting balance - Comments: MIN A- CGA to sit EOb ~ 81mnutes; pt with slight posterior lean Postural control: Posterior lean                                 ADL either performed or assessed with clinical judgement   ADL Overall ADL's : Needs assistance/impaired     Grooming: Bed level;Wash/dry face;Set up Grooming Details (indicate cue type and reason): required verbal cues to initiate washing face supine in bed                   Toilet Transfer Details (indicate cue type and reason): unable to transfer OOB from EOB         Functional mobility during ADLs: Maximal assistance;+2 for safety/equipment General ADL Comments: pt following commands inconsistently; able to sit EOB ~ 6 minutes with MIN-CGA     Vision       Perception     Praxis      Cognition Arousal/Alertness: Awake/alert Behavior During Therapy: Flat affect Overall Cognitive Status: Impaired/Different from baseline Area of Impairment: Orientation;Attention;Safety/judgement;Following commands;Problem solving;Awareness                 Orientation Level: Disoriented to;Place;Time;Situation;Person(ubable to state) Current Attention Level: Sustained   Following Commands: Follows one step commands with increased time;Follows multi-step commands inconsistently Safety/Judgement: Decreased awareness of safety;Decreased awareness of  deficits Awareness: Emergent Problem Solving: Slow processing;Decreased initiation;Difficulty sequencing;Requires verbal cues;Requires tactile cues General Comments: pt flat with falt affect overall, slow to process, intermittently responding to commands by nodding/ shaking head        Exercises     Shoulder Instructions       General Comments husband arrived at end of session    Pertinent Vitals/ Pain       Pain  Assessment: Faces Faces Pain Scale: Hurts little more Pain Location: generalized. Pt grimacing but unable to state source of pain. Pain Descriptors / Indicators: Grimacing Pain Intervention(s): Limited activity within patient's tolerance;Monitored during session;Repositioned  Home Living                                          Prior Functioning/Environment              Frequency  Min 2X/week        Progress Toward Goals  OT Goals(current goals can now be found in the care plan section)  Progress towards OT goals: Progressing toward goals  Acute Rehab OT Goals Patient Stated Goal: not stated OT Goal Formulation: With patient Time For Goal Achievement: 03/24/19 Potential to Achieve Goals: Good  Plan Discharge plan remains appropriate    Co-evaluation                 AM-PAC OT "6 Clicks" Daily Activity     Outcome Measure   Help from another person eating meals?: A Lot Help from another person taking care of personal grooming?: A Lot Help from another person toileting, which includes using toliet, bedpan, or urinal?: Total Help from another person bathing (including washing, rinsing, drying)?: Total Help from another person to put on and taking off regular upper body clothing?: Total Help from another person to put on and taking off regular lower body clothing?: Total 6 Click Score: 8    End of Session    OT Visit Diagnosis: Other abnormalities of gait and mobility (R26.89);Muscle weakness (generalized) (M62.81);Pain;History of falling (Z91.81) Pain - Right/Left: Left Pain - part of body: Hip;Leg   Activity Tolerance Patient tolerated treatment well   Patient Left in bed;with call bell/phone within reach;with bed alarm set;with family/visitor present   Nurse Communication Mobility status        Time: 2919-1660 OT Time Calculation (min): 20 min  Charges: OT General Charges $OT Visit: 1 Visit OT Treatments $Therapeutic  Activity: 8-22 mins [place], [place] [phone] [place] 03/18/2019, 11:44 AM

## 2019-03-18 NOTE — Progress Notes (Signed)
Daily Progress Note   Patient Name: Phyllis Hernandez       Date: 03/18/2019 DOB: 05-15-1951  Age: 68 y.o. MRN#: [id number] Attending Physician: Oda Kilts, MD Primary Care Physician: Patient, No Pcp Per Admit Date: 03/08/2019  Reason for Consultation/Follow-up: Establishing goals of care  Subjective: Patient opens eyes to voice. Follows simple commands and nods head yes/no to questions. No speech.  GOC:   PMT consult received, chart reviewed and discussed with Dr. Sherry Ruffing and RN.   Called son, Legrand Como. No answer and voicemail is not activated.   Briefly met with spouse [name] (per chart review they are separated). Introduced palliative medicine.  Explained that when my colleague, Elmo Putt, met with [name] last week, she had desired their son Legrand Como make medical decisions for her if she was unable to do so. Explained importance of family meeting with Legrand Como for goals of care discussion.   John asks if [name] is going to make it. Explained that the care team has many concerns about her with underlying co-morbidities and now new stroke/poorly progressing with therapy and inability to swallow safely. John continues to state "I want her to live" and he also explains that son, Legrand Como "wants her to live." [name] tells me he will do whatever it takes to help out, including quit his job.   Again explained the importance of family meeting to further discuss goals of care and plan moving forward. John will call Legrand Como and notify PMT phone with available time for meeting.    **This afternoon, received call from RN that son and husband were both at bedside. When this NP arrived to bedside, they had both already left. Call placed to [facility]. He states that Legrand Como had to go to work. Asked John  if the three of [place] could meet tomorrow morning at 10am. He says they will be here at 10am.   Length of Stay: 10  Current Medications: Scheduled Meds:  . amLODipine  10 mg Oral Daily  . aspirin  300 mg Rectal Daily  . Chlorhexidine Gluconate Cloth  6 each Topical Q0600  . Chlorhexidine Gluconate Cloth  6 each Topical Q0600  . [START ON 03/19/2019] darbepoetin (ARANESP) injection - DIALYSIS  100 mcg Intravenous Q Tue-HD  . ezetimibe  10 mg Oral Daily  . feeding supplement (NEPRO CARB STEADY)  237 mL Oral BID BM  . heparin  5,000 Units Subcutaneous Q8H  . hydrocerin   Topical BID  . insulin aspart  0-5 Units Subcutaneous QHS  . insulin aspart  0-9 Units Subcutaneous TID WC  . mouth rinse  15 mL Mouth Rinse BID  . metoprolol tartrate  5 mg Intravenous Q6H  . multivitamin  1 tablet Oral QHS  . polyethylene glycol  17 g Oral BID  . senna  1 tablet Oral BID  . sevelamer carbonate  2,400 mg Oral TID WC  . sodium chloride flush  3 mL Intravenous Q12H    Continuous Infusions:   PRN Meds: acetaminophen **OR** acetaminophen, cyclobenzaprine, oxyCODONE-acetaminophen  Physical Exam Vitals signs and nursing note reviewed.  Constitutional:      Appearance: She is ill-appearing.  HENT:     Head: Normocephalic and atraumatic.  Pulmonary:     Effort: No tachypnea, accessory muscle usage or respiratory distress.  Skin:    General: Skin is warm and dry.  Neurological:     Mental Status: She is easily aroused.     Comments: Opens eyes to voice. Follows many simple commands. Nods head yes/no to simple questions asked. No speech.             Vital Signs: BP 135/63 (BP Location: Right Arm)   Pulse 74   Temp 98.6 F (37 C) (Oral)   Resp 20   Ht '5\' 4"'  (1.626 m)   Wt 93 kg   SpO2 98%   BMI 35.19 kg/m  SpO2: SpO2: 98 % O2 Device: O2 Device: Room Air O2 Flow Rate:    Intake/output summary: No intake or output data in the 24 hours ending 03/18/19 1250 LBM: Last BM Date: 03/08/19  Baseline Weight: Weight: 101.3 kg Most recent weight: Weight: 93 kg       Palliative Assessment/Data: PPS 30%      Patient Active Problem List   Diagnosis Date Noted  . Goals of care, counseling/discussion   . Advance care planning   . Palliative care encounter   . Hypertensive urgency 03/08/2019  . Volume overload 03/08/2019  . Closed displaced fracture of lateral malleolus of right fibula 02/09/2017  . ESRD (end stage renal disease) ([place]) 12/27/2016  . Elevated troponin 12/27/2016  . Anemia of chronic disease 12/27/2016  . Thrombocytopenia ([place]) 12/27/2016  . Constipation 12/27/2016  . Hypertensive emergency 12/09/2016  . Acute on chronic renal failure ([place]) 12/09/2016  . Type 2 diabetes mellitus with complication, without long-term current use of insulin ([place]) 02/27/2007  . Hyperlipidemia 02/27/2007  . Essential hypertension 02/27/2007    Palliative Care Assessment & Plan   Patient Profile: 68 y.o. female  with past medical history of ESRD but noncompliant with HD (last HD 02/22/19), HTN, HLD, diabetes, morbid obesity admitted on 03/08/2019 with fatigue and SOB. Found to have hypertensive urgency and volume overload needing HD. Ongoing encephalopathy this admission. MRI performed and revealed multiple bilateral infarcts involving bilateral cerebral hemispheres. Neurology following.   Assessment: Volume overload ESRD requiring hemodialysis Acute CVA Hypertensive urgency Fever of unknown origin BLE wounds T2 DM  Recommendations/Plan:  Continue FULL code/FULL scope  GOC meeting with patient's husband and son [name] and Legrand Como) tomorrow, 9/29 around 10am.    Code Status: FULL   Code Status Orders  (From admission, onward)         Start     Ordered   03/08/19 0855  Full code  Continuous     03/08/19 0855  Code Status History    Date Active Date Inactive Code Status Order ID Comments User Context   12/09/2016 0454 12/28/2016 1937 Full Code [id number]   Edwin Dada, MD Inpatient   Advance Care Planning Activity       Prognosis:   Unable to determine  Discharge Planning:  To Be Determined  Care plan was discussed with Dr. Sherry Ruffing, [place], husband [name]).   Thank you for allowing the Palliative Medicine Team to assist in the care of this patient.   Time In: 1120 Time Out: 1200 Total Time 40 Prolonged Time Billed no      Greater than 50%  of this time was spent counseling and coordinating care related to the above assessment and plan.  Ihor Dow, FNP-C Palliative Medicine Team  Phone: [phone] Fax: [phone]  Please contact Palliative Medicine Team phone at [phone] for questions and concerns.

## 2019-03-18 NOTE — Progress Notes (Signed)
TCD study  has been completed. Refer to [facility] under chart review to view preliminary results.   03/18/2019  3:23 PM Shelli Portilla, Bonnye Fava

## 2019-03-18 NOTE — Progress Notes (Signed)
Patient ID: Phyllis Hernandez, female   DOB: 1950/07/08, 68 y.o.   MRN: [id number]  [facility] Progress Note   Assessment/ Plan:   1.  Multiple bilateral acute cerebral infarcts: Seen earlier by neurology for additional investigation of etiology including embolic versus cerebrovascular vasospasm.  Blood pressure under satisfactory control. 2. ESRD: Restarted back on hemodialysis with next dialysis due for today.  She had been on dialysis between 01/06/2017 and 03/09/2018 after which she decided to discontinue this therapy until contact during this hospitalization.  Will need multidisciplinary discussions involving palliative care and immediate family to decide on trajectory of management as it appears that she has been refusing dialysis/truncating treatments here as well and this might portend a poor prognosis in the future. 3. Anemia: With significant iron deficiency, will prescribe a series of intravenous iron to complement ongoing ESA. 4. CKD-MBD: Calcium level acceptable with significant hyperphosphatemia, will uptitrate Renvela. 5. Nutrition: Continue renal diet with nutritional supplementation, on renal vitamin. 6. Hypertension: Blood pressures under fair control at this time, continue efforts at volume unloading.  Subjective:   Nods "no" when asked if she has any complaints or problems overnight.   Objective:   BP (!) 100/39 (BP Location: Right Wrist)   Pulse 76   Temp 98.6 F (37 C) (Oral)   Resp 18   Ht 5\' 4"  (1.626 m)   Wt 93 kg   SpO2 100%   BMI 35.19 kg/m   Physical Exam: Gen: Appears to be comfortable sitting in bed, no verbal responses to questions CVS: Pulse regular rhythm, normal rate, S1 and S2 normal Resp: Diminished breath sounds over bases, no rales or rhonchi.  Right IJ TDC Abd: Soft, obese, nontender Ext: Bilateral legs in Ace wrapping, chronic lymphedema/venous stasis changes  Labs: BMET Recent Labs  Lab 03/11/19 1702 03/12/19 0813  03/14/19 0737 03/15/19 0426 03/16/19 0641  NA 135 136 137 135 135  K 4.4 4.4 4.2 4.1 4.2  CL 101 102 100 98 99  CO2 20* 20* 22 25 22   GLUCOSE 135* 100* 115* 128* 118*  BUN 45* 47* 39* 22 30*  CREATININE 8.05* 8.67* 7.83* 5.10* 6.70*  CALCIUM 8.5* 8.0* 7.8* 7.5* 7.7*  PHOS 6.8* 7.4*  --  5.0* 6.0*   CBC Recent Labs  Lab 03/13/19 0730 03/14/19 0737 03/15/19 0426 03/16/19 0641  WBC 13.9* 13.9* 12.2* 11.3*  NEUTROABS 12.0*  --   --   --   HGB 8.7* 7.7* 7.5* 7.4*  HCT 27.7* 25.0* 24.6* 24.5*  MCV 75.3* 74.9* 75.0* 75.2*  PLT 191 189 201 193   Medications:    . amLODipine  10 mg Oral Daily  . aspirin  300 mg Rectal Daily  . Chlorhexidine Gluconate Cloth  6 each Topical Q0600  . Chlorhexidine Gluconate Cloth  6 each Topical Q0600  . [START ON 03/19/2019] darbepoetin (ARANESP) injection - DIALYSIS  100 mcg Intravenous Q Tue-HD  . ezetimibe  10 mg Oral Daily  . feeding supplement (NEPRO CARB STEADY)  237 mL Oral BID BM  . heparin  5,000 Units Subcutaneous Q8H  . hydrocerin   Topical BID  . insulin aspart  0-5 Units Subcutaneous QHS  . insulin aspart  0-9 Units Subcutaneous TID WC  . mouth rinse  15 mL Mouth Rinse BID  . metoprolol tartrate  5 mg Intravenous Q6H  . multivitamin  1 tablet Oral QHS  . polyethylene glycol  17 g Oral BID  . senna  1 tablet Oral BID  .  sevelamer carbonate  800 mg Oral TID WC  . sodium chloride flush  3 mL Intravenous Q12H   Elmarie Shiley, MD 03/18/2019, 9:47 AM

## 2019-03-18 NOTE — Progress Notes (Signed)
    [facility] has been requested to perform a transesophageal echocardiogram on Phyllis MCCLENNY for stroke.  After careful review of history and examination, the risks and benefits of transesophageal echocardiogram have been explained to the patient and her hysband including risks of esophageal damage, perforation (1:10,000 risk), bleeding, pharyngeal hematoma as well as other potential complications associated with conscious sedation including aspiration, arrhythmia, respiratory failure and death. Alternatives to treatment were discussed, questions were answered. Patient is not verbal. She nods consent but I can't be sure that she actually understands. Her husband Orlena Othman is present and is willing to proceed.   The TEE is scheduled for tomorrow 03/19/2019 at 11:00 with Dr. Oval Linsey.   Daune Perch, NP  03/18/2019 3:53 PM

## 2019-03-18 NOTE — Progress Notes (Signed)
Subjective:  Patient was able to nod yes and no to questions. She reports that she has had issue with bowel movements and going to the bathroom. She also agrees that it is frustrating that she is unable to communicate by verbalization.    Objective:  Vital signs in last 24 hours: Vitals:   03/18/19 0009 03/18/19 0500 03/18/19 0536 03/18/19 0754  BP: 132/66  (!) 141/70 (!) 100/39  Pulse: 80  75 76  Resp: 20  16 18   Temp: 98.3 F (36.8 C)  97.8 F (36.6 C) 98.6 F (37 C)  TempSrc: Oral  Oral Oral  SpO2: 98%  97% 100%  Weight:  93 kg    Height:        Physical Exam Constitutional:      General: She is not in acute distress.    Appearance: Normal appearance. She is not ill-appearing.     Comments: Awake in bed, participates through nodding and shaking her head. No acute distress.   HENT:     Head: Normocephalic and atraumatic.  Cardiovascular:     Rate and Rhythm: Normal rate and regular rhythm.     Heart sounds: No murmur. No friction rub. No gallop.   Pulmonary:     Effort: Pulmonary effort is normal.     Breath sounds: Normal breath sounds. No wheezing, rhonchi or rales.  Abdominal:     General: Abdomen is flat. Bowel sounds are normal.  Neurological:     Mental Status: She is alert.   Vas US Carotid:  Summary: Right Carotid: Velocities in the right ICA are consistent with a 1-39% stenosis.  Left Carotid: Velocities in the left ICA are consistent with a 1-39% stenosis.  Vertebrals:  Bilateral vertebral arteries were not visualized. Subclavians: Bilateral subclavian arteries were not visualized. Normal flow              hemodynamics were seen in bilateral subclavian arteries.  Echo complete bubble study: FINDINGS  Left Ventricle: Left ventricular ejection fraction, by visual estimation, is 55 to 60%. The left ventricle has normal function. No evidence of left ventricular regional wall motion abnormalities. There is no left ventricular hypertrophy. Normal left   ventricular size. Spectral Doppler shows Left ventricular diastolic Doppler parameters are consistent with pseudonormalization pattern of LV diastolic filling. Elevated mean left atrial pressure.  Right Ventricle: The right ventricular size is moderately enlarged. Mildly increased right ventricular wall thickness. Global RV systolic function is has mildly reduced systolic function. The tricuspid regurgitant velocity is 3.84 m/s, and with an  assumed right atrial pressure of 15 mmHg, the estimated right ventricular systolic pressure is severely elevated at 74.0 mmHg.  Left Atrium: Left atrial size was moderately dilated.  Right Atrium: Right atrial size was moderately dilated Prominent Eustachian valve.  Pericardium: There is no evidence of pericardial effusion.  Mitral Valve: The mitral valve is normal in structure. There is mild thickening of the mitral valve leaflet(s). Trace mitral valve regurgitation.  Tricuspid Valve: The tricuspid valve is grossly normal. Tricuspid valve regurgitation moderate-severe by color flow Doppler.  Aortic Valve: The aortic valve is normal in structure. Aortic valve regurgitation was not visualized by color flow Doppler. Aortic valve mean gradient measures 7.0 mmHg. Aortic valve peak gradient measures 10.4 mmHg. Aortic valve area, by VTI measures  1.84 cm.  Pulmonic Valve: The pulmonic valve was grossly normal. Pulmonic valve regurgitation is not visualized by color flow Doppler.  Aorta: The aortic root and ascending aorta are structurally normal,  with no evidence of dilitation.  Venous: The inferior vena cava is dilated in size with less than 50% respiratory variability, suggesting right atrial pressure of 15 mmHg.  IAS/Shunts: No atrial level shunt detected by color flow Doppler. Agitated saline contrast was given intravenously to evaluate for intracardiac shunting. Saline contrast bubble study was negative, with no evidence of any interatrial  shunt.   Assessment/Plan:  Principal Problem:   Volume overload Active Problems:   Type 2 diabetes mellitus with complication, without long-term current use of insulin (HCC)   ESRD (end stage renal disease) (HCC)   Anemia of chronic disease   Hypertensive urgency   Goals of care, counseling/discussion   Advance care planning   Palliative care encounter  Acute CVA: - MRI shows multiple scattered acute ischemic infarcts involving the bilateral cerebral hemispheres. Underlying age related cerebral atrophy with advanced chronic microvascular ischemic disease.   - We appreciate Neurology's assistance with Mrs. Kille's medical care             - Vas US carotid: see objective             - Echocardiogram complete bubble study: no intracardiac shunting with bubble study. For complete detail, please refer to objective section.              - Continue tele monitoring  - MR Angio head WO contrast: Today - SLP eval: Recommended an NPO diet with small tsp of water after oral care and meds crushed into puree. They recommend SNF with 24 hour supervision. We appreciate their assistance.  - Continue ASA suppository 300 mg QD - Palliative will be speaking with family for [facility] today. We appreciate their assistance.   VolumeOverload2/2 to ESRD: - Last dialysis session 03/16/2019 - Nephrology following, appreciate assistance  Fever of Unknown Origin: -Currently on zosyn and vanc, will continue for now   HypertensiveUrgency: - Bps range 110/65-141/70 - Continue Amlodipine 10 mg QD - Continue Lopressor 25 mg BID - Continue Hydralazine PRN Q6H - Continue Metoprolol IV  Bilateral LE wounds:  -Continue Eucerin. - continue changing wrappings as needed. - Continue Dilaudid 1-2 mg IV every dialysis session - Continue Percocet/Roxicet Q4H PRN  T2DM:  - Nonadherent to home diabetes medications.  - CBG ranged around 107-128.  - Continue SSI  FEN: No fluids, replete lytes prn,  renal diet  VTE ppx: Heparin Code Status: FULL    Dispo: Anticipated discharge pending medical course.   Asencion Noble, MD 03/18/2019, 9:04 AM Pager: [phone]

## 2019-03-18 NOTE — Progress Notes (Signed)
  Speech Language Pathology Treatment: Dysphagia  Patient Details Name: Phyllis Hernandez MRN: [id number] DOB: 1950/06/29 Today's Date: 03/18/2019 Time: [id number] SLP Time Calculation (min) (ACUTE ONLY): 13 min  Assessment / Plan / Recommendation Clinical Impression  Pt was initially alert and following simple commands ~50% of the time. She inconsistently responded to yes/no questions. She had oral holding that was most pronounced with ice chips, needing Max cues to swallow, fading to Min cues with thin liquids. Despite better initiation as trials progressed, she still had R buccal pocketing that required oral suction and she did not orally accept any bites of puree. She also started to become drowsier, needing Mod cues to maintain alertness. Recommend continuing with sips of water after oral care when alert. Will f/u for PO readiness versus need for MBS pending improved bolus acceptance.   HPI HPI: Seretha Imgrund is a 68 yo female with a medical history of ESRD non-compliant with HD, HTN, HLD, T2DM and constipation who presented to the ED for evaluation of shortness of breath and fatigue.  Brain MRI on 9/26 identified "Multiple scattered acute ischemic infarcts involving the bilateral cerebral hemispheres." No prior known dysphagia hx.       SLP Plan  Continue with current plan of care       Recommendations  Diet recommendations: NPO Medication Administration: Via alternative means                Oral Care Recommendations: Oral care QID Follow up Recommendations: Skilled Nursing facility;24 hour supervision/assistance SLP Visit Diagnosis: Dysphagia, unspecified (R13.10) Plan: Continue with current plan of care       GO                Venita Sheffield Martese Vanatta 03/18/2019, 9:55 AM  [name], M.A. [place] Acute Environmental education officer [phone] Office [phone]

## 2019-03-18 NOTE — Progress Notes (Addendum)
STROKE TEAM PROGRESS NOTE      INTERVAL HISTORY Her husband is at the bedside.  He states he feels responsible for neglecting his wife who was not looking after herself and taking required medical care and follow-up.  Patient recently had fever of unknown origin.  MRI shows bilateral mostly subcortical scattered infarcts which appear lacunar in appearance.  MRA brain shows no large vessel stenosis or occlusion   OBJECTIVE Vitals:   03/18/19 0500 03/18/19 0536 03/18/19 0754 03/18/19 1226  BP:  (!) 141/70 (!) 100/39 135/63  Pulse:  75 76 74  Resp:  16 18 20   Temp:  97.8 F (36.6 C) 98.6 F (37 C)   TempSrc:  Oral Oral   SpO2:  97% 100% 98%  Weight: 93 kg     Height:        CBC:  Recent Labs  Lab 03/13/19 0730  03/15/19 0426 03/16/19 0641  WBC 13.9*   < > 12.2* 11.3*  NEUTROABS 12.0*  --   --   --   HGB 8.7*   < > 7.5* 7.4*  HCT 27.7*   < > 24.6* 24.5*  MCV 75.3*   < > 75.0* 75.2*  PLT 191   < > 201 193   < > = values in this interval not displayed.    Basic Metabolic Panel:  Recent Labs  Lab 03/15/19 0426 03/16/19 0641  NA 135 135  K 4.1 4.2  CL 98 99  CO2 25 22  GLUCOSE 128* 118*  BUN 22 30*  CREATININE 5.10* 6.70*  CALCIUM 7.5* 7.7*  PHOS 5.0* 6.0*    Lipid Panel:     Component Value Date/Time   CHOL 113 03/16/2019 0624   TRIG 95 03/16/2019 0624   HDL 32 (L) 03/16/2019 0624   CHOLHDL 3.5 03/16/2019 0624   VLDL 19 03/16/2019 0624   LDLCALC 62 03/16/2019 0624   HgbA1c:  Lab Results  Component Value Date   HGBA1C 6.3 (H) 03/16/2019   Urine Drug Screen: No results found for: LABOPIA, COCAINSCRNUR, LABBENZ, AMPHETMU, THCU, LABBARB  Alcohol Level No results found for: [facility]  IMAGING  Mr Brain Wo Contrast 03/16/2019 IMPRESSION:  1. Multiple scattered acute ischemic infarcts involving the bilateral cerebral hemispheres as above. No associated hemorrhage or mass effect.  2. Underlying age-related cerebral atrophy with advanced chronic microvascular  ischemic disease.     Transthoracic Echocardiogram  9/227/20 IMPRESSIONS  1. Left ventricular ejection fraction, by visual estimation, is 55 to 60%. The left ventricle has normal function. Normal left ventricular size. There is no left ventricular hypertrophy.  2. Elevated mean left atrial pressure.  3. Left ventricular diastolic Doppler parameters are consistent with pseudonormalization pattern of LV diastolic filling.  4. Global right ventricle has mildly reduced systolic function.The right ventricular size is moderately enlarged. Mildly increased right ventricular wall thickness.  5. Left atrial size was moderately dilated.  6. Right atrial size was moderately dilated.  7. The mitral valve is normal in structure. Trace mitral valve regurgitation.  8. The tricuspid valve is grossly normal. Tricuspid valve regurgitation moderate-severe.  9. The aortic valve is normal in structure. Aortic valve regurgitation was not visualized by color flow Doppler. 10. The pulmonic valve was grossly normal. Pulmonic valve regurgitation is not visualized by color flow Doppler. 11. Severely elevated pulmonary artery systolic pressure, estimated at 74 mm Hg. 12. The inferior vena cava is dilated in size with <50% respiratory variability, suggesting right atrial pressure of 15 mmHg.  Bilateral Carotid Dopplers  00/00/2020 Pending   ECG - SR rate 87  BPM. Prolonged QT interval (See cardiology reading for complete details)   PHYSICAL EXAM Blood pressure 135/63, pulse 74, temperature 98.6 F (37 C), temperature source Oral, resp. rate 20, height 5\' 4"  (1.626 m), weight 93 kg, SpO2 98 %. Obese middle-aged African-American lady who appears not to be in distress. . Afebrile. Head is nontraumatic. Neck is supple without bruit.    Cardiac exam no murmur or gallop. Lungs are clear to auscultation. Distal pulses are not well felt.  She has bilateral discoloration of skin in her lower extremities as well as edema  and thickening.  Both legs are in boots. Neurological Exam : Patient is awake alert but has severe hypophonic speech with pseudobulbar dysarthria and speaks only occasional words and very short sentences.  She is able to follow commands quite well consistently.  She blinks to threat bilaterally.  Extraocular movements are full range without nystagmus.  There is decreased facial expression bilaterally.  Tongue midline.  Motor system exam she can withdraw all 4 extremities against gravity purposefully but has mild generalized weakness in a symmetric fashion.    ASSESSMENT/PLAN [name] is a 68 y.o. female with history of ESRD, HTN, HLD, DM, anemia and depression presenting with AMS. She did not receive IV t-PA due to unknown time of onset.    Stroke: Multiple bilateral infarcts -mostly subcortical and lacunar nature likely from small vessel disease.  Given history of recent fever of unknown origin consideration for bacterial endocarditis     code Stroke CT Head - not ordered  CT head - Mild chronic ischemic white matter disease. No acute intracranial abnormality seen.  MRI head - Multiple scattered acute ischemic infarcts involving the bilateral cerebral hemispheres  MRA head -no large vessel stenosis or occlusion   CTA H&N - not ordered, creatinine 6.7  CT Perfusion - not ordered  Carotid Doppler -bilateral 1-39% carotid stenosis.  Vertebral arteries were not visualized.  2D Echo - EF 55 - 60%. No cardiac source of emboli identified.   [employer] Virus 2  - negative  LDL - 62  HgbA1c - 6.3  UDS - not ordered  VTE prophylaxis - [place] Heparin Diet  Diet Order            Diet NPO time specified  Diet effective midnight        Diet NPO time specified Except for: Other (See Comments)  Diet effective now              No antithrombotic prior to admission, now on aspirin 81 mg daily  Patient counseled to be compliant with her antithrombotic medications  Ongoing  aggressive stroke risk factor management  Therapy recommendations:  pending  Disposition:  Pending  Hypertension  Home BP meds: none  Current BP meds: norvasc  Stable . Permissive hypertension (OK if < 220/120) but gradually normalize in 5-7 days . Long-term BP goal normotensive  Hyperlipidemia  Home Lipid lowering medication: none  LDL 62,  goal < 70  Current lipid lowering medication: Zetia 10 mg daily  Continue statin at discharge  Diabetes  Home diabetic meds: Insulin  Current diabetic meds:  HgbA1c 6.3, goal < 7.0 Recent Labs    03/17/19 2120 03/18/19 0606 03/18/19 1222  GLUCAP 75 98 117*    Other Stroke Risk Factors  Advanced age  Obesity, Body mass index is 35.19 kg/m., recommend weight loss, diet and exercise as  appropriate   Other Active Problems  ESRD  Anemia  Mild Leukocytosis - (temp - 99.1)   Hospital day # 10  Bilateral multiple mostly subcortical lacunar infarcts possibly small vessel disease though need to consider endocarditis given recent history of fever of unknown origin in her poor general medical condition.  Recommend TEE for endocarditis.  Discussed with patient and husband and answered questions.  Personally examined patient and images, and have participated in and made any corrections needed to history, physical, neuro exam,assessment and plan as stated above.  I have personally obtained the history, evaluated lab date, reviewed imaging studies and agree with radiology interpretations.    A total of 25 minutes was spent for the care of this patient, spent on counseling patient and family on different diagnostic and therapeutic options, counseling and coordination of care, riskd ans benefits of management, compliance, or risk factor reduction and education. Antony Contras, MD  To contact Stroke Continuity provider, please refer to [email or web address] After hours, contact General Neurology

## 2019-03-18 NOTE — Progress Notes (Addendum)
  Date: 03/18/2019  Patient name: Phyllis Hernandez  Medical record number: [id number]  Date of birth: Oct 30, 1950   I have seen and evaluated this patient and I have discussed the plan of care with the house staff. Please see their note for complete details. I concur with their findings with the following additions/corrections:   Communication worse today, only nods in response to questions. Stroke workup unrevealing so far, per Dr. Leonie Man need to consider septic emboli with multifocal stroke and recent fever with unclear source. Recommending TEE.   Goals of care is the key at this point. SLP still recommending NPO, mental status has stabilized with apparent language comprehension but no speech. We have been able to discuss with husband, who seems quite overwhelmed, but not able to reach son. Hopefully can arrange family meeting involving son and pastor (per husband's request, patient nodded agreement with this plan today). Will need to discuss proceeding with TEE and continuing dialysis.   When I asked her today about continuing dialysis she seemed hesitant and didn't nod or shake no in response. It seems she is struggling with these decisions.  [name], M.D., Ph.D. 03/18/2019, 4:07 PM   ADDENDUM: Discussed with Dr. Leonie Man, agree that if she had endocarditis, would want to consider treating this as a possibly reversible cause of some of her deficits. Will proceed with TEE with family consent, then reassess with family meeting.

## 2019-03-19 ENCOUNTER — Inpatient Hospital Stay (HOSPITAL_COMMUNITY): Payer: Medicare Other

## 2019-03-19 ENCOUNTER — Encounter (HOSPITAL_COMMUNITY): Payer: Self-pay

## 2019-03-19 ENCOUNTER — Encounter (HOSPITAL_COMMUNITY)
Admission: EM | Disposition: A | Discharge status: Hospice | Payer: Self-pay | Source: Home / Self Care | Attending: Internal Medicine

## 2019-03-19 DIAGNOSIS — R011 Cardiac murmur, unspecified: Secondary | ICD-10-CM

## 2019-03-19 DIAGNOSIS — N186 End stage renal disease: Secondary | ICD-10-CM

## 2019-03-19 DIAGNOSIS — I340 Nonrheumatic mitral (valve) insufficiency: Secondary | ICD-10-CM

## 2019-03-19 DIAGNOSIS — I361 Nonrheumatic tricuspid (valve) insufficiency: Secondary | ICD-10-CM

## 2019-03-19 HISTORY — PX: TEE WITHOUT CARDIOVERSION: SHX5443

## 2019-03-19 LAB — RENAL FUNCTION PANEL
Albumin: 1.7 g/dL — ABNORMAL LOW (ref 3.5–5.0)
Anion gap: 16 — ABNORMAL HIGH (ref 5–15)
BUN: 35 mg/dL — ABNORMAL HIGH (ref 8–23)
CO2: 24 mmol/L (ref 22–32)
Calcium: 8.1 mg/dL — ABNORMAL LOW (ref 8.9–10.3)
Chloride: 99 mmol/L (ref 98–111)
Creatinine, Ser: 7.11 mg/dL — ABNORMAL HIGH (ref 0.44–1.00)
GFR calc Af Amer: 6 mL/min — ABNORMAL LOW (ref >60)
GFR calc non Af Amer: 6 mL/min — ABNORMAL LOW (ref >60)
Glucose, Bld: 129 mg/dL — ABNORMAL HIGH (ref 70–99)
Phosphorus: 7 mg/dL — ABNORMAL HIGH (ref 2.5–4.6)
Potassium: 4.8 mmol/L (ref 3.5–5.1)
Sodium: 139 mmol/L (ref 135–145)

## 2019-03-19 LAB — CBC
HCT: 24 % — ABNORMAL LOW (ref 36.0–46.0)
Hemoglobin: 7.2 g/dL — ABNORMAL LOW (ref 12.0–15.0)
MCH: 22.6 pg — ABNORMAL LOW (ref 26.0–34.0)
MCHC: 30 g/dL (ref 30.0–36.0)
MCV: 75.2 fL — ABNORMAL LOW (ref 80.0–100.0)
Platelets: 269 10*3/uL (ref 150–400)
RBC: 3.19 MIL/uL — ABNORMAL LOW (ref 3.87–5.11)
RDW: 15.9 % — ABNORMAL HIGH (ref 11.5–15.5)
WBC: 13.3 10*3/uL — ABNORMAL HIGH (ref 4.0–10.5)
nRBC: 0.2 % (ref 0.0–0.2)

## 2019-03-19 LAB — GLUCOSE, CAPILLARY
Glucose-Capillary: 89 mg/dL (ref 70–99)
Glucose-Capillary: 78 mg/dL (ref 70–99)
Glucose-Capillary: 97 mg/dL (ref 70–99)
Glucose-Capillary: 74 mg/dL (ref 70–99)

## 2019-03-19 SURGERY — ECHOCARDIOGRAM, TRANSESOPHAGEAL
Anesthesia: Moderate Sedation

## 2019-03-19 MED ORDER — FENTANYL CITRATE (PF) 100 MCG/2ML IJ SOLN
INTRAMUSCULAR | Status: AC
  Filled 2019-03-19: qty 2

## 2019-03-19 MED ORDER — DEXTROSE 50 % IV SOLN
INTRAVENOUS | Status: AC
  Administered 2019-03-19: 13:00:00 via INTRAVENOUS
  Filled 2019-03-19: qty 50

## 2019-03-19 MED ORDER — DIPHENHYDRAMINE HCL 50 MG/ML IJ SOLN
INTRAMUSCULAR | Status: AC
  Filled 2019-03-19: qty 1

## 2019-03-19 MED ORDER — FENTANYL CITRATE (PF) 100 MCG/2ML IJ SOLN
INTRAMUSCULAR | Status: DC | PRN
  Administered 2019-03-19 (×3): 25 ug via INTRAVENOUS

## 2019-03-19 MED ORDER — BUTAMBEN-TETRACAINE-BENZOCAINE 2-2-14 % EX AERO
INHALATION_SPRAY | CUTANEOUS | Status: DC | PRN
  Administered 2019-03-19: 2 via TOPICAL

## 2019-03-19 MED ORDER — HEPARIN SODIUM (PORCINE) 1000 UNIT/ML DIALYSIS
40.0000 [IU]/kg | INTRAMUSCULAR | Status: DC | PRN
  Filled 2019-03-19: qty 4

## 2019-03-19 MED ORDER — SODIUM CHLORIDE 0.9 % IV SOLN
INTRAVENOUS | Status: DC
  Administered 2019-03-19: 11:00:00 via INTRAVENOUS

## 2019-03-19 MED ORDER — MIDAZOLAM HCL (PF) 5 MG/ML IJ SOLN
INTRAMUSCULAR | Status: AC
  Filled 2019-03-19: qty 2

## 2019-03-19 MED ORDER — MIDAZOLAM HCL (PF) 10 MG/2ML IJ SOLN
INTRAMUSCULAR | Status: DC | PRN
  Administered 2019-03-19: 1 mg via INTRAVENOUS
  Administered 2019-03-19: 2 mg via INTRAVENOUS
  Administered 2019-03-19: 1 mg via INTRAVENOUS

## 2019-03-19 MED ORDER — DEXTROSE 50 % IV SOLN
25.0000 g | INTRAVENOUS | Status: AC
  Administered 2019-03-19: 25 g via INTRAVENOUS

## 2019-03-19 NOTE — H&P (Signed)
Phyllis Hernandez is a 68 y.o. female who has presented today for surgery, with the diagnosis of stroke.  The various methods of treatment have been discussed with the patient and family. After consideration of risks, benefits and other options for treatment, the patient has consented to  Procedure(s): TRANSESOPHAGEAL ECHOCARDIOGRAM (TEE) (N/A) as a surgical intervention .  The patient's history has been reviewed, patient examined, no change in status, stable for surgery.  I have reviewed the patient's chart and labs.  Questions were answered to the patient's son's satisfaction.    Jonnathan Birman C. Oval Linsey, MD, [facility]  03/19/2019 11:05 AM

## 2019-03-19 NOTE — Progress Notes (Signed)
PT Cancellation Note  Patient Details Name: Phyllis Hernandez MRN: [id number] DOB: 1951-02-06   Cancelled Treatment:    Reason Eval/Treat Not Completed: (P) Patient's level of consciousness Pt is very lethargic s/p TEE and unable to participate in therapy at this time. PT will follow back for treatment tomorrow.  Inola Lisle B. Migdalia Dk PT, DPT Acute Rehabilitation Services Pager [phone] Office [phone]  [place] 03/19/2019, 2:40 PM

## 2019-03-19 NOTE — Progress Notes (Signed)
Patient ID: Phyllis Hernandez, female   DOB: 11/28/1950, 68 y.o.   MRN: [id number]  [facility] Progress Note   Assessment/ Plan:   1.  Multiple bilateral acute cerebral infarcts: Seen earlier by neurology for additional investigation of etiology including embolic versus cerebrovascular vasospasm-concern raised for possible SBE/thromboembolic events with recent preceding history of fever of unknown origin.  Plans in place for TEE today. 2. ESRD: Restarted back on hemodialysis with next dialysis later today (continue to maintain a TTS schedule while here in the hospital).  She had been on dialysis between 01/06/2017 and 03/09/2018 after which she decided to discontinue this therapy until contact during this hospitalization.  Will continue discussions regarding the importance of adherence with dialysis treatment/time. 3. Anemia: With significant iron deficiency, will prescribe a series of intravenous iron to complement ongoing ESA. 4. CKD-MBD: Calcium level acceptable with significant hyperphosphatemia, will uptitrate Renvela. 5. Nutrition: Continue renal diet with nutritional supplementation, on renal vitamin. 6. Hypertension: Blood pressure intermittently elevated, monitor with hemodialysis/UF  Subjective:   She nods no to indicate that she did not have any problems overnight.  Plans for TEE noted today.   Objective:   BP (!) 174/68 (BP Location: Left Arm)   Pulse 80   Temp 98.3 F (36.8 C) (Axillary)   Resp 20   Ht 5\' 4"  (1.626 m)   Wt 91.3 kg   SpO2 98%   BMI 34.55 kg/m   Physical Exam: Gen: Comfortably resting flat in bed CVS: Pulse regular rhythm, normal rate, S1 and S2 normal Resp: Anteriorly clear to auscultation, no rales or rhonchi.  Right IJ TDC Abd: Soft, obese, nontender Ext: Bilateral legs in Ace wrapping, chronic lymphedema/venous stasis changes  Labs: BMET Recent Labs  Lab 03/14/19 0737 03/15/19 0426 03/16/19 0641  NA 137 135 135  K 4.2 4.1 4.2   CL 100 98 99  CO2 22 25 22   GLUCOSE 115* 128* 118*  BUN 39* 22 30*  CREATININE 7.83* 5.10* 6.70*  CALCIUM 7.8* 7.5* 7.7*  PHOS  --  5.0* 6.0*   CBC Recent Labs  Lab 03/13/19 0730 03/14/19 0737 03/15/19 0426 03/16/19 0641  WBC 13.9* 13.9* 12.2* 11.3*  NEUTROABS 12.0*  --   --   --   HGB 8.7* 7.7* 7.5* 7.4*  HCT 27.7* 25.0* 24.6* 24.5*  MCV 75.3* 74.9* 75.0* 75.2*  PLT 191 189 201 193   Medications:    . amLODipine  10 mg Oral Daily  . aspirin  300 mg Rectal Daily  . Chlorhexidine Gluconate Cloth  6 each Topical Q0600  . Chlorhexidine Gluconate Cloth  6 each Topical Q0600  . darbepoetin (ARANESP) injection - DIALYSIS  100 mcg Intravenous Q Tue-HD  . ezetimibe  10 mg Oral Daily  . feeding supplement (NEPRO CARB STEADY)  237 mL Oral BID BM  . heparin  5,000 Units Subcutaneous Q8H  . hydrocerin   Topical BID  . insulin aspart  0-5 Units Subcutaneous QHS  . insulin aspart  0-9 Units Subcutaneous TID WC  . mouth rinse  15 mL Mouth Rinse BID  . metoprolol tartrate  5 mg Intravenous Q6H  . multivitamin  1 tablet Oral QHS  . polyethylene glycol  17 g Oral BID  . senna  1 tablet Oral BID  . sevelamer carbonate  2,400 mg Oral TID WC  . sodium chloride flush  3 mL Intravenous Q12H   Elmarie Shiley, MD 03/19/2019, 8:57 AM

## 2019-03-19 NOTE — Progress Notes (Signed)
  Date: 03/19/2019  Patient name: Phyllis Hernandez  Medical record number: [id number]  Date of birth: May 25, 1951   I have seen and evaluated this patient and I have discussed the plan of care with the house staff. Please see their note for complete details. I concur with their findings with the following additions/corrections:   Quite sleepy today, but able to nod and shake head in response to questions. TEE today with no evidence of endocarditis. At this point, no clear reversible etiology for her poor swallowing and encephalopathy. Likely related to her multifocal strokes. The likelihood of significant recovery past her current level of function seems quite low. If she struggled with dialysis previously, it is hard to imagine her tolerating it well at this point.  Appreciate palliative care family meeting today, they would still like to proceed with full scope of care (and full code). Will try to discuss more with her husband and son tomorrow.   [name], M.D., Ph.D. 03/19/2019, 5:23 PM

## 2019-03-19 NOTE — Progress Notes (Signed)
   Subjective: Phyllis Hernandez was seed at bedside. Patient was able to signal yes and no, still not verbalizing anything. She denied any pain or issues right now. We discussed the plan to obtain the TEE today. She appeared more tired today. We also discussed that case management will be speaking with her and her family today regarding goals of care.   Objective:  Vital signs in last 24 hours: Vitals:   03/18/19 2012 03/18/19 2347 03/19/19 0405 03/19/19 0406  BP: 140/68 (!) 188/76  (!) 174/68  Pulse: 74 80  80  Resp: 19 19  20   Temp: 98.7 F (37.1 C) 98.5 F (36.9 C)  98.3 F (36.8 C)  TempSrc: Axillary Axillary  Axillary  SpO2: 97% 98%  98%  Weight:   91.3 kg   Height:       Physical Exam Constitutional:      Appearance: She is obese.     Comments: Female laying in bed, nonverbal, answering questions with nods and shakes of her head.   HENT:     Head: Normocephalic and atraumatic.  Cardiovascular:     Rate and Rhythm: Normal rate and regular rhythm.     Pulses: Normal pulses.     Heart sounds: Murmur present. No friction rub. No gallop.   Pulmonary:     Effort: Pulmonary effort is normal.     Breath sounds: Normal breath sounds. No wheezing, rhonchi or rales.  Abdominal:     General: Abdomen is flat. Bowel sounds are normal.  Neurological:     Mental Status: She is alert.    Assessment/Plan:  Principal Problem:   ESRD (end stage renal disease) ([place]) Active Problems:   Type 2 diabetes mellitus with complication, without long-term current use of insulin (HCC)   Anemia of chronic disease   Hypertensive urgency   Volume overload   Goals of care, counseling/discussion   Advance care planning   Palliative care encounter   CVA (cerebral vascular accident) ([place])  Acute CVA: - MRI shows multiple scattered acute ischemic infarcts involving the bilateral cerebral hemispheres. Underlying age related cerebral atrophy with advanced chronic microvascular ischemic disease.   - We  appreciate Neurology's assistance with Phyllis Hernandez's medical care - SLP eval: NPO - Per Dr. Leonie Man, possible new onset stroke with unexplained fevers warrants a workup for endocarditis. TEE procedure today.  - Palliative will be meeting with family today for an appointment around 10:00 a.m. we appreciate their assistance with Phyllis Hernandez's care.   VolumeOverload2/2 to ESRD: - Last dialysis session 03/16/2019 - Nephrology following, appreciate assistance   Hypertension: - Continue Amlodipine 10 mg QD - Continue Lopressor 25 mg BID - Continue Hydralazine PRN Q6H - Continue Metoprolol IV  Bilateral LE wounds:  -Continue Eucerin. - continue changing wrappings as needed. - Continue Dilaudid 1-2 mg IV every dialysis session - Continue Percocet/Roxicet Q4H PRN  T2DM:  - Nonadherent to home diabetes medications.  - CBG: 89.  - Continue SSI  Fever with Unknown Source:Resolved FEN: No fluids, replete lytes prn, renal diet  VTE ppx: Heparin Code Status: FULL    Dispo: Anticipated discharge pending medical course.   Maudie Mercury, MD 03/19/2019, 6:14 AM Pager: [phone]

## 2019-03-19 NOTE — CV Procedure (Signed)
Brief TEE Note  LVEF 55-60% Trivial MR Moderate to severe TR At least moderately elevated pulmonary pressures No LA/LAA thrombus or masses No ASD or PFO by color flow Doppler or saline microcavitation study Small mobile density noted on the tip of the RA catheter.  Could represent thrombin or thrombus  For additional details see full report.  During this procedure the patient is administered a total of Versed 5 mg and Fentanyl 75 mcg to achieve and maintain moderate conscious sedation.  The patient's heart rate, blood pressure, and oxygen saturation are monitored continuously during the procedure. The period of conscious sedation is 13 minutes, of which I was present face-to-face 100% of this time.  Joell Buerger C. Oval Linsey, MD, [facility] 03/19/2019 11:43 AM

## 2019-03-19 NOTE — Progress Notes (Addendum)
STROKE TEAM PROGRESS NOTE      INTERVAL HISTORY Her husband is at the bedside.  Patient has returned from   TEE and is sedated  OBJECTIVE Vitals:   03/19/19 1130 03/19/19 1135 03/19/19 1149 03/19/19 1200  BP: (!) 125/46 (!) 146/43 120/63 (!) 129/40  Pulse: 80 82  78  Resp: 18 (!) 21 19 19   Temp:   (!) 97.3 F (36.3 C)   TempSrc:   Tympanic   SpO2: 99%   93%  Weight:      Height:        CBC:  Recent Labs  Lab 03/13/19 0730  03/16/19 0641 03/19/19 1306  WBC 13.9*   < > 11.3* 13.3*  NEUTROABS 12.0*  --   --   --   HGB 8.7*   < > 7.4* 7.2*  HCT 27.7*   < > 24.5* 24.0*  MCV 75.3*   < > 75.2* 75.2*  PLT 191   < > 193 269   < > = values in this interval not displayed.    Basic Metabolic Panel:  Recent Labs  Lab 03/16/19 0641 03/19/19 1306  NA 135 139  K 4.2 4.8  CL 99 99  CO2 22 24  GLUCOSE 118* 129*  BUN 30* 35*  CREATININE 6.70* 7.11*  CALCIUM 7.7* 8.1*  PHOS 6.0* 7.0*    Lipid Panel:     Component Value Date/Time   CHOL 113 03/16/2019 0624   TRIG 95 03/16/2019 0624   HDL 32 (L) 03/16/2019 0624   CHOLHDL 3.5 03/16/2019 0624   VLDL 19 03/16/2019 0624   LDLCALC 62 03/16/2019 0624   HgbA1c:  Lab Results  Component Value Date   HGBA1C 6.3 (H) 03/16/2019   Urine Drug Screen: No results found for: LABOPIA, COCAINSCRNUR, LABBENZ, AMPHETMU, THCU, LABBARB  Alcohol Level No results found for: [facility]  IMAGING  Mr Brain Wo Contrast 03/16/2019 IMPRESSION:  1. Multiple scattered acute ischemic infarcts involving the bilateral cerebral hemispheres as above. No associated hemorrhage or mass effect.  2. Underlying age-related cerebral atrophy with advanced chronic microvascular ischemic disease.     Transthoracic Echocardiogram  9/227/20 IMPRESSIONS  1. Left ventricular ejection fraction, by visual estimation, is 55 to 60%. The left ventricle has normal function. Normal left ventricular size. There is no left ventricular hypertrophy.  2. Elevated mean left  atrial pressure.  3. Left ventricular diastolic Doppler parameters are consistent with pseudonormalization pattern of LV diastolic filling.  4. Global right ventricle has mildly reduced systolic function.The right ventricular size is moderately enlarged. Mildly increased right ventricular wall thickness.  5. Left atrial size was moderately dilated.  6. Right atrial size was moderately dilated.  7. The mitral valve is normal in structure. Trace mitral valve regurgitation.  8. The tricuspid valve is grossly normal. Tricuspid valve regurgitation moderate-severe.  9. The aortic valve is normal in structure. Aortic valve regurgitation was not visualized by color flow Doppler. 10. The pulmonic valve was grossly normal. Pulmonic valve regurgitation is not visualized by color flow Doppler. 11. Severely elevated pulmonary artery systolic pressure, estimated at 74 mm Hg. 12. The inferior vena cava is dilated in size with <50% respiratory variability, suggesting right atrial pressure of 15 mmHg.   Bilateral Carotid Dopplers  Bilateral 1-39% carotid stenosis   ECG - SR rate 87  BPM. Prolonged QT interval (See cardiology reading for complete details) TEE no definite cardiac source of embolism.  No PFO no vegetation  PHYSICAL EXAM Blood pressure Marland Kitchen)  129/40, pulse 78, temperature (!) 97.3 F (36.3 C), temperature source Tympanic, resp. rate 19, height 5\' 4"  (1.626 m), weight 91.3 kg, SpO2 93 %. Obese middle-aged African-American lady who appears not to be in distress. . Afebrile. Head is nontraumatic. Neck is supple without bruit.    Cardiac exam no murmur or gallop. Lungs are clear to auscultation. Distal pulses are not well felt.  She has bilateral discoloration of skin in her lower extremities as well as edema and thickening.  Both legs are in boots. Neurological Exam : Patient is is sedated following TEE procedure and not opening eyes or speaking.  She is able to follow commands quite well  consistently.  She blinks to threat bilaterally.  Extraocular movements are full range without nystagmus.  There is decreased facial expression bilaterally.  Tongue midline.  Motor system exam she can withdraw all 4 extremities against gravity purposefully but has mild generalized weakness in a symmetric fashion.    ASSESSMENT/PLAN Phyllis Hernandez is a 68 y.o. female with history of ESRD, HTN, HLD, DM, anemia and depression presenting with AMS. She did not receive IV t-PA due to unknown time of onset.    Stroke: Multiple bilateral infarcts -mostly subcortical and lacunar nature likely from small vessel disease.  Given history of recent fever of unknown origin consideration for bacterial endocarditis     code Stroke CT Head - not ordered  CT head - Mild chronic ischemic white matter disease. No acute intracranial abnormality seen.  MRI head - Multiple scattered acute ischemic infarcts involving the bilateral cerebral hemispheres  MRA head -no large vessel stenosis or occlusion   CTA H&N - not ordered, creatinine 6.7  CT Perfusion - not ordered  Carotid Doppler -bilateral 1-39% carotid stenosis.  Vertebral arteries were not visualized.  2D Echo - EF 55 - 60%. No cardiac source of emboli identified.   [employer] Virus 2  - negative  LDL - 62  HgbA1c - 6.3  UDS - not ordered  VTE prophylaxis - [place] Heparin Diet  Diet Order    None      No antithrombotic prior to admission, now on aspirin 81 mg daily  Patient counseled to be compliant with her antithrombotic medications  Ongoing aggressive stroke risk factor management  Therapy recommendations:  pending  Disposition:  Pending  Hypertension  Home BP meds: none  Current BP meds: norvasc  Stable . Permissive hypertension (OK if < 220/120) but gradually normalize in 5-7 days . Long-term BP goal normotensive  Hyperlipidemia  Home Lipid lowering medication: none  LDL 62,  goal < 70  Current lipid lowering  medication: Zetia 10 mg daily  Continue statin at discharge  Diabetes  Home diabetic meds: Insulin  Current diabetic meds:  HgbA1c 6.3, goal < 7.0 Recent Labs    03/18/19 2112 03/19/19 0604 03/19/19 1250  GLUCAP 86 89 97    Other Stroke Risk Factors  Advanced age  Obesity, Body mass index is 34.55 kg/m., recommend weight loss, diet and exercise as appropriate   Other Active Problems  ESRD  Anemia  Mild Leukocytosis - (temp - 99.1)   Hospital day # 11  Bilateral multiple mostly subcortical lacunar infarcts possibly small vessel disease.  Infectious endocarditis would be unlikely in the absence of vegetations and negative blood cultures..  Discussed with   husband and answered questions.  Consider palliative care approach and hospice given her poor general medical condition and prognosis.  Discussed with interval medical residency team  intern on call    Stroke team will sign off.  Kindly call for questions.  Antony Contras, MD  To contact Stroke Continuity provider, please refer to [email or web address] After hours, contact General Neurology

## 2019-03-19 NOTE — Progress Notes (Signed)
SLP Cancellation Note  Patient Details Name: Phyllis Hernandez MRN: [id number] DOB: July 11, 1950   Cancelled treatment:       Reason Eval/Treat Not Completed: Medical issues which prohibited therapy - pt is NPO pending TEE today. Also note that palliative care meeting is scheduled with family. SLP will f/u as able.   Venita Sheffield [name] 03/19/2019, 8:12 AM  [name], M.A. [place] Acute Environmental education officer [phone] Office [phone]

## 2019-03-19 NOTE — Progress Notes (Signed)
PT Cancellation Note  Patient Details Name: Phyllis Hernandez MRN: [id number] DOB: 1951/02/25   Cancelled Treatment:    Reason Eval/Treat Not Completed: (P) Patient at procedure or test/unavailable Pt off floor for endoscopy. PT will follow back this afternoon as able.   Ingeborg Fite B. Migdalia Dk PT, DPT Acute Rehabilitation Services Pager [phone] Office [phone]   [name] 03/19/2019, 11:28 AM

## 2019-03-19 NOTE — Progress Notes (Addendum)
  Echocardiogram Echocardiogram Transesophageal has been performed.  Phyllis Hernandez 03/19/2019, 12:11 PM

## 2019-03-19 NOTE — Procedures (Signed)
Patient seen on Hemodialysis. BP (!) 176/58   Pulse 76   Temp (!) 97.3 F (36.3 C) (Tympanic)   Resp 18   Ht 5\' 4"  (1.626 m)   Wt 89.5 kg   SpO2 93%   BMI 33.87 kg/m   QB 400, UF goal 3L Tolerating treatment without complaints at this time.   Elmarie Shiley MD [facility]. Office # [phone] Pager # [phone] 4:45 PM

## 2019-03-19 NOTE — Progress Notes (Signed)
Daily Progress Note   Patient Name: Phyllis Hernandez       Date: 03/19/2019 DOB: [birth year]  Age: 67 y.o. MRN#: [id number] Attending Physician: Oda Kilts, MD Primary Care Physician: Patient, No Pcp Per Admit Date: 03/08/2019  Reason for Consultation/Follow-up: Establishing goals of care  Subjective: Patient resting this AM during first visit. During family meeting, patient was off the unit for TEE.   GOC:  Palliative f/u with patient's spouse Phyllis Hernandez) and son Phyllis Hernandez) at bedside.   Introduced Palliative Medicine as specialized medical care for people living with serious illness. It focuses on providing relief from the symptoms and stress of a serious illness. The goal is to improve quality of life for both the patient and the family.  We discussed a brief life review of the patient. Prior to admission, patient living with son Phyllis Hernandez) but independent. Son confirms that she has not gone to dialysis since September 2019. Phyllis Hernandez believes she stopped dialysis because it caused her pain and fatigue. She surprisingly remained fairly active and independent off of dialysis until a few months ago. Phyllis Hernandez and Phyllis Hernandez have urged her to continue dialysis but she would not.   Discussed course of hospitalization in detail including diagnoses, interventions, plan of care and guarded prognosis. Discussed plan for TEE today--evaluation for infection.   Phyllis Hernandez has not documented her wishes regarding life-prolonging interventions. Phyllis Hernandez and Phyllis Hernandez speak of how strong-willed she is and their belief that she would want to continue fighting. Phyllis Hernandez is hopeful that if we give her more time, she will be able to participate in conversation and tell us her thoughts on continuing dialysis. Explained that although she is  intermittently awake and communicative with them and staff, she may not completely understand complexity of medical conditions.   Reviewed SLP recommendations and concern with swallow and nutrition.   Explored their thought regarding resuscitation/ventilator if she took a turn for the worst. Phyllis Hernandez and Phyllis Hernandez are tearful. Phyllis Hernandez shares that is a hard question to answer and they cannot answer that question today. Emotional support provided. Phyllis Hernandez does acknowledge he does not want her to be in pain. Phyllis Hernandez shares that two previous family member were on ventilators and made the decision to 'pull the plug' and they did not wish to live with poor quality of life.   Discussed quality  of life vs. Quantity including if she does express to [place] a desire to NOT continue dialysis. Again, Phyllis Hernandez is hopeful she will show progression and be able to communicate with care team.   Phyllis Hernandez and Phyllis Hernandez "don't want to pull the plug." They wish to continue FULL code/FULL scope treatment. They are willing to pursue feeding tube placement if necessary. They would like to continue dialysis attempts and treat for infection if found on TEE.   Phyllis Hernandez speaks of his desire to do anything possible to help Phyllis Hernandez live including quit his job to take care of her.   Questions and concerns were addressed.  Hard Choices booklet left for review. Therapeutic listening and emotional support provided. PMT contact information given.    Length of Stay: 11  Current Medications: Scheduled Meds:  . [MAR Hold] amLODipine  10 mg Oral Daily  . [MAR Hold] aspirin  300 mg Rectal Daily  . [MAR Hold] Chlorhexidine Gluconate Cloth  6 each Topical Q0600  . [MAR Hold] Chlorhexidine Gluconate Cloth  6 each Topical Q0600  . [MAR Hold] darbepoetin (ARANESP) injection - DIALYSIS  100 mcg Intravenous Q Tue-HD  . [MAR Hold] ezetimibe  10 mg Oral Daily  . [MAR Hold] feeding supplement (NEPRO CARB STEADY)  237 mL Oral BID BM  . [MAR Hold] heparin  5,000 Units  Subcutaneous Q8H  . [MAR Hold] hydrocerin   Topical BID  . [MAR Hold] insulin aspart  0-5 Units Subcutaneous QHS  . [MAR Hold] insulin aspart  0-9 Units Subcutaneous TID WC  . [MAR Hold] mouth rinse  15 mL Mouth Rinse BID  . [MAR Hold] metoprolol tartrate  5 mg Intravenous Q6H  . [MAR Hold] multivitamin  1 tablet Oral QHS  . [MAR Hold] polyethylene glycol  17 g Oral BID  . [MAR Hold] senna  1 tablet Oral BID  . [MAR Hold] sevelamer carbonate  2,400 mg Oral TID WC  . [MAR Hold] sodium chloride flush  3 mL Intravenous Q12H    Continuous Infusions: . sodium chloride 20 mL/hr at 03/19/19 1054    PRN Meds: [MAR Hold] acetaminophen **OR** [MAR Hold] acetaminophen, butamben-tetracaine-benzocaine, [MAR Hold] cyclobenzaprine, fentaNYL, midazolam PF, [MAR Hold] oxyCODONE-acetaminophen  Physical Exam Vitals signs and nursing note reviewed.  Constitutional:      Appearance: She is ill-appearing.  HENT:     Head: Normocephalic and atraumatic.  Pulmonary:     Effort: No tachypnea, accessory muscle usage or respiratory distress.  Skin:    General: Skin is warm and dry.  Neurological:     Mental Status: She is easily aroused.     Comments: Opens eyes to voice. Intermittently following simple commands. Nods head yes/no to simple questions asked. No speech.             Vital Signs: BP (!) 146/43   Pulse 82   Temp 98.6 F (37 C) (Temporal)   Resp (!) 21   Ht 5\' 4"  (1.626 m)   Wt 91.3 kg   SpO2 99%   BMI 34.55 kg/m  SpO2: SpO2: 99 % O2 Device: O2 Device: Nasal Cannula O2 Flow Rate: O2 Flow Rate (L/min): 4 L/min  Intake/output summary:   Intake/Output Summary (Last 24 hours) at 03/19/2019 1138 Last data filed at 03/19/2019 0700 Gross per 24 hour  Intake 0 ml  Output -  Net 0 ml   LBM: Last BM Date: 03/08/19 Baseline Weight: Weight: 101.3 kg Most recent weight: Weight: 91.3 kg  Palliative Assessment/Data: PPS 30%      Patient Active Problem List   Diagnosis Date  Noted  . CVA (cerebral vascular accident) ([place]) 03/18/2019  . Goals of care, counseling/discussion   . Advance care planning   . Palliative care encounter   . Hypertensive urgency 03/08/2019  . Volume overload 03/08/2019  . Closed displaced fracture of lateral malleolus of right fibula 02/09/2017  . ESRD (end stage renal disease) ([place]) 12/27/2016  . Elevated troponin 12/27/2016  . Anemia of chronic disease 12/27/2016  . Thrombocytopenia ([place]) 12/27/2016  . Constipation 12/27/2016  . Hypertensive emergency 12/09/2016  . Acute on chronic renal failure ([place]) 12/09/2016  . Type 2 diabetes mellitus with complication, without long-term current use of insulin ([place]) 02/27/2007  . Hyperlipidemia 02/27/2007  . Essential hypertension 02/27/2007    Palliative Care Assessment & Plan   Patient Profile: 68 y.o. female  with past medical history of ESRD but noncompliant with HD (last HD 02/22/19), HTN, HLD, diabetes, morbid obesity admitted on 03/08/2019 with fatigue and SOB. Found to have hypertensive urgency and volume overload needing HD. Ongoing encephalopathy this admission. MRI performed and revealed multiple bilateral infarcts involving bilateral cerebral hemispheres. Neurology following.   Assessment: Volume overload ESRD requiring hemodialysis Acute CVA Hypertensive urgency Fever of unknown origin BLE wounds T2 DM  Recommendations/Plan:  GOC discussion with patient's spouse and son Legrand Como).  Family wishes for continued FULL code/FULL scope treatment. They are hopeful that Ilze will show improvement in cognitive status and ability to communicate her wishes to them. They are not ready to "pull the plug" and are agreeable with all offered and available medical interventions including feeding tube, treatment for infection (based on TEE results), and hemodialysis.   Pending TEE.  PMT will continue to follow and support inpatient.    Code Status: FULL   Code Status Orders   (From admission, onward)         Start     Ordered   03/08/19 0855  Full code  Continuous     03/08/19 0855        Code Status History    Date Active Date Inactive Code Status Order ID Comments User Context   12/09/2016 0454 12/28/2016 1937 Full Code [id number]  Edwin Dada, MD Inpatient   Advance Care Planning Activity       Prognosis:   Unable to determine  Discharge Planning:  To Be Determined  Care plan was discussed with Dr. Sherry Ruffing, Dr Gilford Rile, RN, spouse, son  Thank you for allowing the Palliative Medicine Team to assist in the care of this patient.   Time In: 1045- Time Out: 1130 Total Time 45 Prolonged Time Billed no      Greater than 50%  of this time was spent counseling and coordinating care related to the above assessment and plan.  Ihor Dow, DNP, FNP-C Palliative Medicine Team  Phone: [phone] Fax: [phone]  Please contact Palliative Medicine Team phone at [phone] for questions and concerns.

## 2019-03-20 ENCOUNTER — Inpatient Hospital Stay (HOSPITAL_COMMUNITY): Payer: Medicare Other

## 2019-03-20 DIAGNOSIS — I071 Rheumatic tricuspid insufficiency: Secondary | ICD-10-CM

## 2019-03-20 DIAGNOSIS — K5900 Constipation, unspecified: Secondary | ICD-10-CM

## 2019-03-20 LAB — GLUCOSE, CAPILLARY
Glucose-Capillary: 99 mg/dL (ref 70–99)
Glucose-Capillary: 90 mg/dL (ref 70–99)
Glucose-Capillary: 89 mg/dL (ref 70–99)
Glucose-Capillary: 99 mg/dL (ref 70–99)

## 2019-03-20 MED ORDER — DARBEPOETIN ALFA 100 MCG/0.5ML IJ SOSY
100.0000 ug | PREFILLED_SYRINGE | INTRAMUSCULAR | Status: DC
  Administered 2019-03-21 – 2019-03-28 (×2): 100 ug via INTRAVENOUS
  Filled 2019-03-20 (×2): qty 0.5

## 2019-03-20 MED ORDER — DOXERCALCIFEROL 4 MCG/2ML IV SOLN
2.0000 ug | INTRAVENOUS | Status: DC
  Administered 2019-03-21 – 2019-03-30 (×4): 2 ug via INTRAVENOUS
  Filled 2019-03-20 (×6): qty 2

## 2019-03-20 NOTE — Progress Notes (Signed)
  Date: 03/20/2019  Patient name: Phyllis Hernandez  Medical record number: [id number]  Date of birth: [birth year]   I have seen and evaluated this patient and I have discussed the plan of care with the house staff. Please see their note for complete details. I concur with their findings with the following additions/corrections:   Alert and interactive on exam today, but still unable to speak.  Nods in response to questions.  I tried to get her to write on a piece of paper, but she did not.  MBS with SLP today showed primarily oral dysphagia but no aspiration.  Recommending that we offer water after oral care, medications crushed with pure, and staff assistance with feeding, but highly doubt she will be able to maintain her own hydration and nutrition.  I had an extensive discussion with her son, Legrand Como, by phone today.  I summarized her medical care thus far and reviewed what palliative care had discussed with him yesterday.  He understands that at this point it is unlikely she will improve significantly from her current state, but believes strongly that she deserves more time to try to improve.  He asked that we proceed with a feeding tube to help her maintain nutrition and hydration while we provide her this opportunity to demonstrate continued improvement.  When I had asked her this morning about continuing with dialysis, she hesitated, but then nodded assent.  At this point, we will work to get her a feeding tube and ask PT/OT to evaluate her for assistance with discharge planning.  Given that I do not expect any significant recovery in the coming week, and that an NG tube could interfere with her already compromised swallowing, we will contact IR to discuss PEG tube placement.  I personally spent greater than 35 minutes examining the patient, discussing the plan of care with her, and discussing her care with her son.  [name], M.D., Ph.D. 03/20/2019, 5:06 PM

## 2019-03-20 NOTE — Progress Notes (Signed)
Patient ID: Phyllis Hernandez, female   DOB: [birth year], 68 y.o.   MRN: [id number]  [facility] Progress Note   Assessment/ Plan:   1.  Multiple bilateral acute cerebral infarcts: Seen by neurology for additional investigation of etiology including embolic versus cerebrovascular vasospasm-concern raised for possible SBE/thromboembolic events with her TEE yesterday showing absence of valvular vegetations.  An overall poor prognosis is noted with her oropharyngeal dysphagia and the need for PEG tube/feeding tube anticipated for continued nutritional support.  Awaiting evaluation from PT to see what level of mobility she has and care will need upon discharge to decide on further trajectory of care. 2. ESRD: Restarted back on hemodialysis with next dialysis later today (continue to maintain a TTS schedule while here in the hospital).  She had been on dialysis between 01/06/2017 and 03/09/2018 after which she decided to discontinue this therapy until contact during this hospitalization.  Overall poor prognosis with sequelae of multiple bilateral acute cerebral infarcts-we will need to assess nutrition needs and additional needs based on her ability to ambulate/transfer into recliner at hemodialysis.  If unable to tolerate dialysis in recliner, will likely need LTAC. 3. Anemia: With significant iron deficiency, will prescribe a series of intravenous iron to complement ongoing ESA. 4. CKD-MBD: Calcium level acceptable with significant hyperphosphatemia, will uptitrate Renvela. 5. Nutrition: Currently n.p.o with significant oropharyngeal dysphagia and awaiting reevaluation by SLP. 6. Hypertension: Blood pressure intermittently elevated, monitor with hemodialysis/UF  Subjective:   She indicates (by nodding) that dialysis was uneventful yesterday.  She wants to continue with hemodialysis.   Objective:   BP (!) 145/70 (BP Location: Right Arm)   Pulse 81   Temp 98.4 F (36.9 C) (Oral)   Resp 19    Ht 5\' 4"  (1.626 m)   Wt 89.8 kg   SpO2 98%   BMI 33.98 kg/m   Physical Exam: Gen: Comfortably resting flat in bed CVS: Pulse regular rhythm, normal rate, S1 and S2 normal Resp: Anteriorly clear to auscultation, no rales or rhonchi.  Right IJ TDC Abd: Soft, obese, nontender Ext: Bilateral legs in Ace wrapping/soft booties, chronic lymphedema/venous stasis changes  Labs: BMET Recent Labs  Lab 03/14/19 0737 03/15/19 0426 03/16/19 0641 03/19/19 1306  NA 137 135 135 139  K 4.2 4.1 4.2 4.8  CL 100 98 99 99  CO2 22 25 22 24   GLUCOSE 115* 128* 118* 129*  BUN 39* 22 30* 35*  CREATININE 7.83* 5.10* 6.70* 7.11*  CALCIUM 7.8* 7.5* 7.7* 8.1*  PHOS  --  5.0* 6.0* 7.0*   CBC Recent Labs  Lab 03/14/19 0737 03/15/19 0426 03/16/19 0641 03/19/19 1306  WBC 13.9* 12.2* 11.3* 13.3*  HGB 7.7* 7.5* 7.4* 7.2*  HCT 25.0* 24.6* 24.5* 24.0*  MCV 74.9* 75.0* 75.2* 75.2*  PLT 189 201 193 269   Medications:    . amLODipine  10 mg Oral Daily  . aspirin  300 mg Rectal Daily  . Chlorhexidine Gluconate Cloth  6 each Topical Q0600  . Chlorhexidine Gluconate Cloth  6 each Topical Q0600  . darbepoetin (ARANESP) injection - DIALYSIS  100 mcg Intravenous Q Tue-HD  . ezetimibe  10 mg Oral Daily  . feeding supplement (NEPRO CARB STEADY)  237 mL Oral BID BM  . heparin  5,000 Units Subcutaneous Q8H  . hydrocerin   Topical BID  . insulin aspart  0-5 Units Subcutaneous QHS  . insulin aspart  0-9 Units Subcutaneous TID WC  . mouth rinse  15 mL Mouth  Rinse BID  . metoprolol tartrate  5 mg Intravenous Q6H  . multivitamin  1 tablet Oral QHS  . polyethylene glycol  17 g Oral BID  . senna  1 tablet Oral BID  . sevelamer carbonate  2,400 mg Oral TID WC  . sodium chloride flush  3 mL Intravenous Q12H   Elmarie Shiley, MD 03/20/2019, 9:01 AM

## 2019-03-20 NOTE — Progress Notes (Signed)
Physical Therapy Treatment Patient Details Name: Phyllis Hernandez MRN: [id number] DOB: 09-22-1950 Today's Date: 03/20/2019    History of Present Illness Pt is a 68 y.o. female admitted 03/08/19 with volume overload and hypertensive overload after missing HD sessions (per chart, has missed HD for approximately one year); pt opting to resume HD while admitted, but unable to tolerate complete session due to BLE pain. PMH includes ESRD (on HD), DM2, HTN, depression.    PT Comments    Pt very limited this session, unable to follow any commands with multimodal cueing or multiple stimuli. Pt also very lethargic throughout. Limited to bed level PROM/AAROM to all extremities. Pt would continue to benefit from skilled physical therapy services at this time while admitted and after d/c to address the below listed limitations in order to improve overall safety and independence with functional mobility.    Follow Up Recommendations  SNF;Supervision/Assistance - 24 hour     Equipment Recommendations  Hospital bed    Recommendations for Other Services       Precautions / Restrictions Precautions Precautions: Fall;Other (comment) Precaution Comments: Bilateral feet/lower leg wounds wrapped in ace wrap, watch BP Restrictions Weight Bearing Restrictions: No    Mobility  Bed Mobility               General bed mobility comments: pt unable to participate secondary to lethargy and inability to follow commands  Transfers                    Ambulation/Gait                 Stairs             Wheelchair Mobility    Modified Rankin (Stroke Patients Only)       Balance                                            Cognition Arousal/Alertness: Lethargic Behavior During Therapy: Flat affect Overall Cognitive Status: Difficult to assess Area of Impairment: Attention;Following commands                   Current Attention Level:  Sustained   Following Commands: (did not follow any commands this session)              Exercises General Exercises - Upper Extremity Elbow Flexion: AAROM;PROM;Both;10 reps;Supine Elbow Extension: PROM;AAROM;Both;10 reps;Supine General Exercises - Lower Extremity Ankle Circles/Pumps: PROM;AAROM;Both;10 reps;Supine Heel Slides: AAROM;Both;10 reps;Supine Hip ABduction/ADduction: AAROM;Both;10 reps;Supine Straight Leg Raises: AAROM;Both;10 reps;Supine Other Exercises Other Exercises: PROM bilateral UE forearm supination/pronation x10    General Comments        Pertinent Vitals/Pain Pain Assessment: Faces Faces Pain Scale: No hurt    Home Living                      Prior Function            PT Goals (current goals can now be found in the care plan section) Acute Rehab PT Goals PT Goal Formulation: With patient Time For Goal Achievement: 03/24/19 Potential to Achieve Goals: Fair Progress towards PT goals: Not progressing toward goals - comment(pt not responding to commands and very limited (lethargic))    Frequency    Min 2X/week      PT Plan Current plan remains appropriate    Co-evaluation  AM-PAC PT "6 Clicks" Mobility   Outcome Measure  Help needed turning from your back to your side while in a flat bed without using bedrails?: Total Help needed moving from lying on your back to sitting on the side of a flat bed without using bedrails?: Total Help needed moving to and from a bed to a chair (including a wheelchair)?: Total Help needed standing up from a chair using your arms (e.g., wheelchair or bedside chair)?: Total Help needed to walk in hospital room?: Total Help needed climbing 3-5 steps with a railing? : Total 6 Click Score: 6    End of Session   Activity Tolerance: Patient limited by lethargy;Patient limited by fatigue Patient left: in bed;with call bell/phone within reach;with bed alarm set Nurse Communication:  Mobility status PT Visit Diagnosis: Other abnormalities of gait and mobility (R26.89);Muscle weakness (generalized) (M62.81);History of falling (Z91.81)     Time: [id number] PT Time Calculation (min) (ACUTE ONLY): 15 min  Charges:  $Therapeutic Exercise: 8-22 mins                     Sherie Don, [place], DPT  Acute Rehabilitation Services Pager [phone] Office [place] 03/20/2019, 1:10 PM

## 2019-03-20 NOTE — Progress Notes (Addendum)
Subjective: Phyllis Hernandez was seen at bedside this morning. She is reporting pain in left leg. She can point and nod to questions regarding the pain, but unable to write or  Communicate verbally. We spoke with the nurse at bedside who reported that she has not had a bowel movement for 11 days. She is NPO.  Addendum:  Dr. Rebeca Alert spoke with Phyllis Hernandez's son this afternoon. He states that if his mother were able to speak, it would be in her best interest to continue her current course to see if there is improvement. He is also amendable to SNF placement if that is necessary for his mother.  - PT eval and treat ordered. - OT eval and treat ordered. - PEG tube placement.     Objective:  Vital signs in last 24 hours: Vitals:   03/19/19 1916 03/19/19 1941 03/19/19 2309 03/20/19 0429  BP: (!) 172/68 (!) 174/74 (!) 183/78 (!) 141/94  Pulse: 80 94 91 89  Resp: 17 19 20 19   Temp: 98.3 F (36.8 C) 98.1 F (36.7 C) 99.1 F (37.3 C) 98.5 F (36.9 C)  TempSrc: Axillary Axillary Axillary Axillary  SpO2:  94% 100% 96%  Weight: 86.5 kg   89.8 kg  Height:       Physical Exam Vitals signs reviewed.  Constitutional:      General: She is not in acute distress.    Appearance: Normal appearance. She is not ill-appearing, toxic-appearing or diaphoretic.     Comments: Patient lying comfortably in bed, using her arms in addition to head shakes/nods for communication.   HENT:     Head: Normocephalic and atraumatic.  Cardiovascular:     Rate and Rhythm: Normal rate and regular rhythm.     Pulses: Normal pulses.     Heart sounds: Normal heart sounds. No friction rub. No gallop.   Pulmonary:     Effort: Pulmonary effort is normal.     Breath sounds: Normal breath sounds. No wheezing, rhonchi or rales.  Neurological:     Comments: Patient has understanding, but unable to communicate due to possible motor dysfunction.     TEE:  FINDINGS  Left Ventricle: Left ventricular ejection fraction, by visual  estimation, is 60 to 65%. The left ventricle has normal function. There is no left ventricular hypertrophy. Normal left ventricular size.  Right Ventricle: The right ventricular size is normal. No increase in right ventricular wall thickness. Global RV systolic function is has normal systolic function. The tricuspid regurgitant velocity is 3.81 m/s, and with an assumed right atrial pressure  of 10 mmHg, the estimated right ventricular systolic pressure is moderately elevated at 68.1 mmHg.  Left Atrium: Left atrial size was normal in size.  Right Atrium: Right atrial size was moderately dilated A catheter tip is noted in the right atrium with a mobile density measuring 0.9cm x 0.5cm. This may represent thrombus or thrombin.  Pericardium: There is no evidence of pericardial effusion.  Mitral Valve: The mitral valve is normal in structure. No evidence of mitral valve stenosis by observation. Mild mitral valve regurgitation.  Tricuspid Valve: The tricuspid valve is normal in structure. Tricuspid valve regurgitation moderate-severe by color flow Doppler.  Aortic Valve: The aortic valve is tricuspid. Aortic valve regurgitation is trivial by color flow Doppler. The aortic valve is structurally normal, with no evidence of sclerosis or stenosis.  Pulmonic Valve: The pulmonic valve was normal in structure. Pulmonic valve regurgitation is not visualized by color flow Doppler.  Aorta: The  aortic root, ascending aorta and aortic arch are all structurally normal, with no evidence of dilitation or obstruction.  Venous: The inferior vena cava is normal in size with greater than 50% respiratory variability, suggesting right atrial pressure of 3 mmHg.  Shunts: Agitated saline contrast was given intravenously to evaluate for intracardiac shunting. Saline contrast bubble study was negative, with no evidence of any interatrial shunt. No ventricular septal defect is seen or detected. There is no evidence   of an atrial septal defect. No atrial level shunt detected by color flow Doppler.   TRICUSPID VALVE             Normals TR Peak grad:   58.1 mmHg TR Vmax:        381.00 cm/s 288 cm/s  Assessment/Plan:  Principal Problem:   ESRD (end stage renal disease) (HCC) Active Problems:   Type 2 diabetes mellitus with complication, without long-term current use of insulin (HCC)   Anemia of chronic disease   Hypertensive urgency   Volume overload   Goals of care, counseling/discussion   Advance care planning   Palliative care encounter   CVA (cerebral vascular accident) ([place])  Acute CVA: - MRI shows multiple scattered acute ischemic infarcts involving the bilateral cerebral hemispheres. Underlying age related cerebral atrophy with advanced chronic microvascular ischemic disease.   -TEE shows EF of 60-65%, right atrium shows a mobile, 0.9cm-0.5cm mass that may be thrombus. Also mild tricuspid regurgitation.  - We appreciate Neurology's assistance with Phyllis Hernandez medical care. - SLP eval: NPO  - Palliative working with patient's family for goals of care. We appreciate their assistance.   VolumeOverload2/2 to ESRD: - Last dialysis session 03/16/2019 - Nephrology following, appreciate assistance   Hypertension: - Continue Amlodipine 10 mg QD - Continue Lopressor 25 mg BID - Continue Hydralazine PRN Q6H - Continue Metoprolol IV  Bilateral LE wounds:  -Continue Eucerin. - continue changing wrappings as needed. - Continue Dilaudid 1-2 mg IV every dialysis session. - Continue Percocet/Roxicet Q4H PRN  T2DM:  - Nonadherent to home diabetes medications.  - CBG: 90  - Continue SSI  Constipation:  - Per nurse, patient has not had a bowel movement in 11 days.  - Ordered soap and sud enema - Swallow evaluation   Fever with Unknown Source:Resolved FEN: No fluids, replete lytes prn, renal diet  VTE ppx: Heparin Code Status: FULL    Dispo: Anticipated discharge pending  medical course.   Maudie Mercury, MD 03/20/2019, 6:26 AM Pager: [phone]

## 2019-03-20 NOTE — Progress Notes (Signed)
  Speech Language Pathology Treatment: Dysphagia  Patient Details Name: Phyllis Hernandez MRN: [id number] DOB: 11-04-1950 Today's Date: 03/20/2019 Time: [id number] SLP Time Calculation (min) (ACUTE ONLY): 17 min  Assessment / Plan / Recommendation Clinical Impression  Pt is alert this morning although continues to present with what appears to be a significant oropharyngeal dysphagia. She has trouble with initiation, requiring Max faded to Mod cues for bolus acceptance. This also improved with hand-over-hand assist for self-feeding. Her oral phase is marked by R sided anterior spillage and buccal pocketing as well as oral holding. When she swallows, there are multiple swallows that appear to reflect piecemeal swallowing, although cannot definitively determine this at bedside. Intermittent coughing is noted with thin liquids and at times she appears to stifle a cough. Given that this is the most I have seen her consume, she is the most appropriate for MBS today. She does not respond to me initially when asked if she wants to do it, but when I clarify that it helps in deciding what she may be able to safely eat, she nods her head "yes." Per chart, family is trying to make difficult decisions, and at this point they would want a feeding tube place. Perhaps this testing can aid their decision making. Would remain NPO pending testing, scheduled for later today with radiology.   HPI HPI: Nailea Walling is a 68 yo female with a medical history of ESRD non-compliant with HD, HTN, HLD, T2DM and constipation who presented to the ED for evaluation of shortness of breath and fatigue.  Brain MRI on 9/26 identified "Multiple scattered acute ischemic infarcts involving the bilateral cerebral hemispheres." No prior known dysphagia hx.       SLP Plan  Continue with current plan of care       Recommendations  Diet recommendations: NPO Medication Administration: Crushed with puree                Oral  Care Recommendations: Oral care QID Follow up Recommendations: Skilled Nursing facility;24 hour supervision/assistance SLP Visit Diagnosis: Dysphagia, unspecified (R13.10) Plan: Continue with current plan of care       GO                Venita Sheffield Dalan Cowger 03/20/2019, 9:31 AM  [name], M.A. [place] Acute Environmental education officer [phone] Office [phone]

## 2019-03-20 NOTE — Progress Notes (Signed)
Modified Barium Swallow Progress Note  Patient Details  Name: Phyllis Hernandez MRN: [id number] Date of Birth: 1950/07/08  Today's Date: 03/20/2019  Modified Barium Swallow completed.  Full report located under Chart Review in the Imaging Section.  Brief recommendations include the following:  Clinical Impression  Pt has a primarily oral dysphagia with functional pharyngeal swallow initiated when she could propel the bolus posteriorly. She had no aspiration. Orally she has decreased acceptance, oral holding anteriorly, and diffuse residue with all consistencies. Thin liquids also spill anteriorly from her lips and she has difficulty obtaining liquid via straw. Even when she can get it into her mouth, it often falls back down into the straw. Larger volumes actually help her to initiate posterior movement more quickly, as does self-feeding. SLP provided Max cues across the study with pt able to take in very small amounts of POs. Upon completion, pt was cued to expectorate some of the residue that could not be cleared. Given the above, adequate nutritional intake would likely be very challenging. We could offer her sips of water after oral care when alert, and she could continue to try boluses of puree from the floor stock (snacks) to see if we can facilitate a swifter oral response. Would set up oral suction in her room and continue to discuss [place] with family.   Swallow Evaluation Recommendations       SLP Diet Recommendations: Free water protocol after oral care;Other (Comment)(snacks of puree)   Liquid Administration via: Spoon;Cup;Straw   Medication Administration: Crushed with puree   Supervision: Staff to assist with self feeding;Full supervision/cueing for compensatory strategies   Compensations: Minimize environmental distractions;Slow rate;Small sips/bites;Follow solids with liquid   Postural Changes: Seated upright at 90 degrees   Oral Care Recommendations: Oral care before and  after PO;Oral care QID   Other Recommendations: Have oral suction available    Venita Sheffield Nance Mccombs 03/20/2019,12:46 PM   [name], M.A. [place] Acute Environmental education officer [phone] Office [phone]

## 2019-03-21 ENCOUNTER — Inpatient Hospital Stay (HOSPITAL_COMMUNITY): Payer: Medicare Other

## 2019-03-21 DIAGNOSIS — L97929 Non-pressure chronic ulcer of unspecified part of left lower leg with unspecified severity: Secondary | ICD-10-CM

## 2019-03-21 DIAGNOSIS — E11628 Type 2 diabetes mellitus with other skin complications: Secondary | ICD-10-CM

## 2019-03-21 DIAGNOSIS — I63523 Cerebral infarction due to unspecified occlusion or stenosis of bilateral anterior cerebral arteries: Secondary | ICD-10-CM

## 2019-03-21 DIAGNOSIS — E8770 Fluid overload, unspecified: Secondary | ICD-10-CM

## 2019-03-21 DIAGNOSIS — L97919 Non-pressure chronic ulcer of unspecified part of right lower leg with unspecified severity: Secondary | ICD-10-CM

## 2019-03-21 LAB — BASIC METABOLIC PANEL
Anion gap: 17 — ABNORMAL HIGH (ref 5–15)
BUN: 26 mg/dL — ABNORMAL HIGH (ref 8–23)
CO2: 23 mmol/L (ref 22–32)
Calcium: 8.2 mg/dL — ABNORMAL LOW (ref 8.9–10.3)
Chloride: 96 mmol/L — ABNORMAL LOW (ref 98–111)
Creatinine, Ser: 5.49 mg/dL — ABNORMAL HIGH (ref 0.44–1.00)
GFR calc Af Amer: 9 mL/min — ABNORMAL LOW (ref >60)
GFR calc non Af Amer: 8 mL/min — ABNORMAL LOW (ref >60)
Glucose, Bld: 100 mg/dL — ABNORMAL HIGH (ref 70–99)
Potassium: 3.3 mmol/L — ABNORMAL LOW (ref 3.5–5.1)
Sodium: 136 mmol/L (ref 135–145)

## 2019-03-21 LAB — GLUCOSE, CAPILLARY
Glucose-Capillary: 96 mg/dL (ref 70–99)
Glucose-Capillary: 121 mg/dL — ABNORMAL HIGH (ref 70–99)
Glucose-Capillary: 109 mg/dL — ABNORMAL HIGH (ref 70–99)

## 2019-03-21 LAB — CBC
HCT: 26.5 % — ABNORMAL LOW (ref 36.0–46.0)
Hemoglobin: 7.8 g/dL — ABNORMAL LOW (ref 12.0–15.0)
MCH: 22.3 pg — ABNORMAL LOW (ref 26.0–34.0)
MCHC: 29.4 g/dL — ABNORMAL LOW (ref 30.0–36.0)
MCV: 75.7 fL — ABNORMAL LOW (ref 80.0–100.0)
Platelets: 319 10*3/uL (ref 150–400)
RBC: 3.5 MIL/uL — ABNORMAL LOW (ref 3.87–5.11)
RDW: 15.8 % — ABNORMAL HIGH (ref 11.5–15.5)
WBC: 14.2 10*3/uL — ABNORMAL HIGH (ref 4.0–10.5)
nRBC: 0.1 % (ref 0.0–0.2)

## 2019-03-21 MED ORDER — DOXERCALCIFEROL 4 MCG/2ML IV SOLN
INTRAVENOUS | Status: AC
  Administered 2019-03-21: 2 ug via INTRAVENOUS
  Filled 2019-03-21: qty 2

## 2019-03-21 MED ORDER — DARBEPOETIN ALFA 100 MCG/0.5ML IJ SOSY
PREFILLED_SYRINGE | INTRAMUSCULAR | Status: AC
  Administered 2019-03-21: 100 ug via INTRAVENOUS
  Filled 2019-03-21: qty 0.5

## 2019-03-21 MED ORDER — SODIUM CHLORIDE 0.9 % IV SOLN
125.0000 mg | INTRAVENOUS | Status: DC
  Administered 2019-03-21 – 2019-03-30 (×3): 125 mg via INTRAVENOUS
  Filled 2019-03-21 (×7): qty 10
  Filled 2019-03-21: qty 5
  Filled 2019-03-21 (×2): qty 10

## 2019-03-21 MED ORDER — HEPARIN SODIUM (PORCINE) 1000 UNIT/ML IJ SOLN
INTRAMUSCULAR | Status: AC
  Administered 2019-03-21: 3400 [IU] via ORAL
  Filled 2019-03-21: qty 4

## 2019-03-21 NOTE — Progress Notes (Signed)
Nutrition Follow-up  DOCUMENTATION CODES:   Obesity unspecified  INTERVENTION:   Monitor for BM (day 13 without results)   Once PEG placed:   -Nepro @ 20 ml/hr  -Increase by 10 ml Q8 hours to goal rate of 45 ml/hr (1080 ml) -30 ml Prostat BID  At goal TF provides: 2144 kcals, 117 grams protein, 785 ml free water. Meets 100% of needs.    NUTRITION DIAGNOSIS:   Inadequate oral intake related to lethargy/confusion, poor appetite as evidenced by meal completion < 50%, per patient/family report.  Ongoing  GOAL:   Patient will meet greater than or equal to 90% of their needs  Not meeting- for PEG placement  MONITOR:   PO intake, Labs, Weight trends, Supplement acceptance, Skin  REASON FOR ASSESSMENT:   Consult Assessment of nutrition requirement/status, Poor PO  ASSESSMENT:   68 yo female admitted with HTN urgency, volume overload secondary to missing HD. Pt reports she had not been to HD for "months", Last documented HD 02/21/18. Initiated HD 12/2016 but was noncompliant with HD and was "discharged" from dialysis clinic. PMH includes ESRD/HD, DM, HLD, HTN   9/26- found to have acute CVA, NPO  Pt unable to pass MBS yesterday. Mental status continues to be a barrier. Day 5 NPO. Pt and family agreed to PEG placement. Still hasn't had BM with bowel regimen. Pt nods her head yes when asked if she remembers feeding tube discussion (unsure of how much information is being retained). Husband at bedside updated on plan.   Unsure of EDW Admission weight: 101.3 kg  Current weight: 81.2 kg   I/O: -16,248 ml since admit Last HD today: 2949 ml net UF No UOP documented   Medications: aranesp, hectorol, SS novolog, rena-vit, miralax, senokot, renvela Labs: K 3.3 (L) CBG 89-121 phosphorus 7.0 (H)   NUTRITION - FOCUSED PHYSICAL EXAM:    Most Recent Value  Orbital Region  No depletion  Upper Arm Region  No depletion  Thoracic and Lumbar Region  Unable to assess  Buccal Region   No depletion  Temple Region  Mild depletion  Clavicle Bone Region  Mild depletion  Clavicle and Acromion Bone Region  No depletion  Scapular Bone Region  Unable to assess  Dorsal Hand  No depletion  Patellar Region  Mild depletion  Anterior Thigh Region  Mild depletion  Posterior Calf Region  Mild depletion  Edema (RD Assessment)  Moderate  Hair  Reviewed  Eyes  Reviewed  Mouth  Reviewed  Skin  Reviewed  Nails  Reviewed     Diet Order:   Diet Order    None      EDUCATION NEEDS:   Not appropriate for education at this time  Skin:  Skin Assessment: Skin Integrity Issues: Skin Integrity Issues:: Other (Comment) Other: biletaeral LE ulcerations (seen by [facility] RN) from xerosis, partial thickness skin loss, dry wound bed  Last BM:  9/18- still no results  Height:   Ht Readings from Last 1 Encounters:  03/08/19 5\' 4"  (1.626 m)    Weight:   Wt Readings from Last 1 Encounters:  03/21/19 81.2 kg    Ideal Body Weight:  54.5 kg  BMI:  Body mass index is 30.73 kg/m.  Estimated Nutritional Needs:   Kcal:  2000-2200 kcals  Protein:  100-115 g  Fluid:  1000 mL plus UOP (Pt's UOP has been >500 mL daily)   Mariana Single RD, LDN Clinical Nutrition Pager # [phone]

## 2019-03-21 NOTE — Progress Notes (Signed)
Patient ID: ADLEAN NEWHARD, female   DOB: Nov 10, 1950, 68 y.o.   MRN: [id number]  [facility] Progress Note   Assessment/ Plan:   1.  Multiple bilateral acute cerebral infarcts: Seen by neurology for additional investigation of etiology including embolic versus cerebrovascular vasospasm-concern raised for possible SBE/thromboembolic events with her TEE yesterday showing absence of valvular vegetations.  An overall poor prognosis is noted with her oropharyngeal dysphagia and plans noted for PEG tube placement for nutrition. Will review PT recommendations for DC. Overall poor outlook.  2. ESRD: Restarted back on hemodialysis with next dialysis later today (continue to maintain a TTS schedule while here in the hospital).  She had been on dialysis between 01/06/2017 and 03/09/2018 after which she self-discontinued therapy until contact during this hospitalization.  Overall poor prognosis with sequelae of multiple bilateral acute cerebral infarcts/dysphagia requiring PEG tube. If unable to tolerate HD in a recliner, will need LTAC. 3. Anemia: With significant iron deficiency, started intravenous iron to complement ongoing ESA. 4. CKD-MBD: Calcium level acceptable with significant hyperphosphatemia, will uptitrate Renvela. 5. Nutrition: Currently n.p.o with significant oropharyngeal dysphagia and awaiting reevaluation by SLP. 6. Hypertension: Blood pressure intermittently elevated, monitor with hemodialysis/UF  Subjective:   She nods "no" when asked if she has any problems on HD/last night.   Objective:   BP (!) 120/99   Pulse 91   Temp 99.6 F (37.6 C) (Oral)   Resp 19   Ht 5\' 4"  (1.626 m)   Wt 84.2 kg   SpO2 92%   BMI 31.86 kg/m   Physical Exam: Gen: Comfortably sleeping in dialysis CVS: Pulse regular rhythm, normal rate, S1 and S2 normal Resp: Anteriorly clear to auscultation, no rales or rhonchi.  Right IJ TDC Abd: Soft, obese, nontender Ext: Bilateral legs in Ace  wrapping/soft booties, chronic lymphedema/venous stasis changes  Labs: BMET Recent Labs  Lab 03/15/19 0426 03/16/19 0641 03/19/19 1306 03/21/19 0730  NA 135 135 139 136  K 4.1 4.2 4.8 3.3*  CL 98 99 99 96*  CO2 25 22 24 23   GLUCOSE 128* 118* 129* 100*  BUN 22 30* 35* 26*  CREATININE 5.10* 6.70* 7.11* 5.49*  CALCIUM 7.5* 7.7* 8.1* 8.2*  PHOS 5.0* 6.0* 7.0*  --    CBC Recent Labs  Lab 03/15/19 0426 03/16/19 0641 03/19/19 1306 03/21/19 0730  WBC 12.2* 11.3* 13.3* 14.2*  HGB 7.5* 7.4* 7.2* 7.8*  HCT 24.6* 24.5* 24.0* 26.5*  MCV 75.0* 75.2* 75.2* 75.7*  PLT 201 193 269 319   Medications:    . amLODipine  10 mg Oral Daily  . aspirin  300 mg Rectal Daily  . Chlorhexidine Gluconate Cloth  6 each Topical Q0600  . Chlorhexidine Gluconate Cloth  6 each Topical Q0600  . darbepoetin (ARANESP) injection - DIALYSIS  100 mcg Intravenous Q Thu-HD  . doxercalciferol  2 mcg Intravenous Q T,Th,Sa-HD  . ezetimibe  10 mg Oral Daily  . feeding supplement (NEPRO CARB STEADY)  237 mL Oral BID BM  . heparin  5,000 Units Subcutaneous Q8H  . hydrocerin   Topical BID  . insulin aspart  0-5 Units Subcutaneous QHS  . insulin aspart  0-9 Units Subcutaneous TID WC  . mouth rinse  15 mL Mouth Rinse BID  . metoprolol tartrate  5 mg Intravenous Q6H  . multivitamin  1 tablet Oral QHS  . polyethylene glycol  17 g Oral BID  . senna  1 tablet Oral BID  . sevelamer carbonate  2,400  mg Oral TID WC  . sodium chloride flush  3 mL Intravenous Q12H   Elmarie Shiley, MD 03/21/2019, 8:45 AM

## 2019-03-21 NOTE — Progress Notes (Signed)
  Date: 03/21/2019  Patient name: Phyllis Hernandez  Medical record number: [id number]  Date of birth: 12/23/1950        I have seen and evaluated this patient and I have discussed the plan of care with the house staff. Please see their note for complete details. I concur with their findings with the following additions/corrections: Phyllis Hernandez was seen this morning on team rounds.  Awaiting IR consult for PEG placement.  Her distal plans will be to SNF once her treatment plans are finished.  Bartholomew Crews, MD 03/21/2019, 6:39 PM

## 2019-03-21 NOTE — Progress Notes (Signed)
   03/21/19 0800  OT Visit Information  Last OT Received On 03/21/19  Assistance Needed +2  Reason Eval/Treat Not Completed Patient at procedure or test/ unavailable. HD.  History of Present Illness Pt is a 68 y.o. female admitted 03/08/19 with volume overload and hypertensive overload after missing HD sessions (per chart, has missed HD for approximately one year); pt opting to resume HD while admitted, but unable to tolerate complete session due to BLE pain. PMH includes ESRD (on HD), DM2, HTN, depression.   Plan to reattempt.  Tyrone Schimke, OT Acute Rehabilitation Services Pager: [phone] Office: [phone]

## 2019-03-21 NOTE — Procedures (Signed)
Patient seen on Hemodialysis. BP (!) 120/99   Pulse 91   Temp 99.6 F (37.6 C) (Oral)   Resp 19   Ht 5\' 4"  (1.626 m)   Wt 84.2 kg   SpO2 92%   BMI 31.86 kg/m   QB 400, UF goal 3L Tolerating treatment without complaints at this time.   Elmarie Shiley MD [facility]. Office # [phone] Pager # [phone] 9:00 AM

## 2019-03-21 NOTE — Progress Notes (Signed)
   Subjective: Mrs. Jager was seen in the dialysis unit this morning. She states that she is having leg pain during her dialysis. She states that she has not seen her son or her husband today or yesterday. We discussed possible PEG tube placement today. She was amendable to the plan.     Objective:  Vital signs in last 24 hours: Vitals:   03/21/19 1100 03/21/19 1130 03/21/19 1150 03/21/19 1236  BP: (!) 149/52 (!) 114/95 (!) 147/103 (!) 141/63  Pulse: 92 79 (!) 103   Resp:   16 20  Temp:   (!) 97.5 F (36.4 C) 98.3 F (36.8 C)  TempSrc:   Oral Oral  SpO2:   96% 96%  Weight:   81.2 kg   Height:       Physical Exam Constitutional:      Comments: Laying in bed, tolerating dialysis, answering questions with shakes and nods of her head.   HENT:     Head: Normocephalic and atraumatic.  Cardiovascular:     Rate and Rhythm: Normal rate and regular rhythm.     Pulses: Normal pulses.     Heart sounds: Normal heart sounds. No murmur. No friction rub. No gallop.   Pulmonary:     Effort: Pulmonary effort is normal.     Breath sounds: Normal breath sounds. No wheezing, rhonchi or rales.  Abdominal:     General: Abdomen is flat. Bowel sounds are normal.     Tenderness: There is no abdominal tenderness. There is no guarding.  Neurological:     Mental Status: She is alert.     Assessment/Plan:  Principal Problem:   ESRD (end stage renal disease) ([place]) Active Problems:   Type 2 diabetes mellitus with complication, without long-term current use of insulin (HCC)   Anemia of chronic disease   Hypertensive urgency   Volume overload   Goals of care, counseling/discussion   Advance care planning   Palliative care encounter   CVA (cerebral vascular accident) ([place])  Acute CVA: - MRI shows multiple scattered acute ischemic infarcts involving the bilateral cerebral hemispheres. Underlying age related cerebral atrophy with advanced chronic microvascular ischemic disease.   - We appreciate  Neurology's assistance with Mrs. Lean's medical care. - SLP eval: NPO  - Palliative working with patient's family for goals of care. We appreciate their assistance.  - IR eval and management for PEG tube placement. We appreciate their assistance with Ms. Glaab's health.   VolumeOverload2/2 to ESRD: - Last dialysis session 03/16/2019 - Nephrology following, appreciate assistance.   Hypertension: - Continue Amlodipine 10 mg QD - Continue Lopressor 25 mg BID - Continue Hydralazine PRN Q6H - Continue Metoprolol IV  Bilateral LE wounds:  -Continue Eucerin. - continue changing wrappings as needed. - Continue Dilaudid 1-2 mg IV every dialysis session. - Continue Percocet/Roxicet Q4H PRN.  T2DM:  - Nonadherent to home diabetes medications.  - CBG: 100 - Continue SSI  Constipation:  - Per nurse, patient has not had a bowel movement in 11 days.  - Swallow evaluation.   Fever with Unknown Source:Resolved  VTE ppx: Heparin Code Status: FULL    Dispo: Anticipated discharge pending medical course.   Maudie Mercury, MD 03/21/2019, 12:45 PM Pager: [phone]

## 2019-03-22 LAB — GLUCOSE, CAPILLARY
Glucose-Capillary: 110 mg/dL — ABNORMAL HIGH (ref 70–99)
Glucose-Capillary: 122 mg/dL — ABNORMAL HIGH (ref 70–99)
Glucose-Capillary: 128 mg/dL — ABNORMAL HIGH (ref 70–99)
Glucose-Capillary: 116 mg/dL — ABNORMAL HIGH (ref 70–99)

## 2019-03-22 LAB — CBC
HCT: 29 % — ABNORMAL LOW (ref 36.0–46.0)
Hemoglobin: 9 g/dL — ABNORMAL LOW (ref 12.0–15.0)
MCH: 23.4 pg — ABNORMAL LOW (ref 26.0–34.0)
MCHC: 31 g/dL (ref 30.0–36.0)
MCV: 75.3 fL — ABNORMAL LOW (ref 80.0–100.0)
Platelets: 317 10*3/uL (ref 150–400)
RBC: 3.85 MIL/uL — ABNORMAL LOW (ref 3.87–5.11)
RDW: 15.9 % — ABNORMAL HIGH (ref 11.5–15.5)
WBC: 15 10*3/uL — ABNORMAL HIGH (ref 4.0–10.5)
nRBC: 0.3 % — ABNORMAL HIGH (ref 0.0–0.2)

## 2019-03-22 LAB — BASIC METABOLIC PANEL
Anion gap: 17 — ABNORMAL HIGH (ref 5–15)
BUN: 20 mg/dL (ref 8–23)
CO2: 21 mmol/L — ABNORMAL LOW (ref 22–32)
Calcium: 8.4 mg/dL — ABNORMAL LOW (ref 8.9–10.3)
Chloride: 101 mmol/L (ref 98–111)
Creatinine, Ser: 4.07 mg/dL — ABNORMAL HIGH (ref 0.44–1.00)
GFR calc Af Amer: 13 mL/min — ABNORMAL LOW (ref >60)
GFR calc non Af Amer: 11 mL/min — ABNORMAL LOW (ref >60)
Glucose, Bld: 110 mg/dL — ABNORMAL HIGH (ref 70–99)
Potassium: 3.9 mmol/L (ref 3.5–5.1)
Sodium: 139 mmol/L (ref 135–145)

## 2019-03-22 NOTE — Progress Notes (Addendum)
Patient ID: Phyllis Hernandez, female   DOB: 10/15/1950, 68 y.o.   MRN: [id number]  [facility] Progress Note   Assessment/ Plan:   1.  Multiple bilateral acute cerebral infarcts: Without evidence of SBE seen on TEE.  With consequent oropharyngeal dysphagia/dysphonia and plans noted for PEG tube placement by IR for nutritional support.  Awaiting evaluation by PT/OT again today to see what her mobility needs/abilities are.  Will attempt hemodialysis in recliner tomorrow if PT evaluation permits this. 2. ESRD: Restarted back on hemodialysis with next dialysis later today (continue to maintain a TTS schedule while here in the hospital).  She had been on dialysis between 01/06/2017 and 03/09/2018 after which she self-discontinued therapy until contact during this hospitalization.  Overall poor prognosis with sequelae of multiple bilateral acute cerebral infarcts/dysphagia requiring PEG tube.  Attempt hemodialysis in a recliner tomorrow (pending PT evaluation) and assess level of needs/requirement for transfer as this will be pivotal in deciding ability for outpatient dialysis. 3. Anemia: With significant iron deficiency, started intravenous iron to complement ongoing ESA. 4. CKD-MBD: Calcium level acceptable with significant hyperphosphatemia, will uptitrate Renvela. 5. Nutrition: Currently n.p.o with significant oropharyngeal dysphagia and awaiting reevaluation by SLP. 6. Hypertension: Blood pressure intermittently elevated, monitor with hemodialysis/UF  Subjective:   Denies any acute events overnight, leg pain better.   Objective:   BP (!) 117/46 (BP Location: Right Arm)   Pulse 93   Temp 98.6 F (37 C) (Oral)   Resp (!) 23   Ht 5\' 4"  (1.626 m)   Wt 83.3 kg   SpO2 97%   BMI 31.52 kg/m   Physical Exam: Gen: Comfortably sleeping in bed, awakens to calling out her name CVS: Pulse regular rhythm, normal rate, S1 and S2 normal Resp: Anteriorly clear to auscultation, no rales or  rhonchi.  Right IJ TDC Abd: Soft, obese, nontender Ext: Bilateral legs in Ace wrapping/soft booties, chronic lymphedema/venous stasis changes  Labs: BMET Recent Labs  Lab 03/16/19 0641 03/19/19 1306 03/21/19 0730  NA 135 139 136  K 4.2 4.8 3.3*  CL 99 99 96*  CO2 22 24 23   GLUCOSE 118* 129* 100*  BUN 30* 35* 26*  CREATININE 6.70* 7.11* 5.49*  CALCIUM 7.7* 8.1* 8.2*  PHOS 6.0* 7.0*  --    CBC Recent Labs  Lab 03/16/19 0641 03/19/19 1306 03/21/19 0730  WBC 11.3* 13.3* 14.2*  HGB 7.4* 7.2* 7.8*  HCT 24.5* 24.0* 26.5*  MCV 75.2* 75.2* 75.7*  PLT 193 269 319   Medications:    . amLODipine  10 mg Oral Daily  . aspirin  300 mg Rectal Daily  . Chlorhexidine Gluconate Cloth  6 each Topical Q0600  . Chlorhexidine Gluconate Cloth  6 each Topical Q0600  . darbepoetin (ARANESP) injection - DIALYSIS  100 mcg Intravenous Q Thu-HD  . doxercalciferol  2 mcg Intravenous Q T,Th,Sa-HD  . ezetimibe  10 mg Oral Daily  . feeding supplement (NEPRO CARB STEADY)  237 mL Oral BID BM  . heparin  5,000 Units Subcutaneous Q8H  . hydrocerin   Topical BID  . insulin aspart  0-5 Units Subcutaneous QHS  . insulin aspart  0-9 Units Subcutaneous TID WC  . mouth rinse  15 mL Mouth Rinse BID  . metoprolol tartrate  5 mg Intravenous Q6H  . multivitamin  1 tablet Oral QHS  . polyethylene glycol  17 g Oral BID  . senna  1 tablet Oral BID  . sevelamer carbonate  2,400 mg Oral  TID WC  . sodium chloride flush  3 mL Intravenous Q12H   Elmarie Shiley, MD 03/22/2019, 8:32 AM

## 2019-03-22 NOTE — Progress Notes (Signed)
Physical Therapy Treatment Patient Details Name: NICKISHA KEELIN MRN: [id number] DOB: 1951-04-15 Today's Date: 03/22/2019    History of Present Illness Pt is a 68 y.o. female admitted 03/08/19 with volume overload and hypertensive overload after missing HD sessions (per chart, has missed HD for approximately one year); pt opting to resume HD while admitted, but unable to tolerate complete session due to BLE pain. PMH includes ESRD (on HD), DM2, HTN, depression.    PT Comments    Pt non verbal during session, giving head nods on occasion to communicate. Pt was seen for mobility progression. On arrival to room pt was very fatigued and following commands inconstantly. With max A +2 pt was able to roll L and R for maximove pad placement. Pt has trouble initiating movement, but will assist in movement once therapists begin rolling. Pt was transferred to recliner chair via lift equipment and lift pad left in place at end of session. Current plan remains appropriate.     Follow Up Recommendations  SNF;Supervision/Assistance - 24 hour     Equipment Recommendations  Hospital bed    Recommendations for Other Services       Precautions / Restrictions Precautions Precautions: Fall;Other (comment) Precaution Comments: Bilateral feet/lower leg wounds wrapped in ace wrap, watch BP Required Braces or Orthoses: Other Brace Other Brace: una boot Restrictions Weight Bearing Restrictions: No    Mobility  Bed Mobility Overal bed mobility: Needs Assistance Bed Mobility: Rolling Rolling: Max assist;+2 for safety/equipment     Sit to supine: Max assist;+2 for physical assistance   General bed mobility comments: multimodal cues for hand placement and sequencing of rolling  Transfers Overall transfer level: Needs assistance Equipment used: Ambulation equipment used             General transfer comment: lifted to chair, lift pad left under pt for RN to return  Ambulation/Gait              General Gait Details: unable   Stairs             Wheelchair Mobility    Modified Rankin (Stroke Patients Only)       Balance     Sitting balance-Leahy Scale: Zero       Standing balance-Leahy Scale: Zero                              Cognition Arousal/Alertness: Lethargic Behavior During Therapy: Flat affect Overall Cognitive Status: Difficult to assess Area of Impairment: Attention;Following commands;Orientation                 Orientation Level: Disoriented to;Place;Time;Situation;Person(unable to state) Current Attention Level: Sustained   Following Commands: Follows one step commands inconsistently Safety/Judgement: Decreased awareness of safety;Decreased awareness of deficits Awareness: Emergent Problem Solving: Slow processing;Decreased initiation;Difficulty sequencing;Requires verbal cues;Requires tactile cues General Comments: flat affec, slow to process. Not verbalizing at all during session other than moaning 2/2 pain. Gave occasional nod of head to communicate when asked yes or no question.      Exercises      General Comments        Pertinent Vitals/Pain Pain Assessment: Faces Faces Pain Scale: Hurts little more Pain Location: unable to name source of pain, more grimacing with LLE and generalized with movement Pain Descriptors / Indicators: Grimacing Pain Intervention(s): Monitored during session;Limited activity within patient's tolerance;Repositioned    Home Living  Prior Function            PT Goals (current goals can now be found in the care plan section) Acute Rehab PT Goals Patient Stated Goal: not stated PT Goal Formulation: With patient Time For Goal Achievement: 03/24/19 Potential to Achieve Goals: Fair Progress towards PT goals: Progressing toward goals(lethargic)    Frequency    Min 2X/week      PT Plan Current plan remains appropriate    Co-evaluation  PT/OT/SLP Co-Evaluation/Treatment: Yes Reason for Co-Treatment: Complexity of the patient's impairments (multi-system involvement);Necessary to address cognition/behavior during functional activity;For patient/therapist safety PT goals addressed during session: Mobility/safety with mobility OT goals addressed during session: ADL's and self-care      AM-PAC PT "6 Clicks" Mobility   Outcome Measure  Help needed turning from your back to your side while in a flat bed without using bedrails?: Total Help needed moving from lying on your back to sitting on the side of a flat bed without using bedrails?: Total Help needed moving to and from a bed to a chair (including a wheelchair)?: Total Help needed standing up from a chair using your arms (e.g., wheelchair or bedside chair)?: Total Help needed to walk in hospital room?: Total Help needed climbing 3-5 steps with a railing? : Total 6 Click Score: 6    End of Session Equipment Utilized During Treatment: Other (comment)(lift equiptment) Activity Tolerance: Patient limited by lethargy;Patient limited by fatigue Patient left: with call bell/phone within reach;in chair(Lift pad under pt) Nurse Communication: Mobility status;Need for lift equipment PT Visit Diagnosis: Other abnormalities of gait and mobility (R26.89);Muscle weakness (generalized) (M62.81);History of falling (Z91.81)     Time: [id number] PT Time Calculation (min) (ACUTE ONLY): 27 min  Charges:  $Therapeutic Activity: 8-22 mins                     Benjiman Core, [place] Pager [phone] Acute Rehab   Allena Katz 03/22/2019, 12:33 PM

## 2019-03-22 NOTE — Progress Notes (Addendum)
   Subjective: Mrs and Mr. Walsworth were seen at bedside. They stated that they were doing well. Her husband states that she appears to be much improved to when he saw her last. We explained PEG tube placement including benefits and complications of placement. They stated their understanding. All questions and concerns were addressed.     Objective:  Vital signs in last 24 hours: Vitals:   03/22/19 0200 03/22/19 0400 03/22/19 0427 03/22/19 0500  BP:  (!) 117/46    Pulse: 93 91 93   Resp: 16 (!) 23 (!) 23   Temp:   98.6 F (37 C)   TempSrc:   Oral   SpO2: 95% 94% 97%   Weight:    83.3 kg  Height:    5\' 4"  (1.626 m)  Physical Exam Constitutional:      General: She is not in acute distress.    Appearance: She is not toxic-appearing.     Comments: Female lying comfortably in bed. Non verbal, able to answer questions by nodding and shaking her head. Using arms more today than she has in previous encounters.  No acute distress.   HENT:     Head: Normocephalic and atraumatic.  Cardiovascular:     Rate and Rhythm: Normal rate and regular rhythm.     Pulses: Normal pulses.     Heart sounds: No murmur. No friction rub. No gallop.   Pulmonary:     Effort: Pulmonary effort is normal.     Breath sounds: Normal breath sounds. No wheezing, rhonchi or rales.  Abdominal:     General: Bowel sounds are normal. There is distension.  Neurological:     Mental Status: She is alert.     Comments: Able to follow commands.      Assessment/Plan:  Principal Problem:   ESRD (end stage renal disease) ([place]) Active Problems:   Type 2 diabetes mellitus with complication, without long-term current use of insulin (HCC)   Anemia of chronic disease   Hypertensive urgency   Volume overload   Goals of care, counseling/discussion   Advance care planning   Palliative care encounter   CVA (cerebral vascular accident) ([place])  Acute CVA: - MRI shows multiple scattered acute ischemic infarcts involving the  bilateral cerebral hemispheres. Underlying age related cerebral atrophy with advanced chronic microvascular ischemic disease.   - Palliative working with patient's family for goals of care. We appreciate their assistance.  - IR eval and management for PEG tube placement. We spoke to IR and they have her placed for Monday.  VolumeOverload2/2 to ESRD: - Last dialysis session 03/21/2019 - Nephrology following, appreciate assistance.   Hypertension: - Continue Amlodipine 10 mg QD - Continue Lopressor 25 mg BID - Continue Hydralazine PRN Q6H - Continue Metoprolol IV  Bilateral LE wounds:  -Continue Eucerin. - continue changing wrappings as needed. - Continue Dilaudid 1-2 mg IV every dialysis session. - Continue Percocet/Roxicet Q4H PRN.  T2DM:  - Nonadherent to home diabetes medications.  - CBG: 122 - Continue SSI  Fever with Unknown Source:Resolved  VTE ppx: Heparin Code Status: FULL    Dispo: Anticipated discharge pending medical course.   Maudie Mercury, MD 03/22/2019, 12:44 PM Pager: [phone]

## 2019-03-22 NOTE — Consult Note (Signed)
Chief Complaint: Patient was seen in consultation today for gastrostomy placement.  Referring Physician(s): Dr. Rebeca Alert  Supervising Physician: Corrie Mckusick  Patient Status: [facility] - In-pt  History of Present Illness: Phyllis Hernandez is a 68 y.o. female with a past medical history significant for depression, anemia, DM, HTN, HLD, ESRD on HD and CVA with resultant dysphagia - IR has been requested to evaluate patient for possible gastrostomy placement. Ms. Grullon presented to [facility] ED on 9/18 with complaints of increased weakness for several days. She was found to have BP of 211/97 mmHg in the ED and was discovered to have missed HD for at least 2 months. She was admitted for further evaluation and management. Upon admission she was reported to be A&O x3 however her mental status continued to decline her admission despite several rounds of HD. MRI brain was obtained on 03/16/19 which showed multiple scattered acute ischemic infarcts involving the bilateral cerebral hemispheres as well as underlying age-related cerebral atrophy with advanced chronic microvascular ischemic disease. She was evaluated by SLP and found to be a high risk for aspiration due to dysphagia - a request was made to IR for possible gastrostomy placement.  Patient sitting in arm chair during exam, no family at bedside. She is able to answer my questions by nodding her head but falls asleep during my exam. She nods her head yes when asked if she knows about the feeding tube. She shakes her head no when asked if she is having any pain.  Past Medical History:  Diagnosis Date   Allergy    Anemia    Anemia in CKD (chronic kidney disease)    Depression    Diabetes mellitus without complication ([place])    Hyperlipidemia    Hypertension     Past Surgical History:  Procedure Laterality Date   AV FISTULA PLACEMENT Left 12/23/2016   Procedure: LEFT UPPER EXTREMITY ARTERIOVENOUS (AV) FISTULA CREATION;  Surgeon: Elam Dutch, MD;  Location: [place];  Service: Vascular;  Laterality: Left;   INSERTION OF DIALYSIS CATHETER Right 12/23/2016   Procedure: INSERTION OF DIALYSIS CATHETER;  Surgeon: Elam Dutch, MD;  Location: [place];  Service: Vascular;  Laterality: Right;   TEE WITHOUT CARDIOVERSION N/A 03/19/2019   Procedure: TRANSESOPHAGEAL ECHOCARDIOGRAM (TEE);  Surgeon: Skeet Latch, MD;  Location: [facility] ENDOSCOPY;  Service: Cardiovascular;  Laterality: N/A;   TUBAL LIGATION      Allergies: No known allergies  Medications: Prior to Admission medications   Medication Sig Start Date End Date Taking? Authorizing Provider  Insulin Pen Needle 29G X 12MM MISC To be used per instructions 3 times a day with meals and at bedtime. 08/10/17   Vanessa Kick, MD     Family History  Problem Relation Age of Onset   Hypertension Mother    Diabetes Mother    Hypertension Maternal Grandmother    Diabetes Maternal Grandmother     Social History   Socioeconomic History   Marital status: Legally Separated    Spouse name: Not on file   Number of children: Not on file   Years of education: Not on file   Highest education level: Not on file  Occupational History   Not on file  Social Needs   Financial resource strain: Not on file   Food insecurity    Worry: Not on file    Inability: Not on file   Transportation needs    Medical: Not on file    Non-medical: Not on file  Tobacco Use   Smoking status: Never Smoker   Smokeless tobacco: Never Used  Substance and Sexual Activity   Alcohol use: No    Frequency: Never   Drug use: No   Sexual activity: Not on file  Lifestyle   Physical activity    Days per week: Not on file    Minutes per session: Not on file   Stress: Not on file  Relationships   Social connections    Talks on phone: Not on file    Gets together: Not on file    Attends religious service: Not on file    Active member of club or organization: Not on file     Attends meetings of clubs or organizations: Not on file    Relationship status: Not on file  Other Topics Concern   Not on file  Social History Narrative   Not on file     Review of Systems: A 12 point ROS discussed and pertinent positives are indicated in the HPI above.  All other systems are negative.  Review of Systems  Unable to perform ROS: Patient nonverbal    Vital Signs: BP (!) 117/46 (BP Location: Right Arm)    Pulse 93    Temp 98.6 F (37 C) (Oral)    Resp (!) 23    Ht 5\' 4"  (1.626 m)    Wt 183 lb 10.3 oz (83.3 kg)    SpO2 97%    BMI 31.52 kg/m   Physical Exam Vitals signs and nursing note reviewed.  Constitutional:      Comments: Somnolent; nonverbal - able to answer some questions with head nods.  HENT:     Head: Normocephalic.  Cardiovascular:     Rate and Rhythm: Normal rate and regular rhythm.     Comments: (+) Right tunneled HD catheter in place Pulmonary:     Effort: Pulmonary effort is normal.     Breath sounds: Normal breath sounds.  Abdominal:     General: Bowel sounds are normal. There is no distension.     Palpations: Abdomen is soft.     Tenderness: There is no abdominal tenderness.  Skin:    General: Skin is warm and dry.  Neurological:     Mental Status: Mental status is at baseline.      MD Evaluation Airway: WNL Heart: WNL Abdomen: WNL Chest/ Lungs: WNL ASA  Classification: 4 Mallampati/Airway Score: Three   Imaging: Ct Abdomen Wo Contrast  Result Date: 03/21/2019 CLINICAL DATA:  Evaluation of anatomy for gastrostomy tube placement. EXAM: CT ABDOMEN WITHOUT CONTRAST TECHNIQUE: Multidetector CT imaging of the abdomen was performed following the standard protocol without IV contrast. COMPARISON:  CT dated April 20, 2018. FINDINGS: Lower chest: The lung bases are clear.The heart is significantly enlarged. Hepatobiliary: The liver is normal. Normal gallbladder.There is no biliary ductal dilation. Pancreas: Normal contours without  ductal dilatation. No peripancreatic fluid collection. Spleen: No splenic laceration or hematoma. Adrenals/Urinary Tract: --Adrenal glands: There is relatively stable nodularity of both adrenal glands. --Right kidney/ureter: No hydronephrosis or perinephric hematoma. --Left kidney/ureter: No hydronephrosis or perinephric hematoma. --Urinary bladder: Outside the field of view. Stomach/Bowel: --Stomach/Duodenum: Portions of the gastric body are posterior to the liver. The distal gastric body/antrum does not demonstrate colon or liver anteriorly. --Small bowel: No dilatation or inflammation. --Colon: There is oral contrast in the ascending colon and hepatic flexure. There is a small amount of contrast in the transverse colon. --Appendix: Outside the field of view. Vascular/Lymphatic: Extensive vascular  calcifications are noted involving virtually all of the arterial structures. --No retroperitoneal lymphadenopathy. --No mesenteric lymphadenopathy. Other: No ascites or free air. The abdominal wall is normal. Musculoskeletal. No acute displaced fractures. IMPRESSION: 1. No acute abnormality detected. 2. Hepatic in gastric anatomy as above. 3. Cardiomegaly. Aortic Atherosclerosis (ICD10-I70.0). Electronically Signed   By: Constance Holster M.D.   On: 03/21/2019 14:42   Ct Head Wo Contrast  Result Date: 03/13/2019 CLINICAL DATA:  Headache. EXAM: CT HEAD WITHOUT CONTRAST TECHNIQUE: Contiguous axial images were obtained from the base of the skull through the vertex without intravenous contrast. COMPARISON:  CT scan of August 21, 2009. FINDINGS: Brain: Mild chronic ischemic white matter disease is noted. No mass effect or midline shift is noted. Ventricular size is within normal limits. There is no evidence of mass lesion, hemorrhage or acute infarction. Vascular: No hyperdense vessel or unexpected calcification. Skull: Normal. Negative for fracture or focal lesion. Sinuses/Orbits: No acute finding. Other: None.  IMPRESSION: Mild chronic ischemic white matter disease. No acute intracranial abnormality seen. Electronically Signed   By: Marijo Conception M.D.   On: 03/13/2019 07:17   Mr Angio Head Wo Contrast  Result Date: 03/18/2019 CLINICAL DATA:  Stroke, follow-up. EXAM: MRA HEAD WITHOUT CONTRAST TECHNIQUE: Angiographic images of the Circle of Willis were obtained using MRA technique without intravenous contrast. COMPARISON:  Brain MRI 03/16/2019, head CT 03/13/2019 FINDINGS: The bilateral intracranial internal carotid arteries are patent without significant stenosis Right middle and anterior cerebral arteries patent without significant proximal stenosis. Left middle and anterior cerebral arteries patent without significant proximal stenosis. Intracranial vertebral arteries are patent without significant stenosis. Basilar artery is patent without significant stenosis. Bilateral posterior cerebral arteries patent without significant proximal stenosis. No intracranial aneurysm identified. IMPRESSION: No intracranial large vessel occlusion or proximal high-grade arterial stenosis. Electronically Signed   By: Kellie Simmering   On: 03/18/2019 12:55   Mr Brain Wo Contrast  Result Date: 03/16/2019 CLINICAL DATA:  Initial evaluation for acute altered mental status, unexplained. EXAM: MRI HEAD WITHOUT CONTRAST TECHNIQUE: Multiplanar, multiecho pulse sequences of the brain and surrounding structures were obtained without intravenous contrast. COMPARISON:  Prior CT from 03/13/2019. FINDINGS: Brain: Examination moderately degraded by motion artifact. Multiple scattered acute ischemic infarcts seen involving the bilateral cerebral hemispheres. These involve the deep and periventricular white matter of both cerebral hemispheres, with patchy involvement of the corpus callosum and basal ganglia. For reference purposes, largest area of infarction seen at the left basal ganglia and measures 12 mm. Small infarct involving the left  fornix/anterior commissure noted. No associated hemorrhage or mass effect. Infarcts are predominantly small vessel type in appearance. Gray-white matter differentiation otherwise maintained. No acute intracranial hemorrhage. Underlying age-related cerebral atrophy with advanced chronic microvascular ischemic disease. No mass lesion, midline shift, or mass effect. No hydrocephalus. No extra-axial fluid collection. Vascular: Major intracranial vascular flow voids are maintained at the skull base. Skull and upper cervical spine: Unremarkable. Sinuses/Orbits: Globes and orbital soft tissues demonstrate no acute finding. Paranasal sinuses are largely clear. No significant mastoid effusion. Other: None. IMPRESSION: 1. Multiple scattered acute ischemic infarcts involving the bilateral cerebral hemispheres as above. No associated hemorrhage or mass effect. 2. Underlying age-related cerebral atrophy with advanced chronic microvascular ischemic disease. Electronically Signed   By: Jeannine Boga M.D.   On: 03/16/2019 06:08   Dg Chest Port 1 View  Result Date: 03/13/2019 CLINICAL DATA:  Fever. EXAM: PORTABLE CHEST 1 VIEW COMPARISON:  03/08/2019. FINDINGS: Heart is enlarged. Right IJ dialysis  catheter is in place. Mild pulmonary vascular congestion is similar the prior exam. No effusion. No significant airspace consolidation is present. Catheter is stable. IMPRESSION: 1. Stable cardiomegaly and chronic pulmonary vascular congestion. 2. Right IJ dialysis catheter is stable. Electronically Signed   By: San Morelle M.D.   On: 03/13/2019 09:26   Dg Chest Port 1 View  Result Date: 03/08/2019 CLINICAL DATA:  Shortness of breath. EXAM: PORTABLE CHEST 1 VIEW COMPARISON:  Radiographs 03/25/2018 and 10/27/2017. FINDINGS: 0810 hours. The right IJ hemodialysis catheter is unchanged with the tip in the upper right atrium. There is stable cardiomegaly and aortic atherosclerosis. There is stable vascular congestion  without overt pulmonary edema, confluent airspace opacity, pleural effusion or pneumothorax. The bones appear unchanged. IMPRESSION: Radiographically stable appearance of the chest with cardiomegaly and chronic vascular congestion. No new findings. Electronically Signed   By: Richardean Sale M.D.   On: 03/08/2019 08:36   Vas US Carotid  Result Date: 03/18/2019 Carotid Arterial Duplex Study Indications:       CVA. Limitations        Today's exam was limited due to the high bifurcation of the                    carotid and patient unable to hold neck in a position for                    optimal imaging. Comparison Study:  No previous study available Performing Technologist: Toma Copier RVS  Examination Guidelines: A complete evaluation includes B-mode imaging, spectral Doppler, color Doppler, and power Doppler as needed of all accessible portions of each vessel. Bilateral testing is considered an integral part of a complete examination. Limited examinations for reoccurring indications may be performed as noted.  Right Carotid Findings: +----------+--------+--------+--------+--------------------+-------------------+             PSV cm/s EDV cm/s Stenosis Plaque Description   Comments             +----------+--------+--------+--------+--------------------+-------------------+  CCA Prox   91       10                                                          +----------+--------+--------+--------+--------------------+-------------------+  CCA Distal 73       13                diffuse and          mild plaque on the                                          heterogenous         far wall             +----------+--------+--------+--------+--------------------+-------------------+  ICA Prox   46       9        1-39%    heterogenous and     mild plaque  irregular                                  +----------+--------+--------+--------+--------------------+-------------------+  ICA Mid    75       13                                     mild intimal wall                                                                changes              +----------+--------+--------+--------+--------------------+-------------------+  ICA Distal 89       16                                     tortuous             +----------+--------+--------+--------+--------------------+-------------------+  ECA        66       1                                      difficult to                                                                     evaluate plaque due                                                              to high bifurcation  +----------+--------+--------+--------+--------------------+-------------------+ +----------+--------+-------+--------+-------------------+             PSV cm/s EDV cms Describe Arm Pressure (mmHG)  +----------+--------+-------+--------+-------------------+  Subclavian 130                                            +----------+--------+-------+--------+-------------------+ +---------+--------+--------+--------------------------------------------------+  Vertebral PSV cm/s EDV cm/s Not identified and patient unable to hold position                               for imaging                                         +---------+--------+--------+--------------------------------------------------+  Left Carotid Findings: +----------+--------+--------+--------+--------------------+-------------------+             PSV cm/s EDV cm/s Stenosis  Plaque Description   Comments             +----------+--------+--------+--------+--------------------+-------------------+  CCA Prox   100      13                                                          +----------+--------+--------+--------+--------------------+-------------------+  CCA Distal 78       14                                     mild intimal wall                                                                 changes              +----------+--------+--------+--------+--------------------+-------------------+  ICA Prox   91       11       1-39%    heterogenous and     mild plaque                                                 diffuse                                   +----------+--------+--------+--------+--------------------+-------------------+  ICA Mid    105      14                heterogenous         tortuous with mild                                                               plaque               +----------+--------+--------+--------+--------------------+-------------------+  ICA Distal 105      20                                     tortuous             +----------+--------+--------+--------+--------------------+-------------------+  ECA                                                        Not visualized       +----------+--------+--------+--------+--------------------+-------------------+ +----------+--------+--------+--------+-------------------+             PSV cm/s EDV cm/s Describe Arm Pressure (mmHG)  +----------+--------+--------+--------+-------------------+  Subclavian 127                                             +----------+--------+--------+--------+-------------------+ +---------+--------+--------++  Vertebral PSV cm/s EDV cm/s   +---------+--------+--------++  Summary: Right Carotid: Velocities in the right ICA are consistent with a 1-39% stenosis. Left Carotid: Velocities in the left ICA are consistent with a 1-39% stenosis. Vertebrals:  Bilateral vertebral arteries were not visualized. Subclavians: Bilateral subclavian arteries were not visualized. Normal flow              hemodynamics were seen in bilateral subclavian arteries. *See table(s) above for measurements and observations.  Electronically signed by Antony Contras MD on 03/18/2019 at 2:16:38 PM.    Final    Vas [place] Transcranial Doppler  Result Date: 03/19/2019  Transcranial Doppler  Indications: Stroke. Limitations for diagnostic windows: Unable to insonate right transtemporal window. Unable to insonate left transtemporal window. Performing Technologist: Oda Cogan RDMS, RVT  Examination Guidelines: A complete evaluation includes B-mode imaging, spectral Doppler, color Doppler, and power Doppler as needed of all accessible portions of each vessel. Bilateral testing is considered an integral part of a complete examination. Limited examinations for reoccurring indications may be performed as noted.  +----------+-------------+----------+-----------+-------+  RIGHT TCD  Right VM (cm) Depth (cm) Pulsatility Comment  +----------+-------------+----------+-----------+-------+  Opthalmic      25.00                   1.99              +----------+-------------+----------+-----------+-------+  ICA siphon     39.00                   1.77              +----------+-------------+----------+-----------+-------+  Vertebral     -20.00                   1.34              +----------+-------------+----------+-----------+-------+  +----------+------------+----------+-----------+-------+  LEFT TCD   Left VM (cm) Depth (cm) Pulsatility Comment  +----------+------------+----------+-----------+-------+  Opthalmic     26.00                   1.60              +----------+------------+----------+-----------+-------+  ICA siphon    37.00                   1.74              +----------+------------+----------+-----------+-------+  Vertebral     -29.00                  1.48              +----------+------------+----------+-----------+-------+  +------------+-------+-------+               VM cm/s Comment  +------------+-------+-------+  Prox Basilar -38.00           +------------+-------+-------+  Dist Basilar -41.00           +------------+-------+-------+ Summary:  Absent bitemporal windows limits evaluation of anterior circulation bilaterally. Normal mean flow velocities in both opthalmics, carotid siphonns,  vertebrals and basilar arteries. Globally elevated pulsatility indices suggests diffuse intracranial atherosclerosis. *See table(s) above for measurements and observations.  Diagnosing physician: Antony Contras MD Electronically  signed by Antony Contras MD on 03/19/2019 at 12:49:29 PM.    Final     Labs:  CBC: Recent Labs    03/16/19 0641 03/19/19 1306 03/21/19 0730 03/22/19 0825  WBC 11.3* 13.3* 14.2* 15.0*  HGB 7.4* 7.2* 7.8* 9.0*  HCT 24.5* 24.0* 26.5* 29.0*  PLT 193 269 319 317    COAGS: No results for input(s): INR, APTT in the last 8760 hours.  BMP: Recent Labs    03/16/19 0641 03/19/19 1306 03/21/19 0730 03/22/19 0825  NA 135 139 136 139  K 4.2 4.8 3.3* 3.9  CL 99 99 96* 101  CO2 22 24 23  21*  GLUCOSE 118* 129* 100* 110*  BUN 30* 35* 26* 20  CALCIUM 7.7* 8.1* 8.2* 8.4*  CREATININE 6.70* 7.11* 5.49* 4.07*  GFRNONAA 6* 6* 8* 11*  GFRAA 7* 6* 9* 13*    LIVER FUNCTION TESTS: Recent Labs    04/20/18 1042 04/25/18 1215  03/12/19 0813 03/15/19 0426 03/16/19 0641 03/19/19 1306  BILITOT 1.1 1.1  --   --   --   --   --   AST 27 26  --   --   --   --   --   ALT 54* 47*  --   --   --   --   --   ALKPHOS 47 46  --   --   --   --   --   PROT 7.3 6.8  --   --   --   --   --   ALBUMIN 4.1 3.8   < > 2.1* 1.7* 1.8* 1.7*   < > = values in this interval not displayed.    TUMOR MARKERS: No results for input(s): AFPTM, CEA, CA199, CHROMGRNA in the last 8760 hours.  Assessment and Plan:  68 y/o F with recent CVA and resultant dysphagia - IR has been consulted for possible gastrostomy placement. Patient and imaging has been reviewed by Dr. Earleen Newport who approves patient for procedure.  Will plan for gastrostomy placement Monday 10/5 pending any emergent procedures in IR - this was discussed with patient's son, RN and ordering provider today.   Patient to be NPO/hold tube feeds at midnight on 10/5, last dose of heparin SQ to be 10/4 at 2200. Pre-procedure labs ordered for AM  of 10/6.   Risks and benefits discussed with the patient's son Legrand Como via phone including, but not limited to the need for a barium enema during the procedure, bleeding, infection, peritonitis, or damage to adjacent structures.  All of the patient's son's questions were answered, patient is agreeable to proceed.  Consent signed and in IR control room.   Thank you for this interesting consult.  I greatly enjoyed meeting Phyllis Hernandez and look forward to participating in their care.  A copy of this report was sent to the requesting provider on this date.  Electronically Signed: Joaquim Nam, PA-C 03/22/2019, 1:27 PM   I spent a total of 40 Minutes in face to face in clinical consultation, greater than 50% of which was counseling/coordinating care for gastrostomy placement.

## 2019-03-22 NOTE — Progress Notes (Signed)
Occupational Therapy Treatment Patient Details Name: Phyllis Hernandez MRN: [id number] DOB: Nov 15, 1950 Today's Date: 03/22/2019    History of present illness Pt is a 68 y.o. female admitted 03/08/19 with volume overload and hypertensive overload after missing HD sessions (per chart, has missed HD for approximately one year); pt opting to resume HD while admitted, but unable to tolerate complete session due to BLE pain. PMH includes ESRD (on HD), DM2, HTN, depression.   OT comments  Pt with slow progression toward stated goals. Seen this session for mobility progression. Pt inconsistently following commands throughout session, occasionally nodding yes when prompted. She seems to only consistently respond to pain. Maximove used to lift pt in chair for mobility progression in preparation for BADLs. Pt with decreased alertness to engage in further activity. Seems to be having LLE knee pain throughout session. D/c recs remain appropriate for SNF. Will continue to follow.   Follow Up Recommendations  SNF;Supervision/Assistance - 24 hour    Equipment Recommendations  None recommended by OT    Recommendations for Other Services      Precautions / Restrictions Precautions Precautions: Fall;Other (comment) Precaution Comments: Bilateral feet/lower leg wounds wrapped in ace wrap, watch BP Restrictions Weight Bearing Restrictions: No       Mobility Bed Mobility Overal bed mobility: Needs Assistance Bed Mobility: Rolling Rolling: Max assist;+2 for safety/equipment         General bed mobility comments: multimodal cues for hand placement and sequencing of rolling  Transfers Overall transfer level: Needs assistance Equipment used: Ambulation equipment used             General transfer comment: lifted to chair, lift pad left under pt for RN to return    Balance                                           ADL either performed or assessed with clinical judgement    ADL Overall ADL's : Needs assistance/impaired                                       General ADL Comments: focused session on mobility progression, pt following commands inconsistently     Vision       Perception     Praxis      Cognition Arousal/Alertness: Lethargic Behavior During Therapy: Flat affect Overall Cognitive Status: Difficult to assess Area of Impairment: Attention;Following commands;Orientation                 Orientation Level: Disoriented to;Place;Time;Situation;Person(unable to state) Current Attention Level: Sustained   Following Commands: Follows one step commands inconsistently       General Comments: flat affec, slow to process. Not verbalizing at all during session other than moaning 2/2 pain        Exercises     Shoulder Instructions       General Comments      Pertinent Vitals/ Pain       Pain Assessment: Faces Faces Pain Scale: Hurts little more Pain Location: unable to name source of pain, more grimacing with LLE and generalized with movement Pain Descriptors / Indicators: Grimacing Pain Intervention(s): Limited activity within patient's tolerance;Monitored during session;Repositioned  Home Living  Prior Functioning/Environment              Frequency  Min 2X/week        Progress Toward Goals  OT Goals(current goals can now be found in the care plan section)  Progress towards OT goals: Progressing toward goals  Acute Rehab OT Goals Patient Stated Goal: not stated OT Goal Formulation: With patient Time For Goal Achievement: 03/24/19 Potential to Achieve Goals: Good  Plan Discharge plan remains appropriate;Frequency remains appropriate    Co-evaluation    PT/OT/SLP Co-Evaluation/Treatment: Yes Reason for Co-Treatment: Complexity of the patient's impairments (multi-system involvement);Necessary to address cognition/behavior during  functional activity;For patient/therapist safety;To address functional/ADL transfers PT goals addressed during session: Mobility/safety with mobility OT goals addressed during session: ADL's and self-care      AM-PAC OT "6 Clicks" Daily Activity     Outcome Measure   Help from another person eating meals?: A Lot Help from another person taking care of personal grooming?: A Lot Help from another person toileting, which includes using toliet, bedpan, or urinal?: Total Help from another person bathing (including washing, rinsing, drying)?: Total Help from another person to put on and taking off regular upper body clothing?: Total Help from another person to put on and taking off regular lower body clothing?: Total 6 Click Score: 8    End of Session Equipment Utilized During Treatment: Other (comment)(maximove)  OT Visit Diagnosis: Other abnormalities of gait and mobility (R26.89);Muscle weakness (generalized) (M62.81);Pain;History of falling (Z91.81);Other symptoms and signs involving cognitive function Pain - Right/Left: Left Pain - part of body: Hip;Leg   Activity Tolerance Patient tolerated treatment well   Patient Left in chair;with call bell/phone within reach   Nurse Communication Mobility status;Need for lift equipment        Time: [id number] OT Time Calculation (min): 25 min  Charges: OT Treatments $Self Care/Home Management : 8-22 mins  Zenovia Jarred, MSOT, OTR/L Behavioral Health OT/ Acute Relief OT [facility] Office: [phone]  Zenovia Jarred 03/22/2019, 12:01 PM

## 2019-03-22 NOTE — Progress Notes (Signed)
  Date: 03/22/2019  Patient name: Phyllis Hernandez  Medical record number: [id number]  Date of birth: 05-10-1951        I have seen and evaluated this patient and I have discussed the plan of care with the house staff. Please see their note for complete details. I concur with their findings with the following additions/corrections: Ms. Gursky was seen this morning on team rounds.  Her son was at the bedside.  She was following commands today and seemed improved compared to yesterday when seen in hemodialysis.  Her son is very grateful for the care she is receiving in the improvements she is making.  We have explained the PEG procedure and risk and benefits and she is on the schedule to have it placed Monday, 5 October by IR.  Bartholomew Crews, MD 03/22/2019, 4:28 PM

## 2019-03-22 NOTE — Progress Notes (Signed)
SLP Cancellation Note  Patient Details Name: Phyllis Hernandez MRN: [id number] DOB: [birth year]   Cancelled treatment:       Reason Eval/Treat Not Completed: Medical issues which prohibited therapy. PO trials held as pt is NPO pending PEG placement today. Will f/u as able.   Venita Sheffield Ridwan Bondy 03/22/2019, 9:56 AM  [name], M.A. [place] Acute Environmental education officer [phone] Office [phone]

## 2019-03-23 ENCOUNTER — Inpatient Hospital Stay (HOSPITAL_COMMUNITY): Payer: Medicare Other

## 2019-03-23 LAB — RENAL FUNCTION PANEL
Albumin: 1.7 g/dL — ABNORMAL LOW (ref 3.5–5.0)
Anion gap: 15 (ref 5–15)
BUN: 30 mg/dL — ABNORMAL HIGH (ref 8–23)
CO2: 24 mmol/L (ref 22–32)
Calcium: 8.5 mg/dL — ABNORMAL LOW (ref 8.9–10.3)
Chloride: 100 mmol/L (ref 98–111)
Creatinine, Ser: 5.71 mg/dL — ABNORMAL HIGH (ref 0.44–1.00)
GFR calc Af Amer: 8 mL/min — ABNORMAL LOW (ref >60)
GFR calc non Af Amer: 7 mL/min — ABNORMAL LOW (ref >60)
Glucose, Bld: 124 mg/dL — ABNORMAL HIGH (ref 70–99)
Phosphorus: 5.6 mg/dL — ABNORMAL HIGH (ref 2.5–4.6)
Potassium: 3.7 mmol/L (ref 3.5–5.1)
Sodium: 139 mmol/L (ref 135–145)

## 2019-03-23 LAB — CBC
HCT: 26 % — ABNORMAL LOW (ref 36.0–46.0)
Hemoglobin: 7.7 g/dL — ABNORMAL LOW (ref 12.0–15.0)
MCH: 22.4 pg — ABNORMAL LOW (ref 26.0–34.0)
MCHC: 29.6 g/dL — ABNORMAL LOW (ref 30.0–36.0)
MCV: 75.8 fL — ABNORMAL LOW (ref 80.0–100.0)
Platelets: 313 10*3/uL (ref 150–400)
RBC: 3.43 MIL/uL — ABNORMAL LOW (ref 3.87–5.11)
RDW: 16 % — ABNORMAL HIGH (ref 11.5–15.5)
WBC: 16.7 10*3/uL — ABNORMAL HIGH (ref 4.0–10.5)
nRBC: 0.2 % (ref 0.0–0.2)

## 2019-03-23 LAB — GLUCOSE, CAPILLARY
Glucose-Capillary: 136 mg/dL — ABNORMAL HIGH (ref 70–99)
Glucose-Capillary: 89 mg/dL (ref 70–99)
Glucose-Capillary: 113 mg/dL — ABNORMAL HIGH (ref 70–99)
Glucose-Capillary: 92 mg/dL (ref 70–99)

## 2019-03-23 MED ORDER — HEPARIN SODIUM (PORCINE) 1000 UNIT/ML IJ SOLN
INTRAMUSCULAR | Status: AC
  Filled 2019-03-23: qty 1

## 2019-03-23 MED ORDER — DOXERCALCIFEROL 4 MCG/2ML IV SOLN
INTRAVENOUS | Status: AC
  Administered 2019-03-23: 2 ug via INTRAVENOUS
  Filled 2019-03-23: qty 2

## 2019-03-23 MED ORDER — HEPARIN SODIUM (PORCINE) 1000 UNIT/ML DIALYSIS
40.0000 [IU]/kg | INTRAMUSCULAR | Status: DC | PRN
  Filled 2019-03-23: qty 4

## 2019-03-23 NOTE — Progress Notes (Addendum)
   Subjective: Patient was seen and evaluated at HD unit this morning. No acute events overnight. She is resting and is comfortable. Denies pain.   Objective:  Vital signs in last 24 hours: Vitals:   03/23/19 0019 03/23/19 0300 03/23/19 0340 03/23/19 0400  BP:    (!) 111/31  Pulse:  72  80  Resp:  15  17  Temp: 98.7 F (37.1 C)  98.3 F (36.8 C)   TempSrc: Oral  Oral   SpO2: 95% 92%  95%  Weight:   83 kg   Height:   5\' 4"  (1.626 m)    Physical Exam  Constitutional: Resting in bed, getting HD currently, No distress.  Cardiovascular: Normal rate, regular rhythm and normal heart sounds.  Respiratory: Effort normal and breath sounds normal. No respiratory distress. No wheezes and no rale on anterior chest exam GI: Soft. No distension,  no tenderness.  Musculoskeletal: No edema.  Neurological: Is alert, non verbal, able to answer questions by nodding and shaking her head  Assessment/Plan:  Principal Problem:   ESRD (end stage renal disease) ([place]) Active Problems:   Type 2 diabetes mellitus with complication, without long-term current use of insulin (HCC)   Anemia of chronic disease   Hypertensive urgency   Volume overload   Goals of care, counseling/discussion   Advance care planning   Palliative care encounter   CVA (cerebral vascular accident) ([place])  Acute CVA: - MRI shows multiple scattered acute ischemic infarcts involving the bilateral cerebral hemispheres. Underlying age related cerebral atrophy with advanced chronic microvascular ischemic disease.   - Palliative working with patient's family for goals of care. We appreciate their assistance.  -Planned for PEG tube placement on Monday by IR due to swallow difficulty -Will hold Hep on procedure day  VolumeOverload2/2 to ESRD on arrival. Resolved with HD - Nephrology following, appreciate assistance  Hypertension: - Continue Amlodipine 10 mg QD - Continue Lopressor 25 mg BID - Continue Hydralazine PRN Q6H -  Continue Metoprolol IV  Bilateral LE wounds:  -Continue Eucerin. - continue changing wrappings as needed. - Continue Dilaudid 1-2 mg IV every dialysis session. - Continue Percocet/Roxicet Q4H PRN.  T2DM: BG stable with SSI - Nonadherent to home diabetes medications.  - Continue SSI  Leukocytosis:  Fever of unknown source on arrival:Resolved. BC remained negative off of abx. But patient now has leokocytosis that  trending up. No fever. No other evidence of infection. Not on steroid.  -Repeating BC, checking UA and UC and CXR -trend CBC  Microcytic anemia:  Getting Ferrous gluconate during HD Hb today 7.7 form 9 yesterday. No bleeding reported. Will trend H and H -CBC daily  VTE ppx: Heparin Code Status: FULL    Dispo: Anticipated discharge pending medical course.   Dewayne Hatch, MD 03/23/2019, 6:32 AM Pager: [phone]

## 2019-03-23 NOTE — Procedures (Signed)
Patient seen on Hemodialysis. BP (!) 86/47   Pulse 94   Temp 99 F (37.2 C) (Axillary)   Resp 12   Ht 5\' 4"  (1.626 m)   Wt 82 kg   SpO2 100%   BMI 31.03 kg/m   QB 400, UF goal 2L Tolerating treatment without complaints at this time.   Elmarie Shiley MD [facility]. Office # [phone] Pager # [phone] 11:30 AM

## 2019-03-23 NOTE — Progress Notes (Signed)
Report given to hemodialysis. Lajoyce Corners, RN

## 2019-03-24 ENCOUNTER — Inpatient Hospital Stay (HOSPITAL_COMMUNITY): Payer: Medicare Other

## 2019-03-24 DIAGNOSIS — D72829 Elevated white blood cell count, unspecified: Secondary | ICD-10-CM

## 2019-03-24 DIAGNOSIS — Z9889 Other specified postprocedural states: Secondary | ICD-10-CM

## 2019-03-24 DIAGNOSIS — D509 Iron deficiency anemia, unspecified: Secondary | ICD-10-CM

## 2019-03-24 LAB — GLUCOSE, CAPILLARY
Glucose-Capillary: 127 mg/dL — ABNORMAL HIGH (ref 70–99)
Glucose-Capillary: 119 mg/dL — ABNORMAL HIGH (ref 70–99)
Glucose-Capillary: 112 mg/dL — ABNORMAL HIGH (ref 70–99)
Glucose-Capillary: 108 mg/dL — ABNORMAL HIGH (ref 70–99)

## 2019-03-24 LAB — CBC
HCT: 30.6 % — ABNORMAL LOW (ref 36.0–46.0)
Hemoglobin: 8.9 g/dL — ABNORMAL LOW (ref 12.0–15.0)
MCH: 22.6 pg — ABNORMAL LOW (ref 26.0–34.0)
MCHC: 29.1 g/dL — ABNORMAL LOW (ref 30.0–36.0)
MCV: 77.7 fL — ABNORMAL LOW (ref 80.0–100.0)
Platelets: 378 K/uL (ref 150–400)
RBC: 3.94 MIL/uL (ref 3.87–5.11)
RDW: 16.3 % — ABNORMAL HIGH (ref 11.5–15.5)
WBC: 18.1 K/uL — ABNORMAL HIGH (ref 4.0–10.5)
nRBC: 0.7 % — ABNORMAL HIGH (ref 0.0–0.2)

## 2019-03-24 LAB — BASIC METABOLIC PANEL
Anion gap: 16 — ABNORMAL HIGH (ref 5–15)
BUN: 22 mg/dL (ref 8–23)
CO2: 23 mmol/L (ref 22–32)
Calcium: 9 mg/dL (ref 8.9–10.3)
Chloride: 99 mmol/L (ref 98–111)
Creatinine, Ser: 4.38 mg/dL — ABNORMAL HIGH (ref 0.44–1.00)
GFR calc Af Amer: 11 mL/min — ABNORMAL LOW (ref >60)
GFR calc non Af Amer: 10 mL/min — ABNORMAL LOW (ref >60)
Glucose, Bld: 127 mg/dL — ABNORMAL HIGH (ref 70–99)
Potassium: 4.3 mmol/L (ref 3.5–5.1)
Sodium: 138 mmol/L (ref 135–145)

## 2019-03-24 MED ORDER — ALBUMIN HUMAN 25 % IV SOLN
25.0000 g | Freq: Once | INTRAVENOUS | Status: AC
  Administered 2019-03-24: 10:00:00 25 g via INTRAVENOUS
  Filled 2019-03-24: qty 100

## 2019-03-24 MED ORDER — METOPROLOL TARTRATE 5 MG/5ML IV SOLN
5.0000 mg | Freq: Four times a day (QID) | INTRAVENOUS | Status: DC
  Administered 2019-03-25: 06:00:00 5 mg via INTRAVENOUS
  Filled 2019-03-24: qty 5

## 2019-03-24 MED ORDER — AMLODIPINE BESYLATE 5 MG PO TABS
5.0000 mg | ORAL_TABLET | Freq: Every day | ORAL | Status: DC
  Filled 2019-03-24: qty 1

## 2019-03-24 NOTE — Progress Notes (Signed)
IMTS and Nephro MDs notified at bedside of patient hypotension since beginning of shift. Ordered Albumin administered. Patient continues with hypotension, IMTS and RR notified. Patient opens eyes when spoken to loudly or rubbed on shoulder at this time. Husband John at bedside, emotional support and plan of care education provided with verbalized understanding.

## 2019-03-24 NOTE — Progress Notes (Addendum)
Patient ID: Phyllis Hernandez, female   DOB: 1951/04/23, 68 y.o.   MRN: [id number]  [facility] Progress Note   Assessment/ Plan:   1.  Multiple bilateral acute cerebral infarcts: Without evidence of SBE seen on TEE.  With consequent oropharyngeal dysphagia/dysphonia and plans noted for PEG tube placement by IR for nutritional support.  She needs assistance with transfers per PT/OT note and at some point next week, we will attempt hemodialysis in recliner. 2. ESRD: Restarted back on hemodialysis with next dialysis later today (continue to maintain a TTS schedule while here in the hospital).  She had been on dialysis between 01/06/2017 and 03/09/2018 after which she self-discontinued therapy until contact during this hospitalization.  Overall poor prognosis with sequelae of multiple bilateral acute cerebral infarcts/dysphagia requiring PEG tube.  Attempt hemodialysis in a recliner this coming week to help assess her ability for tolerating outpatient dialysis. 3. Anemia: With significant iron deficiency, continue intravenous iron supplementation/ESA. 4. CKD-MBD: Calcium level acceptable with phosphorus level improving on Renvela. 5. Nutrition: Currently n.p.o with significant oropharyngeal dysphagia and awaiting reevaluation by SLP. 6.  Hypotension: No fever or focal signs of infection, albumin bolus given earlier this morning.  Continue to monitor for need to de-escalate antihypertensive therapy as previous noncompliance may have resulted in significantly elevated blood pressures and now "overcontrolled".  Subjective:   Hypotensive this morning, given albumin bolus.  Earlier had lower extremity x-rays that showed osteopenia/osteoporosis without acute osseous process.  I had a lengthy discussion regarding her overall picture and poor prognosis with her husband and the importance that we verified her ability to tolerate hemodialysis in a recliner before she could be discharged for outpatient  dialysis.  I also informed him that malnutrition was a negative prognostic indicator for her quality of life/morbidity and mortality on hemodialysis which would be likely further affected by her PEG tube.   Objective:   BP (!) 83/54 (BP Location: Right Wrist)   Pulse 86   Temp 98.7 F (37.1 C) (Axillary)   Resp 17   Ht 5\' 4"  (1.626 m)   Wt 79.6 kg   SpO2 100%   BMI 30.12 kg/m   Physical Exam: Gen: Comfortably sitting up in bed, husband by her side CVS: Pulse regular rhythm, normal rate, S1 and S2 normal Resp: Anteriorly clear to auscultation, no rales or rhonchi.  Right IJ TDC Abd: Soft, obese, nontender Ext: Bilateral legs in Ace wrapping/soft booties, chronic lymphedema/venous stasis changes  Labs: BMET Recent Labs  Lab 03/19/19 1306 03/21/19 0730 03/22/19 0825 03/23/19 0717 03/24/19 0757  NA 139 136 139 139 138  K 4.8 3.3* 3.9 3.7 4.3  CL 99 96* 101 100 99  CO2 24 23 21* 24 23  GLUCOSE 129* 100* 110* 124* 127*  BUN 35* 26* 20 30* 22  CREATININE 7.11* 5.49* 4.07* 5.71* 4.38*  CALCIUM 8.1* 8.2* 8.4* 8.5* 9.0  PHOS 7.0*  --   --  5.6*  --    CBC Recent Labs  Lab 03/21/19 0730 03/22/19 0825 03/23/19 0717 03/24/19 0757  WBC 14.2* 15.0* 16.7* 18.1*  HGB 7.8* 9.0* 7.7* 8.9*  HCT 26.5* 29.0* 26.0* 30.6*  MCV 75.7* 75.3* 75.8* 77.7*  PLT 319 317 313 378   Medications:    . amLODipine  5 mg Oral Daily  . aspirin  300 mg Rectal Daily  . Chlorhexidine Gluconate Cloth  6 each Topical Q0600  . Chlorhexidine Gluconate Cloth  6 each Topical Q0600  . darbepoetin (ARANESP) injection -  DIALYSIS  100 mcg Intravenous Q Thu-HD  . doxercalciferol  2 mcg Intravenous Q T,Th,Sa-HD  . ezetimibe  10 mg Oral Daily  . feeding supplement (NEPRO CARB STEADY)  237 mL Oral BID BM  . heparin  5,000 Units Subcutaneous Q8H  . hydrocerin   Topical BID  . insulin aspart  0-5 Units Subcutaneous QHS  . insulin aspart  0-9 Units Subcutaneous TID WC  . mouth rinse  15 mL Mouth Rinse BID   . metoprolol tartrate  5 mg Intravenous Q6H  . multivitamin  1 tablet Oral QHS  . polyethylene glycol  17 g Oral BID  . senna  1 tablet Oral BID  . sevelamer carbonate  2,400 mg Oral TID WC  . sodium chloride flush  3 mL Intravenous Q12H   Elmarie Shiley, MD 03/24/2019, 11:20 AM

## 2019-03-24 NOTE — Progress Notes (Addendum)
Subjective: Patient was seen and evaluated this morning. No acute events overnight except soft BP. She is alert. ,comfortable, answers our questions with nodding her head. Denies pain now that resting but grimaces when he examine her legs (L>R)  Objective:  Vital signs in last 24 hours: Vitals:   03/23/19 1935 03/23/19 2000 03/23/19 2314 03/24/19 0410  BP: (!) 124/51 (!) 95/43 96/65 (!) 93/44  Pulse: 89  87   Resp: 17 (!) 21 (!) 21 (!) 23  Temp: 98.6 F (37 C) 98.6 F (37 C) 99.1 F (37.3 C) 98.1 F (36.7 C)  TempSrc: Oral Oral Oral Oral  SpO2: 96% 98% 96% 100%  Weight:    79.6 kg  Height:       Physical Exam  Constitutional:Wel developed, No distress, Cardiovascular: RRR, no murmur Respiratory: Effort normal, no respiratory distress. No wheezes, no rale  GI: Soft. No distension,  no tenderness.  Neurological: Is alert, non verbal, able to react to and answer questions by nodding and shaking her head, moves her upper extremities. Bilateral leg ulcer, no active infection, deeper on left leg, sever skin thickening. Unable to detect pulses by POC doppler US (due to sever skin thickening?) but toes are warm and acceptable perfusion  Assessment/Plan:  Principal Problem:   ESRD (end stage renal disease) ([place]) Active Problems:   Type 2 diabetes mellitus with complication, without long-term current use of insulin (HCC)   Anemia of chronic disease   Hypertensive urgency   Volume overload   Goals of care, counseling/discussion   Advance care planning   Palliative care encounter   CVA (cerebral vascular accident) ([place])  Acute CVA: Non verbal with difficulty swallowing  - MRI shows multiple scattered acute ischemic infarcts involving the bilateral cerebral hemispheres. Underlying age related cerebral atrophy with advanced chronic microvascular ischemic disease.   - Palliative working with patient's family for goals of care. We appreciate their assistance.  -Planned for PEG tube  placement on Monday by IR due to swallow difficulty. NPO after midnight -Will hold Hep for procedure tomorrow  ESRD with missed HD for a year Was volume overload on arrival that resolved with HD - Nephrology following, appreciate assistance  Hypotenison? ADDENDUM: RN reported patient has low BP today at 47/37. Patient is sleeping but easy to arouse. BP cuff is at lower arm due to limitation to place the cuff having HD cath and IV. BP does not appear to be correct given her mental status is ok. Changed BP cuff. Also received Albumin by nephrology and changed place of the BP cuff repeated BP at 100s/50s. (this is post albumin though) -Will monitor BP and mental status. -Holding BP medications -If hypotensive, check mental status and check lactic acid. If concerning and suspicious for chock, will contact pccm  Bilateral LE wounds:  - CXr with no evidence of osteo.  - MRI pending -Continue Eucerin. - continue changing wrappings as needed. - Continue Dilaudid 1-2 mg IV every dialysis session. - Continue Percocet/Roxicet Q4H PRN.  T2DM: BG stable with SSI - Nonadherent to home diabetes medications.  - Continue SSI  Leukocytosis:  No fever First BC remained negative off of abx. Repeated BC is pending CXR 10/3 with no new finding, no evidence of PNA But patient now has leokocytosis that trending up. No fever. No other evidence of infection. Not on steroid.  -f/u BC, checking UA and UC (if can get urine sample) -Trend CBC -MRI to r/o osteomyelitis  Microcytic anemia:  S/p 1 u  prbc Getting Ferrous gluconate during HD Hb stable at 9 No bleeding reported. Will trend H and H -CBC daily  VTE ppx: Heparin (holding for procedure tomorrow) Code Status: FULL    Dispo: Anticipated discharge pending medical course.   Dewayne Hatch, MD 03/24/2019, 6:01 AM Pager: [phone]

## 2019-03-24 NOTE — Progress Notes (Signed)
Patient with hypotension this am:  BP 83/54, 63/43, 53/22, 46/27.  She has received 50 g Albumin.  She is responsive but non verbal which is her current baseline.  BP now 101/50.  Resident also at bedside spoke with husband.  RN to call if assistance needed.

## 2019-03-24 NOTE — Progress Notes (Signed)
  Date: 03/24/2019  Patient name: KEANDRIA GUISTI  Medical record number: [id number]  Date of birth: 1951-05-21        I have seen and evaluated this patient and I have discussed the plan of care with the house staff. Please see their note for complete details. I concur with their findings with the following additions/corrections: Ms. Stratmann was seen this morning on team rounds.  She was alert and following basic commands.  We unwrapped her lower extremity wounds and were concerned for osteomyelitis with the left leg wound looking worse than the right.  Plain films are negative for osteoporosis but we will proceed with MRI bilaterally.  We are unable to get a Doppler pulse likely due to her thick lower extremity skin.  Her feet were warm with a decent capillary refill.  Her systolic blood pressure has been reported as being as low as the [id number] systolic.  When Dr. Myrtie Hawk worked with the nursing team and the cuff was repositioned, systolic blood pressure was 101.  However, this was after albumin infusion.  Her amlodipine has been held today.  We will continue to watch her blood pressure, hold her amlodipine.  If there is a concern that she truly is hypotensive, I would recommend checking a lactic acid and then discussing further treatment with the family and critical care.  Bartholomew Crews, MD 03/24/2019, 3:38 PM

## 2019-03-25 ENCOUNTER — Inpatient Hospital Stay (HOSPITAL_COMMUNITY): Payer: Medicare Other

## 2019-03-25 ENCOUNTER — Encounter (HOSPITAL_COMMUNITY): Payer: Self-pay | Admitting: Interventional Radiology

## 2019-03-25 DIAGNOSIS — I959 Hypotension, unspecified: Secondary | ICD-10-CM

## 2019-03-25 DIAGNOSIS — R1310 Dysphagia, unspecified: Secondary | ICD-10-CM

## 2019-03-25 HISTORY — PX: IR GASTROSTOMY TUBE MOD SED: IMG625

## 2019-03-25 LAB — GLUCOSE, CAPILLARY
Glucose-Capillary: 116 mg/dL — ABNORMAL HIGH (ref 70–99)
Glucose-Capillary: 111 mg/dL — ABNORMAL HIGH (ref 70–99)
Glucose-Capillary: 155 mg/dL — ABNORMAL HIGH (ref 70–99)
Glucose-Capillary: 117 mg/dL — ABNORMAL HIGH (ref 70–99)

## 2019-03-25 LAB — BASIC METABOLIC PANEL
Anion gap: 19 — ABNORMAL HIGH (ref 5–15)
BUN: 36 mg/dL — ABNORMAL HIGH (ref 8–23)
CO2: 21 mmol/L — ABNORMAL LOW (ref 22–32)
Calcium: 9.5 mg/dL (ref 8.9–10.3)
Chloride: 98 mmol/L (ref 98–111)
Creatinine, Ser: 6.1 mg/dL — ABNORMAL HIGH (ref 0.44–1.00)
GFR calc Af Amer: 8 mL/min — ABNORMAL LOW (ref >60)
GFR calc non Af Amer: 7 mL/min — ABNORMAL LOW (ref >60)
Glucose, Bld: 133 mg/dL — ABNORMAL HIGH (ref 70–99)
Potassium: 4.4 mmol/L (ref 3.5–5.1)
Sodium: 138 mmol/L (ref 135–145)

## 2019-03-25 LAB — CBC
HCT: 28.3 % — ABNORMAL LOW (ref 36.0–46.0)
Hemoglobin: 8.6 g/dL — ABNORMAL LOW (ref 12.0–15.0)
MCH: 23.1 pg — ABNORMAL LOW (ref 26.0–34.0)
MCHC: 30.4 g/dL (ref 30.0–36.0)
MCV: 76.1 fL — ABNORMAL LOW (ref 80.0–100.0)
Platelets: 323 10*3/uL (ref 150–400)
RBC: 3.72 MIL/uL — ABNORMAL LOW (ref 3.87–5.11)
RDW: 16.8 % — ABNORMAL HIGH (ref 11.5–15.5)
WBC: 17.7 10*3/uL — ABNORMAL HIGH (ref 4.0–10.5)
nRBC: 0.6 % — ABNORMAL HIGH (ref 0.0–0.2)

## 2019-03-25 LAB — PROTIME-INR
INR: 1.3 — ABNORMAL HIGH (ref 0.8–1.2)
Prothrombin Time: 16.1 seconds — ABNORMAL HIGH (ref 11.4–15.2)

## 2019-03-25 MED ORDER — CHLORHEXIDINE GLUCONATE CLOTH 2 % EX PADS
6.0000 | MEDICATED_PAD | Freq: Every day | CUTANEOUS | Status: DC
  Administered 2019-03-26 – 2019-03-30 (×5): 6 via TOPICAL

## 2019-03-25 MED ORDER — LIDOCAINE HCL 1 % IJ SOLN
INTRAMUSCULAR | Status: AC | PRN
  Administered 2019-03-25: 10 mL

## 2019-03-25 MED ORDER — LIDOCAINE HCL 1 % IJ SOLN
INTRAMUSCULAR | Status: AC
  Filled 2019-03-25: qty 20

## 2019-03-25 MED ORDER — FENTANYL CITRATE (PF) 100 MCG/2ML IJ SOLN
INTRAMUSCULAR | Status: AC | PRN
  Administered 2019-03-25: 50 ug via INTRAVENOUS

## 2019-03-25 MED ORDER — IOHEXOL 300 MG/ML  SOLN
50.0000 mL | Freq: Once | INTRAMUSCULAR | Status: AC | PRN
  Administered 2019-03-25: 17:00:00 15 mL

## 2019-03-25 MED ORDER — ASPIRIN 325 MG PO TABS
325.0000 mg | ORAL_TABLET | Freq: Every day | ORAL | Status: DC
  Administered 2019-03-26: 15:00:00 325 mg via ORAL

## 2019-03-25 MED ORDER — MIDAZOLAM HCL 2 MG/2ML IJ SOLN
INTRAMUSCULAR | Status: AC
  Filled 2019-03-25: qty 2

## 2019-03-25 MED ORDER — GLUCAGON HCL RDNA (DIAGNOSTIC) 1 MG IJ SOLR
INTRAMUSCULAR | Status: AC
  Filled 2019-03-25: qty 1

## 2019-03-25 MED ORDER — HEPARIN SODIUM (PORCINE) 5000 UNIT/ML IJ SOLN
5000.0000 [IU] | Freq: Three times a day (TID) | INTRAMUSCULAR | Status: DC
  Administered 2019-03-26 – 2019-04-02 (×21): 5000 [IU] via SUBCUTANEOUS
  Filled 2019-03-25 (×13): qty 1

## 2019-03-25 MED ORDER — FENTANYL CITRATE (PF) 100 MCG/2ML IJ SOLN
INTRAMUSCULAR | Status: AC
  Filled 2019-03-25: qty 2

## 2019-03-25 MED ORDER — GLUCAGON HCL (RDNA) 1 MG IJ SOLR
INTRAMUSCULAR | Status: AC | PRN
  Administered 2019-03-25: 1 mg via INTRAVENOUS

## 2019-03-25 MED ORDER — CEFAZOLIN SODIUM-DEXTROSE 2-4 GM/100ML-% IV SOLN
INTRAVENOUS | Status: AC
  Filled 2019-03-25: qty 100

## 2019-03-25 MED ORDER — MIDAZOLAM HCL 2 MG/2ML IJ SOLN
INTRAMUSCULAR | Status: AC | PRN
  Administered 2019-03-25: 1 mg via INTRAVENOUS

## 2019-03-25 NOTE — Procedures (Signed)
Interventional Radiology Procedure Note  Procedure: Placement of percutaneous 20F pull-through gastrostomy tube. Complications: None Recommendations: - NPO except for sips and chips remainder of today and overnight - Maintain G-tube to LWS until tomorrow morning  - May advance diet as tolerated and begin using tube tomorrow morning  Signed,  Heath K. McCullough, MD   

## 2019-03-25 NOTE — Progress Notes (Signed)
Subjective:  Phyllis Hernandez was seen at bedside this morning. She is nonverbal, but did clear her throat several times during our encounter. We explained that she is getting a PEG tube today.   Objective:  Vital signs in last 24 hours: Vitals:   03/25/19 0400 03/25/19 0606 03/25/19 0608 03/25/19 0733  BP: (!) 135/39 (!) 148/116 (!) 129/42 (!) 112/47  Pulse: 87   84  Resp: 16 19 19 15   Temp:    (!) 97.4 F (36.3 C)  TempSrc:    Oral  SpO2: 94%   94%  Weight:      Height:       Physical Exam Vitals signs and nursing note reviewed.  Constitutional:      Comments: Nonverbal, shaking and nodding head in answer to yes or no questions.   HENT:     Head: Normocephalic.  Cardiovascular:     Rate and Rhythm: Normal rate and regular rhythm.     Heart sounds: No murmur. No gallop.   Pulmonary:     Effort: Pulmonary effort is normal.     Breath sounds: Normal breath sounds. No wheezing, rhonchi or rales.  Abdominal:     General: Bowel sounds are normal.     Tenderness: There is no abdominal tenderness.  Musculoskeletal:     Right lower leg: No edema.     Left lower leg: No edema.  Neurological:     Mental Status: She is alert.     Assessment/Plan:  Principal Problem:   ESRD (end stage renal disease) ([place]) Active Problems:   Type 2 diabetes mellitus with complication, without long-term current use of insulin (HCC)   Anemia of chronic disease   Hypertensive urgency   Volume overload   Goals of care, counseling/discussion   Advance care planning   Palliative care encounter   CVA (cerebral vascular accident) ([place])  Acute CVA: Non verbal with difficulty swallowing  - MRI shows multiple scattered acute ischemic infarcts involving the bilateral cerebral hemispheres. Underlying age related cerebral atrophy with advanced chronic microvascular ischemic disease.   - Palliative working with patient's family for goals of care. We appreciate their assistance.  - PEG placement today.  Heparin held.  ESRD with missed HD for a year Was volume overload on arrival that resolved with HD - Nephrology following, appreciate assistance  Hypotenison: ADDENDUM: RN reported patient has low BP today at 47/37. Patient is sleeping but easy to arouse. BP cuff is at lower arm due to limitation to place the cuff having HD cath and IV. BP does not appear to be correct given her mental status is ok. BP cuff changed and given albumin by nephrology. Corrected to 100s/50s.  - Blood pressures have continued to increase (systolic [id number], diastolic [id number]) -Will monitor BP and mental status. -Holding BP medications -If hypotensive, check mental status and check lactic acid. If concerning and suspicious for chock, will contact pccm  Bilateral LE wounds:  - XR: No evidence of osteo.  - MRI: Pending -Continue Eucerin. - continue changing wrappings as needed. - Continue Dilaudid 1-2 mg IV every dialysis session. - Continue Percocet/Roxicet Q4H PRN.  T2DM: BG stable with SSI - Nonadherent to home diabetes medications.  - Continue SSI  Leukocytosis:  - CXR 10/3 with no new finding, no evidence of PNA, but patient now has leokocytosis that is uptrending. She has no fever, evidence of infection, or medication cause like steroids.  - Blood cultures: NGTD  - F/u BC, checking UA and  UC (if can get urine sample) - Trend CBC - MRI to r/o osteomyelitis  Microcytic anemia:  - S/p 1 u prbc - Getting Ferrous gluconate during HD - Hb: at 8.6 - No bleeding reported. Will trend H and H - CBC daily  VTE ppx: Heparin (holding for procedure tomorrow) Code Status: FULL    Dispo: Anticipated discharge pending medical course.   Maudie Mercury, MD 03/25/2019, 10:10 AM Pager: [phone]

## 2019-03-25 NOTE — Care Management Important Message (Signed)
Important Message  Patient Details  Name: Phyllis Hernandez MRN: [id number] Date of Birth: [birth year]   Medicare Important Message Given:  Yes     Shelda Altes 03/25/2019, 2:12 PM

## 2019-03-25 NOTE — Progress Notes (Signed)
Patient ID: Phyllis Hernandez, female   DOB: 01-Oct-1950, 68 y.o.   MRN: [id number]  [facility] Progress Note   Assessment/ Plan:    1.  Multiple bilateral acute cerebral infarcts:  Without evidence of SBE seen on TEE.  With consequent oropharyngeal dysphagia/dysphonia and plans noted for PEG tube placement by IR for nutritional support.  She needs assistance with transfers per PT/OT note.  She will need to be able to complete HD in a recliner to be acceptable for outpatient dialysis.   2. ESRD:   On a TTS schedule here for now. She had been on dialysis between 01/06/2017 and 03/09/2018 after which she self-discontinued therapy until contact during this hospitalization.  Overall poor prognosis with sequelae of multiple bilateral acute cerebral infarcts/dysphagia requiring PEG tube.   - no acute indication for HD today - next tx 10/6 per TTS.  Lower calcium bath next tx. - Will attempt hemodialysis in a recliner next treatment on 10/6 to help assess her ability for tolerating outpatient dialysis. - will investigate if access issues - appears has LUE AVF with bruit and thrill   3. Anemia chronic disease: With significant iron deficiency, continue intravenous iron supplementation/ESA.   4. Secondary hyperparathyroidism, hyperphos: lower calcium bath next tx.  phosphorus level improving on Renvela.  Check phos in AM  5. Nutrition: oropharyngeal dysphagia; speech therapy is consulted  6.  Hypotension:  Hypotension on 10/4 - now improved.  Note t max 99.4.  Continue to monitor for need to de-escalate antihypertensive therapy as previous noncompliance may have resulted in significantly elevated blood pressures and now "overcontrolled".  Noted amlodipine held 10/4 - will hold again for now.  Noted metoprolol ordered per primary.  Blood cultures from 10/3 NGTD   Subjective:   Last had HD on 10/3 with 1.6 kg UF.  Hypotension has improved today.  Her amlodipine was held yesterday.   Review  of systems:  Denies shortness of breath  Has been NPO  No n/v   Objective:   BP (!) 129/42 (BP Location: Right Arm)   Pulse 87   Temp 99.2 F (37.3 C) (Oral)   Resp 19   Ht 5\' 4"  (1.626 m)   Wt 79.6 kg   SpO2 94%   BMI 30.12 kg/m   Physical Exam:  Gen: adult female in bed in NAD CVS: S1 and S2; no rub; tachy Resp: Anteriorly clear to auscultation, no rales or rhonchi.   Abd: Soft, obese, nontender Ext: no edema appreciated; bilateral legs in Ace wrapping/soft booties, chronic lymphedema/venous stasis changes Neuro - dysarthria but provides her name Access: right IJ tunneled dialysis catheter; LUE AVF bruit and thrill   Labs: BMET Recent Labs  Lab 03/19/19 1306 03/21/19 0730 03/22/19 0825 03/23/19 0717 03/24/19 0757 03/25/19 0550  NA 139 136 139 139 138 138  K 4.8 3.3* 3.9 3.7 4.3 4.4  CL 99 96* 101 100 99 98  CO2 24 23 21* 24 23 21*  GLUCOSE 129* 100* 110* 124* 127* 133*  BUN 35* 26* 20 30* 22 36*  CREATININE 7.11* 5.49* 4.07* 5.71* 4.38* 6.10*  CALCIUM 8.1* 8.2* 8.4* 8.5* 9.0 9.5  PHOS 7.0*  --   --  5.6*  --   --    CBC Recent Labs  Lab 03/22/19 0825 03/23/19 0717 03/24/19 0757 03/25/19 0550  WBC 15.0* 16.7* 18.1* 17.7*  HGB 9.0* 7.7* 8.9* 8.6*  HCT 29.0* 26.0* 30.6* 28.3*  MCV 75.3* 75.8* 77.7* 76.1*  PLT 317  313 378 323   Medications:    . amLODipine  5 mg Oral Daily  . Chlorhexidine Gluconate Cloth  6 each Topical Q0600  . Chlorhexidine Gluconate Cloth  6 each Topical Q0600  . darbepoetin (ARANESP) injection - DIALYSIS  100 mcg Intravenous Q Thu-HD  . doxercalciferol  2 mcg Intravenous Q T,Th,Sa-HD  . ezetimibe  10 mg Oral Daily  . feeding supplement (NEPRO CARB STEADY)  237 mL Oral BID BM  . hydrocerin   Topical BID  . insulin aspart  0-5 Units Subcutaneous QHS  . insulin aspart  0-9 Units Subcutaneous TID WC  . mouth rinse  15 mL Mouth Rinse BID  . metoprolol tartrate  5 mg Intravenous Q6H  . multivitamin  1 tablet Oral QHS  .  polyethylene glycol  17 g Oral BID  . senna  1 tablet Oral BID  . sevelamer carbonate  2,400 mg Oral TID WC  . sodium chloride flush  3 mL Intravenous Q12H   Claudia Desanctis 03/25/2019, 7:03 AM

## 2019-03-25 NOTE — Progress Notes (Signed)
  Date: 03/25/2019  Patient name: Phyllis Hernandez  Medical record number: [id number]  Date of birth: Dec 23, 1950        I have seen and evaluated this patient and I have discussed the plan of care with the house staff. Please see their note for complete details. I concur with their findings with the following additions/corrections: [name] was seen this AM on rounds. She was alert, non verbal. BP improved. Cont to hold amlodipine.  Stop metoprolol.  Bartholomew Crews, MD 03/25/2019, 4:24 PM

## 2019-03-25 NOTE — Progress Notes (Signed)
SLP Cancellation Note  Patient Details Name: Phyllis Hernandez MRN: [id number] DOB: 04-19-1951   Cancelled treatment:       Reason Eval/Treat Not Completed: Medical issues which prohibited therapy. Per chart, pt is scheduled today for PEG and is therefore NPO. Will f/u as able.   Venita Sheffield Lamees Gable 03/25/2019, 9:45 AM  [name], M.A. [place] Acute Environmental education officer [phone] Office [phone]

## 2019-03-25 NOTE — Progress Notes (Signed)
Palliative:  HPI: 68 y.o. female  with past medical history of ESRD but noncompliant with HD (last HD 02/22/19), HTN, HLD, diabetes, morbid obesity admitted on 03/08/2019 with fatigue and SOB. Found to have hypertensive urgency and volume overload needing HD. Hospitalization has been complicated by acute stroke with significant functional decline with resultant dysphagia and aphasia. Planning for PEG placement 10/5.   I met today at Phyllis Hernandez's bedside. She is awake but a little restless. She nods her head yes/no but difficult to know how much she is understanding. Husband, [name], is at bedside. He was asking me about how she is doing and if she has improved since her stroke. I explained to him that this is my first time seeing her since her stroke so it is hard to know if she has made much improvement. I spoke to him about her alertness and able to nod to questions being good but that she cannot speak or eat/swallow and these are bad signs. We discussed that the problem is that she may not improve past what she is doing right now and that is why the doctors are worried about her QOL with feeding tube and dialysis and unable to communicate to [place] her needs (if she is in pain, etc). He seems to have poor understanding of her condition and expectations from our initial meeting even though it is clearly documented that there have been multiple conversations from multiple teams explaining this to him.   Palliative will continue to follow and support but will not see daily.   Exam: Alert, restless. Likely confused. Nods head yes/no and follows simple commands. No distress. Breathing regular, unlabored. Abd flat, soft.   Plan: - Continue aggressive care per family wishes.   25 min  Vinie Sill, NP Palliative Medicine Team Pager [phone] (Please see [email or web address] for schedule) Team Phone [phone]    Greater than 50%  of this time was spent counseling and coordinating care related to the above  assessment and plan

## 2019-03-25 NOTE — Sedation Documentation (Signed)
Procedure started.

## 2019-03-25 NOTE — Sedation Documentation (Signed)
Patient is resting comfortably. 

## 2019-03-26 DIAGNOSIS — Z931 Gastrostomy status: Secondary | ICD-10-CM

## 2019-03-26 LAB — BASIC METABOLIC PANEL
Anion gap: 20 — ABNORMAL HIGH (ref 5–15)
BUN: 50 mg/dL — ABNORMAL HIGH (ref 8–23)
CO2: 21 mmol/L — ABNORMAL LOW (ref 22–32)
Calcium: 8.9 mg/dL (ref 8.9–10.3)
Chloride: 99 mmol/L (ref 98–111)
Creatinine, Ser: 7.68 mg/dL — ABNORMAL HIGH (ref 0.44–1.00)
GFR calc Af Amer: 6 mL/min — ABNORMAL LOW (ref >60)
GFR calc non Af Amer: 5 mL/min — ABNORMAL LOW (ref >60)
Glucose, Bld: 133 mg/dL — ABNORMAL HIGH (ref 70–99)
Potassium: 4.8 mmol/L (ref 3.5–5.1)
Sodium: 140 mmol/L (ref 135–145)

## 2019-03-26 LAB — GLUCOSE, CAPILLARY
Glucose-Capillary: 103 mg/dL — ABNORMAL HIGH (ref 70–99)
Glucose-Capillary: 116 mg/dL — ABNORMAL HIGH (ref 70–99)
Glucose-Capillary: 124 mg/dL — ABNORMAL HIGH (ref 70–99)
Glucose-Capillary: 174 mg/dL — ABNORMAL HIGH (ref 70–99)
Glucose-Capillary: 178 mg/dL — ABNORMAL HIGH (ref 70–99)

## 2019-03-26 LAB — PHOSPHORUS: Phosphorus: 5.8 mg/dL — ABNORMAL HIGH (ref 2.5–4.6)

## 2019-03-26 MED ORDER — EZETIMIBE 10 MG PO TABS
10.0000 mg | ORAL_TABLET | Freq: Every day | ORAL | Status: DC
  Administered 2019-03-27 – 2019-04-02 (×7): 10 mg
  Filled 2019-03-26 (×8): qty 1

## 2019-03-26 MED ORDER — METOPROLOL TARTRATE 12.5 MG HALF TABLET: 12.5000 mg | ORAL_TABLET | Freq: Two times a day (BID) | ORAL | Status: DC

## 2019-03-26 MED ORDER — OXYCODONE-ACETAMINOPHEN 5-325 MG PO TABS
1.0000 | ORAL_TABLET | ORAL | Status: DC | PRN
  Administered 2019-03-27 – 2019-03-30 (×2): 2
  Administered 2019-03-30: 10:00:00 1
  Administered 2019-03-31 – 2019-04-01 (×2): 2
  Filled 2019-03-26 (×4): qty 2

## 2019-03-26 MED ORDER — PRO-STAT SUGAR FREE PO LIQD
30.0000 mL | Freq: Two times a day (BID) | ORAL | Status: DC
  Administered 2019-03-26 – 2019-04-02 (×14): 30 mL
  Filled 2019-03-26 (×14): qty 30

## 2019-03-26 MED ORDER — SENNOSIDES 8.8 MG/5ML PO SYRP
5.0000 mL | ORAL_SOLUTION | Freq: Two times a day (BID) | ORAL | Status: DC
  Administered 2019-03-27 – 2019-04-02 (×12): 5 mL
  Filled 2019-03-26 (×15): qty 5

## 2019-03-26 MED ORDER — ACETAMINOPHEN 160 MG/5ML PO SOLN
650.0000 mg | Freq: Four times a day (QID) | ORAL | Status: DC | PRN
  Administered 2019-03-28 – 2019-04-02 (×2): 650 mg
  Filled 2019-03-26 (×2): qty 20.3

## 2019-03-26 MED ORDER — SEVELAMER CARBONATE 2.4 G PO PACK
2.4000 g | PACK | Freq: Three times a day (TID) | ORAL | Status: DC
  Administered 2019-03-27 – 2019-03-29 (×7): 2.4 g
  Filled 2019-03-26 (×9): qty 1

## 2019-03-26 MED ORDER — ASPIRIN 325 MG PO TABS
325.0000 mg | ORAL_TABLET | Freq: Every day | ORAL | Status: DC
  Administered 2019-03-27 – 2019-04-02 (×7): 325 mg
  Filled 2019-03-26 (×8): qty 1

## 2019-03-26 MED ORDER — DOXERCALCIFEROL 4 MCG/2ML IV SOLN
INTRAVENOUS | Status: AC
  Administered 2019-03-26: 12:00:00
  Filled 2019-03-26: qty 2

## 2019-03-26 MED ORDER — NEPRO/CARBSTEADY PO LIQD
1000.0000 mL | ORAL | Status: DC
  Administered 2019-03-26 – 2019-03-29 (×5): 1000 mL
  Filled 2019-03-26 (×6): qty 1000

## 2019-03-26 MED ORDER — METOPROLOL TARTRATE 25 MG/10 ML ORAL SUSPENSION
12.5000 mg | Freq: Two times a day (BID) | ORAL | Status: DC
  Administered 2019-03-26: 12.5 mg
  Filled 2019-03-26: qty 5

## 2019-03-26 MED ORDER — METOPROLOL TARTRATE 25 MG/10 ML ORAL SUSPENSION
12.5000 mg | Freq: Two times a day (BID) | ORAL | Status: DC
  Administered 2019-03-26 – 2019-04-01 (×11): 12.5 mg via ORAL
  Filled 2019-03-26 (×14): qty 5

## 2019-03-26 MED ORDER — CYCLOBENZAPRINE HCL 10 MG PO TABS: 10.0000 mg | ORAL_TABLET | Freq: Every day | ORAL | Status: DC | PRN

## 2019-03-26 NOTE — Plan of Care (Signed)
Continue to monitor

## 2019-03-26 NOTE — Progress Notes (Signed)
  Date: 03/26/2019  Patient name: Phyllis Hernandez  Medical record number: [id number]  Date of birth: Nov 24, 1950        I have seen and evaluated this patient and I have discussed the plan of care with the house staff. Please see their note for complete details. I concur with their findings with the following additions/corrections: [name] was seen this morning on team rounds while she was in hemodialysis.  Nephrology is working with the patient to ensure she can dialyze in a recliner once she is transferred to her facility.  Tube feeds have been started.  We will obtain COVID test in anticipation of transferring to SNF once a bed is available.  Bartholomew Crews, MD 03/26/2019, 6:12 PM

## 2019-03-26 NOTE — Progress Notes (Addendum)
Nutrition Follow-up  DOCUMENTATION CODES:   Obesity unspecified  INTERVENTION:   Start tube feeding:  -Nepro @ 20 ml/hr  -Increase by 10 ml Q6 hours to goal rate of 45 ml/hr (1080 ml) -30 ml Prostat BID  At goal TF provides: 2144 kcals, 117 grams protein, 785 ml free water. Meets 100% of needs.    NUTRITION DIAGNOSIS:   Inadequate oral intake related to lethargy/confusion, poor appetite as evidenced by meal completion < 50%, per patient/family report.  Ongoing  GOAL:   Patient will meet greater than or equal to 90% of their needs  Addressed via TF  MONITOR:   PO intake, Labs, Weight trends, Supplement acceptance, Skin  REASON FOR ASSESSMENT:   Consult Assessment of nutrition requirement/status, Poor PO  ASSESSMENT:   68 yo female admitted with HTN urgency, volume overload secondary to missing HD. Pt reports she had not been to HD for "months", Last documented HD 02/21/18. Initiated HD 12/2016 but was noncompliant with HD and was "discharged" from dialysis clinic. PMH includes ESRD/HD, DM, HLD, HTN   9/26- found to have acute CVA, NPO  Pt in HD at time of RD follow up. UF off during treatment due to hypotension. PEG placed in IR yesterday, clear to use today. Per primary, okay to start feeding. Will titrate to goal as pt has been without nutrition for prolonged period. SLP to see.   Unsure of EDW Admission weight: 101.3 kg  Current weight: 79.2 kg   Per nursing assessment, pt with +1 mild pitting generalized edema.   I/O: -14,868 ml since 9/22 Last HD today: 419 ml net UF No UOP documented   Medications: aranesp, hectorol, SS novolog, rena-vit, miralax, senokot, renvela Labs: CBG 111-155 Phosphorus 5.8- improving with renvela  Diet Order:   Diet Order            Diet NPO time specified Except for: Ice Chips  Diet effective now              EDUCATION NEEDS:   Not appropriate for education at this time  Skin:  Skin Assessment: Skin Integrity  Issues: Skin Integrity Issues:: Other (Comment) Other: biletaeral LE ulcerations (seen by WOC RN) from xerosis, partial thickness skin loss, dry wound bed  Last BM:  10/3  Height:   Ht Readings from Last 1 Encounters:  03/23/19 5\' 4"  (1.626 m)    Weight:   Wt Readings from Last 1 Encounters:  03/26/19 79.2 kg    Ideal Body Weight:  54.5 kg  BMI:  Body mass index is 29.97 kg/m.  Estimated Nutritional Needs:   Kcal:  2000-2200 kcals  Protein:  100-115 g  Fluid:  1000 mL plus UOP (Pt's UOP has been >500 mL daily)   Mariana Single RD, LDN Clinical Nutrition Pager # [phone]

## 2019-03-26 NOTE — Progress Notes (Signed)
Patient ID: Phyllis Hernandez, female   DOB: Nov 25, 1950, 68 y.o.   MRN: [id number]  IR Round note:  Pt had G tube placed in IR yesterday  I Spoke to RN No complaints overnight Afeb No pain    may use G tube now

## 2019-03-26 NOTE — Progress Notes (Signed)
Bilateral lower leg dressing change done by nursing students at this time per MD order.  Phyllis Hernandez

## 2019-03-26 NOTE — Progress Notes (Signed)
SLP Cancellation Note  Patient Details Name: LAMARA NISSEN MRN: [id number] DOB: [birth year]   Cancelled treatment:       Reason Eval/Treat Not Completed: Patient at procedure or test/unavailable (HD)   Venita Sheffield Rena Sweeden 03/26/2019, 8:54 AM  [name], M.A. [place] Acute Environmental education officer [phone] Office [phone]

## 2019-03-26 NOTE — Progress Notes (Signed)
Hemodialysis completed. Unable to meet uf goal due to hypotension and tachycardia during treatment.  Primary and Renal MD aware. All vitals stable post rinseback. Report called to primary RN.

## 2019-03-26 NOTE — Progress Notes (Addendum)
Patient ID: Phyllis Hernandez, female   DOB: [birth year], 68 y.o.   MRN: [id number]  [facility] Progress Note   Assessment/ Plan:    1.  Multiple bilateral acute cerebral infarcts:  Without evidence of SBE seen on TEE.  With consequent oropharyngeal dysphagia/dysphonia and plans noted for PEG tube placement by IR for nutritional support.  She needs assistance with transfers per PT/OT note.  She will need to be able to complete HD in a recliner to be acceptable for outpatient dialysis.   2. ESRD:   On a TTS schedule here for now. She had been on dialysis between 01/06/2017 and 03/09/2018 after which she self-discontinued therapy until contact during this hospitalization.  Overall poor prognosis with sequelae of multiple bilateral acute cerebral infarcts/dysphagia requiring PEG tube.   - HD today per TTS schedule LUE AVF  - needs to transition to HD in a recliner   3. Anemia chronic disease: With significant iron deficiency, continue intravenous iron supplementation/ESA.  CBC in AM  4. Secondary hyperparathyroidism, hyperphos: phosphorus level improving on Renvela.    5. Nutrition: oropharyngeal dysphagia; speech therapy is consulted  6.  Hypotension:  Off of amlodipine. Had been on Metoprolol per primary team.  May benefit from metoprolol again for rate control.  Paged primary team.   Blood cultures from 10/3 NGTD  Subjective:   Seen on HD.  Procedure supervised.  They were able to cannulate LUE AVF but HD unit could not locate a recliner so she had to dialyze in a bed.   UF is off for hypotension.  Tachy to 110's and up to 120 on my exam.  Review of systems:   Denies shortness of breath  Has been NPO  No n/v   Objective:   BP (!) 104/37   Pulse (!) 120   Temp 97.6 F (36.4 C) (Axillary)   Resp 20   Ht 5\' 4"  (1.626 m)   Wt 79.6 kg   SpO2 93%   BMI 30.12 kg/m   Physical Exam:  Gen: adult female in bed in NAD CVS: S1 and S2; no rub; tachycardia  Resp: Anteriorly  clear to auscultation, no rales or rhonchi.   Abd: Soft, obese, nontender Ext: no edema appreciated; bilateral legs in Ace wrapping/soft booties, chronic lymphedema/venous stasis changes Neuro - dysarthria but provides her name Access: right IJ tunneled dialysis catheter; LUE AVF in use    Labs: BMET Recent Labs  Lab 03/19/19 1306 03/21/19 0730 03/22/19 0825 03/23/19 0717 03/24/19 0757 03/25/19 0550 03/26/19 0236  NA 139 136 139 139 138 138 140  K 4.8 3.3* 3.9 3.7 4.3 4.4 4.8  CL 99 96* 101 100 99 98 99  CO2 24 23 21* 24 23 21* 21*  GLUCOSE 129* 100* 110* 124* 127* 133* 133*  BUN 35* 26* 20 30* 22 36* 50*  CREATININE 7.11* 5.49* 4.07* 5.71* 4.38* 6.10* 7.68*  CALCIUM 8.1* 8.2* 8.4* 8.5* 9.0 9.5 8.9  PHOS 7.0*  --   --  5.6*  --   --  5.8*   CBC Recent Labs  Lab 03/22/19 0825 03/23/19 0717 03/24/19 0757 03/25/19 0550  WBC 15.0* 16.7* 18.1* 17.7*  HGB 9.0* 7.7* 8.9* 8.6*  HCT 29.0* 26.0* 30.6* 28.3*  MCV 75.3* 75.8* 77.7* 76.1*  PLT 317 313 378 323   Medications:    . doxercalciferol      . aspirin  325 mg Oral Daily  . Chlorhexidine Gluconate Cloth  6 each Topical Q0600  .  Chlorhexidine Gluconate Cloth  6 each Topical Q0600  . Chlorhexidine Gluconate Cloth  6 each Topical Q0600  . darbepoetin (ARANESP) injection - DIALYSIS  100 mcg Intravenous Q Thu-HD  . doxercalciferol  2 mcg Intravenous Q T,Th,Sa-HD  . ezetimibe  10 mg Oral Daily  . feeding supplement (NEPRO CARB STEADY)  237 mL Oral BID BM  . heparin injection (subcutaneous)  5,000 Units Subcutaneous Q8H  . hydrocerin   Topical BID  . insulin aspart  0-5 Units Subcutaneous QHS  . insulin aspart  0-9 Units Subcutaneous TID WC  . mouth rinse  15 mL Mouth Rinse BID  . multivitamin  1 tablet Oral QHS  . polyethylene glycol  17 g Oral BID  . senna  1 tablet Oral BID  . sevelamer carbonate  2,400 mg Oral TID WC  . sodium chloride flush  3 mL Intravenous Q12H   Claudia Desanctis 03/26/2019, 9:26 AM

## 2019-03-26 NOTE — Progress Notes (Signed)
PT Cancellation Note  Patient Details Name: Phyllis Hernandez MRN: [id number] DOB: [birth year]   Cancelled Treatment:    Reason Eval/Treat Not Completed: Patient at procedure or test/unavailable (HD). Will follow-up as schedule permits.  Mabeline Caras, PT, DPT Acute Rehabilitation Services  Pager [phone] Office [place] 03/26/2019, 8:05 AM

## 2019-03-26 NOTE — Progress Notes (Addendum)
Subjective:   Mrs. Daws was seen in the dialysis unit this morning. She states that she is doing well and has no pain associated with the PEG tube placement.   Objective:  Vital signs in last 24 hours: Vitals:   03/26/19 1115 03/26/19 1123 03/26/19 1128 03/26/19 1215  BP: (!) 118/50 (!) 104/46 (!) 115/42 122/80  Pulse: (!) 114 (!) 123 84   Resp: 20 (!) 21 20 19   Temp:  98 F (36.7 C)  98.2 F (36.8 C)  TempSrc:  Axillary  Oral  SpO2:  96%    Weight:  79.2 kg    Height:       Physical Exam Constitutional:      General: She is not in acute distress.    Appearance: She is not ill-appearing or toxic-appearing.     Comments: Female lying comfortably in bed in no acute distress. Answers questions with head nods and shakes.   HENT:     Head: Normocephalic and atraumatic.  Cardiovascular:     Rate and Rhythm: Regular rhythm. Tachycardia present.     Pulses: Normal pulses.     Heart sounds: Normal heart sounds. No murmur. No friction rub. No gallop.   Pulmonary:     Effort: Pulmonary effort is normal.     Breath sounds: Normal breath sounds. No wheezing or rales.  Abdominal:     General: Bowel sounds are normal. There is distension.     Comments: PEG tube in place and sealed, no signs of drainage through bandages.   Neurological:     Mental Status: She is alert.     Assessment/Plan:  Principal Problem:   ESRD (end stage renal disease) ([place]) Active Problems:   Type 2 diabetes mellitus with complication, without long-term current use of insulin (HCC)   Anemia of chronic disease   Hypertensive urgency   Volume overload   Goals of care, counseling/discussion   Advance care planning   Palliative care encounter   CVA (cerebral vascular accident) ([place])  Acute CVA: Non verbal with difficulty swallowing  - MRI shows multiple scattered acute ischemic infarcts involving the bilateral cerebral hemispheres. Underlying age related cerebral atrophy with advanced chronic  microvascular ischemic disease.   - Palliative working with patient's family for goals of care. We appreciate their assistance.  - PEG ready for use.   - Dietician consulted for proper nutrition, we appreciate their assistance with Ms. Penniman's healthcare.   - Pharmacy consulted for medications for tube placement, we appreciate their help with Ms. Griggs's healthcare.   ESRD with missed HD for a year - Nephrology following, appreciate assistance - Continuing to tolerate HD sessions.   Hypotenison: - Blood pressures have continued to increase -Will monitor BP and mental status. -Holding BP medications. - Metoprolol 12.5 BID -If hypotensive, check mental status and check lactic acid. If concerning and suspicious for shock, will contact pccm  Bilateral LE wounds:  - XR: No evidence of osteo.  - MRI shows no osteomyelitis, abscess, or sinus tracks bilaterally -Continue Eucerin. - Continue changing wrappings as needed. - Continue Dilaudid 1-2 mg IV every dialysis session. - Continue Percocet/Roxicet Q4H PRN.  T2DM: BG stable with SSI - Nonadherent to home diabetes medications.  - Continue SSI  Leukocytosis:  - CXR 10/3 with no new finding, no evidence of PNA, but patient now has leokocytosis that is uptrending. She has no fever, evidence of infection, or medication cause like steroids.  - Blood cultures: NGTD  - F/u BC,  checking UA and UC (if can get urine sample) - Trend CBC - MRI shows no osteomyelitis, abscess, or sinus tracks bilaterally  Microcytic anemia:  - S/p 1 u prbc - Getting Ferrous gluconate during HD - Hb: at 8.6 - No bleeding reported. Will trend H and H - CBC daily  VTE ppx: Heparin (holding for procedure tomorrow) Code Status: FULL    Dispo: Anticipated discharge pending medical course.   Maudie Mercury, MD 03/26/2019, 3:02 PM Pager: [phone]

## 2019-03-26 NOTE — Progress Notes (Signed)
Hemodialysis initiated via L AVF using 17g needles x2 without issue. All labs and orders reviewed. Alert to self. Follows simple commands but is unable to answer questions appropriately.

## 2019-03-26 NOTE — Progress Notes (Addendum)
Patient back to room from dialysis at this time. V/s and assessment complete. Per PA, ok to use peg tube now. MD paged at this time to verify medication administration/orders.

## 2019-03-26 NOTE — Progress Notes (Signed)
Pt arrived to the unit via bed accompanied by 2 RNS. Tube feedings are infusing, pt nods to questions (pt is mute), respirations even and unlabored, no distress noted. Pt on tele box [id number].  Call light within reach, bed alarm on and bed in lowest position.   Paulla Fore, RN,BSN

## 2019-03-27 LAB — CBC
HCT: 30.5 % — ABNORMAL LOW (ref 36.0–46.0)
Hemoglobin: 9.3 g/dL — ABNORMAL LOW (ref 12.0–15.0)
MCH: 22.9 pg — ABNORMAL LOW (ref 26.0–34.0)
MCHC: 30.5 g/dL (ref 30.0–36.0)
MCV: 75.1 fL — ABNORMAL LOW (ref 80.0–100.0)
Platelets: 373 10*3/uL (ref 150–400)
RBC: 4.06 MIL/uL (ref 3.87–5.11)
RDW: 16.4 % — ABNORMAL HIGH (ref 11.5–15.5)
WBC: 19.4 10*3/uL — ABNORMAL HIGH (ref 4.0–10.5)
nRBC: 0.7 % — ABNORMAL HIGH (ref 0.0–0.2)

## 2019-03-27 LAB — BASIC METABOLIC PANEL
Anion gap: 16 — ABNORMAL HIGH (ref 5–15)
BUN: 38 mg/dL — ABNORMAL HIGH (ref 8–23)
CO2: 24 mmol/L (ref 22–32)
Calcium: 9.1 mg/dL (ref 8.9–10.3)
Chloride: 97 mmol/L — ABNORMAL LOW (ref 98–111)
Creatinine, Ser: 5.15 mg/dL — ABNORMAL HIGH (ref 0.44–1.00)
GFR calc Af Amer: 9 mL/min — ABNORMAL LOW (ref >60)
GFR calc non Af Amer: 8 mL/min — ABNORMAL LOW (ref >60)
Glucose, Bld: 229 mg/dL — ABNORMAL HIGH (ref 70–99)
Potassium: 4.5 mmol/L (ref 3.5–5.1)
Sodium: 137 mmol/L (ref 135–145)

## 2019-03-27 LAB — GLUCOSE, CAPILLARY
Glucose-Capillary: 213 mg/dL — ABNORMAL HIGH (ref 70–99)
Glucose-Capillary: 238 mg/dL — ABNORMAL HIGH (ref 70–99)
Glucose-Capillary: 272 mg/dL — ABNORMAL HIGH (ref 70–99)
Glucose-Capillary: 211 mg/dL — ABNORMAL HIGH (ref 70–99)

## 2019-03-27 MED ORDER — CHLORHEXIDINE GLUCONATE CLOTH 2 % EX PADS: 6.0000 | MEDICATED_PAD | Freq: Every day | CUTANEOUS | Status: DC

## 2019-03-27 NOTE — Progress Notes (Signed)
Occupational Therapy Treatment Patient Details Name: Phyllis Hernandez MRN: [id number] DOB: [birth year] Today's Date: 03/27/2019    History of present illness Pt is a 68 y.o. female admitted 03/08/19 with volume overload and hypertensive overload after missing HD sessions (per chart, has missed HD for approximately one year); pt opting to resume HD while admitted, but unable to tolerate complete session due to BLE pain. PMH includes ESRD (on HD), DM2, HTN, depression.   OT comments  Pt's husband present this session and tearful abut his wife's current medical condition. Pt awake, however followed commands inconsistently. Pt required max A rolling left and right in bed; unable to transition to sitting position. Pt non verbal but was abe to wash face and hands on command with min A hand over hand assist to initiate. OT assisted with UB hygiene and donning clean gown with total A at bed level. Pt's goals downgraded and continue to recommend SNF as d/c venue  Follow Up Recommendations  SNF;Supervision/Assistance - 24 hour    Equipment Recommendations  None recommended by OT    Recommendations for Other Services      Precautions / Restrictions Precautions Precautions: Fall;Other (comment) Precaution Comments: Bilateral feet/lower leg wounds wrapped in ace wrap, watch BP Required Braces or Orthoses: Other Brace Other Brace: una boot Restrictions Weight Bearing Restrictions: No       Mobility Bed Mobility Overal bed mobility: Needs Assistance Bed Mobility: Rolling Rolling: Max assist   Supine to sit: Total assist Sit to supine: Total assist   General bed mobility comments: unable to transition to supine, will need +2  Transfers                 General transfer comment: unable    Balance Overall balance assessment: Needs assistance Sitting-balance support: Bilateral upper extremity supported;Feet supported Sitting balance-Leahy Scale: Zero Sitting balance - Comments:  unable     Standing balance-Leahy Scale: Zero                             ADL either performed or assessed with clinical judgement   ADL Overall ADL's : Needs assistance/impaired     Grooming: Wash/dry hands;Wash/dry face;Minimal assistance Grooming Details (indicate cue type and reason): required verbal cues to initiate washing face supine in bed, min A HHA to initiate Upper Body Bathing: Total assistance       Upper Body Dressing : Total assistance                           Vision Patient Visual Report: No change from baseline     Perception     Praxis      Cognition Arousal/Alertness: Awake/alert Behavior During Therapy: Flat affect Overall Cognitive Status: Impaired/Different from baseline Area of Impairment: Attention;Following commands;Orientation                 Orientation Level: Disoriented to;Place;Time;Situation;Person Current Attention Level: Sustained   Following Commands: Follows one step commands inconsistently       General Comments: flat affec, slow to process. Not verbalizing at all during session; occasional nod of head to communicate when asked yes or no question.        Exercises     Shoulder Instructions       General Comments      Pertinent Vitals/ Pain       Pain Assessment: Faces Faces Pain Scale: Hurts little more Pain  Location: unable to name source of pain, more grimacing  and generalized with movement Pain Descriptors / Indicators: Grimacing  Home Living                                          Prior Functioning/Environment              Frequency  Min 2X/week        Progress Toward Goals  OT Goals(current goals can now be found in the care plan section)  Progress towards OT goals: Goals drowngraded-see care plan;Not progressing toward goals - comment  ADL Goals Pt Will Perform Grooming: with min assist;with min guard assist;sitting Pt Will Perform Lower Body  Bathing: with max assist;with mod assist;sitting/lateral leans Pt Will Perform Lower Body Dressing: with max assist;sitting/lateral leans Pt Will Transfer to Toilet: with max assist;stand pivot transfer;bedside commode Pt Will Perform Toileting - Clothing Manipulation and hygiene: with max assist;sit to/from stand  Plan Discharge plan remains appropriate;Frequency remains appropriate    Co-evaluation                 AM-PAC OT "6 Clicks" Daily Activity     Outcome Measure   Help from another person eating meals?: A Lot Help from another person taking care of personal grooming?: A Little Help from another person toileting, which includes using toliet, bedpan, or urinal?: Total Help from another person bathing (including washing, rinsing, drying)?: Total Help from another person to put on and taking off regular upper body clothing?: Total Help from another person to put on and taking off regular lower body clothing?: Total 6 Click Score: 9    End of Session    OT Visit Diagnosis: Other abnormalities of gait and mobility (R26.89);Muscle weakness (generalized) (M62.81);Pain;History of falling (Z91.81);Other symptoms and signs involving cognitive function Pain - Right/Left: (generalized with movement) Pain - part of body: (generalized)   Activity Tolerance Patient limited by fatigue;Other (comment)(cognition impaired)   Patient Left in bed;with call bell/phone within reach;with family/visitor present   Nurse Communication          Time: [id number] OT Time Calculation (min): 17 min  Charges: OT General Charges $OT Visit: 1 Visit OT Treatments $Therapeutic Activity: 8-22 mins     Britt Bottom 03/27/2019, 1:04 PM

## 2019-03-27 NOTE — TOC Progression Note (Signed)
Transition of Care [facility]) - Progression Note    Patient Details  Name: Phyllis Hernandez MRN: [id number] Date of Birth: [birth year]  Transition of Care [facility]) CM/SW Contact  Bartholomew Crews, RN Phone Number: [phone] 03/27/2019, 5:18 PM  Clinical Narrative:    Spoke with son, Legrand Como, on the phone to provide bed offers. He selected [facility]. Message left with liaison at [place]. Patient is for hemodialysis tomorrrow, and has outpatient seat at [place] 1:10pm. Covid negative 10/6. MD please advise if patient medically ready.  Will follow for transition of care needs.      Barriers to Discharge: Continued Medical Work up  Expected Discharge Plan and Services         Living arrangements for the past 2 months: Apartment                                       Social Determinants of Health (SDOH) Interventions    Readmission Risk Interventions No flowsheet data found.

## 2019-03-27 NOTE — Progress Notes (Signed)
  Speech Language Pathology Treatment: Dysphagia  Patient Details Name: Phyllis Hernandez MRN: [id number] DOB: 07/09/1950 Today's Date: 03/27/2019 Time: [id number] SLP Time Calculation (min) (ACUTE ONLY): 20 min  Assessment / Plan / Recommendation Clinical Impression  Pt is now s/p PEG. Set-up oral suctioning at bedside and provided oral care.  Pt alert, responding to name and followed intermittent commands to attend.  Provided with trials of ice chips and teaspoons of water.  Pt demonstrated improved spontaneous labial seal and initiation of swallow response with thin liquids; this was followed by intermittent coughing.  Ice chips were not manipulated orally despite max verbal/visual/tactile cues and were removed manually.  Pt did not initiate voicing despite max verbal/tactile cues.   Recommend continued dysphagia therapy.  Pt would also benefit from a speech/language evaluation given acute neurological findings.   HPI HPI: Bryttney August is a 68 yo female with a medical history of ESRD non-compliant with HD, HTN, HLD, T2DM and constipation who presented to the ED for evaluation of shortness of breath and fatigue.  Brain MRI on 9/26 identified "Multiple scattered acute ischemic infarcts involving the bilateral cerebral hemispheres." No prior known dysphagia hx.       SLP Plan  Continue with current plan of care       Recommendations  Diet recommendations: NPO Medication Administration: Via alternative means                Oral Care Recommendations: Oral care QID Follow up Recommendations: Skilled Nursing facility;24 hour supervision/assistance SLP Visit Diagnosis: Dysphagia, oral phase (R13.11) Plan: Continue with current plan of care       GO                Juan Quam Laurice 03/27/2019, 4:00 PM  Griselda Tosh L. Tivis Ringer, [place] Office number [phone] Pager [phone]

## 2019-03-27 NOTE — TOC Progression Note (Signed)
Transition of Care [facility]) - Progression Note    Patient Details  Name: Phyllis Hernandez MRN: [id number] Date of Birth: 02-Jul-1950  Transition [facility]) CM/SW Contact  Bartholomew Crews, RN Phone Number: [phone] 03/27/2019, 2:26 PM  Clinical Narrative:    Spoke with spouse, [name], at bedside to discuss transition of care. [name] is agreeable to rehab placement, but his ultimate desire is to have patient return home. Son, Legrand Como, is alternate contact person. Discussed process of sending patient information to SNFs in [place] and surrounding counties, and then bed offers would be provided for John to make decision. No current DME or [facility] services in the home. Following for transition of care needs.    Barriers to Discharge: Continued Medical Work up  Expected Discharge Plan and Services         Living arrangements for the past 2 months: Apartment                                       Social Determinants of Health (SDOH) Interventions    Readmission Risk Interventions No flowsheet data found.

## 2019-03-27 NOTE — Progress Notes (Signed)
Patient ID: Phyllis Hernandez, female   DOB: [birth year], 68 y.o.   MRN: [id number]  [facility] Progress Note   Assessment/ Plan:    1.  Multiple bilateral acute cerebral infarcts:  Without evidence of SBE seen on TEE.  With consequent oropharyngeal dysphagia/dysphonia and PEG for nutritional support.  She needs assistance with transfers per PT/OT note.  She will need to be able to complete HD in a recliner to be acceptable for outpatient dialysis.   2. ESRD:   On a TTS schedule here for now. She had been on dialysis between 01/06/2017 and 03/09/2018 after which she self-discontinued therapy until contact during this hospitalization.  Overall poor prognosis with sequelae of multiple bilateral acute cerebral infarcts/dysphagia requiring PEG tube.   - HD per TTS schedule LUE AVF  - needs to transition to HD in a recliner to be acceptable for outpatient dialysis    3. Anemia chronic disease: With significant iron deficiency; IV iron supplementation and ESA.    4. Secondary hyperparathyroidism, hyperphos: phosphorus level improving on Renvela.    5. Tachycardia - back on metoprolol; control per primary team   6. Leukocytosis - note rising leukocytosis.  Work-up per primary team. Blood cultures from 10/3 NGTD.   7. Nutrition: oropharyngeal dysphagia; speech therapy is consulted  8. HTN:  Off of amlodipine with hypotension.  Stable    Disposition - not able to discharged from a renal standpoint until she is able to perform HD in a recliner as this is a requirement for outpatient HD unit.  Requesting recliner for her treatment here on 10/8.  Note also rising leukocytosis.     Subjective:   Started on metoprolol on 10/6.  Tachycardic with last dialysis up to 120 and higher.  500 mL UF.  Rate appears overall improved.  They were able to cannulate LUE AVF on 10/6 but HD unit could not locate a recliner so she had to dialyze in a bed.  Spoke with her husband about her functional  status and he does still want to continue with dialysis but is willing to talk with other family members to get their opinion on overall course.    Review of systems:   She does not answer questions   Objective:   BP (!) 141/67 (BP Location: Right Arm)   Pulse 93   Temp 99 F (37.2 C) (Oral)   Resp 18   Ht 5\' 4"  (1.626 m)   Wt 78.2 kg   SpO2 93%   BMI 29.59 kg/m   Physical Exam:  Gen: adult female in bed in NAD  CVS: S1 and S2; no rub; tachycardia  Resp: Anteriorly clear to auscultation, no rales or rhonchi.   Abd: Soft, obese, nontender Ext: no edema appreciated; bilateral legs in Ace wrapping Neuro - tracks visually and intermittently nods but does not answer questions Access: right IJ tunneled dialysis catheter; LUE AVF   Labs: BMET Recent Labs  Lab 03/21/19 0730 03/22/19 0825 03/23/19 0717 03/24/19 0757 03/25/19 0550 03/26/19 0236 03/27/19 0341  NA 136 139 139 138 138 140 137  K 3.3* 3.9 3.7 4.3 4.4 4.8 4.5  CL 96* 101 100 99 98 99 97*  CO2 23 21* 24 23 21* 21* 24  GLUCOSE 100* 110* 124* 127* 133* 133* 229*  BUN 26* 20 30* 22 36* 50* 38*  CREATININE 5.49* 4.07* 5.71* 4.38* 6.10* 7.68* 5.15*  CALCIUM 8.2* 8.4* 8.5* 9.0 9.5 8.9 9.1  PHOS  --   --  5.6*  --   --  5.8*  --    CBC Recent Labs  Lab 03/23/19 0717 03/24/19 0757 03/25/19 0550 03/27/19 0341  WBC 16.7* 18.1* 17.7* 19.4*  HGB 7.7* 8.9* 8.6* 9.3*  HCT 26.0* 30.6* 28.3* 30.5*  MCV 75.8* 77.7* 76.1* 75.1*  PLT 313 378 323 373   Medications:    . aspirin  325 mg Per Tube Daily  . Chlorhexidine Gluconate Cloth  6 each Topical Q0600  . darbepoetin (ARANESP) injection - DIALYSIS  100 mcg Intravenous Q Thu-HD  . doxercalciferol  2 mcg Intravenous Q T,Th,Sa-HD  . ezetimibe  10 mg Per Tube Daily  . feeding supplement (NEPRO CARB STEADY)  1,000 mL Per Tube Q24H  . feeding supplement (PRO-STAT SUGAR FREE 64)  30 mL Per Tube BID  . heparin injection (subcutaneous)  5,000 Units Subcutaneous Q8H  .  hydrocerin   Topical BID  . insulin aspart  0-5 Units Subcutaneous QHS  . insulin aspart  0-9 Units Subcutaneous TID WC  . mouth rinse  15 mL Mouth Rinse BID  . metoprolol tartrate  12.5 mg Oral BID  . multivitamin  1 tablet Oral QHS  . polyethylene glycol  17 g Oral BID  . sennosides  5 mL Per Tube BID  . sevelamer carbonate  2.4 g Per Tube TID WC  . sodium chloride flush  3 mL Intravenous Q12H   Claudia Desanctis 03/27/2019, 5:58 PM

## 2019-03-27 NOTE — NC FL2 (Addendum)
[place] LEVEL OF CARE SCREENING TOOL     IDENTIFICATION  Patient Name: Phyllis Hernandez Birthdate: 10-17-1950 Sex: female Admission Date (Current Location): 03/08/2019  [place] and [place] Number:  Phyllis Hernandez [id number] [place] and Address:  [facility] [place] [address], [place], [place] [address]      Provider Number: [id number]  Attending Physician Name and Address:  Oda Kilts, MD  Relative Name and Phone Number:  Arayia Chhabra, son, [phone]  Nassim Klepper [phone]  Current Level of Care: SNF Recommended Level of Care: [place] Prior Approval Number:    Date Approved/Denied:   PASRR Number: [id number]  Discharge Plan: SNF    Current Diagnoses: Patient Active Problem List   Diagnosis Date Noted  . CVA (cerebral vascular accident) ([place]) 03/18/2019  . Goals of care, counseling/discussion   . Advance care planning   . Palliative care encounter   . Hypertensive urgency 03/08/2019  . Volume overload 03/08/2019  . Closed displaced fracture of lateral malleolus of right fibula 02/09/2017  . ESRD (end stage renal disease) ([place]) 12/27/2016  . Elevated troponin 12/27/2016  . Anemia of chronic disease 12/27/2016  . Thrombocytopenia ([place]) 12/27/2016  . Constipation 12/27/2016  . Hypertensive emergency 12/09/2016  . Acute on chronic renal failure ([place]) 12/09/2016  . Type 2 diabetes mellitus with complication, without long-term current use of insulin ([place]) 02/27/2007  . Hyperlipidemia 02/27/2007  . Essential hypertension 02/27/2007    Orientation RESPIRATION BLADDER Height & Weight     Self  Normal Incontinent Weight: 78.2 kg Height:  5\' 4"  (162.6 cm)  BEHAVIORAL SYMPTOMS/MOOD NEUROLOGICAL BOWEL NUTRITION STATUS      Incontinent Feeding tube  AMBULATORY STATUS COMMUNICATION OF NEEDS Skin   Total Care Does not communicate Bruising, Other (Comment)(venous stasis ulcer-R leg)                        Personal Care Assistance Level of Assistance  Total care Bathing Assistance: Maximum assistance Feeding assistance: Maximum assistance Dressing Assistance: Maximum assistance Total Care Assistance: Maximum assistance   Functional Limitations Info  Sight, Hearing Sight Info: Adequate Hearing Info: Adequate Speech Info: Impaired    SPECIAL CARE FACTORS FREQUENCY  PT (By licensed PT), OT (By licensed OT), Speech therapy     PT Frequency: 5x week OT Frequency: 5x week            Contractures Contractures Info: Not present    Additional Factors Info  Allergies, Code Status, Insulin Sliding Scale Code Status Info: Full code Allergies Info: No Known Allergies   Insulin Sliding Scale Info: insulin aspart (novoLOG) injection 0-5 Units daily at bedtime; insulin aspart (novoLOG) injection 0-9 Units daily 3x with meals       Current Medications (03/27/2019):  This is the current hospital active medication list Current Facility-Administered Medications  Medication Dose Route Frequency Provider Last Rate Last Dose  . acetaminophen (TYLENOL) solution 650 mg  650 mg Per Tube Q6H PRN Oda Kilts, MD      . aspirin tablet 325 mg  325 mg Per Tube Daily Oda Kilts, MD   325 mg at 03/27/19 0959  . Chlorhexidine Gluconate Cloth 2 % PADS 6 each  6 each Topical Q0600 Claudia Desanctis, MD   6 each at 03/27/19 [phone]  . cyclobenzaprine (FLEXERIL) tablet 10 mg  10 mg Per Tube Daily PRN Oda Kilts, MD      . Darbepoetin Alfa Kyra Searles)  injection 100 mcg  100 mcg Intravenous Q Thu-HD Oda Kilts, MD   100 mcg at 03/21/19 1041  . doxercalciferol (HECTOROL) injection 2 mcg  2 mcg Intravenous Q T,Th,Sa-HD Elmarie Shiley, MD   2 mcg at 03/23/19 1038  . ezetimibe (ZETIA) tablet 10 mg  10 mg Per Tube Daily Oda Kilts, MD   10 mg at 03/27/19 0959  . feeding supplement (NEPRO CARB STEADY) liquid 1,000 mL  1,000 mL Per Tube Q24H Maudie Mercury, MD 45 mL/hr at 03/27/19  1220 1,000 mL at 03/27/19 1220  . feeding supplement (PRO-STAT SUGAR FREE 64) liquid 30 mL  30 mL Per Tube BID Maudie Mercury, MD   30 mL at 03/27/19 0959  . ferric gluconate (NULECIT) 125 mg in sodium chloride 0.9 % 100 mL IVPB  125 mg Intravenous Q T,Th,Sa-HD Elmarie Shiley, MD 110 mL/hr at 03/23/19 1028 125 mg at 03/23/19 1028  . heparin injection 5,000 Units  5,000 Units Subcutaneous Q8H Oda Kilts, MD   5,000 Units at 03/27/19 [id number]  . hydrocerin (EUCERIN) cream   Topical BID Skeet Latch, MD      . insulin aspart (novoLOG) injection 0-5 Units  0-5 Units Subcutaneous QHS Skeet Latch, MD      . insulin aspart (novoLOG) injection 0-9 Units  0-9 Units Subcutaneous TID WC Skeet Latch, MD   3 Units at 03/27/19 1221  . MEDLINE mouth rinse  15 mL Mouth Rinse BID Skeet Latch, MD   15 mL at 03/27/19 1000  . metoprolol tartrate (LOPRESSOR) 25 mg/10 mL oral suspension 12.5 mg  12.5 mg Oral BID Maudie Mercury, MD   12.5 mg at 03/27/19 0958  . multivitamin (RENA-VIT) tablet 1 tablet  1 tablet Oral QHS Skeet Latch, MD   1 tablet at 03/27/19 0008  . oxyCODONE-acetaminophen (PERCOCET/ROXICET) 5-325 MG per tablet 1-2 tablet  1-2 tablet Per Tube Q4H PRN Oda Kilts, MD      . polyethylene glycol (MIRALAX / GLYCOLAX) packet 17 g  17 g Oral BID Skeet Latch, MD   17 g at 03/27/19 0959  . sennosides (SENOKOT) 8.8 MG/5ML syrup 5 mL  5 mL Per Tube BID Oda Kilts, MD   5 mL at 03/27/19 0958  . sevelamer carbonate (RENVELA) powder PACK 2.4 g  2.4 g Per Tube TID WC Oda Kilts, MD   2.4 g at 03/27/19 1222  . sodium chloride flush (NS) 0.9 % injection 3 mL  3 mL Intravenous Q12H Skeet Latch, MD   3 mL at 03/27/19 [id number]     Discharge Medications: Please see discharge summary for a list of discharge medications.  Relevant Imaging Results:  Relevant Lab Results:   Additional Information SSN [id number]; hemodialysis TTS [facility]  Bartholomew Crews, RN

## 2019-03-27 NOTE — Progress Notes (Signed)
  Date: 03/27/2019  Patient name: Phyllis Hernandez  Medical record number: [id number]  Date of birth: Nov 14, 1950        I have seen and evaluated this patient and I have discussed the plan of care with the house staff. Please see their note for complete details. I concur with their findings with the following additions/corrections: Ms. Ludwig was seen this morning on team rounds.  She is medically stable to be transferred to SNF when a bed is available.  Bartholomew Crews, MD 03/27/2019, 5:30 PM

## 2019-03-27 NOTE — Progress Notes (Signed)
Subjective:   Mrs. Newitt was seen today at bedside. She was not as participative today as her previous interviews. We spoke to her about SNF placement, and her pending COVID result. We also spoke with her husband and gave updates about her current situation. All questions and concerns were addressed.   Objective:  Vital signs in last 24 hours: Vitals:   03/26/19 1900 03/26/19 2101 03/27/19 0415 03/27/19 0935  BP: (!) 115/56 (!) 141/41 (!) 133/97 140/88  Pulse: 98 91 89 90  Resp: (!) 22 14 16 18   Temp: 98.2 F (36.8 C) 98.6 F (37 C) 98.8 F (37.1 C) 98.6 F (37 C)  TempSrc: Oral Oral Oral Oral  SpO2: 92% 97% 93% 95%  Weight:  78.2 kg    Height:       Physical Exam Vitals signs and nursing note reviewed.  Constitutional:      General: She is not in acute distress.    Appearance: She is ill-appearing. She is not toxic-appearing or diaphoretic.     Comments: Female laying in bed in no acute distress. She is not participating by shaking or nodding today.   HENT:     Head: Normocephalic and atraumatic.  Cardiovascular:     Rate and Rhythm: Normal rate and regular rhythm.     Pulses: Normal pulses.     Heart sounds: Normal heart sounds. No murmur. No friction rub. No gallop.   Pulmonary:     Effort: Pulmonary effort is normal.     Breath sounds: Normal breath sounds. No wheezing, rhonchi or rales.  Abdominal:     General: Abdomen is flat. Bowel sounds are normal.     Tenderness: There is no abdominal tenderness.     Comments: PEG Tube in place with no drainage of nutrient, purulence, or serosanguinous fluid.      Assessment/Plan:  Principal Problem:   ESRD (end stage renal disease) ([place]) Active Problems:   Type 2 diabetes mellitus with complication, without long-term current use of insulin (HCC)   Anemia of chronic disease   Hypertensive urgency   Volume overload   Goals of care, counseling/discussion   Advance care planning   Palliative care encounter   CVA  (cerebral vascular accident) ([place])  Acute CVA: Non verbal with difficulty swallowing  - MRI shows multiple scattered acute ischemic infarcts involving the bilateral cerebral hemispheres. Underlying age related cerebral atrophy with advanced chronic microvascular ischemic disease.   - Palliative working with patient's family for goals of care. We appreciate their assistance.  - PEG currently in use.   ESRD with missed HD for a year - Nephrology following, appreciate assistance - Continuing to tolerate HD sessions.   Hypotenison: - Blood pressures have continued to increase - Will monitor BP and mental status. - Metoprolol 12.5 BID - If hypotensive, check mental status and check lactic acid. If concerning and suspicious for shock, will contact pccm.  Bilateral LE wounds:  - XR: No evidence of osteo.  - MRI shows no osteomyelitis, abscess, or sinus tracks bilaterally -Continue Eucerin. - Continue changing wrappings as needed. - Continue Dilaudid 1-2 mg IV every dialysis session. - Continue Percocet/Roxicet Q4H PRN.  T2DM: BG stable with SSI - Continue SSI  Leukocytosis:  - WBC: 19.4 - CXR 10/3 with no new finding, no evidence of PNA, but patient now has leokocytosis that is uptrending. She has no fever, evidence of infection, or medication cause like steroids.  - Blood cultures: NGTD  - F/u BC, checking  UA and UC (if can get urine sample) - Trend CBC - MRI shows no osteomyelitis, abscess, or sinus tracks bilaterally  Microcytic anemia:  - S/p 1 u prbc - Getting Ferrous gluconate during HD - Hb: at 9.3 - No bleeding reported. Will trend H and H - CBC daily  VTE ppx: Heparin Code Status: FULL    Dispo: Anticipated discharge pending medical course.   Maudie Mercury, MD 03/27/2019, 1:42 PM Pager: [phone]

## 2019-03-27 NOTE — Progress Notes (Addendum)
Physical Therapy Treatment Patient Details Name: Phyllis Hernandez MRN: [id number] DOB: [birth year] Today's Date: 03/27/2019    History of Present Illness Pt is a 68 y.o. female admitted 03/08/19 with volume overload and hypertensive overload after missing HD sessions (per chart, has missed HD for approximately one year); pt opting to resume HD while admitted, but unable to tolerate complete session due to BLE pain. PMH includes ESRD (on HD), DM2, HTN, depression.    PT Comments    Pt continues to be  Limited by lethargy and difficulty following commands this session. Pt requiring increased time in order to initiate movements such as bringing legs off bed. Pt able to tolerate sitting EOB this session x 6 minutes working on sitting balance and trunk control, continues to require total assist for bed mobility and min-mod assist for sitting balance. Pt would continue to benefit from SNF level follow up therapy in order to address deficits in strength, balance, and functional mobility and to maximize functional independence.   Follow Up Recommendations  SNF;Supervision/Assistance - 24 hour     Equipment Recommendations  Other (comment)(tbd next venue)    Recommendations for Other Services       Precautions / Restrictions Precautions Precautions: Fall;Other (comment) Precaution Comments: Bilateral feet/lower leg wounds wrapped in ace wrap, watch BP Required Braces or Orthoses: Other Brace Other Brace: una boot Restrictions Weight Bearing Restrictions: No    Mobility  Bed Mobility Overal bed mobility: Needs Assistance Bed Mobility: Rolling;Supine to Sit Rolling: Max assist   Supine to sit: Total assist Sit to supine: Total assist   General bed mobility comments: multimodal cues for hand placement and sequencing of rolling, pt transferred supine<>sitting EOB this session with total assist for LE mnagement and trunk control  Transfers                        Wheelchair  Mobility    Modified Rankin (Stroke Patients Only)       Balance Overall balance assessment: Needs assistance Sitting-balance support: Bilateral upper extremity supported;Feet supported Sitting balance-Leahy Scale: Zero Sitting balance - Comments: Worked on sitting balance EOB this session with min-mod assist to correct posterior and R lateral lean, pt uses L UE for support in sitting and requires facilitation for L UE use and placement. While sitting EOB worked on trunk and head control, cues and facilitation for increased trunk extension.     Standing balance-Leahy Scale: Zero                              Cognition Arousal/Alertness: Lethargic Behavior During Therapy: Flat affect Overall Cognitive Status: Difficult to assess Area of Impairment: Attention;Following commands;Orientation                   Current Attention Level: Sustained   Following Commands: Follows one step commands inconsistently       General Comments: flat affec, slow to process. Not verbalizing at all during session other than moaning 2/2 pain. Gave occasional nod of head to communicate when asked yes or no question.        PT Goals (current goals can now be found in the care plan section) Progress towards PT goals: Progressing toward goals    Frequency    Min 2X/week      PT Plan Current plan remains appropriate    Co-evaluation  AM-PAC PT "6 Clicks" Mobility   Outcome Measure  Help needed turning from your back to your side while in a flat bed without using bedrails?: Total Help needed moving from lying on your back to sitting on the side of a flat bed without using bedrails?: Total Help needed moving to and from a bed to a chair (including a wheelchair)?: Total Help needed standing up from a chair using your arms (e.g., wheelchair or bedside chair)?: Total Help needed to walk in hospital room?: Total Help needed climbing 3-5 steps with a railing?  : Total 6 Click Score: 6    End of Session   Activity Tolerance: Patient limited by lethargy;Patient limited by fatigue Patient left: in bed;with bed alarm set;with call bell/phone within reach Nurse Communication: Mobility status PT Visit Diagnosis: Other abnormalities of gait and mobility (R26.89);Muscle weakness (generalized) (M62.81);History of falling (Z91.81)     Time: [id number] PT Time Calculation (min) (ACUTE ONLY): 16 min  Charges:  $Therapeutic Activity: 8-22 mins                     Netta Corrigan, PT, DPT, CSRS Acute Rehab Office [place] 03/27/2019, 10:06 AM

## 2019-03-27 NOTE — Plan of Care (Signed)

## 2019-03-28 ENCOUNTER — Inpatient Hospital Stay (HOSPITAL_COMMUNITY): Payer: Medicare Other

## 2019-03-28 LAB — CBC WITH DIFFERENTIAL/PLATELET
Abs Immature Granulocytes: 0.91 10*3/uL — ABNORMAL HIGH (ref 0.00–0.07)
Basophils Absolute: 0.1 10*3/uL (ref 0.0–0.1)
Basophils Relative: 1 %
Eosinophils Absolute: 0 10*3/uL (ref 0.0–0.5)
Eosinophils Relative: 0 %
HCT: 30.7 % — ABNORMAL LOW (ref 36.0–46.0)
Hemoglobin: 9.3 g/dL — ABNORMAL LOW (ref 12.0–15.0)
Immature Granulocytes: 4 %
Lymphocytes Relative: 5 %
Lymphs Abs: 1.1 10*3/uL (ref 0.7–4.0)
MCH: 22.9 pg — ABNORMAL LOW (ref 26.0–34.0)
MCHC: 30.3 g/dL (ref 30.0–36.0)
MCV: 75.4 fL — ABNORMAL LOW (ref 80.0–100.0)
Monocytes Absolute: 1.6 10*3/uL — ABNORMAL HIGH (ref 0.1–1.0)
Monocytes Relative: 7 %
Neutro Abs: 19.6 10*3/uL — ABNORMAL HIGH (ref 1.7–7.7)
Neutrophils Relative %: 83 %
Platelets: 273 10*3/uL (ref 150–400)
RBC: 4.07 MIL/uL (ref 3.87–5.11)
RDW: 16.9 % — ABNORMAL HIGH (ref 11.5–15.5)
WBC: 23.3 10*3/uL — ABNORMAL HIGH (ref 4.0–10.5)
nRBC: 0.6 % — ABNORMAL HIGH (ref 0.0–0.2)

## 2019-03-28 LAB — BASIC METABOLIC PANEL
Anion gap: 18 — ABNORMAL HIGH (ref 5–15)
BUN: 70 mg/dL — ABNORMAL HIGH (ref 8–23)
CO2: 26 mmol/L (ref 22–32)
Calcium: 9 mg/dL (ref 8.9–10.3)
Chloride: 93 mmol/L — ABNORMAL LOW (ref 98–111)
Creatinine, Ser: 6.8 mg/dL — ABNORMAL HIGH (ref 0.44–1.00)
GFR calc Af Amer: 7 mL/min — ABNORMAL LOW (ref >60)
GFR calc non Af Amer: 6 mL/min — ABNORMAL LOW (ref >60)
Glucose, Bld: 257 mg/dL — ABNORMAL HIGH (ref 70–99)
Potassium: 4.8 mmol/L (ref 3.5–5.1)
Sodium: 137 mmol/L (ref 135–145)

## 2019-03-28 LAB — CULTURE, BLOOD (ROUTINE X 2)
Culture: NO GROWTH
Culture: NO GROWTH
Special Requests: ADEQUATE

## 2019-03-28 LAB — SAVE SMEAR(SSMR), FOR PROVIDER SLIDE REVIEW

## 2019-03-28 LAB — GLUCOSE, CAPILLARY
Glucose-Capillary: 210 mg/dL — ABNORMAL HIGH (ref 70–99)
Glucose-Capillary: 215 mg/dL — ABNORMAL HIGH (ref 70–99)
Glucose-Capillary: 242 mg/dL — ABNORMAL HIGH (ref 70–99)
Glucose-Capillary: 243 mg/dL — ABNORMAL HIGH (ref 70–99)
Glucose-Capillary: 251 mg/dL — ABNORMAL HIGH (ref 70–99)
Glucose-Capillary: 315 mg/dL — ABNORMAL HIGH (ref 70–99)

## 2019-03-28 LAB — PHOSPHORUS: Phosphorus: 4.3 mg/dL (ref 2.5–4.6)

## 2019-03-28 MED ORDER — DOXERCALCIFEROL 4 MCG/2ML IV SOLN
INTRAVENOUS | Status: AC
  Filled 2019-03-28: qty 2

## 2019-03-28 MED ORDER — HEPARIN SODIUM (PORCINE) 1000 UNIT/ML IJ SOLN
INTRAMUSCULAR | Status: AC
  Administered 2019-03-28: 4000 [IU]
  Filled 2019-03-28: qty 4

## 2019-03-28 MED ORDER — VANCOMYCIN HCL IN DEXTROSE 750-5 MG/150ML-% IV SOLN: 750.0000 mg | INTRAVENOUS | Status: DC

## 2019-03-28 MED ORDER — VANCOMYCIN HCL 10 G IV SOLR
1750.0000 mg | Freq: Once | INTRAVENOUS | Status: AC
  Administered 2019-03-28: 1750 mg via INTRAVENOUS
  Filled 2019-03-28: qty 1750

## 2019-03-28 MED ORDER — VANCOMYCIN HCL 10 G IV SOLR
1500.0000 mg | Freq: Once | INTRAVENOUS | Status: DC
  Filled 2019-03-28: qty 1500

## 2019-03-28 MED ORDER — INSULIN GLARGINE 100 UNIT/ML ~~LOC~~ SOLN
15.0000 [IU] | Freq: Every day | SUBCUTANEOUS | Status: DC
  Administered 2019-03-28 – 2019-03-31 (×4): 15 [IU] via SUBCUTANEOUS
  Filled 2019-03-28 (×5): qty 0.15

## 2019-03-28 MED ORDER — SODIUM CHLORIDE 0.9 % IV SOLN
2.0000 g | INTRAVENOUS | Status: DC
  Administered 2019-03-28 – 2019-03-30 (×2): 2 g via INTRAVENOUS
  Filled 2019-03-28 (×2): qty 2

## 2019-03-28 MED ORDER — DARBEPOETIN ALFA 100 MCG/0.5ML IJ SOSY
PREFILLED_SYRINGE | INTRAMUSCULAR | Status: AC
  Filled 2019-03-28: qty 0.5

## 2019-03-28 NOTE — Progress Notes (Signed)
MD on call made aware of SS orders. Pt is NPO receiving TF. Will check CBG every 4 hours. No new order.

## 2019-03-28 NOTE — Progress Notes (Signed)
Inpatient Diabetes Program Recommendations  AACE/ADA: New Consensus Statement on Inpatient Glycemic Control (2015)  Target Ranges:  Prepandial:   less than 140 mg/dL      Peak postprandial:   less than 180 mg/dL (1-2 hours)      Critically ill patients:  140 - 180 mg/dL   Lab Results  Component Value Date   GLUCAP 251 (H) 03/28/2019   HGBA1C 6.3 (H) 03/16/2019    Review of Glycemic Control Results for BRIANICA, ASEL (MRN [id number]) as of 03/28/2019 10:11  Ref. Range 03/27/2019 04:15 03/27/2019 11:14 03/27/2019 16:34 03/27/2019 20:35 03/28/2019 00:02 03/28/2019 04:29 03/28/2019 07:57  Glucose-Capillary Latest Ref Range: 70 - 99 mg/dL 213 (H) 238 (H) 272 (H) 211 (H) 242 (H) 243 (H) 251 (H)   Diabetes history: DM 2 Outpatient Diabetes medications: Novolog 3 units tid meal coverage, Levemir 10 units qpm Current orders for Inpatient glycemic control:  Novolog 0-9 units tid + hs  Inpatient Diabetes Program Recommendations:    Pt on Tube Feeds 45 ml/hour. Pt takes meal coverage insulin at home. Glucose consistently in the 200's.  Please place pt on Novolog Q4 hour Correction scale in addition to Novolog 2-3 units Q4 hours Tube Feed Coverage  (Do not give if tube feeds are stopped or held for any reason)  Thanks,  Tama Headings RN, MSN, BC-ADM Inpatient Diabetes Coordinator Team Pager [phone] (8a-5p)

## 2019-03-28 NOTE — Progress Notes (Signed)
Contacted by nurse regarding fever of 102.7. Went to evaluate patient at bedside. Nurse reports patient had her O2 saturations drop during dialysis which prompted her getting oxygen. Patient continues to be very lethargic which appears to be stable. She did occasionally open her eyes however was non-verbal. When asked about pain she did slightly shake her head no however did move her left hand to her stomach.  On exam she was lethargic appearing, did wiggle fingers but otherwise was not following commands, cardiac exam unremarkable, lung exam difficult to assess however did not appreciate any wheezing or rhonchi, abdomen was soft, no grimace with palpation, normoactive bowel sounds, with PEG tube in place with no signs of erythema or warmth surrounding the area.   Patient had a leukocytosis yesterday of 19.4 and 23.3 today. Chest x-ray on 10/3 showed no acute findings or evidence of pneumonia. Blood cultures drawn on 10/3 showed no growth to date x5 days. Patient does have a evidence of bilateral lower extremity wounds but MRI LE showed no evidence of osteomyelitis, abscess, or sinus tracks. Given the leukocytosis and new fever there is concern about infection. Given her lethargy, hypoxia, and dysphagia (requiring PEG tube placement), there is a possibility of aspiration pneumonia, however patient has remained NPO.   -CXR -Start vanc/cefepime -Blood cultures -Urinalysis with culture

## 2019-03-28 NOTE — Progress Notes (Addendum)
Patient ID: Phyllis Hernandez, female   DOB: 1950-07-28, 68 y.o.   MRN: [id number]  [facility] Progress Note   Assessment/ Plan:    1.  Multiple bilateral acute cerebral infarcts:  Without evidence of SBE seen on TEE.  With consequent oropharyngeal dysphagia/dysphonia and PEG for nutritional support.  She needs assistance with transfers per PT/OT note.  She will need to be able to complete HD in a recliner to be acceptable for outpatient dialysis.  2. ESRD:   On a TTS schedule here for now. She had been on dialysis between 01/06/2017 and 03/09/2018 after which she self-discontinued therapy until contact during this hospitalization.  Overall poor prognosis with sequelae of multiple bilateral acute cerebral infarcts/dysphagia requiring PEG tube.   - HD per TTS schedule LUE AVF  - needs to transition to HD in a recliner to be acceptable for outpatient dialysis   3. Anemia chronic disease: With significant iron deficiency; IV iron supplementation and ESA.   4. Secondary hyperparathyroidism, hyperphos: phosphorus level improving on Renvela.    5. Tachycardia - back on metoprolol; control per primary team    6. Leukocytosis - note rising leukocytosis.  Work-up per primary team. Blood cultures from 10/3 NGTD.     7. Nutrition: oropharyngeal dysphagia; speech therapy is consulted  8. HTN:  Off of amlodipine with hypotension.  Stable    Disposition - not able to discharged from a renal standpoint until she is able to perform HD in a recliner as this is a requirement for outpatient HD unit.  Note also rising leukocytosis.  Paged primary team     Subjective:   Seen and examined on HD and procedure supervised.  BP 114/63 on HD and HR initially 97.  Later with tachycardia on HD today up to 150.  UF goal decreased.  Paged primary team.  They are titrating beta blocker.  Cannulated 1:1 tunneled catheter and fistula today.  She is in a recliner and had to use a lift to get into the chair.    Review of systems:     She does not answer questions/nonverbal and does not nod today   Objective:   BP (!) 108/36 (BP Location: Right Arm)   Pulse 93   Temp 97.9 F (36.6 C)   Resp 15   Ht 5\' 4"  (1.626 m)   Wt 78.1 kg   SpO2 93%   BMI 29.55 kg/m   Physical Exam:  Gen: adult female in bed in NAD   CVS: S1 and S2; no rub; tachycardia  Resp: Anteriorly clear to auscultation, no rales or rhonchi.   Abd: Soft, obese, nontender Ext: no edema appreciated; bilateral legs in Ace wrapping Neuro - tracks visually and intermittently nods but does not answer questions Access: right IJ tunneled dialysis catheter; LUE AVF   Labs: BMET Recent Labs  Lab 03/22/19 0825 03/23/19 0717 03/24/19 0757 03/25/19 0550 03/26/19 0236 03/27/19 0341 03/28/19 0426 03/28/19 0656  NA 139 139 138 138 140 137 137  --   K 3.9 3.7 4.3 4.4 4.8 4.5 4.8  --   CL 101 100 99 98 99 97* 93*  --   CO2 21* 24 23 21* 21* 24 26  --   GLUCOSE 110* 124* 127* 133* 133* 229* 257*  --   BUN 20 30* 22 36* 50* 38* 70*  --   CREATININE 4.07* 5.71* 4.38* 6.10* 7.68* 5.15* 6.80*  --   CALCIUM 8.4* 8.5* 9.0 9.5 8.9 9.1 9.0  --  PHOS  --  5.6*  --   --  5.8*  --   --  4.3   CBC Recent Labs  Lab 03/23/19 0717 03/24/19 0757 03/25/19 0550 03/27/19 0341  WBC 16.7* 18.1* 17.7* 19.4*  HGB 7.7* 8.9* 8.6* 9.3*  HCT 26.0* 30.6* 28.3* 30.5*  MCV 75.8* 77.7* 76.1* 75.1*  PLT 313 378 323 373   Medications:    . aspirin  325 mg Per Tube Daily  . Chlorhexidine Gluconate Cloth  6 each Topical Q0600  . darbepoetin (ARANESP) injection - DIALYSIS  100 mcg Intravenous Q Thu-HD  . doxercalciferol  2 mcg Intravenous Q T,Th,Sa-HD  . ezetimibe  10 mg Per Tube Daily  . feeding supplement (NEPRO CARB STEADY)  1,000 mL Per Tube Q24H  . feeding supplement (PRO-STAT SUGAR FREE 64)  30 mL Per Tube BID  . heparin injection (subcutaneous)  5,000 Units Subcutaneous Q8H  . hydrocerin   Topical BID  . insulin aspart  0-5 Units  Subcutaneous QHS  . insulin aspart  0-9 Units Subcutaneous TID WC  . mouth rinse  15 mL Mouth Rinse BID  . metoprolol tartrate  12.5 mg Oral BID  . multivitamin  1 tablet Oral QHS  . polyethylene glycol  17 g Oral BID  . sennosides  5 mL Per Tube BID  . sevelamer carbonate  2.4 g Per Tube TID WC  . sodium chloride flush  3 mL Intravenous Q12H   Claudia Desanctis 03/28/2019, 9:41 AM

## 2019-03-28 NOTE — Progress Notes (Addendum)
Pharmacy Antibiotic Note  Phyllis Hernandez is a 68 y.o. female admitted on 03/08/2019 with weakness, SOB, hypertensive urgency after missing several HD sessions.  WBC is trending up (now 23.3), temp is 102.7 F this evening and pt appears lethargic. Pt had O2 saturation drop during HD today, for which she rec'd oxygen. CXR on 10/3 showed no acute findings or evidence of pneumonia, and bld cx from 10/3 are NGTD. Pt has evidence of bilateral lower extremity wounds, without evidence of osteo or abscess on MRI.  Pharmacy has been consulted for vancomycin and cefepime dosing for sepsis.  Medical history includes: ESRD on HD (Tuesday, Thursday, Saturday), DM 2, anemia of chronic disease, CVA, hypertensive urgency   Plan: Cefepime 2 gm IV on Tuesday, Thursday, Saturday AFTER HD - first dose tonight Vancomycin 1750 mg IV X 1 (tonight), followed by vancomycin 750 mg IV on Tuesday, Thursday, Saturday at the end of each HD session Monitor WBC, temp, clinical improvement, cultures, vancomycin levels  Height: 5' 4'' (162.6 cm) Weight: 78.1 kg IBW/kg (Calculated): 54.7 kg  Temp (24hrs), Avg:98.8 F (37.1 C), Min:97.7 F (36.5 C), Max:102.7 F (39.3 C)  Recent Labs  Lab 03/23/19 0717 03/24/19 0757 03/25/19 0550 03/26/19 0236 03/27/19 0341 03/28/19 0426 03/28/19 1503  WBC 16.7* 18.1* 17.7*  --  19.4*  --  23.3*  CREATININE 5.71* 4.38* 6.10* 7.68* 5.15* 6.80*  --     Estimated Creatinine Clearance: 8.3 mL/min (A) (by C-G formula based on SCr of 6.8 mg/dL (H)).    Allergies  Allergen Reactions  . No Known Allergies     Antimicrobials this admission: 9/23 Zosyn >> 9/26 9/23 Vancomycin >> 9/26  Microbiology results: 9/18 BCx: NG/final 9/23 BCx X 2: NG/final 10/3 BCx X 2: NG/final 9/18 COVID: negative  Thank you for allowing pharmacy to be a part of this patient's care.  Gillermina Hu, PharmD, BCPS, [facility] Clinical Pharmacist 03/28/2019 9:16 PM

## 2019-03-28 NOTE — Progress Notes (Addendum)
Subjective:  Phyllis Hernandez was seen at bedside this morning. She was lethargic this afternoon, after her dialysis session. She was unable to participate in the interview.   Objective:  Vital signs in last 24 hours: Vitals:   03/28/19 1200 03/28/19 1220 03/28/19 1225 03/28/19 1407  BP: 130/83 (!) 116/52 (!) 118/58 (!) 103/44  Pulse: (!) 117 (!) 108 (!) 109 62  Resp: 20 (!) 21 20 16   Temp:  98 F (36.7 C)  98.5 F (36.9 C)  TempSrc:  Oral  Oral  SpO2: 97% 95%  98%  Weight:      Height:       Physical Exam Vitals signs reviewed.  Constitutional:      General: She is not in acute distress.    Appearance: She is obese. She is not ill-appearing or toxic-appearing.     Comments: Lethargic and responds to verbal stimuli.   HENT:     Head: Normocephalic and atraumatic.  Cardiovascular:     Rate and Rhythm: Normal rate and regular rhythm.     Pulses: Normal pulses.     Heart sounds: Normal heart sounds. No murmur. No gallop.   Pulmonary:     Effort: Pulmonary effort is normal.     Breath sounds: Normal breath sounds. No wheezing, rhonchi or rales.  Abdominal:     General: Abdomen is flat. Bowel sounds are normal.     Tenderness: There is no abdominal tenderness.     Comments: PEG tube in place with no signs of nutrient, purulent, or serosanguinous drainage.      Assessment/Plan:  Principal Problem:   ESRD (end stage renal disease) ([place]) Active Problems:   Type 2 diabetes mellitus with complication, without long-term current use of insulin (HCC)   Anemia of chronic disease   Hypertensive urgency   Volume overload   Goals of care, counseling/discussion   Advance care planning   Palliative care encounter   CVA (cerebral vascular accident) ([place])  Acute CVA: Non verbal with difficulty swallowing  - MRI shows multiple scattered acute ischemic infarcts involving the bilateral cerebral hemispheres. Underlying age related cerebral atrophy with advanced chronic microvascular  ischemic disease.   - Palliative working with patient's family for goals of care. We appreciate their assistance.  - PEG currently in use.   ESRD with missed HD for a year - Nephrology following, appreciate assistance - Continuing to tolerate HD sessions.   Hypotenison: - Blood pressures have continued to increase - Will monitor BP and mental status. - Metoprolol 12.5 BID - If hypotensive, check mental status and check lactic acid. If concerning and suspicious for shock, will contact pccm.  Bilateral LE wounds:  - XR: No evidence of osteo.  - MRI shows no osteomyelitis, abscess, or sinus tracks bilaterally -Continue Eucerin. - Continue changing wrappings as needed. - Continue Dilaudid 1-2 mg IV every dialysis session. - Continue Percocet/Roxicet Q4H PRN.  T2DM: BG stable with SSI - Patient's glucose has been climbing since her PEG tube placement. - Will start 15U Lantus QHS.  Leukocytosis:  - WBC: 19.4 (03/27/2019) - CXR 10/3 with no new finding, no evidence of PNA, but patient now has leokocytosis that is uptrending. She has no fever, evidence of infection, or medication cause like steroids.  - Blood cultures: NGTD  - Trend CBC - MRI shows no osteomyelitis, abscess, or sinus tracks bilaterally  Microcytic anemia:  - Getting Ferrous gluconate during HD - Hb: at 9.3 (03/27/2019). - No bleeding reported. Will trend  H and H - CBC daily  VTE ppx: Heparin Code Status: FULL    Dispo: Medically stable and ready for discharge SNF.   Maudie Mercury, MD 03/28/2019, 3:20 PM Pager: [phone]

## 2019-03-28 NOTE — Progress Notes (Signed)
  Date: 03/28/2019  Patient name: Phyllis Hernandez  Medical record number: [id number]  Date of birth: Jun 22, 1950        I have seen and evaluated this patient and I have discussed the plan of care with the house staff. Please see their note for complete details. I concur with their findings with the following additions/corrections: Ms. Cressey was seen this afternoon with the team.  I had spoken to the nephrology team this morning about their concerns.  We reviewed her tachycardia which occurred when we had to hold her metoprolol on the third for hypertension.  She got 1 dose of metoprolol on the sixth and resumed twice daily dosing on the sixth and her heart rate has been controlled since the sixth except for today during hemodialysis.  We will slowly titrate up her metoprolol but need to do so slowly due to recent history of hypotension.  Her leukocytosis is up trending.  Her blood cultures on September 18, September 23, and October 3 have all been negative.  Her UA on September 23 was negative.  Her chest x-ray on October 3 was negative.  Her CT abdomen on October 1 was negative.  Her MRI on October 5 did not show osteo-.  Her seven-day T-max was 99.9 and that occurred on October 5.  We will ask wound care to reevaluate her lower extremity wounds but I see no other intervention needed without a localizing source.  Nephrology is working to ensure she can dialyze in a chair and once that occurs, assuming no change, she is stable to be discharged to a SNF.  Bartholomew Crews, MD 03/28/2019, 3:42 PM

## 2019-03-28 NOTE — Progress Notes (Signed)
Patient in recliner for HD today. Renal Navigator updated CM/W. Phyllis Hernandez of patient's tachycardia on HD today up to 150 (Navigator present on HD unit and discussed with Nephrologist/Dr. Royce Macadamia) and, therefore, not ready for discharge today. Renal Navigator will continue to follow.  Alphonzo Cruise, [place] Renal Navigator [phone]

## 2019-03-29 LAB — CBC
HCT: 26.9 % — ABNORMAL LOW (ref 36.0–46.0)
Hemoglobin: 8 g/dL — ABNORMAL LOW (ref 12.0–15.0)
MCH: 22.8 pg — ABNORMAL LOW (ref 26.0–34.0)
MCHC: 29.7 g/dL — ABNORMAL LOW (ref 30.0–36.0)
MCV: 76.6 fL — ABNORMAL LOW (ref 80.0–100.0)
Platelets: 234 10*3/uL (ref 150–400)
RBC: 3.51 MIL/uL — ABNORMAL LOW (ref 3.87–5.11)
RDW: 16.9 % — ABNORMAL HIGH (ref 11.5–15.5)
WBC: 21.4 10*3/uL — ABNORMAL HIGH (ref 4.0–10.5)
nRBC: 0.5 % — ABNORMAL HIGH (ref 0.0–0.2)

## 2019-03-29 LAB — BASIC METABOLIC PANEL
Anion gap: 18 — ABNORMAL HIGH (ref 5–15)
BUN: 49 mg/dL — ABNORMAL HIGH (ref 8–23)
CO2: 23 mmol/L (ref 22–32)
Calcium: 9 mg/dL (ref 8.9–10.3)
Chloride: 93 mmol/L — ABNORMAL LOW (ref 98–111)
Creatinine, Ser: 4.9 mg/dL — ABNORMAL HIGH (ref 0.44–1.00)
GFR calc Af Amer: 10 mL/min — ABNORMAL LOW (ref >60)
GFR calc non Af Amer: 9 mL/min — ABNORMAL LOW (ref >60)
Glucose, Bld: 441 mg/dL — ABNORMAL HIGH (ref 70–99)
Potassium: 4.3 mmol/L (ref 3.5–5.1)
Sodium: 134 mmol/L — ABNORMAL LOW (ref 135–145)

## 2019-03-29 LAB — GLUCOSE, CAPILLARY
Glucose-Capillary: 401 mg/dL — ABNORMAL HIGH (ref 70–99)
Glucose-Capillary: 402 mg/dL — ABNORMAL HIGH (ref 70–99)
Glucose-Capillary: 370 mg/dL — ABNORMAL HIGH (ref 70–99)
Glucose-Capillary: 299 mg/dL — ABNORMAL HIGH (ref 70–99)
Glucose-Capillary: 322 mg/dL — ABNORMAL HIGH (ref 70–99)

## 2019-03-29 LAB — MRSA PCR SCREENING: MRSA by PCR: NEGATIVE

## 2019-03-29 MED ORDER — INSULIN ASPART 100 UNIT/ML ~~LOC~~ SOLN
0.0000 [IU] | SUBCUTANEOUS | Status: DC
  Administered 2019-03-29: 15 [IU] via SUBCUTANEOUS
  Administered 2019-03-29 (×2): 8 [IU] via SUBCUTANEOUS
  Administered 2019-03-29: 15 [IU] via SUBCUTANEOUS
  Administered 2019-03-30 (×2): 8 [IU] via SUBCUTANEOUS
  Administered 2019-03-30: 3 [IU] via SUBCUTANEOUS
  Administered 2019-03-30: 5 [IU] via SUBCUTANEOUS
  Administered 2019-03-30: 8 [IU] via SUBCUTANEOUS
  Administered 2019-03-31 (×6): 5 [IU] via SUBCUTANEOUS
  Administered 2019-04-01 (×2): 8 [IU] via SUBCUTANEOUS
  Administered 2019-04-01: 5 [IU] via SUBCUTANEOUS
  Administered 2019-04-01 (×2): 8 [IU] via SUBCUTANEOUS
  Administered 2019-04-01: 5 [IU] via SUBCUTANEOUS
  Administered 2019-04-02 (×2): 8 [IU] via SUBCUTANEOUS

## 2019-03-29 MED ORDER — VANCOMYCIN HCL IN DEXTROSE 1-5 GM/200ML-% IV SOLN
1000.0000 mg | INTRAVENOUS | Status: DC
  Administered 2019-03-30: 10:00:00 1000 mg via INTRAVENOUS
  Filled 2019-03-29: qty 200

## 2019-03-29 MED ORDER — INSULIN ASPART 100 UNIT/ML ~~LOC~~ SOLN: 0.0000 [IU] | Freq: Three times a day (TID) | SUBCUTANEOUS | Status: DC

## 2019-03-29 MED ORDER — CHLORHEXIDINE GLUCONATE CLOTH 2 % EX PADS
6.0000 | MEDICATED_PAD | Freq: Every day | CUTANEOUS | Status: DC
  Administered 2019-03-30 – 2019-04-02 (×4): 6 via TOPICAL

## 2019-03-29 NOTE — Progress Notes (Addendum)
Inpatient Diabetes Program Recommendations  AACE/ADA: New Consensus Statement on Inpatient Glycemic Control (2015)  Target Ranges:  Prepandial:   less than 140 mg/dL      Peak postprandial:   less than 180 mg/dL (1-2 hours)      Critically ill patients:  140 - 180 mg/dL   Lab Results  Component Value Date   GLUCAP 402 (H) 03/29/2019   HGBA1C 6.3 (H) 03/16/2019    Review of Glycemic Control Results for Phyllis Hernandez, Phyllis Hernandez (MRN [id number]) as of 03/28/2019 10:11  Ref. Range 03/27/2019 04:15 03/27/2019 11:14 03/27/2019 16:34 03/27/2019 20:35 03/28/2019 00:02 03/28/2019 04:29 03/28/2019 07:57  Glucose-Capillary Latest Ref Range: 70 - 99 mg/dL 213 (H) 238 (H) 272 (H) 211 (H) 242 (H) 243 (H) 251 (H)  Results for Phyllis Hernandez, Phyllis Hernandez (MRN [id number]) as of 03/29/2019 09:55  Ref. Range 03/28/2019 04:29 03/28/2019 07:57 03/28/2019 14:07 03/28/2019 16:57 03/28/2019 20:25 03/29/2019 03:58 03/29/2019 07:56  Glucose-Capillary Latest Ref Range: 70 - 99 mg/dL 243 (H) 251 (H) 215 (H) 210 (H) 315 (H) 401 (H) 402 (H)   Diabetes history: DM 2 Outpatient Diabetes medications: Novolog 3 units tid meal coverage, Levemir 10 units qpm Current orders for Inpatient glycemic control:  Novolog 0-9 units tid + hs  Inpatient Diabetes Program Recommendations:    Pt on Tube Feeds 45 ml/hour. Pt takes meal coverage insulin at home. Glucose consistently in the 200's increased into the 400's.  Noted Lantus 15 units qhs started yesterday.    Please place pt on Novolog Q4 hour Correction scale in addition to Novolog 3 units Q4 hours Tube Feed Coverage  (Do not give if tube feeds are stopped or held for any reason)  Thanks,  Tama Headings RN, MSN, BC-ADM Inpatient Diabetes Coordinator Team Pager [phone] (8a-5p)

## 2019-03-29 NOTE — TOC Progression Note (Signed)
Transition of Care [facility]) - Progression Note    Patient Details  Name: Phyllis Hernandez MRN: [id number] Date of Birth: February 15, 1951  [facility]) CM/SW Contact  Bartholomew Crews, RN Phone Number: [phone] 03/29/2019, 2:55 PM  Clinical Narrative:    Received notification that patient no longer appropriate for outpatient hemodialysis. Notified [facility] of change in condition - patient cannot go to SNF unless able to go to outpatient hemodialysis. Reached out to [facility] for possible appropriateness - no dialysis beds at the moment but will follow for clinical appropriateness as they may have dialysis bed open next week. [facility] declined bed offer at this time. Noted palliative consult. Following for transition of care needs.    Barriers to Discharge: Continued Medical Work up  Expected Discharge Plan and Services         Living arrangements for the past 2 months: Apartment                                       Social Determinants of Health (SDOH) Interventions    Readmission Risk Interventions No flowsheet data found.

## 2019-03-29 NOTE — Progress Notes (Signed)
Patient ID: Phyllis Hernandez, female   DOB: 1950/11/19, 68 y.o.   MRN: [id number]  [facility] Progress Note   Assessment/ Plan:    1.  Multiple bilateral acute cerebral infarcts:    With consequent oropharyngeal dysphagia/dysphonia and PEG for nutritional support.  She needs assistance with transfers per PT/OT note- seems hoyer dep.  Essentially unresponsive - needs assist with all-  I do not think she is a candidate for OP dialysis at all in this state.  I would not put her in an OP unit in this state.  She is not even really a dialysis candidate- need to get palliative care involved.  If cannot convince family that this is no quality of life to be preserved I think the only option is LTAC  2. ESRD:   On a TTS schedule here for now. Had been on dialysis between 01/06/2017 and 03/09/2018 after which she self-discontinued therapy until contact during this hospitalization.  Overall poor prognosis with sequelae of multiple bilateral acute cerebral infarcts/dysphagia requiring PEG tube.   - HD per TTS schedule here LUE AVF - see above.  Will order dialysis for tomorrow and until disposition is made   3. Anemia chronic disease: With significant iron deficiency; IV iron supplementation and ESA.   4. Secondary hyperparathyroidism, hyperphos: phosphorus level improving on Renvela.    5. Tachycardia - back on metoprolol; control per primary team    6. Leukocytosis - note rising leukocytosis.  Work-up per primary team. Blood cultures from 10/3 NGTD.  Stable to decreased today    7. Nutrition: oropharyngeal dysphagia; speech therapy is consulted  8. HTN:  Off of amlodipine- on metoprolol.  Stable      Subjective:      Objective:   BP (!) 113/47 (BP Location: Right Wrist)   Pulse 88   Temp 99.2 F (37.3 C) (Oral)   Resp 16   Ht 5\' 4"  (1.626 m)   Wt 81.1 kg   SpO2 100%   BMI 30.69 kg/m   Physical Exam:  Gen: adult female in bed in NAD - stares, non verbal, cannot follow  commands   CVS: S1 and S2; no rub; tachycardia  Resp: Anteriorly clear to auscultation, no rales or rhonchi.   Abd: Soft, obese, nontender Ext: no edema appreciated; bilateral legs in Ace wrapping- ischemic legs  Access: right IJ tunneled dialysis catheter; LUE AVF   Labs: BMET Recent Labs  Lab 03/23/19 0717 03/24/19 0757 03/25/19 0550 03/26/19 0236 03/27/19 0341 03/28/19 0426 03/28/19 0656 03/29/19 0630  NA 139 138 138 140 137 137  --  134*  K 3.7 4.3 4.4 4.8 4.5 4.8  --  4.3  CL 100 99 98 99 97* 93*  --  93*  CO2 24 23 21* 21* 24 26  --  23  GLUCOSE 124* 127* 133* 133* 229* 257*  --  441*  BUN 30* 22 36* 50* 38* 70*  --  49*  CREATININE 5.71* 4.38* 6.10* 7.68* 5.15* 6.80*  --  4.90*  CALCIUM 8.5* 9.0 9.5 8.9 9.1 9.0  --  9.0  PHOS 5.6*  --   --  5.8*  --   --  4.3  --    CBC Recent Labs  Lab 03/25/19 0550 03/27/19 0341 03/28/19 1503 03/29/19 0630  WBC 17.7* 19.4* 23.3* 21.4*  NEUTROABS  --   --  19.6*  --   HGB 8.6* 9.3* 9.3* 8.0*  HCT 28.3* 30.5* 30.7* 26.9*  MCV 76.1*  75.1* 75.4* 76.6*  PLT 323 373 273 234   Medications:    . aspirin  325 mg Per Tube Daily  . Chlorhexidine Gluconate Cloth  6 each Topical Q0600  . darbepoetin (ARANESP) injection - DIALYSIS  100 mcg Intravenous Q Thu-HD  . doxercalciferol  2 mcg Intravenous Q T,Th,Sa-HD  . ezetimibe  10 mg Per Tube Daily  . feeding supplement (NEPRO CARB STEADY)  1,000 mL Per Tube Q24H  . feeding supplement (PRO-STAT SUGAR FREE 64)  30 mL Per Tube BID  . heparin injection (subcutaneous)  5,000 Units Subcutaneous Q8H  . hydrocerin   Topical BID  . insulin aspart  0-15 Units Subcutaneous Q4H  . insulin glargine  15 Units Subcutaneous QHS  . mouth rinse  15 mL Mouth Rinse BID  . metoprolol tartrate  12.5 mg Oral BID  . multivitamin  1 tablet Oral QHS  . polyethylene glycol  17 g Oral BID  . sennosides  5 mL Per Tube BID  . sevelamer carbonate  2.4 g Per Tube TID WC  . sodium chloride flush  3 mL  Intravenous Q12H   Corey Caulfield A Basir Niven 03/29/2019, 11:09 AM

## 2019-03-29 NOTE — Progress Notes (Signed)
PT Cancellation Note  Patient Details Name: Phyllis Hernandez MRN: [id number] DOB: 11/02/1950   Cancelled Treatment:    Reason Eval/Treat Not Completed: Fatigue/lethargy limiting ability to participate.  Pt was not able to move with PT and so will try again at another time with pt and PT ability permitting.     Ramond Dial 03/29/2019, 3:03 PM   Mee Hives, PT MS Acute Rehab Dept. Number: [place] and [place]

## 2019-03-29 NOTE — Progress Notes (Signed)
Renal Navigator discussed patient's case with Nephrologist/Dr. Chancy Milroy her note from 03/29/19. Renal Navigator spoke to Palliative Care NP/K. Mahan to request that someone from PMT follow up with patient and family as soon as possible regarding decision that patient is no longer appropriate for OP HD treatment. Renal Navigator spoke with CM/W. Estelle Grumbles who is evaluating whether patient is a candidate for LTAC.  Alphonzo Cruise, [place] Renal Navigator [phone]

## 2019-03-29 NOTE — Progress Notes (Signed)
  Date: 03/29/2019  Patient name: Phyllis Hernandez  Medical record number: [id number]  Date of birth: 12/14/1950        I have seen and evaluated this patient and I have discussed the plan of care with the house staff. Please see their note for complete details. I concur with their findings with the following additions/corrections: [name] was seen this morning on team rounds.  Her husband was at the bedside.  She is tolerating tube feedings but spiked a temperature of 102.7 last night.  Vanc and Zosyn were started but we still do not have a source.  Nephrology has now determined that she has not an outpatient hemodialysis candidate.  The family has 2 options which would be see if she is a candidate for LTAC or hospice.  Inpatient hospice would likely be more feasible than home hospice.  Palliative care is working at the family for a final decision.  Bartholomew Crews, MD 03/29/2019, 2:47 PM

## 2019-03-29 NOTE — Progress Notes (Addendum)
Subjective:  Phyllis Hernandez was seen at bedside this morning. She was lethargic and unable to participate during the interview. We spoke with her husband and we explained her current status and continual workup for her infection. He stated that he understood.   We spoke with Nephrology today and they have stated that due to Phyllis Hernandez's current state, and observation from yesterday's sitting dialysis, she is not a candidate for outpatient dialysis, but they will continue inpatient dialysis during her hospitilization. They have reached out to palliative and social work to speak with the family about Phyllis Hernandez's goals of care. We appreciate nephrology's help with Phyllis Hernandez's healthcare.   Objective:  Vital signs in last 24 hours: Vitals:   03/28/19 2202 03/29/19 0107 03/29/19 0403 03/29/19 0921  BP: (!) 108/57 (!) 120/57 (!) 103/49 (!) 113/47  Pulse: 94 88 86 88  Resp: (!) 24 20 20 16   Temp: (!) 101 F (38.3 C) 98.5 F (36.9 C) 99.4 F (37.4 C) 99.2 F (37.3 C)  TempSrc: Oral Oral Oral Oral  SpO2: 100% 100% 92% 100%  Weight:   81.1 kg   Height:       Physical Exam Vitals signs and nursing note reviewed.  Constitutional:      Appearance: She is obese. She is ill-appearing.     Comments: Lethargic female, laying in bed, unresponsive to verbal stimuli, opens eyes to physical stimuli.   HENT:     Head: Normocephalic and atraumatic.  Cardiovascular:     Rate and Rhythm: Normal rate and regular rhythm.     Pulses: Normal pulses.     Heart sounds: Normal heart sounds. No murmur. No friction rub. No gallop.   Pulmonary:     Effort: Pulmonary effort is normal.     Breath sounds: Normal breath sounds. No wheezing, rhonchi or rales.  Abdominal:     General: Bowel sounds are normal.     Palpations: Abdomen is soft.     Tenderness: There is no abdominal tenderness. There is no guarding.     Comments: Lifted bandages to check for inflammation or signs of infection. Wound appeared clean  with no erythema or discharge.    Musculoskeletal:     Right lower leg: No edema.     Left lower leg: No edema.  Neurological:     Mental Status: She is disoriented.     Assessment/Plan:  Principal Problem:   ESRD (end stage renal disease) ([place]) Active Problems:   Type 2 diabetes mellitus with complication, without long-term current use of insulin (HCC)   Anemia of chronic disease   Hypertensive urgency   Volume overload   Goals of care, counseling/discussion   Advance care planning   Palliative care encounter   CVA (cerebral vascular accident) ([place])  Acute CVA: Non verbal with difficulty swallowing  - MRI shows multiple scattered acute ischemic infarcts involving the bilateral cerebral hemispheres. Underlying age related cerebral atrophy with advanced chronic microvascular ischemic disease.   - Palliative working with patient's family for goals of care. We appreciate their assistance.  - PEG currently in use.   ESRD with missed HD for a year - Nephrology following, appreciate assistance. - We spoke with nephrology and it was determined that Phyllis Hernandez is not a candidate for outpatient dialysis due to her current condition. They have spoken with palliative care to discuss next goal of care with the family.  - Continuing to tolerate HD sessions.   Hypotenison: - Blood pressures 97/79-158/58 -  Will continue to monitor BP. - Continue  Metoprolol 12.5 BID - If hypotensive, check mental status and check lactic acid. If concerning and suspicious for shock, will contact pccm.  Bilateral LE wounds:  - XR: No evidence of osteo.  - MRI shows no osteomyelitis, abscess, or sinus tracks bilaterally -Continue Eucerin. - Continue changing wrappings as needed. - Continue Dilaudid 1-2 mg IV every dialysis session. - Continue Percocet/Roxicet Q4H PRN. - Ordered follow up with Wound Care for reassessment.   T2DM: BG stable with SSI - Patient's glucose has been climbing since her  PEG tube placement. - Continue 15U Lantus QHS with moderate SSI Q4H.  Leukocytosis with Fever:  - On 03/28/19 residents were paged with a new fever of 102.7. They ordered a chest xray, which showed mild cardiomegaly with pulmonary vascular congestion and mild interstitial edema, urinalysis, blood cultures, and started vancomycin and cefepime.  - WBC: 21.4 - Trend CBC - MRI of bilateral lower extremities  shows no osteomyelitis, abscess, or sinus tracks bilaterally.  Microcytic anemia:  - Getting Ferrous gluconate during HD - Hb: 8.0 - No bleeding reported. Will trend H and H - CBC daily  VTE ppx: Heparin Code Status: FULL    Dispo: Discharge pending medical course and goal of care terms with family.   Phyllis Mercury, MD 03/29/2019, 1:25 PM Pager: [phone]

## 2019-03-30 DIAGNOSIS — L97909 Non-pressure chronic ulcer of unspecified part of unspecified lower leg with unspecified severity: Secondary | ICD-10-CM

## 2019-03-30 DIAGNOSIS — D72829 Elevated white blood cell count, unspecified: Secondary | ICD-10-CM

## 2019-03-30 DIAGNOSIS — E11622 Type 2 diabetes mellitus with other skin ulcer: Secondary | ICD-10-CM

## 2019-03-30 DIAGNOSIS — L03119 Cellulitis of unspecified part of limb: Secondary | ICD-10-CM

## 2019-03-30 LAB — CBC
HCT: 23.5 % — ABNORMAL LOW (ref 36.0–46.0)
Hemoglobin: 7.1 g/dL — ABNORMAL LOW (ref 12.0–15.0)
MCH: 23.3 pg — ABNORMAL LOW (ref 26.0–34.0)
MCHC: 30.2 g/dL (ref 30.0–36.0)
MCV: 77 fL — ABNORMAL LOW (ref 80.0–100.0)
Platelets: 230 10*3/uL (ref 150–400)
RBC: 3.05 MIL/uL — ABNORMAL LOW (ref 3.87–5.11)
RDW: 16.9 % — ABNORMAL HIGH (ref 11.5–15.5)
WBC: 22.6 10*3/uL — ABNORMAL HIGH (ref 4.0–10.5)
nRBC: 0.3 % — ABNORMAL HIGH (ref 0.0–0.2)

## 2019-03-30 LAB — GLUCOSE, CAPILLARY
Glucose-Capillary: 186 mg/dL — ABNORMAL HIGH (ref 70–99)
Glucose-Capillary: 224 mg/dL — ABNORMAL HIGH (ref 70–99)
Glucose-Capillary: 241 mg/dL — ABNORMAL HIGH (ref 70–99)
Glucose-Capillary: 256 mg/dL — ABNORMAL HIGH (ref 70–99)
Glucose-Capillary: 268 mg/dL — ABNORMAL HIGH (ref 70–99)
Glucose-Capillary: 276 mg/dL — ABNORMAL HIGH (ref 70–99)

## 2019-03-30 LAB — RENAL FUNCTION PANEL
Albumin: 1.5 g/dL — ABNORMAL LOW (ref 3.5–5.0)
Anion gap: 14 (ref 5–15)
BUN: 78 mg/dL — ABNORMAL HIGH (ref 8–23)
CO2: 28 mmol/L (ref 22–32)
Calcium: 9 mg/dL (ref 8.9–10.3)
Chloride: 91 mmol/L — ABNORMAL LOW (ref 98–111)
Creatinine, Ser: 6.17 mg/dL — ABNORMAL HIGH (ref 0.44–1.00)
GFR calc Af Amer: 8 mL/min — ABNORMAL LOW (ref >60)
GFR calc non Af Amer: 7 mL/min — ABNORMAL LOW (ref >60)
Glucose, Bld: 267 mg/dL — ABNORMAL HIGH (ref 70–99)
Phosphorus: 2.3 mg/dL — ABNORMAL LOW (ref 2.5–4.6)
Potassium: 4 mmol/L (ref 3.5–5.1)
Sodium: 133 mmol/L — ABNORMAL LOW (ref 135–145)

## 2019-03-30 MED ORDER — NEPRO/CARBSTEADY PO LIQD
1000.0000 mL | ORAL | Status: DC
  Administered 2019-03-30 – 2019-04-01 (×2): 1000 mL
  Filled 2019-03-30 (×9): qty 1000

## 2019-03-30 MED ORDER — OXYCODONE-ACETAMINOPHEN 5-325 MG PO TABS
ORAL_TABLET | ORAL | Status: AC
  Administered 2019-03-30: 1
  Filled 2019-03-30: qty 1

## 2019-03-30 MED ORDER — HEPARIN SODIUM (PORCINE) 1000 UNIT/ML IJ SOLN
INTRAMUSCULAR | Status: AC
  Administered 2019-03-30: 3400 [IU] via INTRAVENOUS_CENTRAL
  Filled 2019-03-30: qty 4

## 2019-03-30 MED ORDER — HEPARIN SODIUM (PORCINE) 1000 UNIT/ML IJ SOLN
INTRAMUSCULAR | Status: AC
  Filled 2019-03-30: qty 2

## 2019-03-30 MED ORDER — DOXERCALCIFEROL 4 MCG/2ML IV SOLN
INTRAVENOUS | Status: AC
  Administered 2019-03-30: 2 ug via INTRAVENOUS
  Filled 2019-03-30: qty 2

## 2019-03-30 MED ORDER — HEPARIN SODIUM (PORCINE) 1000 UNIT/ML DIALYSIS
20.0000 [IU]/kg | INTRAMUSCULAR | Status: DC | PRN
  Administered 2019-03-30: 10:00:00 3400 [IU] via INTRAVENOUS_CENTRAL
  Administered 2019-03-30: 1600 [IU] via INTRAVENOUS_CENTRAL
  Filled 2019-03-30 (×2): qty 1.6

## 2019-03-30 MED ORDER — VANCOMYCIN HCL IN DEXTROSE 1-5 GM/200ML-% IV SOLN
INTRAVENOUS | Status: AC
  Administered 2019-03-30: 1000 mg via INTRAVENOUS
  Filled 2019-03-30: qty 200

## 2019-03-30 NOTE — Plan of Care (Signed)
  Problem: Activity: Goal: Risk for activity intolerance will decrease Outcome: Progressing   Problem: Nutrition: Goal: Adequate nutrition will be maintained Outcome: Progressing   Problem: Clinical Measurements: Goal: Complications related to the disease process, condition or treatment will be avoided or minimized Outcome: Progressing

## 2019-03-30 NOTE — Progress Notes (Signed)
ST Cancellation Note  Patient Details Name: Phyllis Hernandez MRN: [id number] DOB: 05-Aug-1950   Cancelled Treatment: Pt is currently OTF at HD.  ST will f/u as schedule allows.    Bretta Bang, M.S., [place] Office: [phone]

## 2019-03-30 NOTE — Progress Notes (Signed)
Subjective:  Phyllis Hernandez was seen in the dialysis unit this morning. She did not participate during the interview.  Objective:  Vital signs in last 24 hours: Vitals:   03/29/19 1656 03/29/19 2051 03/29/19 2335 03/30/19 0345  BP: (!) 97/58 (!) 110/54 (!) 124/94 127/67  Pulse: 73 88 89 93  Resp: 18 18  18   Temp: 99 F (37.2 C) 99.3 F (37.4 C)  99.9 F (37.7 C)  TempSrc: Oral Oral  Oral  SpO2: 100% 100% 100% 100%  Weight:      Height:      Physical Exam Vitals signs and nursing note reviewed.  Constitutional:      General: She is not in acute distress.    Appearance: Normal appearance. She is ill-appearing. She is not toxic-appearing.     Comments: Ill appearing female lying in bed, eyes open to verbal stimuli, but not participating with head shakes and nods.   HENT:     Head: Normocephalic and atraumatic.  Cardiovascular:     Rate and Rhythm: Normal rate and regular rhythm.     Pulses: Normal pulses.     Heart sounds: Normal heart sounds. No murmur. No friction rub. No gallop.   Pulmonary:     Effort: Pulmonary effort is normal.     Breath sounds: Normal breath sounds. No wheezing, rhonchi or rales.  Abdominal:     General: Abdomen is flat. Bowel sounds are normal.     Comments: PEG in place with no signs of drainage or discharge.   Skin:    General: Skin is warm.      Assessment/Plan:  Principal Problem:   ESRD (end stage renal disease) ([place]) Active Problems:   Type 2 diabetes mellitus with complication, without long-term current use of insulin (HCC)   Anemia of chronic disease   Hypertensive urgency   Volume overload   Goals of care, counseling/discussion   Advance care planning   Palliative care encounter   CVA (cerebral vascular accident) ([place])  Acute CVA: Non verbal with difficulty swallowing  - MRI shows multiple scattered acute ischemic infarcts involving the bilateral cerebral hemispheres. Underlying age related cerebral atrophy with advanced  chronic microvascular ischemic disease.    - PEG currently in use.   ESRD with missed HD for a year - Nephrology following, appreciate assistance. - Per nephrology Ms. Dexheimer is no longer a candidate for outpatient HD. She will receive sessions in the hospital.   - Placed palliative care consult for [place] discussion concerning next steps.  Hypotenison: - Blood pressures 69/58-132/39 - Will continue to monitor BP. - Continue  Metoprolol 12.5 BID - If hypotensive, check mental status and check lactic acid. If concerning and suspicious for shock, will contact pccm.  Bilateral LE wounds:  - MRI shows no osteomyelitis, abscess, or sinus tracks bilaterally -Continue Eucerin. - Continue changing wrappings as needed. - Continue Dilaudid 1-2 mg IV every dialysis session. - Continue Percocet/Roxicet Q4H PRN. - Ordered follow up with Wound Care for reassessment.   T2DM: BG stable with SSI - Patient's glucose has been climbing since her PEG tube placement. - Continue 15U Lantus QHS with moderate SSI Q4H.  Leukocytosis with Fever:  - On 03/28/19 residents were paged with a new fever of 102.7. They ordered a chest xray, which showed mild cardiomegaly with pulmonary vascular congestion and mild interstitial edema, urinalysis, blood cultures, and started vancomycin and cefepime.  - MRSA swab was negative D/C Vanc - WBC: 22.6 - Fever: 97.4-98.8 -  Trend CBC - MRI of bilateral lower extremities  shows no osteomyelitis, abscess, or sinus tracks bilaterally.  Microcytic anemia:  - Getting Ferrous gluconate during HD - Hb: 7.1 - No bleeding reported. Will trend H and H - CBC daily  VTE ppx: Heparin Code Status: FULL    Dispo: Discharge pending medical course and goal of care terms with family.   Maudie Mercury, MD 03/30/2019, 6:42 AM Pager: [phone]

## 2019-03-30 NOTE — Progress Notes (Signed)
Manufacturing engineer [facility])  Patients husband [name], called [facility] to request hospice for his wife.  Called John, no answer and no option to leave voicemail.  Update TOC manager with what spouse requested.  [facility] will follow and help with d/c planning.  Venia Carbon RN, BSN, [facility] Liaison (in [place]) [phone]

## 2019-03-30 NOTE — Progress Notes (Signed)
Palliative note:   Attempted to call patient's spouse for followup GOC. No answer- not able to leave message. Will have PMT followup tomorrow or Monday.   Noted patient's spouse called Hospice.   Mariana Kaufman, AGNP-C Palliative Medicine  Please call Palliative Medicine team phone with any questions [phone]. For individual providers please see AMION.  No charge note

## 2019-03-30 NOTE — Progress Notes (Signed)
  Speech Language Pathology Treatment: Dysphagia  Patient Details Name: Phyllis Hernandez MRN: [id number] DOB: 23-Nov-1950 Today's Date: 03/30/2019 Time: [id number] SLP Time Calculation (min) (ACUTE ONLY): 10 min  Assessment / Plan / Recommendation Clinical Impression  Pt was encountered asleep in bed following hemodialysis with husband present at bedside.  Pt minimally roused to max verbal and tactile cues; however she was unable to maintain alertness and her eyes remained closed throughout this session.  Pt was observed with trials of small ice chips x2, and she did not attempt labial closure or lingual manipulation with either trial.  Boluses were suctioned from the pt's oral cavity secondary to aspiration concerns.  SLP completed brief oral care with the pt; however, she bit down on the suction swab and she attempted to remove the swab from her oral cavity via LUE movements.   Recommend continuation of NPO with alternate means of nutrition and frequent oral care.  ST will continue to follow for dysphagia treatment.    HPI HPI: Phyllis Hernandez is a 68 yo female with a medical history of ESRD non-compliant with HD, HTN, HLD, T2DM and constipation who presented to the ED for evaluation of shortness of breath and fatigue.  Brain MRI on 9/26 identified "Multiple scattered acute ischemic infarcts involving the bilateral cerebral hemispheres." No prior known dysphagia hx.       SLP Plan  Continue with current plan of care       Recommendations  Diet recommendations: NPO Medication Administration: Via alternative means                Oral Care Recommendations: Oral care QID;Staff/trained caregiver to provide oral care Follow up Recommendations: Skilled Nursing facility;24 hour supervision/assistance SLP Visit Diagnosis: Dysphagia, oral phase (R13.11) Plan: Continue with current plan of care       Bretta Bang, M.S., [place] Office: [phone]               [place] 03/30/2019, 2:48 PM

## 2019-03-30 NOTE — Plan of Care (Signed)
  Problem: Education: Goal: Knowledge of General Education information will improve Description: Including pain rating scale, medication(s)/side effects and non-pharmacologic comfort measures Outcome: Progressing   Problem: Nutrition: Goal: Adequate nutrition will be maintained Outcome: Progressing   Problem: Elimination: Goal: Will not experience complications related to bowel motility Outcome: Progressing   

## 2019-03-30 NOTE — Progress Notes (Signed)
  Date: 03/30/2019  Patient name: Phyllis Hernandez  Medical record number: [id number]  Date of birth: [birth year]        I have seen and evaluated this patient and I have discussed the plan of care with the house staff. Please see their note for complete details. I concur with their findings with the following additions/corrections:   Difficult situation. 68 yo F admitted three weeks ago after not attending outpatient dialysis in 1 year with weakness and SOB. Anasarcic on arrival. Course complicated by CVA with multiple cerebral infarcts now with PEG tube for feeds. She is persistently altered and only able to nod yes or no to questions intermittently. Per report, family wishes to pursue full aggressive care. Nephrology has deemed patient not to be a candidate for outpatient dialysis. Unclear but sounds like dialysis at [facility] may be an option. Palliative care has been consulted for goals of care discussion, appreciate assistance. Pending this, she will either need LTAC placement or inpatient hospice.   Other issues include intermittent fevers and worsening leukocytosis of 22. Unclear source. Her last fever was two days ago 102.7 F. She has been on vancomycin and cefepime since. Blood cultures are NG < 12 hours. CXR without consolidation or PNA. She had a TEE on 03/19/19 without evidence of endocarditis. I do not believe she makes urine. She does have chronic LE wounds. MRI yesterday showed evidence of cellulitis but no evidence of osteomyelitis. Will continue with broad spectrum antibiotics for now. Can likely narrow for cellulitis coverage if blood cultures remain negative.    Velna Ochs, MD 03/30/2019, 12:56 PM

## 2019-03-30 NOTE — Procedures (Signed)
Patient was seen on dialysis and the procedure was supervised.  BFR 300  Via TDC BP is  120/59.   Patient appears to be tolerating treatment well- unable to use AVF today   Louis Meckel 03/30/2019

## 2019-03-30 NOTE — Progress Notes (Signed)
Patient ID: Phyllis Hernandez, female   DOB: 1950-09-18, 68 y.o.   MRN: [id number]  [facility] Progress Note   Assessment/ Plan:    1.  Multiple bilateral acute cerebral infarcts:    With consequent oropharyngeal dysphagia/dysphonia and PEG for nutritional support.   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  Essentially unresponsive - needs assist with all-  I do not think she is a candidate for OP dialysis at all in this state.  I would not put her in an OP unit.  She is not even really a dialysis candidate- need to get palliative care involved.  If cannot convince family that this is no quality of life to be preserved I think the only option is LTAC  2. ESRD:   On a TTS schedule here for now. Had been on dialysis between 01/06/2017 and 03/09/2018 after which she self-discontinued therapy until contact during this hospitalization.  Overall poor prognosis with sequelae of multiple bilateral acute cerebral infarcts/dysphagia requiring PEG tube.   - HD per TTS schedule here LUE AVF - unable to stick today (has bruit but weak) - using TDC.  Will next be due for HD on Tuesday unless different plans arise  3. Anemia chronic disease: With significant iron deficiency; IV iron supplementation and ESA. Is dropping- may need transfusion  4. Secondary hyperparathyroidism, hyperphos: phosphorus level improving on Renvela.  Actually low, will stop renvela   5. Tachycardia - back on metoprolol; control per primary team    6. Leukocytosis - note rising leukocytosis.  Work-up per primary team. Blood cultures from 10/3 NGTD.  Stable  today    7. Nutrition: oropharyngeal dysphagia; speech therapy is consulted  8. HTN:  Off of amlodipine- on metoprolol.  Stable      Subjective:   Seen in HD-  Non verbal, no commands- did look at me maybe   Objective:   BP 93/63   Pulse 68   Temp 98.6 F (37 C) (Oral)   Resp 19   Ht 5\' 4"  (1.626 m)   Wt 82 kg   SpO2 98%    BMI 31.03 kg/m   Physical Exam:  Gen: adult female in bed in NAD - stares, non verbal, cannot follow commands   CVS: S1 and S2; no rub; tachycardia  Resp: Anteriorly clear to auscultation, no rales or rhonchi.   Abd: Soft, obese, nontender Ext: min edema appreciated; bilateral legs in Ace wrapping- ischemic legs  Access: right IJ tunneled dialysis catheter; LUE AVF   Labs: BMET Recent Labs  Lab 03/24/19 0757 03/25/19 0550 03/26/19 0236 03/27/19 0341 03/28/19 0426 03/28/19 0656 03/29/19 0630 03/30/19 0720  NA 138 138 140 137 137  --  134* 133*  K 4.3 4.4 4.8 4.5 4.8  --  4.3 4.0  CL 99 98 99 97* 93*  --  93* 91*  CO2 23 21* 21* 24 26  --  23 28  GLUCOSE 127* 133* 133* 229* 257*  --  441* 267*  BUN 22 36* 50* 38* 70*  --  49* 78*  CREATININE 4.38* 6.10* 7.68* 5.15* 6.80*  --  4.90* 6.17*  CALCIUM 9.0 9.5 8.9 9.1 9.0  --  9.0 9.0  PHOS  --   --  5.8*  --   --  4.3  --  2.3*   CBC Recent Labs  Lab 03/27/19 0341 03/28/19 1503 03/29/19 0630 03/30/19 0720  WBC 19.4* 23.3* 21.4* 22.6*  NEUTROABS  --  19.6*  --   --   HGB 9.3* 9.3* 8.0* 7.1*  HCT 30.5* 30.7* 26.9* 23.5*  MCV 75.1* 75.4* 76.6* 77.0*  PLT 373 273 234 230   Medications:    . aspirin  325 mg Per Tube Daily  . Chlorhexidine Gluconate Cloth  6 each Topical Q0600  . Chlorhexidine Gluconate Cloth  6 each Topical Q0600  . darbepoetin (ARANESP) injection - DIALYSIS  100 mcg Intravenous Q Thu-HD  . doxercalciferol      . doxercalciferol  2 mcg Intravenous Q T,Th,Sa-HD  . ezetimibe  10 mg Per Tube Daily  . feeding supplement (PRO-STAT SUGAR FREE 64)  30 mL Per Tube BID  . heparin      . heparin injection (subcutaneous)  5,000 Units Subcutaneous Q8H  . hydrocerin   Topical BID  . insulin aspart  0-15 Units Subcutaneous Q4H  . insulin glargine  15 Units Subcutaneous QHS  . mouth rinse  15 mL Mouth Rinse BID  . metoprolol tartrate  12.5 mg Oral BID  . multivitamin  1 tablet Oral QHS  . polyethylene glycol   17 g Oral BID  . sennosides  5 mL Per Tube BID  . sevelamer carbonate  2.4 g Per Tube TID WC  . sodium chloride flush  3 mL Intravenous Q12H   Luke Rigsbee A Deborahann Poteat 03/30/2019, 9:35 AM

## 2019-03-31 LAB — GLUCOSE, CAPILLARY
Glucose-Capillary: 207 mg/dL — ABNORMAL HIGH (ref 70–99)
Glucose-Capillary: 237 mg/dL — ABNORMAL HIGH (ref 70–99)
Glucose-Capillary: 238 mg/dL — ABNORMAL HIGH (ref 70–99)
Glucose-Capillary: 244 mg/dL — ABNORMAL HIGH (ref 70–99)
Glucose-Capillary: 248 mg/dL — ABNORMAL HIGH (ref 70–99)
Glucose-Capillary: 249 mg/dL — ABNORMAL HIGH (ref 70–99)

## 2019-03-31 NOTE — Progress Notes (Signed)
Pharmacy Antibiotic Note  Phyllis Hernandez is a 68 y.o. female admitted on 03/08/2019 with weakness, SOB, hypertensive urgency after missing several HD sessions. Pharmacy has been consulted for cefepime dosing for sepsis. Patient has evidence of bilateral lower extremity wounds, without evidence of osteo or abscess on MRI. Patient is ESRD on HD TTS.  Patient has been receiving cefepime 2g IV with HD and vancomycin 1000mg  with HD. Vancomycin was discontinued on 10/10. WBC remain elevated at 22.6. Patient is afebrile.   Plan: Continue cefepime 2 gm IV on Tuesday, Thursday, Saturday after HD Monitor cultures/sensitivities, clinical progression and de-escalation of antibiotics  Height: 5' 4'' (162.6 cm) Weight: 78.1 kg IBW/kg (Calculated): 54.7 kg  Temp (24hrs), Avg:98.4 F (36.9 C), Min:97.4 F (36.3 C), Max:99.2 F (37.3 C)  Recent Labs  Lab 03/25/19 0550 03/26/19 0236 03/27/19 0341 03/28/19 0426 03/28/19 1503 03/29/19 0630 03/30/19 0720  WBC 17.7*  --  19.4*  --  23.3* 21.4* 22.6*  CREATININE 6.10* 7.68* 5.15* 6.80*  --  4.90* 6.17*    Estimated Creatinine Clearance: 9.4 mL/min (A) (by C-G formula based on SCr of 6.17 mg/dL (H)).    Allergies  Allergen Reactions  . No Known Allergies     Antimicrobials this admission: 9/23 Zosyn >> 9/26 9/23 Vancomycin >> 9/26; 10/8 >>10/10 Cefepime 10/8 >>  Microbiology results: 9/18 BCx: no growth final 9/23 BCx X 2: no growth final 10/3 BCx X 2: no growth final 10/8 MRSA PCR: negative 10/8: Bcx: no growth x2 days   Thank you for allowing pharmacy to be a part of this patient's care.  Cristela Felt, PharmD PGY1 Pharmacy Resident [employer]: [phone]  03/31/2019 8:50 AM

## 2019-03-31 NOTE — Progress Notes (Signed)
   Subjective: Patient was seen and evaluated at bedside. Appears uncomfortable when staff are trying to change her dress and clean.  Objective:  Vital signs in last 24 hours: Vitals:   03/30/19 2309 03/31/19 0354 03/31/19 0934 03/31/19 1700  BP:  (!) 111/47 114/67 (!) 99/48  Pulse: 97 98 (!) 101 87  Resp:  18 18 18   Temp:  97.8 F (36.6 C) 98.6 F (37 C) 97.7 F (36.5 C)  TempSrc:  Oral Oral Axillary  SpO2:  100% 92% 100%  Weight:      Height:       Ph/E: General: Appears uncomfortable when staff trying to move her Resp: No respiratory distress at room air Extremities: boots in place, no erythema, swelling at distals. Neuro: Alert, non verbal  Assessment/Plan:  Principal Problem:   ESRD (end stage renal disease) ([place]) Active Problems:   Type 2 diabetes mellitus with complication, without long-term current use of insulin (HCC)   Anemia of chronic disease   Hypertensive urgency   Volume overload   Goals of care, counseling/discussion   Advance care planning   Palliative care encounter   CVA (cerebral vascular accident) ([place])   Leukocytosis   Lower extremity ulceration ([place])   Cellulitis of lower extremity  Acute CVA: Non verbal with difficulty swallowing  - MRI shows multiple scattered acute ischemic infarcts involving the bilateral cerebral hemispheres. Underlying age related cerebral atrophy with advanced chronic microvascular ischemic disease.  - PEG currently in use.   ESRD with missed HD for a year - Nephrology following, appreciate assistance. - Per nephrology Ms. Sollazzo is no longer a candidate for outpatient HD. She will receive sessions while in the hospital and until [place] discusses.   - Placed palliative care consult    Bilateral LE wounds:  - MRI shows no osteomyelitis, abscess, or sinus tracks bilaterally -Continue Eucerin. - Continue changing wrappings as needed. - Continue Dilaudid 1-2 mg IV every dialysis session. - Continue  Percocet/Roxicet Q4H PRN. - Ordered follow up with Wound Care for reassessment.   T2DM: BG stable with SSI -Patient's glucose has been climbing since her PEG tube placement. - Continue 15U Lantus QHS with moderate SSI Q4H.  Leukocytosis with Fever:  MRI of bilateral lower extremities  shows no osteomyelitis, abscess, or sinus tracks bilaterally. BC negative so far No further fever  -Continue cefepime - Trend CBC   Microcytic anemia:  - Getting Ferrous gluconate during HD - Hb Stable - No bleeding reported. Will trend H and H - CBC daily   Dispo: Anticipated discharge pending [place] decision Dewayne Hatch, MD 03/31/2019, 7:16 PM Pager: [phone]

## 2019-03-31 NOTE — Progress Notes (Signed)
Patient ID: Phyllis Hernandez, female   DOB: May 20, 1951, 68 y.o.   MRN: [id number]  [name]   Assessment/ Plan:    1.  Multiple bilateral acute cerebral infarcts:    With consequent oropharyngeal dysphagia/dysphonia and PEG for nutritional support.   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  Essentially unresponsive - needs assist with all-   she is not a candidate for OP dialysis , She is not even really a dialysis candidate- need to get palliative care involved.  If cannot convince family that this is no quality of life to be preserved I think the only option is LTAC.  Husband seems open to discussion-  Apparently 2 children in prison and one is around in addition to him  2. ESRD:   On a TTS schedule here for now. Had been on dialysis between 01/06/2017 and 03/09/2018 after which she self-discontinued therapy until contact during this hospitalization.  Overall poor prognosis with sequelae of multiple bilateral acute cerebral infarcts/dysphagia requiring PEG tube.   - HD per TTS schedule here LUE AVF - unable to stick on Saturday (has bruit but weak) - using TDC.  Will next be due for HD on Tuesday unless different plans arise- almost is too unstable for HD   3. Anemia chronic disease: With significant iron deficiency; IV iron supplementation and ESA. Is dropping- may need transfusion at some point  4. Secondary hyperparathyroidism, hyperphos: phos low, stopped renvela   5. Tachycardia - back on metoprolol; control per primary team    6. Leukocytosis - note rising leukocytosis.  Work-up per primary team. Blood cultures from 10/3 NGTD.  Stable  today     7. HTN:  Off of amlodipine- on metoprolol.  Low BP-  Needs metoprolol for tachy      Subjective:   HD yest- only removed 500 due to low BP- is still a little low.  Apparently pts husband called hospice but seemed overwhelmed when they called him back- palliative care has yet to make  good contact with pts family.  Husband (actually estranged husband at bedside)  "I dont know what to do"  "I cant pull the plug on her"  But aknowledges that if she does not improve which I dont think she will- no verbal interaction, no recognition of what is happining- would not be the quality of life that many would want.      Objective:   BP 114/67 (BP Location: Right Wrist)   Pulse (!) 101   Temp 98.6 F (37 C) (Oral)   Resp 18   Ht 5\' 4"  (1.626 m)   Wt 80.9 kg   SpO2 92%   BMI 30.61 kg/m   Physical Exam:  Gen: adult female in bed in NAD - stares, non verbal, cannot follow commands   CVS: S1 and S2; no rub; tachycardia  Resp: Anteriorly clear to auscultation, no rales or rhonchi.   Abd: Soft, obese, nontender Ext: min edema appreciated; bilateral legs in Ace wrapping- ischemic legs  Access: right IJ tunneled dialysis catheter; LUE AVF - have not been able to use  Labs: BMET Recent Labs  Lab 03/25/19 0550 03/26/19 0236 03/27/19 0341 03/28/19 0426 03/28/19 0656 03/29/19 0630 03/30/19 0720  NA 138 140 137 137  --  134* 133*  K 4.4 4.8 4.5 4.8  --  4.3 4.0  CL 98 99 97* 93*  --  93* 91*  CO2 21* 21* 24 26  --  23 28  GLUCOSE 133* 133* 229* 257*  --  441* 267*  BUN 36* 50* 38* 70*  --  49* 78*  CREATININE 6.10* 7.68* 5.15* 6.80*  --  4.90* 6.17*  CALCIUM 9.5 8.9 9.1 9.0  --  9.0 9.0  PHOS  --  5.8*  --   --  4.3  --  2.3*   CBC Recent Labs  Lab 03/27/19 0341 03/28/19 1503 03/29/19 0630 03/30/19 0720  WBC 19.4* 23.3* 21.4* 22.6*  NEUTROABS  --  19.6*  --   --   HGB 9.3* 9.3* 8.0* 7.1*  HCT 30.5* 30.7* 26.9* 23.5*  MCV 75.1* 75.4* 76.6* 77.0*  PLT 373 273 234 230   Medications:    . aspirin  325 mg Per Tube Daily  . Chlorhexidine Gluconate Cloth  6 each Topical Q0600  . Chlorhexidine Gluconate Cloth  6 each Topical Q0600  . darbepoetin (ARANESP) injection - DIALYSIS  100 mcg Intravenous Q Thu-HD  . doxercalciferol  2 mcg Intravenous Q T,Th,Sa-HD  .  ezetimibe  10 mg Per Tube Daily  . feeding supplement (PRO-STAT SUGAR FREE 64)  30 mL Per Tube BID  . heparin injection (subcutaneous)  5,000 Units Subcutaneous Q8H  . hydrocerin   Topical BID  . insulin aspart  0-15 Units Subcutaneous Q4H  . insulin glargine  15 Units Subcutaneous QHS  . mouth rinse  15 mL Mouth Rinse BID  . metoprolol tartrate  12.5 mg Oral BID  . multivitamin  1 tablet Oral QHS  . polyethylene glycol  17 g Oral BID  . sennosides  5 mL Per Tube BID  . sodium chloride flush  3 mL Intravenous Q12H   Daveena Elmore A Mayson Sterbenz 03/31/2019, 11:38 AM

## 2019-03-31 NOTE — Progress Notes (Signed)
[facility] Documentation  Writer spoke with pt's husband this morning who stated that he was on the way to the hospital. RN offered to answer any questions he had about Hospice care. Husband said that he didn't really have any questions. RN explained that dialysis isn't typically covered under hospice, as it is often viewed as a life prolonging measure and asked husband if pt was planning on stopping dialysis. Husband did not know if stopping dialysis would be an option and seemed overwhelmed with initializing a goals of care conversation. Husband stated that he needed to talk with family and pt's doctors and said that he would call [facility] back with any questions.   Please reach out to [facility] with any needs. If family and pt aren't wishing to elect hospice, they could benefit from our community based palliative program.   Freddie Breech, RN  [facility] Liaison [phone]

## 2019-03-31 NOTE — Progress Notes (Signed)
  Date: 03/31/2019  Patient name: Phyllis Hernandez  Medical record number: [id number]  Date of birth: 09/14/1950   This patient's plan of care was discussed with the house staff. Please see their note for complete details. I concur with their findings.   Velna Ochs, MD 03/31/2019, 7:59 PM

## 2019-03-31 NOTE — Plan of Care (Signed)
  Problem: Clinical Measurements: Goal: Will remain free from infection Outcome: Progressing   Problem: Coping: Goal: Level of anxiety will decrease Outcome: Progressing   Problem: Clinical Measurements: Goal: Complications related to the disease process, condition or treatment will be avoided or minimized Outcome: Progressing

## 2019-04-01 LAB — CBC
HCT: 23.7 % — ABNORMAL LOW (ref 36.0–46.0)
Hemoglobin: 7.1 g/dL — ABNORMAL LOW (ref 12.0–15.0)
MCH: 23.4 pg — ABNORMAL LOW (ref 26.0–34.0)
MCHC: 30 g/dL (ref 30.0–36.0)
MCV: 78 fL — ABNORMAL LOW (ref 80.0–100.0)
Platelets: 257 10*3/uL (ref 150–400)
RBC: 3.04 MIL/uL — ABNORMAL LOW (ref 3.87–5.11)
RDW: 18.1 % — ABNORMAL HIGH (ref 11.5–15.5)
WBC: 19 10*3/uL — ABNORMAL HIGH (ref 4.0–10.5)
nRBC: 1 % — ABNORMAL HIGH (ref 0.0–0.2)

## 2019-04-01 LAB — GLUCOSE, CAPILLARY
Glucose-Capillary: 285 mg/dL — ABNORMAL HIGH (ref 70–99)
Glucose-Capillary: 255 mg/dL — ABNORMAL HIGH (ref 70–99)
Glucose-Capillary: 269 mg/dL — ABNORMAL HIGH (ref 70–99)
Glucose-Capillary: 247 mg/dL — ABNORMAL HIGH (ref 70–99)
Glucose-Capillary: 267 mg/dL — ABNORMAL HIGH (ref 70–99)

## 2019-04-01 LAB — RENAL FUNCTION PANEL
Albumin: 1.4 g/dL — ABNORMAL LOW (ref 3.5–5.0)
Anion gap: 13 (ref 5–15)
BUN: 84 mg/dL — ABNORMAL HIGH (ref 8–23)
CO2: 26 mmol/L (ref 22–32)
Calcium: 8.7 mg/dL — ABNORMAL LOW (ref 8.9–10.3)
Chloride: 96 mmol/L — ABNORMAL LOW (ref 98–111)
Creatinine, Ser: 6.06 mg/dL — ABNORMAL HIGH (ref 0.44–1.00)
GFR calc Af Amer: 8 mL/min — ABNORMAL LOW (ref >60)
GFR calc non Af Amer: 7 mL/min — ABNORMAL LOW (ref >60)
Glucose, Bld: 259 mg/dL — ABNORMAL HIGH (ref 70–99)
Phosphorus: 2.6 mg/dL (ref 2.5–4.6)
Potassium: 4.6 mmol/L (ref 3.5–5.1)
Sodium: 135 mmol/L (ref 135–145)

## 2019-04-01 MED ORDER — SODIUM CHLORIDE 0.9 % IV SOLN
2.0000 g | INTRAVENOUS | Status: DC
  Filled 2019-04-01: qty 2

## 2019-04-01 MED ORDER — INSULIN GLARGINE 100 UNIT/ML ~~LOC~~ SOLN
22.0000 [IU] | Freq: Every day | SUBCUTANEOUS | Status: DC
  Administered 2019-04-01: 22 [IU] via SUBCUTANEOUS
  Filled 2019-04-01 (×2): qty 0.22

## 2019-04-01 NOTE — TOC Progression Note (Signed)
Transition of Care [facility]) - Progression Note    Patient Details  Name: Phyllis Hernandez MRN: [id number] Date of Birth: March 31, 1951  [facility] CM/SW Contact  Bartholomew Crews, RN Phone Number: [phone] 04/01/2019, 3:41 PM  Clinical Narrative:    Spoke with clinical liaison on [facility]. Patient not clinically appropriate for LTAC services d/t continues to decline. Noted ongoing discussions with palliative and consideration for hospice care. Continuing to follow for transition of care needs.    Barriers to Discharge: Continued Medical Work up  Expected Discharge Plan and Services         Living arrangements for the past 2 months: Apartment                                       Social Determinants of Health (SDOH) Interventions    Readmission Risk Interventions No flowsheet data found.

## 2019-04-01 NOTE — Plan of Care (Signed)
  Problem: Clinical Measurements: Goal: Diagnostic test results will improve Outcome: Completed/Met   Problem: Activity: Goal: Risk for activity intolerance will decrease Outcome: Completed/Met   Problem: Nutrition: Goal: Adequate nutrition will be maintained Outcome: Completed/Met   Problem: Elimination: Goal: Will not experience complications related to bowel motility Outcome: Completed/Met Goal: Will not experience complications related to urinary retention Outcome: Completed/Met   Problem: Safety: Goal: Ability to remain free from injury will improve Outcome: Completed/Met   Problem: Education: Goal: Knowledge of disease and its progression will improve Outcome: Completed/Met   Problem: Fluid Volume: Goal: Compliance with measures to maintain balanced fluid volume will improve Outcome: Completed/Met   Problem: Health Behavior/Discharge Planning: Goal: Ability to manage health-related needs will improve Outcome: Completed/Met

## 2019-04-01 NOTE — Progress Notes (Addendum)
Subjective:  Patient seen at bedside. Patient was unresponsive to verbal stimuli, but opened her eyes to tactile stimuli. There was no participation during the interview.   Objective:  Vital signs in last 24 hours: Vitals:   03/31/19 0934 03/31/19 1700 03/31/19 1949 04/01/19 0412  BP: 114/67 (!) 99/48 (!) 113/49 (!) 105/35  Pulse: (!) 101 87 89 92  Resp: 18 18 18 18   Temp: 98.6 F (37 C) 97.7 F (36.5 C) 98.4 F (36.9 C) 99 F (37.2 C)  TempSrc: Oral Axillary Oral Oral  SpO2: 92% 100% 100% 100%  Weight:      Height:       Physical Exam Constitutional:      Appearance: She is normal weight.     Comments: Patient lying in bed. Minimally responsive to tactile stimuli.   HENT:     Head: Normocephalic and atraumatic.  Cardiovascular:     Rate and Rhythm: Normal rate and regular rhythm.     Pulses: Normal pulses.     Heart sounds: Normal heart sounds. No murmur. No friction rub. No gallop.   Pulmonary:     Effort: Pulmonary effort is normal.     Breath sounds: Normal breath sounds. No wheezing, rhonchi or rales.  Abdominal:     General: Abdomen is flat. Bowel sounds are normal.     Tenderness: There is no abdominal tenderness. There is no guarding.     Comments: PEG tube in place, clean dressings, with no erythema or drainage upon removal of bandages.   Neurological:     Mental Status: She is disoriented.     Assessment/Plan:  Principal Problem:   ESRD (end stage renal disease) ([place]) Active Problems:   Type 2 diabetes mellitus with complication, without long-term current use of insulin (HCC)   Anemia of chronic disease   Hypertensive urgency   Volume overload   Goals of care, counseling/discussion   Advance care planning   Palliative care encounter   CVA (cerebral vascular accident) ([place])   Leukocytosis   Lower extremity ulceration ([place])   Cellulitis of lower extremity  Acute CVA: Non verbal with difficulty swallowing  - MRI shows multiple scattered acute  ischemic infarcts involving the bilateral cerebral hemispheres. Underlying age related cerebral atrophy with advanced chronic microvascular ischemic disease.  - PEG currently in use.  - Husband reached out to hospice, according to the hospice note on 03/31/2019 they discussed continuing hemodialysis, and the husband stated that he needed to speak with the family before a decision can be made. Per hospice the patient could benefit from their community palliative program.   ESRD with missed HD for a year - Nephrology following, appreciate assistance. - Per nephrology Ms. Slayden is no longer a candidate for outpatient HD. She will receive sessions while in the hospital and until [place] discusses.   - Placed palliative care consult    Bilateral LE wounds:  - MRI shows no osteomyelitis, abscess, or sinus tracks bilaterally -Continue Eucerin. - Continue changing wrappings as needed. - Continue Dilaudid 1-2 mg IV every dialysis session. - Continue Percocet/Roxicet Q4H PRN. - Ordered follow up with Wound Care for reassessment.   T2DM: BG stable with SSI -Patient's glucose has been climbing since her PEG tube placement. - Continue 15U Lantus QHS with moderate SSI Q4H.  Leukocytosis with Fever:  MRI of bilateral lower extremities  shows no osteomyelitis, abscess, or sinus tracks bilaterally. - Blood cultures: NGTD - No further fever - WBC: 19.0 - Continue cefepime -  Trend CBC   Microcytic anemia:  - Getting Ferrous gluconate during HD - Hb Stable - No bleeding reported. Will trend H and H - CBC daily   Dispo: Anticipated discharge pending [place] decision Maudie Mercury, MD 04/01/2019, 6:32 AM Pager: [phone]

## 2019-04-01 NOTE — Progress Notes (Signed)
Patient ID: Phyllis Hernandez, female   DOB: 1951/04/05, 68 y.o.   MRN: [id number] S: lying in bed with eyes open but unresponsive O:BP (!) 106/34 (BP Location: Right Wrist)   Pulse 94   Temp 98.8 F (37.1 C) (Oral)   Resp 18   Ht 5\' 4"  (1.626 m)   Wt 80.9 kg   SpO2 100%   BMI 30.61 kg/m   Intake/Output Summary (Last 24 hours) at 04/01/2019 1524 Last data filed at 04/01/2019 1500 Gross per 24 hour  Intake 1560 ml  Output 2 ml  Net 1558 ml   Intake/Output: I/O last 3 completed shifts: In: 2338.3 [Other:200; NG/GT:2138.3] Out: 2 [Stool:2]  Intake/Output this shift:  Total I/O In: 360 [NG/GT:360] Out: 0  Weight change:  Gen: NAD, unresponsive CVS: no rub Resp: cta Abd: +BS, soft, NT/ND Ext: no edema  Recent Labs  Lab 03/26/19 0236 03/27/19 0341 03/28/19 0426 03/28/19 0656 03/29/19 0630 03/30/19 0720 04/01/19 0637  NA 140 137 137  --  134* 133* 135  K 4.8 4.5 4.8  --  4.3 4.0 4.6  CL 99 97* 93*  --  93* 91* 96*  CO2 21* 24 26  --  23 28 26   GLUCOSE 133* 229* 257*  --  441* 267* 259*  BUN 50* 38* 70*  --  49* 78* 84*  CREATININE 7.68* 5.15* 6.80*  --  4.90* 6.17* 6.06*  ALBUMIN  --   --   --   --   --  1.5* 1.4*  CALCIUM 8.9 9.1 9.0  --  9.0 9.0 8.7*  PHOS 5.8*  --   --  4.3  --  2.3* 2.6   Liver Function Tests: Recent Labs  Lab 03/30/19 0720 04/01/19 0637  ALBUMIN 1.5* 1.4*   No results for input(s): LIPASE, AMYLASE in the last 168 hours. No results for input(s): AMMONIA in the last 168 hours. CBC: Recent Labs  Lab 03/27/19 0341 03/28/19 1503 03/29/19 0630 03/30/19 0720 04/01/19 0637  WBC 19.4* 23.3* 21.4* 22.6* 19.0*  NEUTROABS  --  19.6*  --   --   --   HGB 9.3* 9.3* 8.0* 7.1* 7.1*  HCT 30.5* 30.7* 26.9* 23.5* 23.7*  MCV 75.1* 75.4* 76.6* 77.0* 78.0*  PLT 373 273 234 230 257   Cardiac Enzymes: No results for input(s): CKTOTAL, CKMB, CKMBINDEX, TROPONINI in the last 168 hours. CBG: Recent Labs  Lab 03/31/19 1940 03/31/19 2343  04/01/19 0410 04/01/19 0729 04/01/19 1147  GLUCAP 238* 237* 285* 255* 269*    Iron Studies: No results for input(s): IRON, TIBC, TRANSFERRIN, FERRITIN in the last 72 hours. Studies/Results: No results found. Marland Kitchen aspirin  325 mg Per Tube Daily  . Chlorhexidine Gluconate Cloth  6 each Topical Q0600  . Chlorhexidine Gluconate Cloth  6 each Topical Q0600  . darbepoetin (ARANESP) injection - DIALYSIS  100 mcg Intravenous Q Thu-HD  . doxercalciferol  2 mcg Intravenous Q T,Th,Sa-HD  . ezetimibe  10 mg Per Tube Daily  . feeding supplement (PRO-STAT SUGAR FREE 64)  30 mL Per Tube BID  . heparin injection (subcutaneous)  5,000 Units Subcutaneous Q8H  . hydrocerin   Topical BID  . insulin aspart  0-15 Units Subcutaneous Q4H  . insulin glargine  22 Units Subcutaneous QHS  . mouth rinse  15 mL Mouth Rinse BID  . metoprolol tartrate  12.5 mg Oral BID  . multivitamin  1 tablet Oral QHS  . polyethylene glycol  17 g Oral BID  .  sennosides  5 mL Per Tube BID  . sodium chloride flush  3 mL Intravenous Q12H    BMET    Component Value Date/Time   NA 135 04/01/2019 0637   K 4.6 04/01/2019 0637   CL 96 (L) 04/01/2019 0637   CO2 26 04/01/2019 0637   GLUCOSE 259 (H) 04/01/2019 0637   BUN 84 (H) 04/01/2019 0637   CREATININE 6.06 (H) 04/01/2019 0637   CALCIUM 8.7 (L) 04/01/2019 0637   CALCIUM 7.9 (L) 03/09/2019 0711   GFRNONAA 7 (L) 04/01/2019 0637   GFRAA 8 (L) 04/01/2019 0637   CBC    Component Value Date/Time   WBC 19.0 (H) 04/01/2019 0637   RBC 3.04 (L) 04/01/2019 0637   HGB 7.1 (L) 04/01/2019 0637   HCT 23.7 (L) 04/01/2019 0637   PLT 257 04/01/2019 0637   MCV 78.0 (L) 04/01/2019 0637   MCH 23.4 (L) 04/01/2019 0637   MCHC 30.0 04/01/2019 0637   RDW 18.1 (H) 04/01/2019 0637   LYMPHSABS 1.1 03/28/2019 1503   MONOABS 1.6 (H) 03/28/2019 1503   EOSABS 0.0 03/28/2019 1503   BASOSABS 0.1 03/28/2019 1503     Assessment/Plan:  1. 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  Pt remains completely dependent for ADLs and cannot interact or participate in therapies. 2. ESRD/CKD stage 5- had one session of HD on 03/30/19 via [facility].  She is not an outpatient candidate for HD given her current functional status and if she does not have any further neurologic recovery, she would be a permanent resident of a longterm care facility.  Appreciate Palliative care input and assistance with goals/limits of care.  Since she stopped dialysis herself in the past, she has already given US guidance of what she would want but await family's decision.  Will hold off on HD for now pending palliative care meeting. 3. Anemia of CKD- IV iron and ESA.  Transfuse prn. 4. sHPTH- off of binders due to low phos. 5. HTN- stable to low.  Off of amlodipine and on metoprolol for tachycardia. 6. Disposition- poor overall prognosis with poor quality of life.  Recommend transition to comfort measures.  Await palliative care meeting with family.   Donetta Potts, MD [employer] [phone]

## 2019-04-01 NOTE — Progress Notes (Addendum)
Daily Progress Note   Patient Name: Phyllis Hernandez       Date: 04/01/2019 DOB: 05-Nov-1950  Age: 68 y.o. MRN#: [id number] Attending Physician: Oda Kilts, MD Primary Care Physician: Patient, No Pcp Per Admit Date: 03/08/2019  Reason for Consultation/Follow-up: Establishing goals of care  Subjective: Patient in bed. Nonverbal. Does not follow commands. Spouse at bedside.  We discussed Phyllis Hernandez and what was important to her- how she defined living- Phyllis Hernandez states that Devie defined living as going to church, enjoying life.  Reviewed with Phyllis Hernandez, my concerns that Phyllis Hernandez will not be able to return to going to church and living her life as she defines living due to her stroke and apparent declines.  We reviewed her health choices. Phyllis Hernandez stated that Negar had made the choice to stop dialysis on her own- stating that she hated it, that it made her feels so sick. I asked Phyllis Hernandez if she knew that she would eventually die if she made that choice and he stated that she did.  We discussed how medical interventions are usually done with a purpose to fix something, or to improve a person's quality of life. In Micalah's case- it appears that dialysis was not doing either before she in the hospital or now.  We discussed the option of discontinuing dialysis, and tube feeding and allowing Phyllis Hernandez to progress through natural death with comfort and dignity, likely at Hospice.  Phyllis Hernandez was emotional. He was surprised to hear that Phyllis Hernandez likely would not return to a life where she would live independently and go to church.  He inquired about if she could go to a nursing home if he chose to keep body alive. I noted that there had been discussion that she could be placed in LTACH.  Phyllis Hernandez stated he would like to  discuss with his pastor- I offered to allow Phyllis Hernandez's pastor to meet with him and I tomorrow and Phyllis Hernandez agreed.   ROS  Length of Stay: 24  Current Medications: Scheduled Meds:  . aspirin  325 mg Per Tube Daily  . Chlorhexidine Gluconate Cloth  6 each Topical Q0600  . Chlorhexidine Gluconate Cloth  6 each Topical Q0600  . darbepoetin (ARANESP) injection - DIALYSIS  100 mcg Intravenous Q Thu-HD  . doxercalciferol  2 mcg Intravenous Q T,Th,Sa-HD  . ezetimibe  10 mg Per Tube Daily  . feeding supplement (PRO-STAT SUGAR FREE 64)  30 mL Per Tube BID  . heparin injection (subcutaneous)  5,000 Units Subcutaneous Q8H  . hydrocerin   Topical BID  . insulin aspart  0-15 Units Subcutaneous Q4H  . insulin glargine  22 Units Subcutaneous QHS  . mouth rinse  15 mL Mouth Rinse BID  . metoprolol tartrate  12.5 mg Oral BID  . multivitamin  1 tablet Oral QHS  . polyethylene glycol  17 g Oral BID  . sennosides  5 mL Per Tube BID  . sodium chloride flush  3 mL Intravenous Q12H    Continuous Infusions: . [START ON 03/21/2019] ceFEPime (MAXIPIME) IV    . feeding supplement (NEPRO CARB STEADY) 1,000 mL (04/01/19 1241)  . ferric gluconate (FERRLECIT/NULECIT) IV 125 mg (03/30/19 0847)    PRN Meds: acetaminophen (TYLENOL) oral liquid 160 mg/5 mL, cyclobenzaprine, oxyCODONE-acetaminophen  Physical Exam          Vital Signs: BP (!) 106/34 (BP Location: Right Wrist)   Pulse 94   Temp 98.8 F (37.1 C) (Oral)   Resp 18   Ht 5\' 4"  (1.626 m)   Wt 80.9 kg   SpO2 100%   BMI 30.61 kg/m  SpO2: SpO2: 100 % O2 Device: O2 Device: Nasal Cannula O2 Flow Rate: O2 Flow Rate (L/min): 2 L/min  Intake/output summary:   Intake/Output Summary (Last 24 hours) at 04/01/2019 1427 Last data filed at 04/01/2019 0900 Gross per 24 hour  Intake 1200 ml  Output 2 ml  Net 1198 ml   LBM: Last BM Date: (04/01/19) Baseline Weight: Weight: 101.3 kg Most recent weight: Weight: 80.9 kg       Palliative Assessment/Data:  PPS: 10%      Patient Active Problem List   Diagnosis Date Noted  . Leukocytosis   . Lower extremity ulceration ([place])   . Cellulitis of lower extremity   . CVA (cerebral vascular accident) ([place]) 03/18/2019  . Goals of care, counseling/discussion   . Advance care planning   . Palliative care encounter   . Hypertensive urgency 03/08/2019  . Volume overload 03/08/2019  . Closed displaced fracture of lateral malleolus of right fibula 02/09/2017  . ESRD (end stage renal disease) ([place]) 12/27/2016  . Elevated troponin 12/27/2016  . Anemia of chronic disease 12/27/2016  . Thrombocytopenia ([place]) 12/27/2016  . Constipation 12/27/2016  . Hypertensive emergency 12/09/2016  . Acute on chronic renal failure ([place]) 12/09/2016  . Type 2 diabetes mellitus with complication, without long-term current use of insulin ([place]) 02/27/2007  . Hyperlipidemia 02/27/2007  . Essential hypertension 02/27/2007    Palliative Care Assessment & Plan   Patient Profile:  68 y.o.femalewith past medical history of ESRD but noncompliant with HD (last HD 02/22/19), HTN, HLD, diabetes, morbid obesityadmitted on 9/18/2020with fatigue and SOB.Found to have hypertensive urgency and volume overload needing HD.Hospitalization has been complicated by acute stroke with significant functional decline with resultant dysphagia and aphasia. S/P PEG. Currently still being offered dialysis treatments.  Assessment/Recommendations/Plan   Patient's spouse has limited understanding, insight- on chart review- appears patient son- Phyllis Hernandez has been involved and patient has indicated previously she wanted him to be decision maker- I called and updated him as well- he plans to be here at 29 tomorrow with his father and pastor for f/u [place] discussion Prognosis:   Unable to determine  Discharge Planning:  To Be Determined  Care plan was discussed with patient's spouse  Thank you for  allowing the Palliative Medicine Team to  assist in the care of this patient.   Time In: 1330 Time Out: 1430 Total Time 60 mins Prolonged Time Billed yes      Greater than 50%  of this time was spent counseling and coordinating care related to the above assessment and plan.  Mariana Kaufman, AGNP-C Palliative Medicine   Please contact Palliative Medicine Team phone at [phone] for questions and concerns.

## 2019-04-01 NOTE — Plan of Care (Signed)
  Problem: Health Behavior/Discharge Planning: Goal: Ability to manage health-related needs will improve Outcome: Progressing   

## 2019-04-01 NOTE — Progress Notes (Signed)
Bilateral leg dressings changed. No drainage, and patient tolerated well.   Farley Ly RN

## 2019-04-01 NOTE — Progress Notes (Signed)
Physical Therapy Treatment Patient Details Name: Phyllis Hernandez MRN: [id number] DOB: 08-14-1950 Today's Date: 04/01/2019    History of Present Illness Pt is a 68 y.o. female admitted 03/08/19 with volume overload and hypertensive overload after missing HD sessions (per chart, has missed HD for approximately one year); pt opting to resume HD while admitted, but unable to tolerate complete session due to BLE pain. PMH includes ESRD (on HD), DM2, HTN, depression.    PT Comments    Pt lethargic and difficult to arouse this morning. Pt with brief eyes open to cold washcloth, and during PROM to LE's. During these instances, pt did not focus eyes on therapist or look in my direction with command to do so. Pt resisting active assist to wash her face with the washcloth, pulling hand away from face and swatting at therapist. This was the only intentional active movement I noted throughout session. Due to lethargy, focus of session was attempt at ADL task, repositioning in bed to modified chair position, and PROM to LE's. Of note, husband present at beginning of session and asking therapist's opinion regarding Hospice and "if it's time". Husband asking how he could initiate process. Encouraged husband to discuss with medical team as well as RN and Palliative Services to help him make a decision as this is a personal decision that therapist cannot comment on at this time. Will continue to follow and progress as able per POC.    Follow Up Recommendations  SNF;Supervision/Assistance - 24 hour     Equipment Recommendations  Other (comment)(tbd by next venue of care)    Recommendations for Other Services       Precautions / Restrictions Precautions Precautions: Fall;Other (comment) Precaution Comments: Watch BP Required Braces or Orthoses: Other Brace Other Brace: una boot Restrictions Weight Bearing Restrictions: No    Mobility  Bed Mobility Overal bed mobility: Needs Assistance              General bed mobility comments: Pt required total assist for repositioning in the bed. Not alert enough to participate in bed mobility or transfers.   Transfers                    Ambulation/Gait                 Stairs             Wheelchair Mobility    Modified Rankin (Stroke Patients Only)       Balance       Sitting balance - Comments: Unable to assess this session                                    Cognition Arousal/Alertness: Lethargic Behavior During Therapy: Flat affect Overall Cognitive Status: Impaired/Different from baseline                                        Exercises General Exercises - Lower Extremity Ankle Circles/Pumps: PROM;Both;10 reps;Supine Heel Slides: PROM;Both;10 reps;Supine    General Comments General comments (skin integrity, edema, etc.): Husband present at beginning of session.       Pertinent Vitals/Pain Pain Assessment: Faces Faces Pain Scale: Hurts little more Pain Location: Facial grimacing and eyes opening to passive ROM to LE's Pain Descriptors / Indicators: Grimacing Pain Intervention(s): Limited activity within  patient's tolerance;Monitored during session;Repositioned    Home Living                      Prior Function            PT Goals (current goals can now be found in the care plan section) Acute Rehab PT Goals Patient Stated Goal: not stated PT Goal Formulation: With patient Time For Goal Achievement: 03/24/19 Potential to Achieve Goals: Fair Progress towards PT goals: Progressing toward goals    Frequency    Min 2X/week      PT Plan Current plan remains appropriate    Co-evaluation              AM-PAC PT "6 Clicks" Mobility   Outcome Measure  Help needed turning from your back to your side while in a flat bed without using bedrails?: Total Help needed moving from lying on your back to sitting on the side of a flat bed without  using bedrails?: Total Help needed moving to and from a bed to a chair (including a wheelchair)?: Total Help needed standing up from a chair using your arms (e.g., wheelchair or bedside chair)?: Total Help needed to walk in hospital room?: Total Help needed climbing 3-5 steps with a railing? : Total 6 Click Score: 6    End of Session Equipment Utilized During Treatment: Other (comment)(lift equiptment) Activity Tolerance: Patient limited by lethargy;Patient limited by fatigue Patient left: in bed;with bed alarm set;with call bell/phone within reach Nurse Communication: Mobility status PT Visit Diagnosis: Other abnormalities of gait and mobility (R26.89);Muscle weakness (generalized) (M62.81);History of falling (Z91.81)     Time: [id number] PT Time Calculation (min) (ACUTE ONLY): 15 min  Charges:  $Therapeutic Activity: 8-22 mins                     Rolinda Roan, PT, DPT Acute Rehabilitation Services Pager: [phone] Office: [phone]    Thelma Comp 04/01/2019, 9:29 AM

## 2019-04-01 NOTE — Progress Notes (Signed)
  Speech Language Pathology Treatment: Dysphagia  Patient Details Name: Phyllis Hernandez MRN: [id number] DOB: [birth year] Today's Date: 04/01/2019 Time: [id number] SLP Time Calculation (min) (ACUTE ONLY): 10 min  Assessment / Plan / Recommendation Clinical Impression  Pt was encountered asleep in bed and she roused intermittently to tactile and verbal stimulation.  Pt with no vocalizations throughout this session.  She was observed with minimal trials of small ice chips and 1/8 tsp of thin liquid.  During ice chip trials, pt did not exhibit labial closure or lingual manipulation of the bolus and R anterior labial spillage was observed.  When thin liquid trials were presented via tsp, pt exhibited tight labial closure in an effort to refuse the bolus. No additional trials were attempted on this date.  SLP completed oral care with suction swab following po trials; however, it was abbreviated secondary to pt biting on swab and turning her head away from the SLP.  Of note, oral suctioning was performed and it revealed a moderate amount of secretions in the pt's oral cavity.  Recommend continuation of NPO with alternative means of nutrition and frequent oral care as tolerated.      HPI HPI: Phyllis Hernandez is a 68 yo female with a medical history of ESRD non-compliant with HD, HTN, HLD, T2DM and constipation who presented to the ED for evaluation of shortness of breath and fatigue.  Brain MRI on 9/26 identified "Multiple scattered acute ischemic infarcts involving the bilateral cerebral hemispheres." No prior known dysphagia hx.       SLP Plan  Continue with current plan of care       Recommendations  Diet recommendations: NPO Medication Administration: Via alternative means                Oral Care Recommendations: Oral care QID;Staff/trained caregiver to provide oral care Follow up Recommendations: Skilled Nursing facility;24 hour supervision/assistance SLP Visit Diagnosis: Dysphagia,  oral phase (R13.11) Plan: Continue with current plan of care       Bretta Bang, M.S., [place] Office: [phone]                [place] 04/01/2019, 10:11 AM

## 2019-04-01 NOTE — Progress Notes (Signed)
  Date: 04/01/2019  Patient name: Phyllis Hernandez  Medical record number: [id number]  Date of birth: 06-18-1951        I have seen and evaluated this patient and I have discussed the plan of care with the house staff. Please see their note for complete details. I concur with their findings with the following additions/corrections: [name] was seen this  AM on team rounds.  Her eyes are open but she is not responsive or interactive.  Nephrology has stated she is not an outpatient HD candidate.  If the family wants to continue HD, she will need an LTAC.  TOC note on October 9 indicated that [facility] has no dialysis beds but to follow-up this week. [facility] declined patient.  Her other option is hospice, either inpatient hospice or home hospice.  Palliative care is working with the family to finalize their decision.  Bartholomew Crews, MD 04/01/2019, 2:26 PM

## 2019-04-02 DIAGNOSIS — N179 Acute kidney failure, unspecified: Secondary | ICD-10-CM

## 2019-04-02 DIAGNOSIS — Z66 Do not resuscitate: Secondary | ICD-10-CM

## 2019-04-02 LAB — GLUCOSE, CAPILLARY
Glucose-Capillary: 242 mg/dL — ABNORMAL HIGH (ref 70–99)
Glucose-Capillary: 252 mg/dL — ABNORMAL HIGH (ref 70–99)
Glucose-Capillary: 266 mg/dL — ABNORMAL HIGH (ref 70–99)

## 2019-04-02 LAB — CBC
HCT: 24.7 % — ABNORMAL LOW (ref 36.0–46.0)
Hemoglobin: 7.2 g/dL — ABNORMAL LOW (ref 12.0–15.0)
MCH: 22.8 pg — ABNORMAL LOW (ref 26.0–34.0)
MCHC: 29.1 g/dL — ABNORMAL LOW (ref 30.0–36.0)
MCV: 78.2 fL — ABNORMAL LOW (ref 80.0–100.0)
Platelets: 278 10*3/uL (ref 150–400)
RBC: 3.16 MIL/uL — ABNORMAL LOW (ref 3.87–5.11)
RDW: 18.3 % — ABNORMAL HIGH (ref 11.5–15.5)
WBC: 19.5 10*3/uL — ABNORMAL HIGH (ref 4.0–10.5)
nRBC: 2 % — ABNORMAL HIGH (ref 0.0–0.2)

## 2019-04-02 LAB — RENAL FUNCTION PANEL
Albumin: 1.5 g/dL — ABNORMAL LOW (ref 3.5–5.0)
Anion gap: 18 — ABNORMAL HIGH (ref 5–15)
BUN: 110 mg/dL — ABNORMAL HIGH (ref 8–23)
CO2: 22 mmol/L (ref 22–32)
Calcium: 9 mg/dL (ref 8.9–10.3)
Chloride: 95 mmol/L — ABNORMAL LOW (ref 98–111)
Creatinine, Ser: 7.41 mg/dL — ABNORMAL HIGH (ref 0.44–1.00)
GFR calc Af Amer: 6 mL/min — ABNORMAL LOW (ref >60)
GFR calc non Af Amer: 5 mL/min — ABNORMAL LOW (ref >60)
Glucose, Bld: 272 mg/dL — ABNORMAL HIGH (ref 70–99)
Phosphorus: 2.9 mg/dL (ref 2.5–4.6)
Potassium: 5.3 mmol/L — ABNORMAL HIGH (ref 3.5–5.1)
Sodium: 135 mmol/L (ref 135–145)

## 2019-04-02 LAB — CULTURE, BLOOD (ROUTINE X 2)
Culture: NO GROWTH
Culture: NO GROWTH
Special Requests: ADEQUATE
Special Requests: ADEQUATE

## 2019-04-02 MED ORDER — SODIUM CHLORIDE 0.9 % IV SOLN
1.0000 g | INTRAVENOUS | Status: DC
  Filled 2019-04-02: qty 1

## 2019-04-02 MED ORDER — ACETAMINOPHEN 160 MG/5ML PO SOLN: 650.0000 mg | Freq: Four times a day (QID) | ORAL | 0 refills | Status: AC | PRN

## 2019-04-02 MED ORDER — HALOPERIDOL 0.5 MG PO TABS: 0.5000 mg | ORAL_TABLET | ORAL | 0 refills | Status: AC | PRN

## 2019-04-02 MED ORDER — LORAZEPAM 2 MG/ML PO CONC: 1.0000 mg | ORAL | Status: DC | PRN

## 2019-04-02 MED ORDER — ASPIRIN 325 MG PO TABS: 325.0000 mg | ORAL_TABLET | Freq: Every day | ORAL | 0 refills | Status: AC

## 2019-04-02 MED ORDER — HALOPERIDOL 0.5 MG PO TABS
0.5000 mg | ORAL_TABLET | ORAL | Status: DC | PRN
  Filled 2019-04-02: qty 1

## 2019-04-02 MED ORDER — LORAZEPAM 1 MG PO TABS: 1.0000 mg | ORAL_TABLET | ORAL | 0 refills | Status: AC | PRN

## 2019-04-02 MED ORDER — HYDROMORPHONE HCL 1 MG/ML IJ SOLN: 1.0000 mg | Freq: Four times a day (QID) | INTRAMUSCULAR | 0 refills | Status: AC

## 2019-04-02 MED ORDER — DIPHENHYDRAMINE HCL 50 MG/ML IJ SOLN: 12.5000 mg | INTRAMUSCULAR | 0 refills | Status: AC | PRN

## 2019-04-02 MED ORDER — SCOPOLAMINE 1 MG/3DAYS TD PT72: 1.0000 | MEDICATED_PATCH | TRANSDERMAL | 12 refills | Status: AC

## 2019-04-02 MED ORDER — LORAZEPAM 2 MG/ML IJ SOLN
1.0000 mg | INTRAMUSCULAR | Status: DC | PRN
  Administered 2019-04-02: 1 mg via INTRAVENOUS
  Filled 2019-04-02: qty 1

## 2019-04-02 MED ORDER — SCOPOLAMINE 1 MG/3DAYS TD PT72
1.0000 | MEDICATED_PATCH | TRANSDERMAL | Status: DC
  Administered 2019-04-02: 1.5 mg via TRANSDERMAL
  Filled 2019-04-02: qty 1

## 2019-04-02 MED ORDER — DIPHENHYDRAMINE HCL 50 MG/ML IJ SOLN: 12.5000 mg | INTRAMUSCULAR | Status: DC | PRN

## 2019-04-02 MED ORDER — HALOPERIDOL LACTATE 5 MG/ML IJ SOLN: 0.5000 mg | INTRAMUSCULAR | Status: DC | PRN

## 2019-04-02 MED ORDER — LORAZEPAM 1 MG PO TABS: 1.0000 mg | ORAL_TABLET | ORAL | Status: DC | PRN

## 2019-04-02 MED ORDER — INSULIN ASPART 100 UNIT/ML ~~LOC~~ SOLN
0.0000 [IU] | SUBCUTANEOUS | Status: DC
  Administered 2019-04-02: 7 [IU] via SUBCUTANEOUS

## 2019-04-02 MED ORDER — HALOPERIDOL LACTATE 2 MG/ML PO CONC
0.5000 mg | ORAL | Status: DC | PRN
  Filled 2019-04-02: qty 0.3

## 2019-04-02 MED ORDER — SODIUM POLYSTYRENE SULFONATE 15 GM/60ML PO SUSP
30.0000 g | Freq: Once | ORAL | Status: AC
  Administered 2019-04-02: 30 g
  Filled 2019-04-02: qty 120

## 2019-04-02 MED ORDER — HYDROMORPHONE HCL 1 MG/ML IJ SOLN
1.0000 mg | Freq: Four times a day (QID) | INTRAMUSCULAR | Status: DC
  Administered 2019-04-02: 1 mg via INTRAVENOUS
  Filled 2019-04-02 (×2): qty 1

## 2019-04-02 NOTE — Progress Notes (Signed)
Nutrition Brief Note  Chart reviewed. Pt now transitioning to comfort care.  No further nutrition interventions warranted at this time. Per palliative, plan to stop tube feedings.   Please re-consult as needed.   Mariana Single RD, LDN Clinical Nutrition Pager # [phone]

## 2019-04-02 NOTE — Progress Notes (Signed)
Gave report to the nurse Arbie Cookey at [facility] and answered all her questions.

## 2019-04-02 NOTE — Progress Notes (Signed)
Son and husband will be in to see patient at 48 PM.

## 2019-04-02 NOTE — Progress Notes (Signed)
Pharmacy Antibiotic Note  Phyllis Hernandez is a 68 y.o. female admitted on 03/08/2019 with weakness, SOB, hypertensive urgency after missing several HD sessions. Pharmacy has been consulted for cefepime dosing for sepsis. Patient has evidence of bilateral lower extremity wounds, without evidence of osteo or abscess on MRI.   The patient is noted to have AoCKD likely progressed to ESRD but not a HD candidate per renal. Last HD 10/10 - plan is to hold off and/or make daily assessments for HD nee per renal. Noted palliative on board for [place]. Will adjust Cefepime to daily dosing.  Plan: - Adjust Cefepime to 1g IV every 24 hours - Will continue to follow HD schedule/duration, culture results, LOT, and antibiotic de-escalation plans   Height: 5' 4'' (162.6 cm) Weight: 78.1 kg IBW/kg (Calculated): 54.7 kg  Temp (24hrs), Avg:99.4 F (37.4 C), Min:98.5 F (36.9 C), Max:101.7 F (38.7 C)  Recent Labs  Lab 03/28/19 0426 03/28/19 1503 03/29/19 0630 03/30/19 0720 04/01/19 0637 03/30/2019 0519  WBC  --  23.3* 21.4* 22.6* 19.0* 19.5*  CREATININE 6.80*  --  4.90* 6.17* 6.06* 7.41*    Estimated Creatinine Clearance: 7.8 mL/min (A) (by C-G formula based on SCr of 7.41 mg/dL (H)).    Allergies  Allergen Reactions  . No Known Allergies     Antimicrobials this admission: 9/23 Zosyn >> 9/26 9/23 Vancomycin >> 9/26; 10/8 >>10/10 Cefepime 10/8 >>  Microbiology results: 9/18 BCx: no growth final 9/23 BCx X 2: no growth final 10/3 BCx X 2: no growth final 10/8 MRSA PCR: negative 10/8: Bcx: no growth x2 days   Thank you for allowing pharmacy to be a part of this patient's care.  Alycia Rossetti, PharmD, BCPS Clinical Pharmacist Clinical phone for 03/27/2019: [phone] 04/15/2019 9:21 AM   **Pharmacist phone directory can now be found on [email or web address] ([email or web address]).  Listed under [place].

## 2019-04-02 NOTE — TOC Progression Note (Signed)
Transition of Care [facility]) - Progression Note    Patient Details  Name: [name] MRN: [id number] Date of Birth: [birth year]  Transition of Care [facility]) CM/SW Contact  Bartholomew Crews, RN Phone Number: [phone] 03/30/2019, 11:32 AM  Clinical Narrative:    Received message that family has elected hospice care with preference for [facility]. Referral placed to [facility] with [facility].      Barriers to Discharge: Continued Medical Work up  Expected Discharge Plan and Services         Living arrangements for the past 2 months: Apartment                                       Social Determinants of Health (SDOH) Interventions    Readmission Risk Interventions No flowsheet data found.

## 2019-04-02 NOTE — Progress Notes (Signed)
RN pronounced death at 25.  NOtrified MD, called son x 4 unable to reach him and no voice mail set up, notified [facility].

## 2019-04-02 NOTE — Progress Notes (Signed)
Manufacturing engineer [facility]) [facility] Liaison Note:   Received request from [place] for family interest in [facility] with request for transfer today. Chart reviewed. Spoke with patient son Legrand [place]) to confirm interest and explain services. Family agreeable to transfer and CSW aware.  Registration paper work completed.. Dr. Orpah Melter to assume care per family request.   Please fax discharge summary to [phone].   RN please call report to [phone].   Please arrange transport for patient to arrive as soon as possible.  Thank you.   Gar Ponto, RN [facility] Liaison  [place] are on [facility]

## 2019-04-02 NOTE — Progress Notes (Signed)
Subjective:  Phyllis Hernandez was seen at bedside today. She was unresponsive to verbal stimuli, and uncommunicative during the interview. We spoke with palliative that stated she is now on comfort care and DNR.   Objective:  Vital signs in last 24 hours: Vitals:   04/05/2019 0014 03/30/2019 0417 04/01/2019 0610 04/01/2019 0900  BP:  (!) 85/34  (!) 88/43  Pulse:  84  84  Resp: 20 18  (!) 21  Temp: 99.4 F (37.4 C) (!) 101.7 F (38.7 C) 98.5 F (36.9 C) 98.8 F (37.1 C)  TempSrc: Oral Oral Oral Oral  SpO2: 98% 100%  97%  Weight:      Height:       Physical Exam Vitals signs and nursing note reviewed.  Constitutional:      Appearance: She is normal weight.     Comments: Patient appears uncomfortable, diaphoretic at bedside. She is unable to participate during the interview.   HENT:     Head: Normocephalic and atraumatic.  Cardiovascular:     Rate and Rhythm: Normal rate and regular rhythm.     Pulses: Normal pulses.     Heart sounds: Normal heart sounds. No murmur. No friction rub. No gallop.   Pulmonary:     Effort: Pulmonary effort is normal.     Breath sounds: No wheezing, rhonchi or rales.  Abdominal:     General: Abdomen is flat. Bowel sounds are normal.     Tenderness: There is no abdominal tenderness. There is no guarding.    Assessment/Plan:  Principal Problem:   ESRD (end stage renal disease) ([place]) Active Problems:   Type 2 diabetes mellitus with complication, without long-term current use of insulin (HCC)   Anemia of chronic disease   Hypertensive urgency   Volume overload   Goals of care, counseling/discussion   Advance care planning   Palliative care encounter   CVA (cerebral vascular accident) ([place])   Leukocytosis   Lower extremity ulceration ([place])   Cellulitis of lower extremity  Acute CVA: Non verbal with difficulty swallowing  - MRI shows multiple scattered acute ischemic infarcts involving the bilateral cerebral hemispheres. Underlying age related  cerebral atrophy with advanced chronic microvascular ischemic disease.  - PEG currently in use.  - Family states that they will make Phyllis Hernandez DNR with comfort care.   ESRD Without HD for a Year:  - Nephrology following, appreciate assistance. - Per nephrology Phyllis Hernandez is no longer a candidate for outpatient HD. Her HD sessions have been put on hold.  - Palliative care spoke with the family today and the consensus is that she should be transitioned to comfort care and her code status transitioned to DNR.   Bilateral LE Wounds:  - MRI shows no osteomyelitis, abscess, or sinus tracks bilaterally -Continue Eucerin. - Continue changing wrappings as needed. - Continue Dilaudid 1-2 mg IV every dialysis session. - Continue Percocet/Roxicet Q4H PRN. - Ordered follow up with Wound Care for reassessment.   T2DM: BG Stable with SSI -Patient's glucose has been climbing since her PEG tube placement. - Continue 15U Lantus QHS with moderate SSI Q4H.  Leukocytosis with Fever:  MRI of bilateral lower extremities  shows no osteomyelitis, abscess, or sinus tracks bilaterally. - Blood cultures: NGTD - No further fever - WBC: 19.0 - Continue cefepime - Trend CBC  Microcytic Anemia:  - Getting Ferrous gluconate during HD - Hb Stable - No bleeding reported. Will trend H and H - CBC daily  Dispo: Anticipated discharge pending  [place] decision Maudie Mercury, MD 03/21/2019, 11:48 AM [id number]

## 2019-04-02 NOTE — Progress Notes (Signed)
Daily Progress Note   Patient Name: Phyllis Hernandez       Date: 04/07/2019 DOB: 02-Sep-1950  Age: 68 y.o. MRN#: [id number] Attending Physician: Oda Kilts, MD Primary Care Physician: Patient, No Pcp Per Admit Date: 03/08/2019  Reason for Consultation/Follow-up: Establishing goals of care and Terminal Care  Subjective: Met with patient's son, and patient's pastor. Reviewed patient's course. Her stroke, her lack of progress in functional capacity.  We discussed what was important to her and what she considered quality of life- all agreed that going to church, interacting family was very important. We discussed that she would likely not reach a point of being able to do those things. Discussed that current interventions were keeping her body alive, but were not improving her quality of life or fixing her medical problems.  Patient and son agreed that no family members would be able to provide care for her in her current state, additionally, I let them know that LTAC had declined due to she was not likely to rehab.  We discussed how John had told us that Fraser Din made the decision herself to stop dialysis- she had said she would rather die than go because it made her feel so bad.  Given all information- patient's son supported decision to transition to full comfort care, DNR, stop dialysis and transfer patient to residential Hospice.  We discussed patient's husband cognitive functioning. Doristine Bosworth and son stated that patient's husband and cognitive and memory deficits and is not able to be main decision maker or contact. My interactions with him are in alignment with their assessment.  John arrived after our discussion. The [facility] reviewed our discussion with him. John stated that Pat loved the pastor  and he agreed with whatever the pastor and Hoover Browns agreed to.  ROS  Length of Stay: 25  Current Medications: Scheduled Meds:  . aspirin  325 mg Per Tube Daily  . Chlorhexidine Gluconate Cloth  6 each Topical Q0600  . darbepoetin (ARANESP) injection - DIALYSIS  100 mcg Intravenous Q Thu-HD  . doxercalciferol  2 mcg Intravenous Q T,Th,Sa-HD  . ezetimibe  10 mg Per Tube Daily  . feeding supplement (PRO-STAT SUGAR FREE 64)  30 mL Per Tube BID  . heparin injection (subcutaneous)  5,000 Units Subcutaneous Q8H  . hydrocerin   Topical BID  .  insulin aspart  0-20 Units Subcutaneous Q4H  . insulin glargine  22 Units Subcutaneous QHS  . mouth rinse  15 mL Mouth Rinse BID  . metoprolol tartrate  12.5 mg Oral BID  . multivitamin  1 tablet Oral QHS  . polyethylene glycol  17 g Oral BID  . sennosides  5 mL Per Tube BID  . sodium chloride flush  3 mL Intravenous Q12H    Continuous Infusions: . ceFEPime (MAXIPIME) IV    . feeding supplement (NEPRO CARB STEADY) 1,000 mL (04/01/19 1241)  . ferric gluconate (FERRLECIT/NULECIT) IV 125 mg (03/30/19 0847)    PRN Meds: acetaminophen (TYLENOL) oral liquid 160 mg/5 mL, cyclobenzaprine, oxyCODONE-acetaminophen  Physical Exam Vitals signs and nursing note reviewed.  Constitutional:      Appearance: She is ill-appearing.  Cardiovascular:     Pulses: Normal pulses.  Pulmonary:     Breath sounds: Rhonchi present.  Neurological:     Comments: unresponsive             Vital Signs: BP (!) 88/43 (BP Location: Right Wrist)   Pulse 84   Temp 98.8 F (37.1 C) (Oral)   Resp (!) 21   Ht _0  (1.626 m)   Wt 80.9 kg   SpO2 97%   BMI 30.61 kg/m  SpO2: SpO2: 97 % O2 Device: O2 Device: Nasal Cannula O2 Flow Rate: O2 Flow Rate (L/min): 2 L/min  Intake/output summary:   Intake/Output Summary (Last 24 hours) at 04/17/2019 1057 Last data filed at 04/11/2019 0600 Gross per 24 hour  Intake 1155 ml  Output 0 ml  Net 1155 ml   LBM: Last BM Date:  04/18/2019 Baseline Weight: Weight: 101.3 kg Most recent weight: Weight: 80.9 kg       Palliative Assessment/Data: PPS: 10%      Patient Active Problem List   Diagnosis Date Noted  . Leukocytosis   . Lower extremity ulceration ([place])   . Cellulitis of lower extremity   . CVA (cerebral vascular accident) ([place]) 03/18/2019  . Goals of care, counseling/discussion   . Advance care planning   . Palliative care encounter   . Hypertensive urgency 03/08/2019  . Volume overload 03/08/2019  . Closed displaced fracture of lateral malleolus of right fibula 02/09/2017  . ESRD (end stage renal disease) ([place]) 12/27/2016  . Elevated troponin 12/27/2016  . Anemia of chronic disease 12/27/2016  . Thrombocytopenia ([place]) 12/27/2016  . Constipation 12/27/2016  . Hypertensive emergency 12/09/2016  . Acute on chronic renal failure ([place]) 12/09/2016  . Type 2 diabetes mellitus with complication, without long-term current use of insulin ([place]) 02/27/2007  . Hyperlipidemia 02/27/2007  . Essential hypertension 02/27/2007    Palliative Care Assessment & Plan   Patient Profile: 68 y.o.femalewith past medical history of ESRD but noncompliant with HD (last HD 02/22/19), HTN, HLD, diabetes, morbid obesityadmitted on 9/18/2020with fatigue and SOB.Found to have hypertensive urgency and volume overload needing HD.Hospitalization has been complicated by acute stroke with significant functional decline with resultant dysphagia and aphasia. S/P PEG. Currently still being offered dialysis treatments.  Assessment/Recommendations/Plan   DNR  Stop dialysis, stop tube feeding, stop all life prolonging interventions  Referral to social work for [employer]- please use [employer] as main point of contact  Comfort medications will be ordered  Comfort interventions only  Goals of Care and Additional Recommendations:  Limitations on Scope of Treatment: Full Comfort Care  Code Status:  DNR   Prognosis:   < 2 weeks due to ESRD,  CVA, nonresponsive, not eating, now transitioning to full comfort care  Discharge Planning:  [place] was discussed with patient's son, pastor, spouse.  Thank you for allowing the Palliative Medicine Team to assist in the care of this patient.   Time In: 1000 Time Out: 1115 Total Time 75 minutes Prolonged Time Billed yes      Greater than 50%  of this time was spent counseling and coordinating care related to the above assessment and plan.  Mariana Kaufman, AGNP-C Palliative Medicine   Please contact Palliative Medicine Team phone at [phone] for questions and concerns.

## 2019-04-02 NOTE — Progress Notes (Signed)
Patient ID: Phyllis Hernandez, female   DOB: July 26, 1950, 68 y.o.   MRN: [id number] S: family has decided to proceed with hospice and have chosen [facility] O:BP (!) 88/43 (BP Location: Right Wrist)   Pulse 84   Temp 98.8 F (37.1 C) (Oral)   Resp (!) 21   Ht 5\' 4"  (1.626 m)   Wt 80.9 kg   SpO2 97%   BMI 30.61 kg/m   Intake/Output Summary (Last 24 hours) at 03/26/2019 1151 Last data filed at 04/01/2019 0600 Gross per 24 hour  Intake 1155 ml  Output 0 ml  Net 1155 ml   Intake/Output: I/O last 3 completed shifts: In: 2355 [Other:240; NG/GT:2115] Out: 2 [Stool:2]  Intake/Output this shift:  No intake/output data recorded. Weight change:  Gen: unresponsive to voice or tactile stimuli CVS:no rub Resp: occ rhonchi Abd: +BS, soft Ext: trace edema  Recent Labs  Lab 03/27/19 0341 03/28/19 0426 03/28/19 0656 03/29/19 0630 03/30/19 0720 04/01/19 0637 04/17/2019 0519  NA 137 137  --  134* 133* 135 135  K 4.5 4.8  --  4.3 4.0 4.6 5.3*  CL 97* 93*  --  93* 91* 96* 95*  CO2 24 26  --  23 28 26 22   GLUCOSE 229* 257*  --  441* 267* 259* 272*  BUN 38* 70*  --  49* 78* 84* 110*  CREATININE 5.15* 6.80*  --  4.90* 6.17* 6.06* 7.41*  ALBUMIN  --   --   --   --  1.5* 1.4* 1.5*  CALCIUM 9.1 9.0  --  9.0 9.0 8.7* 9.0  PHOS  --   --  4.3  --  2.3* 2.6 2.9   Liver Function Tests: Recent Labs  Lab 03/30/19 0720 04/01/19 0637 03/27/2019 0519  ALBUMIN 1.5* 1.4* 1.5*   No results for input(s): LIPASE, AMYLASE in the last 168 hours. No results for input(s): AMMONIA in the last 168 hours. CBC: Recent Labs  Lab 03/28/19 1503 03/29/19 0630 03/30/19 0720 04/01/19 0637 04/18/2019 0519  WBC 23.3* 21.4* 22.6* 19.0* 19.5*  NEUTROABS 19.6*  --   --   --   --   HGB 9.3* 8.0* 7.1* 7.1* 7.2*  HCT 30.7* 26.9* 23.5* 23.7* 24.7*  MCV 75.4* 76.6* 77.0* 78.0* 78.2*  PLT 273 234 230 257 278   Cardiac Enzymes: No results for input(s): CKTOTAL, CKMB, CKMBINDEX, TROPONINI in the last 168  hours. CBG: Recent Labs  Lab 04/01/19 1539 04/01/19 2010 04/01/2019 0006 04/06/2019 0413 04/14/2019 0743  GLUCAP 247* 267* 266* 252* 242*    Iron Studies: No results for input(s): IRON, TIBC, TRANSFERRIN, FERRITIN in the last 72 hours. Studies/Results: No results found. Marland Kitchen aspirin  325 mg Per Tube Daily  .  HYDROmorphone (DILAUDID) injection  1 mg Intravenous Q6H  . scopolamine  1 patch Transdermal Q72H    BMET    Component Value Date/Time   NA 135 04/20/2019 0519   K 5.3 (H) 04/20/2019 0519   CL 95 (L) 03/30/2019 0519   CO2 22 04/20/2019 0519   GLUCOSE 272 (H) 04/10/2019 0519   BUN 110 (H) 04/14/2019 0519   CREATININE 7.41 (H) 04/12/2019 0519   CALCIUM 9.0 04/03/2019 0519   CALCIUM 7.9 (L) 03/09/2019 0711   GFRNONAA 5 (L) 03/28/2019 0519   GFRAA 6 (L) 03/25/2019 0519   CBC    Component Value Date/Time   WBC 19.5 (H) 04/01/2019 0519   RBC 3.16 (L) 03/25/2019 0519   HGB 7.2 (L) 03/25/2019  0519   HCT 24.7 (L) 04/11/2019 0519   PLT 278 04/04/2019 0519   MCV 78.2 (L) 03/31/2019 0519   MCH 22.8 (L) 04/05/2019 0519   MCHC 29.1 (L) 04/03/2019 0519   RDW 18.3 (H) 03/22/2019 0519   LYMPHSABS 1.1 03/28/2019 1503   MONOABS 1.6 (H) 03/28/2019 1503   EOSABS 0.0 03/28/2019 1503   BASOSABS 0.1 03/28/2019 1503    Assessment/Plan:  1. Multiple bilateral acute cerebral infarcts- with orpharyngeal dysphagia/dysphonia and s/p PEG.  Pt remains completely dependent for ADLs and cannot interact or participate in therapies. 2. ESRD/CKD stage 5- had one session of HD on 03/30/19 via [facility].  She is not an outpatient candidate for HD given her current functional status.  Agree with the family to transition to comfort care and hospice at [facility].  Appreciate Palliative care input and assistance with goals/limits of care.  Will sign off, please call with questions or concerns. 3. Anemia of CKD- stop ESA. 4. sHPTH- off of binders due to low phos. 5. HTN- stable to low.  Off of amlodipine  and on metoprolol for tachycardia. 6. Disposition- poor overall prognosis with poor quality of life.  Agree with transition to comfort measures.  Await placement at [facility].  Will discontinue temp HD catheter.  Donetta Potts, MD [employer] [phone]

## 2019-04-02 NOTE — Progress Notes (Signed)
Family in to see patient.

## 2019-04-02 NOTE — Progress Notes (Signed)
  Date: 04/01/2019  Patient name: Phyllis Hernandez  Medical record number: [id number]  Date of birth: Nov 08, 1950        I have seen and evaluated this patient and I have discussed the plan of care with the house staff. Please see their note for complete details. I concur with their findings with the following additions/corrections: Ms. Steckler was seen this morning on team rounds.  She was not responsive and for the first time, appeared uncomfortable with increased respiratory rate and work of breathing.  She is now comfort care and will be transferred to [facility] today.  Bartholomew Crews, MD 04/03/2019, 1:37 PM

## 2019-04-02 NOTE — Discharge Summary (Addendum)
Name: Phyllis Hernandez MRN: [id number] DOB: 03-18-1951 68 y.o. PCP: Patient, No Pcp Per  Date of Admission: 03/08/2019  5:25 AM Date of Discharge: 04/15/2019 Attending Physician: Oda Kilts, MD  Discharge Diagnosis: 1. Acute CVA 2. ESRD 3. Bilateral LE Wounds 4. T2DM 5. Leukocytosis with fever 6. Hypertensive Urgency  Discharge Medications: Allergies as of 04/01/2019      Reactions   No Known Allergies       Medication List    STOP taking these medications   Insulin Pen Needle 29G X 12MM Misc     TAKE these medications   acetaminophen 160 MG/5ML solution Commonly known as: TYLENOL Place 20.3 mLs (650 mg total) into feeding tube every 6 (six) hours as needed for mild pain or fever (>101).   aspirin 325 MG tablet Place 1 tablet (325 mg total) into feeding tube daily. Start taking on: April 03, 2019   diphenhydrAMINE 50 MG/ML injection Commonly known as: BENADRYL Inject 0.25 mLs (12.5 mg total) into the vein every 4 (four) hours as needed for itching.   haloperidol 0.5 MG tablet Commonly known as: HALDOL Take 1 tablet (0.5 mg total) by mouth every 4 (four) hours as needed for agitation (or delirium).   HYDROmorphone 1 MG/ML injection Commonly known as: DILAUDID Inject 1 mL (1 mg total) into the vein every 6 (six) hours.   LORazepam 1 MG tablet Commonly known as: ATIVAN Take 1 tablet (1 mg total) by mouth every 4 (four) hours as needed for anxiety.   scopolamine 1 MG/3DAYS Commonly known as: TRANSDERM-SCOP Place 1 patch (1.5 mg total) onto the skin every 3 (three) days.       Disposition and follow-up:   Ms.Phyllis Hernandez was discharged from [facility] in [place] condition.  At the hospital follow up visit please address:  1.  Comfort Care Measures  2.  Labs / imaging needed at time of follow-up: N/A  3.  Pending labs/ test needing follow-up: N/A  Follow-up Appointments: Contact information for after-discharge care    Destination    [facility] .   Service: Skilled Nursing Contact information: [place] [facility] Course by problem list: 1. ESRD:  Patient first arrived to [facility] with volume overloaded after missing approximately a year of HD sessions. During her time at [facility] she received a ECHO TEE to rule out volume overload due to CHF, it was found that her EF was 60-65%. She received HD during her inpatient stay.   2. Hypertensice Urgency:  Patient arrived to [facility] with hypertensive urgency after not taking her medications for a year. Her BP on admission was 211/97 she was started on metoprolol 12.5mg , metoprolol toartrate 25 mg BID,  Amlodipine 10 mg QD, IV Hydralazine 5 mg q6h PRN, and dialysis. She was stabilized and continued on Metoprolol 12.5 mg BID.   3. Bilateral Leg Wounds:  Patient arrived with bilateral hyperpigmented papules on bilateral UE R>L, dry scaly skin on bilateral LE with desquamation and open wounds on bilateral shins without signs of infection. Wound care was consulted and the patient's wounds were wrapped and treated with Eucerin cream for dry skin. Her wrappings were changed regularly. She was placed in Prevalon boots, with continual application of Eucerin cream.   4. T2DM: Patient was admitted with a chronic history of T2DM, and was managed with SSI. During her admission  she transitioned to a PEG tube her sliding scale was changed to Q4H and continued to be managed.   5. Leukocytosis with Fever:  Patient spiked a fever of a 101.9 on 03/13/2019 leukocytosis of 13.9 as well as 03/28/2019 with a fever of 102.7. She underwent a urinalysis, and blood cultures during this time which showed no growth. She completed a course of vancomycin and zosyn during this time. Her leukocytosis remained in the 13-19 range for which an MRI of her leg wounds, ECHO TEE, blood cultures, were ordered, and all studies  came back negative for cardiac vegetations, osteomyelitis, and bacterial growth.   6. Acute CVA:  On 03/16/2019 The patient appeared lethargic and was not participate during her interviews, and appeared severely lethargic. An MRI was ordered, which showed multifocal bilateral strokes of unclear etiology. We ordered a bubble study exam which came back negative, ECHO to rule out septic emboli came back negative. Due to her stroke, Ms. Ducey's condition continued to worsen. She failed her swallow study, which ultimately led to the placement of a PEG tube. She was only able to participate in interviews by nodding and shaking her head to yes or no questions, and towards the end of her stay she began to be increasingly lethargic. Patient had been on heparin throughout her stay.     Discharge Vitals:   BP (!) 88/43 (BP Location: Right Wrist)   Pulse 84   Temp 98.8 F (37.1 C) (Oral)   Resp (!) 21   Ht 5\' 4"  (1.626 m)   Wt 80.9 kg   SpO2 97%   BMI 30.61 kg/m   Pertinent Labs, Studies, and Procedures:  CT Head 03/13/2019 CT HEAD WITHOUT CONTRAST  TECHNIQUE: Contiguous axial images were obtained from the base of the skull through the vertex without intravenous contrast.  COMPARISON:  CT scan of August 21, 2009.  FINDINGS: Brain: Mild chronic ischemic white matter disease is noted. No mass effect or midline shift is noted. Ventricular size is within normal limits. There is no evidence of mass lesion, hemorrhage or acute infarction.  Vascular: No hyperdense vessel or unexpected calcification.  Skull: Normal. Negative for fracture or focal lesion.  Sinuses/Orbits: No acute finding.  Other: None.  IMPRESSION: Mild chronic ischemic white matter disease. No acute intracranial abnormality seen.   MR Brain 03/16/2019 COMPARISON:  Prior CT from 03/13/2019.  FINDINGS: Brain: Examination moderately degraded by motion artifact.  Multiple scattered acute ischemic infarcts seen  involving the bilateral cerebral hemispheres. These involve the deep and periventricular white matter of both cerebral hemispheres, with patchy involvement of the corpus callosum and basal ganglia. For reference purposes, largest area of infarction seen at the left basal ganglia and measures 12 mm. Small infarct involving the left fornix/anterior commissure noted. No associated hemorrhage or mass effect. Infarcts are predominantly small vessel type in appearance.  Gray-white matter differentiation otherwise maintained. No acute intracranial hemorrhage. Underlying age-related cerebral atrophy with advanced chronic microvascular ischemic disease. No mass lesion, midline shift, or mass effect. No hydrocephalus. No extra-axial fluid collection.  Vascular: Major intracranial vascular flow voids are maintained at the skull base.  Skull and upper cervical spine: Unremarkable.  Sinuses/Orbits: Globes and orbital soft tissues demonstrate no acute finding. Paranasal sinuses are largely clear. No significant mastoid effusion.  Other: None.  IMPRESSION: 1. Multiple scattered acute ischemic infarcts involving the bilateral cerebral hemispheres as above. No associated hemorrhage or mass effect. 2. Underlying age-related cerebral atrophy with advanced chronic microvascular ischemic disease.  CLINICAL  DATA:  Evaluation of anatomy for gastrostomy tube placement.  EXAM: 03/21/2019 CT ABDOMEN WITHOUT CONTRAST  TECHNIQUE: Multidetector CT imaging of the abdomen was performed following the standard protocol without IV contrast.  COMPARISON:  CT dated April 20, 2018.  FINDINGS: Lower chest: The lung bases are clear.The heart is significantly enlarged.  Hepatobiliary: The liver is normal. Normal gallbladder.There is no biliary ductal dilation.  Pancreas: Normal contours without ductal dilatation. No peripancreatic fluid collection.  Spleen: No splenic laceration or  hematoma.  Adrenals/Urinary Tract:  --Adrenal glands: There is relatively stable nodularity of both adrenal glands.  --Right kidney/ureter: No hydronephrosis or perinephric hematoma.  --Left kidney/ureter: No hydronephrosis or perinephric hematoma.  --Urinary bladder: Outside the field of view.  Stomach/Bowel:  --Stomach/Duodenum: Portions of the gastric body are posterior to the liver. The distal gastric body/antrum does not demonstrate colon or liver anteriorly.  --Small bowel: No dilatation or inflammation.  --Colon: There is oral contrast in the ascending colon and hepatic flexure. There is a small amount of contrast in the transverse colon.  --Appendix: Outside the field of view.  Vascular/Lymphatic: Extensive vascular calcifications are noted involving virtually all of the arterial structures.  --No retroperitoneal lymphadenopathy.  --No mesenteric lymphadenopathy.  Other: No ascites or free air. The abdominal wall is normal.  Musculoskeletal. No acute displaced fractures.  IMPRESSION: 1. No acute abnormality detected. 2. Hepatic in gastric anatomy as above. 3. Cardiomegaly.  Aortic Atherosclerosis (ICD10-I70.0).  03/23/2019 EXAM: PORTABLE CHEST 1 VIEW  COMPARISON:  March 13, 2019  FINDINGS: There is mild cardiomegaly. A right-sided central venous catheter is seen with the tip in the right atrium. There is mild prominence to the pulmonary vasculature. No large airspace consolidation or pleural effusion. The visualized skeletal structures are unremarkable.  IMPRESSION: Mild cardiomegaly and pulmonary vascular congestion.  DG Tibia/Fibula R and L EXAM: RIGHT TIBIA AND FIBULA - 2 VIEW  COMPARISON:  01/13/2017  FINDINGS: No acute fracture or dislocation. Generalized osteopenia. No aggressive osseous lesion. Soft tissue calcifications in the right lower leg likely dystrophic. Peripheral vascular atherosclerotic disease.   IMPRESSION: 1. No osteomyelitis of the right lower leg. No acute osseous abnormality of the right lower leg.  EXAM: LEFT TIBIA AND FIBULA - 2 VIEW  COMPARISON:  None.  FINDINGS: Mild diffuse osteopenia. Minimal degenerative changes over the knee. No evidence of fracture or dislocation. No focal lytic or sclerotic lesion. Small vessel atherosclerotic disease is present. No focal soft tissue abnormality. No air within the soft tissues.  IMPRESSION: No focal acute abnormality.  Osteopenia mild degenerative changes of the left knee. Atherosclerotic disease.  EXAM: MRI OF LOWER RIGHT EXTREMITY WITHOUT CONTRAST; MRI OF LOWER LEFT EXTREMITY WITHOUT CONTRAST  TECHNIQUE: Multiplanar, multisequence MR imaging of the bilateral lower extremity was performed. No intravenous contrast was administered.  COMPARISON:  None.  FINDINGS: Right:  Bones/Joint/Cartilage  Normal osseous marrow signal is seen throughout. No areas of osteomyelitis, cortical destruction, or periosteal reaction. No avascular necrosis. No large knee or ankle joint effusion is seen.  Ligaments  Suboptimally visualized  Muscles and Tendons  There is diffuse fatty atrophy noted within the musculature surrounding the right lower extremity. No focal muscular tear or fluid collection. The tendon surrounding the lower extremity are intact. No evidence of tendinopathy or tear.  Soft tissues  There is areas of superficial skin irregularity/ulceration seen along the anterolateral aspect of the lower extremity with diffuse skin thickening and subcutaneous edema. No loculated fluid collections or sinus tract is seen.  Left:  Bones/Joint/Cartilage  Normal osseous marrow signal is seen throughout. No areas of osteomyelitis, cortical destruction, or periosteal reaction. No avascular necrosis. No large knee or ankle joint effusion is seen.  Ligaments  Suboptimally visualized   Muscles and Tendons  There is diffuse fatty atrophy noted within the musculature surrounding the right lower extremity. No focal muscular tear or fluid collection. The tendon surrounding the lower extremity are intact. No evidence of tendinopathy or tear.  Soft tissues  There is areas of skin irregularity/ulceration seen along the anteromedial and anterolateral aspect of the lower extremity with diffuse skin thickening and subcutaneous edema. No loculated fluid collections or sinus tract is seen.  IMPRESSION: Areas of superficial ulceration surrounding both lower extremities with cellulitis. No evidence of abscess or sinus tract.  No evidence of osteomyelitis or acute osseous abnormality.  CLINICAL DATA:  Bilateral lower extremity wounds and ulcerations  EXAM: MRI OF LOWER RIGHT EXTREMITY WITHOUT CONTRAST; MRI OF LOWER LEFT EXTREMITY WITHOUT CONTRAST  TECHNIQUE: Multiplanar, multisequence MR imaging of the bilateral lower extremity was performed. No intravenous contrast was administered.  COMPARISON:  None.  FINDINGS: Right:  Bones/Joint/Cartilage  Normal osseous marrow signal is seen throughout. No areas of osteomyelitis, cortical destruction, or periosteal reaction. No avascular necrosis. No large knee or ankle joint effusion is seen.  Ligaments  Suboptimally visualized  Muscles and Tendons  There is diffuse fatty atrophy noted within the musculature surrounding the right lower extremity. No focal muscular tear or fluid collection. The tendon surrounding the lower extremity are intact. No evidence of tendinopathy or tear.  Soft tissues  There is areas of superficial skin irregularity/ulceration seen along the anterolateral aspect of the lower extremity with diffuse skin thickening and subcutaneous edema. No loculated fluid collections or sinus tract is seen.  Left:  Bones/Joint/Cartilage  Normal osseous marrow signal is seen  throughout. No areas of osteomyelitis, cortical destruction, or periosteal reaction. No avascular necrosis. No large knee or ankle joint effusion is seen.  Ligaments  Suboptimally visualized  Muscles and Tendons  There is diffuse fatty atrophy noted within the musculature surrounding the right lower extremity. No focal muscular tear or fluid collection. The tendon surrounding the lower extremity are intact. No evidence of tendinopathy or tear.  Soft tissues  There is areas of skin irregularity/ulceration seen along the anteromedial and anterolateral aspect of the lower extremity with diffuse skin thickening and subcutaneous edema. No loculated fluid collections or sinus tract is seen.  IMPRESSION: Areas of superficial ulceration surrounding both lower extremities with cellulitis. No evidence of abscess or sinus tract.  No evidence of osteomyelitis or acute osseous abnormality.  Discharge Instructions: Discharge Instructions    Diet - low sodium heart healthy   Complete by: As directed    Increase activity slowly   Complete by: As directed     Continue Comfort Care during her hospice stay.   Signed: Maudie Mercury, MD 04/18/2019, 1:54 PM   Pager: [phone]

## 2019-04-02 NOTE — Progress Notes (Signed)
[facility] [place]) and notified about the situation, he said he is going to try to reach patients son.

## 2019-04-02 NOTE — Discharge Instructions (Signed)
Thank you for choosing [facility] for your medical care. You were admitted for increased blood pressure and volume overload. Your blood pressure was treated with anti hypertensive drugs and your overload status was treated with dialysis. During your stay you had a stroke which affected your swallowing and speech. You were placed with a PEG tube for nutrition. You also had a temperature during your stay with an increase in your white blood cells. You were imaged to see if your leg wounds, heart, or blood was the image of infection, and your imaging and labs were negative. You completed a full antibiotic course.

## 2019-04-02 NOTE — TOC Transition Note (Signed)
Transition of Care [facility]) - CM/SW Discharge Note   Patient Details  Name: Phyllis Hernandez MRN: [id number] Date of Birth: May 24, 1951  Transition of Care [facility]) CM/SW Contact:  Bartholomew Crews, RN Phone Number: [phone] 03/29/2019, 12:53 PM   Clinical Narrative:    Received message that patient will transition to [facility] today. MD notified for discharge summary. Hospice liaison to advise when patient can be transported.    Final next level of care: [place] Barriers to Discharge: No Barriers Identified   Patient Goals and CMS Choice Patient states their goals for this hospitalization and ongoing recovery are:: [facility] CMS Medicare.gov Compare Post Acute Care list provided to:: Patient Represenative (must comment) Choice offered to / list presented to : Adult Children, Spouse  Discharge Placement                       Discharge Plan and Services                DME Arranged: N/A DME Agency: NA       HH Arranged: NA HH Agency: NA        Social Determinants of Health (SDOH) Interventions     Readmission Risk Interventions No flowsheet data found.

## 2019-04-02 NOTE — Progress Notes (Signed)
Patient sent down to the morgue.

## 2019-04-02 NOTE — Progress Notes (Signed)
Spoke with pastor and husband via 3 way calling. Phyllis Hernandez will call son if they want to come to the hospital to see patient.

## 2019-04-02 NOTE — TOC Transition Note (Signed)
Transition of Care [facility]) - CM/SW Discharge Note   Patient Details  Name: LATARCHA PREAST MRN: [id number] Date of Birth: [birth year]  Transition of Care [facility]) CM/SW Contact:  Bartholomew Crews, RN Phone Number: [phone] 03/30/2019, 4:52 PM   Clinical Narrative:    Received call from [facility] liaison that family has completed admission paperwork. Patient is ready for transport. Advised bedside RN - provided number to call report. Notified MD of DNR form - completed. [facility] notified for transport. DC summary faxed to [facility] [phone].    Final next level of care: [place] Barriers to Discharge: No Barriers Identified   Patient Goals and CMS Choice Patient states their goals for this hospitalization and ongoing recovery are:: [facility] CMS Medicare.gov Compare Post Acute Care list provided to:: Patient Represenative (must comment) Choice offered to / list presented to : Adult Children, Spouse  Discharge Placement                       Discharge Plan and Services                DME Arranged: N/A DME Agency: NA       HH Arranged: NA HH Agency: NA        Social Determinants of Health (SDOH) Interventions     Readmission Risk Interventions No flowsheet data found.

## 2019-04-02 NOTE — Progress Notes (Signed)
Called son and husband no answer. Unable to leave voice mail as well. Will try again.

## 2019-04-02 NOTE — Progress Notes (Addendum)
Name: Phyllis Hernandez MRN: [id number] DOB: [birth year] 68 y.o. PCP: Patient, No Pcp Per  Date of Admission: 03/08/2019  5:25 AM Date of Discharge: 04/07/2019 Attending Physician: Oda Kilts, MD  Discharge Diagnosis: 1. Acute CVA 2. ESRD 3. Bilateral LE Wounds 4. T2DM 5. Leukocytosis with fever 6. Hypertensive Urgency  Discharge Medications: Allergies as of 04/01/2019      Reactions   No Known Allergies       Medication List    STOP taking these medications   Insulin Pen Needle 29G X 12MM Misc     TAKE these medications   acetaminophen 160 MG/5ML solution Commonly known as: TYLENOL Place 20.3 mLs (650 mg total) into feeding tube every 6 (six) hours as needed for mild pain or fever (>101).   aspirin 325 MG tablet Place 1 tablet (325 mg total) into feeding tube daily. Start taking on: April 03, 2019   diphenhydrAMINE 50 MG/ML injection Commonly known as: BENADRYL Inject 0.25 mLs (12.5 mg total) into the vein every 4 (four) hours as needed for itching.   haloperidol 0.5 MG tablet Commonly known as: HALDOL Take 1 tablet (0.5 mg total) by mouth every 4 (four) hours as needed for agitation (or delirium).   HYDROmorphone 1 MG/ML injection Commonly known as: DILAUDID Inject 1 mL (1 mg total) into the vein every 6 (six) hours.   LORazepam 1 MG tablet Commonly known as: ATIVAN Take 1 tablet (1 mg total) by mouth every 4 (four) hours as needed for anxiety.   scopolamine 1 MG/3DAYS Commonly known as: TRANSDERM-SCOP Place 1 patch (1.5 mg total) onto the skin every 3 (three) days.       Disposition and follow-up:   [name] was discharged from [facility] in [place] condition.  At the hospital follow up visit please address:  1.  Comfort Care Measures  2.  Labs / imaging needed at time of follow-up: N/A  3.  Pending labs/ test needing follow-up: N/A  Follow-up Appointments: Contact information for after-discharge care    Destination    [facility] .   Service: Skilled Nursing Contact information: [place] [facility] Course by problem list: 1. ESRD:  Patient first arrived to [facility] with volume overloaded after missing approximately a year of HD sessions. During her time at [facility] she received a ECHO TEE to rule out volume overload due to CHF, it was found that her EF was 60-65%. She received HD during her inpatient stay.   2. Hypertensice Urgency:  Patient arrived to [facility] with hypertensive urgency after not taking her medications for a year. Her BP on admission was 211/97 she was started on metoprolol 12.5mg , metoprolol toartrate 25 mg BID,  Amlodipine 10 mg QD, IV Hydralazine 5 mg q6h PRN, and dialysis. She was stabilized and continued on Metoprolol 12.5 mg BID.   3. Bilateral Leg Wounds:  Patient arrived with bilateral hyperpigmented papules on bilateral UE R>L, dry scaly skin on bilateral LE with desquamation and open wounds on bilateral shins without signs of infection. Wound care was consulted and the patient's wounds were wrapped and treated with Eucerin cream for dry skin. Her wrappings were changed regularly. She was placed in Prevalon boots, with continual application of Eucerin cream.   4. T2DM: Patient was admitted with a chronic history of T2DM, and was managed with SSI. During her admission  she transitioned to a PEG tube her sliding scale was changed to Q4H and continued to be managed.   5. Leukocytosis with Fever:  Patient spiked a fever of a 101.9 on 03/13/2019 leukocytosis of 13.9 as well as 03/28/2019 with a fever of 102.7. She underwent a urinalysis, and blood cultures during this time which showed no growth. She completed a course of vancomycin and zosyn during this time. Her leukocytosis remained in the 13-19 range for which an MRI of her leg wounds, ECHO TEE, blood cultures, were ordered, and all studies  came back negative for cardiac vegetations, osteomyelitis, and bacterial growth.   6. Acute CVA:  On 03/16/2019 The patient appeared lethargic and was not participate during her interviews, and appeared severely lethargic. An MRI was ordered, which showed multifocal bilateral strokes of unclear etiology. We ordered a bubble study exam which came back negative, ECHO to rule out septic emboli came back negative. Due to her stroke, Ms. Ferrucci's condition continued to worsen. She failed her swallow study, which ultimately led to the placement of a PEG tube. She was only able to participate in interviews by nodding and shaking her head to yes or no questions, and towards the end of her stay she began to be increasingly lethargic. Patient had been on heparin throughout her stay.     Discharge Vitals:   BP (!) 88/43 (BP Location: Right Wrist)   Pulse 84   Temp 98.8 F (37.1 C) (Oral)   Resp (!) 21   Ht 5\' 4"  (1.626 m)   Wt 80.9 kg   SpO2 97%   BMI 30.61 kg/m   Pertinent Labs, Studies, and Procedures:  CT Head 03/13/2019 CT HEAD WITHOUT CONTRAST  TECHNIQUE: Contiguous axial images were obtained from the base of the skull through the vertex without intravenous contrast.  COMPARISON:  CT scan of August 21, 2009.  FINDINGS: Brain: Mild chronic ischemic white matter disease is noted. No mass effect or midline shift is noted. Ventricular size is within normal limits. There is no evidence of mass lesion, hemorrhage or acute infarction.  Vascular: No hyperdense vessel or unexpected calcification.  Skull: Normal. Negative for fracture or focal lesion.  Sinuses/Orbits: No acute finding.  Other: None.  IMPRESSION: Mild chronic ischemic white matter disease. No acute intracranial abnormality seen.   MR Brain 03/16/2019 COMPARISON:  Prior CT from 03/13/2019.  FINDINGS: Brain: Examination moderately degraded by motion artifact.  Multiple scattered acute ischemic infarcts seen  involving the bilateral cerebral hemispheres. These involve the deep and periventricular white matter of both cerebral hemispheres, with patchy involvement of the corpus callosum and basal ganglia. For reference purposes, largest area of infarction seen at the left basal ganglia and measures 12 mm. Small infarct involving the left fornix/anterior commissure noted. No associated hemorrhage or mass effect. Infarcts are predominantly small vessel type in appearance.  Gray-white matter differentiation otherwise maintained. No acute intracranial hemorrhage. Underlying age-related cerebral atrophy with advanced chronic microvascular ischemic disease. No mass lesion, midline shift, or mass effect. No hydrocephalus. No extra-axial fluid collection.  Vascular: Major intracranial vascular flow voids are maintained at the skull base.  Skull and upper cervical spine: Unremarkable.  Sinuses/Orbits: Globes and orbital soft tissues demonstrate no acute finding. Paranasal sinuses are largely clear. No significant mastoid effusion.  Other: None.  IMPRESSION: 1. Multiple scattered acute ischemic infarcts involving the bilateral cerebral hemispheres as above. No associated hemorrhage or mass effect. 2. Underlying age-related cerebral atrophy with advanced chronic microvascular ischemic disease.  CLINICAL  DATA:  Evaluation of anatomy for gastrostomy tube placement.  EXAM: 03/21/2019 CT ABDOMEN WITHOUT CONTRAST  TECHNIQUE: Multidetector CT imaging of the abdomen was performed following the standard protocol without IV contrast.  COMPARISON:  CT dated April 20, 2018.  FINDINGS: Lower chest: The lung bases are clear.The heart is significantly enlarged.  Hepatobiliary: The liver is normal. Normal gallbladder.There is no biliary ductal dilation.  Pancreas: Normal contours without ductal dilatation. No peripancreatic fluid collection.  Spleen: No splenic laceration or  hematoma.  Adrenals/Urinary Tract:  --Adrenal glands: There is relatively stable nodularity of both adrenal glands.  --Right kidney/ureter: No hydronephrosis or perinephric hematoma.  --Left kidney/ureter: No hydronephrosis or perinephric hematoma.  --Urinary bladder: Outside the field of view.  Stomach/Bowel:  --Stomach/Duodenum: Portions of the gastric body are posterior to the liver. The distal gastric body/antrum does not demonstrate colon or liver anteriorly.  --Small bowel: No dilatation or inflammation.  --Colon: There is oral contrast in the ascending colon and hepatic flexure. There is a small amount of contrast in the transverse colon.  --Appendix: Outside the field of view.  Vascular/Lymphatic: Extensive vascular calcifications are noted involving virtually all of the arterial structures.  --No retroperitoneal lymphadenopathy.  --No mesenteric lymphadenopathy.  Other: No ascites or free air. The abdominal wall is normal.  Musculoskeletal. No acute displaced fractures.  IMPRESSION: 1. No acute abnormality detected. 2. Hepatic in gastric anatomy as above. 3. Cardiomegaly.  Aortic Atherosclerosis (ICD10-I70.0).  03/23/2019 EXAM: PORTABLE CHEST 1 VIEW  COMPARISON:  March 13, 2019  FINDINGS: There is mild cardiomegaly. A right-sided central venous catheter is seen with the tip in the right atrium. There is mild prominence to the pulmonary vasculature. No large airspace consolidation or pleural effusion. The visualized skeletal structures are unremarkable.  IMPRESSION: Mild cardiomegaly and pulmonary vascular congestion.  DG Tibia/Fibula R and L EXAM: RIGHT TIBIA AND FIBULA - 2 VIEW  COMPARISON:  01/13/2017  FINDINGS: No acute fracture or dislocation. Generalized osteopenia. No aggressive osseous lesion. Soft tissue calcifications in the right lower leg likely dystrophic. Peripheral vascular atherosclerotic disease.   IMPRESSION: 1. No osteomyelitis of the right lower leg. No acute osseous abnormality of the right lower leg.  EXAM: LEFT TIBIA AND FIBULA - 2 VIEW  COMPARISON:  None.  FINDINGS: Mild diffuse osteopenia. Minimal degenerative changes over the knee. No evidence of fracture or dislocation. No focal lytic or sclerotic lesion. Small vessel atherosclerotic disease is present. No focal soft tissue abnormality. No air within the soft tissues.  IMPRESSION: No focal acute abnormality.  Osteopenia mild degenerative changes of the left knee. Atherosclerotic disease.  EXAM: MRI OF LOWER RIGHT EXTREMITY WITHOUT CONTRAST; MRI OF LOWER LEFT EXTREMITY WITHOUT CONTRAST  TECHNIQUE: Multiplanar, multisequence MR imaging of the bilateral lower extremity was performed. No intravenous contrast was administered.  COMPARISON:  None.  FINDINGS: Right:  Bones/Joint/Cartilage  Normal osseous marrow signal is seen throughout. No areas of osteomyelitis, cortical destruction, or periosteal reaction. No avascular necrosis. No large knee or ankle joint effusion is seen.  Ligaments  Suboptimally visualized  Muscles and Tendons  There is diffuse fatty atrophy noted within the musculature surrounding the right lower extremity. No focal muscular tear or fluid collection. The tendon surrounding the lower extremity are intact. No evidence of tendinopathy or tear.  Soft tissues  There is areas of superficial skin irregularity/ulceration seen along the anterolateral aspect of the lower extremity with diffuse skin thickening and subcutaneous edema. No loculated fluid collections or sinus tract is seen.  Left:  Bones/Joint/Cartilage  Normal osseous marrow signal is seen throughout. No areas of osteomyelitis, cortical destruction, or periosteal reaction. No avascular necrosis. No large knee or ankle joint effusion is seen.  Ligaments  Suboptimally visualized   Muscles and Tendons  There is diffuse fatty atrophy noted within the musculature surrounding the right lower extremity. No focal muscular tear or fluid collection. The tendon surrounding the lower extremity are intact. No evidence of tendinopathy or tear.  Soft tissues  There is areas of skin irregularity/ulceration seen along the anteromedial and anterolateral aspect of the lower extremity with diffuse skin thickening and subcutaneous edema. No loculated fluid collections or sinus tract is seen.  IMPRESSION: Areas of superficial ulceration surrounding both lower extremities with cellulitis. No evidence of abscess or sinus tract.  No evidence of osteomyelitis or acute osseous abnormality.  CLINICAL DATA:  Bilateral lower extremity wounds and ulcerations  EXAM: MRI OF LOWER RIGHT EXTREMITY WITHOUT CONTRAST; MRI OF LOWER LEFT EXTREMITY WITHOUT CONTRAST  TECHNIQUE: Multiplanar, multisequence MR imaging of the bilateral lower extremity was performed. No intravenous contrast was administered.  COMPARISON:  None.  FINDINGS: Right:  Bones/Joint/Cartilage  Normal osseous marrow signal is seen throughout. No areas of osteomyelitis, cortical destruction, or periosteal reaction. No avascular necrosis. No large knee or ankle joint effusion is seen.  Ligaments  Suboptimally visualized  Muscles and Tendons  There is diffuse fatty atrophy noted within the musculature surrounding the right lower extremity. No focal muscular tear or fluid collection. The tendon surrounding the lower extremity are intact. No evidence of tendinopathy or tear.  Soft tissues  There is areas of superficial skin irregularity/ulceration seen along the anterolateral aspect of the lower extremity with diffuse skin thickening and subcutaneous edema. No loculated fluid collections or sinus tract is seen.  Left:  Bones/Joint/Cartilage  Normal osseous marrow signal is seen  throughout. No areas of osteomyelitis, cortical destruction, or periosteal reaction. No avascular necrosis. No large knee or ankle joint effusion is seen.  Ligaments  Suboptimally visualized  Muscles and Tendons  There is diffuse fatty atrophy noted within the musculature surrounding the right lower extremity. No focal muscular tear or fluid collection. The tendon surrounding the lower extremity are intact. No evidence of tendinopathy or tear.  Soft tissues  There is areas of skin irregularity/ulceration seen along the anteromedial and anterolateral aspect of the lower extremity with diffuse skin thickening and subcutaneous edema. No loculated fluid collections or sinus tract is seen.  IMPRESSION: Areas of superficial ulceration surrounding both lower extremities with cellulitis. No evidence of abscess or sinus tract.  No evidence of osteomyelitis or acute osseous abnormality.  Discharge Instructions: Discharge Instructions    Diet - low sodium heart healthy   Complete by: As directed    Increase activity slowly   Complete by: As directed     Continue Comfort Care during her hospice stay.   Signed: Maudie Mercury, MD 04/15/2019, 1:54 PM   Pager: [phone]

## 2019-04-02 NOTE — Progress Notes (Signed)
VAST consulted to remove non-tunneled CVC; however, line is tunneled and must be removed by IR. IR unable to remove line today. VAST RN spoke with pt's nurse, Ulice Dash. Pt is being discharged today with comfort care; MD approved pt dc with line in place.

## 2019-04-02 NOTE — Progress Notes (Signed)
Tried to call husband Phyllis Hernandez but unable to reach him, no voice mail set up so couldn't leave any voice message.

## 2019-04-03 ENCOUNTER — Telehealth: Payer: Self-pay | Admitting: *Deleted

## 2019-04-03 NOTE — Telephone Encounter (Signed)
[facility] home will need DEATH CERT signed by dr winters, they are bringing it by clinic

## 2019-04-21 DEATH — deceased

## 2019-04-25 DIAGNOSIS — N179 Acute kidney failure, unspecified: Secondary | ICD-10-CM
